# Patient Record
Sex: Male | Born: 1950 | Race: Black or African American | Hispanic: No | Marital: Single | Smoking: Former smoker
Health system: Southern US, Community
[De-identification: ages and names within clinical notes are randomized; demographics above are authoritative.]

## PROBLEM LIST (undated history)

## (undated) DIAGNOSIS — K5792 Diverticulitis of intestine, part unspecified, without perforation or abscess without bleeding: Secondary | ICD-10-CM

## (undated) DIAGNOSIS — E119 Type 2 diabetes mellitus without complications: Secondary | ICD-10-CM

## (undated) DIAGNOSIS — I10 Essential (primary) hypertension: Secondary | ICD-10-CM

## (undated) DIAGNOSIS — Z720 Tobacco use: Secondary | ICD-10-CM

## (undated) DIAGNOSIS — C801 Malignant (primary) neoplasm, unspecified: Secondary | ICD-10-CM

## (undated) DIAGNOSIS — T7840XA Allergy, unspecified, initial encounter: Secondary | ICD-10-CM

## (undated) DIAGNOSIS — Z860100 Personal history of colon polyps, unspecified: Secondary | ICD-10-CM

## (undated) DIAGNOSIS — J45909 Unspecified asthma, uncomplicated: Secondary | ICD-10-CM

## (undated) DIAGNOSIS — K219 Gastro-esophageal reflux disease without esophagitis: Secondary | ICD-10-CM

## (undated) DIAGNOSIS — J449 Chronic obstructive pulmonary disease, unspecified: Secondary | ICD-10-CM

## (undated) DIAGNOSIS — I4891 Unspecified atrial fibrillation: Secondary | ICD-10-CM

## (undated) HISTORY — DX: Tobacco use: Z72.0

## (undated) HISTORY — PX: OTHER SURGICAL HISTORY: SHX169

## (undated) HISTORY — DX: Gastro-esophageal reflux disease without esophagitis: K21.9

## (undated) HISTORY — DX: Personal history of colon polyps, unspecified: Z86.0100

## (undated) HISTORY — DX: Essential (primary) hypertension: I10

## (undated) HISTORY — DX: Personal history of colonic polyps: Z86.010

## (undated) HISTORY — PX: COLONOSCOPY: SHX174

## (undated) HISTORY — DX: Diverticulitis of intestine, part unspecified, without perforation or abscess without bleeding: K57.92

## (undated) HISTORY — DX: Chronic obstructive pulmonary disease, unspecified: J44.9

## (undated) HISTORY — DX: Allergy, unspecified, initial encounter: T78.40XA

## (undated) HISTORY — DX: Unspecified atrial fibrillation: I48.91

---

## 2007-07-30 HISTORY — PX: COLON SURGERY: SHX602

## 2007-07-30 LAB — HM COLONOSCOPY

## 2010-10-24 ENCOUNTER — Ambulatory Visit: Payer: Self-pay | Admitting: Internal Medicine

## 2010-11-08 ENCOUNTER — Ambulatory Visit: Payer: Self-pay | Admitting: Internal Medicine

## 2010-12-12 ENCOUNTER — Ambulatory Visit: Payer: Self-pay | Admitting: Internal Medicine

## 2011-01-28 ENCOUNTER — Ambulatory Visit: Payer: Self-pay | Admitting: Internal Medicine

## 2011-06-17 ENCOUNTER — Telehealth: Payer: Self-pay | Admitting: Internal Medicine

## 2011-06-26 ENCOUNTER — Ambulatory Visit (INDEPENDENT_AMBULATORY_CARE_PROVIDER_SITE_OTHER): Payer: BC Managed Care – PPO | Admitting: Internal Medicine

## 2011-06-26 ENCOUNTER — Encounter: Payer: Self-pay | Admitting: Internal Medicine

## 2011-06-26 VITALS — BP 160/90 | HR 52 | Temp 97.2°F | Resp 16 | Ht 70.0 in | Wt 200.0 lb

## 2011-06-26 DIAGNOSIS — I10 Essential (primary) hypertension: Secondary | ICD-10-CM | POA: Insufficient documentation

## 2011-06-26 DIAGNOSIS — I428 Other cardiomyopathies: Secondary | ICD-10-CM

## 2011-06-26 MED ORDER — OLMESARTAN-AMLODIPINE-HCTZ 40-10-25 MG PO TABS: 1.0000 | ORAL_TABLET | Freq: Every day | ORAL | Status: DC

## 2011-06-26 NOTE — Assessment & Plan Note (Addendum)
He tells me that he was found to have an EF= 37 % on testing done at [facility] in the last few years, it sound like his PCP ordered a stress echo +/- nuclear study, there are no records available today though he signed and we sent a records request from his providers in [place], I would like for him to be rechecked by cardiology

## 2011-06-26 NOTE — Assessment & Plan Note (Signed)
His BP is not well controlled so I have asked him to stay on toprol and to add tribenzor to that for better BP control

## 2011-06-26 NOTE — Patient Instructions (Signed)

## 2011-06-26 NOTE — Progress Notes (Signed)
  Subjective:    Patient ID: [name] [name], male    DOB: Jul 19, 1951, 60 y.o.   MRN: [id number]  Hypertension This is a chronic problem. The current episode started more than 1 year ago. The problem has been gradually worsening since onset. The problem is uncontrolled. Pertinent negatives include no anxiety, blurred vision, chest pain, headaches, malaise/fatigue, neck pain, orthopnea, palpitations, peripheral edema, PND, shortness of breath or sweats. There are no associated agents to hypertension. Past treatments include beta blockers, diuretics and angiotensin blockers. The current treatment provides mild improvement. Compliance problems include exercise and diet.       Review of Systems  Constitutional: Negative for fever, chills, malaise/fatigue, diaphoresis, activity change, appetite change, fatigue and unexpected weight change.  HENT: Negative for sore throat, facial swelling, trouble swallowing, neck pain and voice change.   Eyes: Negative.  Negative for blurred vision.  Respiratory: Negative for chest tightness, shortness of breath, wheezing and stridor.   Cardiovascular: Negative for chest pain, palpitations, orthopnea, leg swelling and PND.  Gastrointestinal: Negative for nausea, vomiting, abdominal pain, diarrhea, constipation and abdominal distention.  Genitourinary: Negative for dysuria, urgency, frequency, hematuria, flank pain, decreased urine volume, enuresis and difficulty urinating.  Musculoskeletal: Negative for myalgias, back pain, joint swelling, arthralgias and gait problem.  Skin: Negative for color change, pallor, rash and wound.  Neurological: Negative for dizziness, tremors, seizures, syncope, facial asymmetry, speech difficulty, weakness, light-headedness, numbness and headaches.  Hematological: Negative for adenopathy. Does not bruise/bleed easily.  Psychiatric/Behavioral: Negative.        Objective:   Physical Exam  Vitals reviewed. Constitutional: He is  oriented to person, place, and time. He appears well-developed and well-nourished. No distress.  HENT:  Head: Normocephalic and atraumatic.  Mouth/Throat: Oropharynx is clear and moist. No oropharyngeal exudate.  Eyes: Conjunctivae are normal. Right eye exhibits no discharge. Left eye exhibits no discharge. No scleral icterus.  Neck: Normal range of motion. Neck supple. No JVD present. No tracheal deviation present. No thyromegaly present.  Cardiovascular: Normal rate, regular rhythm, normal heart sounds and intact distal pulses.  Exam reveals no gallop and no friction rub.   No murmur heard. Pulmonary/Chest: Effort normal and breath sounds normal. No stridor. No respiratory distress. He has no wheezes. He has no rales. He exhibits no tenderness.  Abdominal: Soft. Bowel sounds are normal. He exhibits no distension and no mass. There is no tenderness. There is no rebound and no guarding.  Musculoskeletal: Normal range of motion. He exhibits no edema and no tenderness.  Lymphadenopathy:    He has no cervical adenopathy.  Neurological: He is oriented to person, place, and time.  Skin: Skin is warm and dry. No rash noted. He is not diaphoretic. No erythema. No pallor.  Psychiatric: He has a normal mood and affect. His behavior is normal. Judgment and thought content normal.          Assessment & Plan:

## 2011-07-01 ENCOUNTER — Encounter: Payer: Self-pay | Admitting: Cardiology

## 2011-07-01 DIAGNOSIS — I429 Cardiomyopathy, unspecified: Secondary | ICD-10-CM | POA: Insufficient documentation

## 2011-07-01 DIAGNOSIS — K219 Gastro-esophageal reflux disease without esophagitis: Secondary | ICD-10-CM | POA: Insufficient documentation

## 2011-07-01 DIAGNOSIS — J449 Chronic obstructive pulmonary disease, unspecified: Secondary | ICD-10-CM | POA: Insufficient documentation

## 2011-07-02 ENCOUNTER — Institutional Professional Consult (permissible substitution): Payer: BC Managed Care – PPO | Admitting: Cardiology

## 2011-07-09 ENCOUNTER — Encounter: Payer: Self-pay | Admitting: Cardiology

## 2011-08-07 ENCOUNTER — Ambulatory Visit: Payer: BC Managed Care – PPO | Admitting: Internal Medicine

## 2011-08-16 ENCOUNTER — Ambulatory Visit: Payer: BC Managed Care – PPO | Admitting: Internal Medicine

## 2011-08-27 ENCOUNTER — Other Ambulatory Visit (INDEPENDENT_AMBULATORY_CARE_PROVIDER_SITE_OTHER): Payer: BC Managed Care – PPO

## 2011-08-27 ENCOUNTER — Ambulatory Visit (INDEPENDENT_AMBULATORY_CARE_PROVIDER_SITE_OTHER)
Admission: RE | Admit: 2011-08-27 | Discharge: 2011-08-27 | Disposition: A | Discharge status: Home / Self Care | Payer: BC Managed Care – PPO | Source: Ambulatory Visit | Attending: Internal Medicine | Admitting: Internal Medicine

## 2011-08-27 ENCOUNTER — Ambulatory Visit (INDEPENDENT_AMBULATORY_CARE_PROVIDER_SITE_OTHER): Payer: BC Managed Care – PPO | Admitting: Internal Medicine

## 2011-08-27 ENCOUNTER — Encounter: Payer: Self-pay | Admitting: Internal Medicine

## 2011-08-27 DIAGNOSIS — R05 Cough: Secondary | ICD-10-CM

## 2011-08-27 DIAGNOSIS — I428 Other cardiomyopathies: Secondary | ICD-10-CM

## 2011-08-27 DIAGNOSIS — I10 Essential (primary) hypertension: Secondary | ICD-10-CM

## 2011-08-27 DIAGNOSIS — J449 Chronic obstructive pulmonary disease, unspecified: Secondary | ICD-10-CM

## 2011-08-27 DIAGNOSIS — K219 Gastro-esophageal reflux disease without esophagitis: Secondary | ICD-10-CM

## 2011-08-27 DIAGNOSIS — R059 Cough, unspecified: Secondary | ICD-10-CM

## 2011-08-27 DIAGNOSIS — F17200 Nicotine dependence, unspecified, uncomplicated: Secondary | ICD-10-CM

## 2011-08-27 DIAGNOSIS — Z720 Tobacco use: Secondary | ICD-10-CM

## 2011-08-27 LAB — URINALYSIS, ROUTINE W REFLEX MICROSCOPIC
Bilirubin Urine: NEGATIVE
Nitrite: NEGATIVE
Urobilinogen, UA: 0.2 (ref 0.0–1.0)

## 2011-08-27 LAB — CBC WITH DIFFERENTIAL/PLATELET
Eosinophils Relative: 6.6 % — ABNORMAL HIGH (ref 0.0–5.0)
HCT: 38.4 % — ABNORMAL LOW (ref 39.0–52.0)
Hemoglobin: 13 g/dL (ref 13.0–17.0)
Lymphocytes Relative: 29.8 % (ref 12.0–46.0)
Lymphs Abs: 2.2 10*3/uL (ref 0.7–4.0)
Monocytes Relative: 8.6 % (ref 3.0–12.0)
Platelets: 227 10*3/uL (ref 150.0–400.0)
WBC: 7.3 10*3/uL (ref 4.5–10.5)

## 2011-08-27 LAB — COMPREHENSIVE METABOLIC PANEL
Albumin: 3.8 g/dL (ref 3.5–5.2)
CO2: 30 mEq/L (ref 19–32)
Calcium: 8.9 mg/dL (ref 8.4–10.5)
Chloride: 106 mEq/L (ref 96–112)
GFR: 67.59 mL/min (ref >60.00)
Glucose, Bld: 150 mg/dL — ABNORMAL HIGH (ref 70–99)
Sodium: 141 mEq/L (ref 135–145)
Total Bilirubin: 0.7 mg/dL (ref 0.3–1.2)
Total Protein: 7.6 g/dL (ref 6.0–8.3)

## 2011-08-27 LAB — LIPID PANEL
Total CHOL/HDL Ratio: 4
Triglycerides: 250 mg/dL — ABNORMAL HIGH (ref 0.0–149.0)

## 2011-08-27 MED ORDER — LOSARTAN POTASSIUM-HCTZ 100-25 MG PO TABS: 1.0000 | ORAL_TABLET | Freq: Every day | ORAL | Status: DC

## 2011-08-27 NOTE — Progress Notes (Signed)
Subjective:    Patient ID: [name] [name], male    DOB: 10-Nov-1950, 61 y.o.   MRN: [id number]  Hypertension This is a chronic problem. The current episode started more than 1 year ago. The problem has been gradually improving since onset. The problem is controlled. Pertinent negatives include no anxiety, blurred vision, chest pain, headaches, malaise/fatigue, neck pain, orthopnea, palpitations, peripheral edema, PND, shortness of breath or sweats. There are no associated agents to hypertension. Past treatments include angiotensin blockers, beta blockers and diuretics. The current treatment provides moderate improvement. Compliance problems include exercise and diet.   Cough This is a chronic problem. The current episode started more than 1 month ago. The problem has been unchanged. The problem occurs every few hours. The cough is non-productive. Pertinent negatives include no chest pain, chills, ear congestion, ear pain, fever, headaches, heartburn, hemoptysis, myalgias, nasal congestion, postnasal drip, rash, rhinorrhea, sore throat, shortness of breath, sweats, weight loss or wheezing. Risk factors for lung disease include smoking/tobacco exposure. He has tried nothing for the symptoms. His past medical history is significant for COPD.      Review of Systems  Constitutional: Negative for fever, chills, weight loss, malaise/fatigue, diaphoresis, activity change, appetite change, fatigue and unexpected weight change.  HENT: Negative.  Negative for ear pain, sore throat, rhinorrhea, neck pain and postnasal drip.   Eyes: Negative.  Negative for blurred vision.  Respiratory: Positive for cough. Negative for apnea, hemoptysis, choking, chest tightness, shortness of breath, wheezing and stridor.   Cardiovascular: Negative for chest pain, palpitations, orthopnea, leg swelling and PND.  Gastrointestinal: Negative for heartburn, nausea, vomiting, abdominal pain, diarrhea, constipation and anal bleeding.    Genitourinary: Negative.   Musculoskeletal: Negative for myalgias, back pain, joint swelling, arthralgias and gait problem.  Skin: Negative for color change, pallor, rash and wound.  Neurological: Negative for dizziness, tremors, seizures, syncope, facial asymmetry, speech difficulty, weakness, light-headedness, numbness and headaches.  Hematological: Negative for adenopathy. Does not bruise/bleed easily.  Psychiatric/Behavioral: Negative.        Objective:   Physical Exam  Vitals reviewed. Constitutional: He is oriented to person, place, and time. He appears well-developed and well-nourished. No distress.  HENT:  Head: Normocephalic and atraumatic.  Mouth/Throat: Oropharynx is clear and moist. No oropharyngeal exudate.  Eyes: Conjunctivae are normal. Right eye exhibits no discharge. Left eye exhibits no discharge. No scleral icterus.  Neck: Normal range of motion. Neck supple. No JVD present. No tracheal deviation present. No thyromegaly present.  Cardiovascular: Normal rate, regular rhythm, normal heart sounds and intact distal pulses.  Exam reveals no gallop and no friction rub.   No murmur heard. Pulmonary/Chest: Effort normal and breath sounds normal. No stridor. No respiratory distress. He has no wheezes. He has no rales. He exhibits no tenderness.  Abdominal: Soft. Bowel sounds are normal. He exhibits no distension and no mass. There is no tenderness. There is no rebound and no guarding.  Musculoskeletal: Normal range of motion. He exhibits no edema and no tenderness.  Lymphadenopathy:    He has no cervical adenopathy.  Neurological: He is oriented to person, place, and time.  Skin: Skin is warm and dry. No rash noted. He is not diaphoretic. No erythema. No pallor.  Psychiatric: He has a normal mood and affect. His behavior is normal. Judgment and thought content normal.      Lab Results  Component Value Date   WBC 7.3 08/27/2011   HGB 13.0 08/27/2011   HCT 38.4* 08/27/2011    PLT  227.0 08/27/2011   GLUCOSE 150* 08/27/2011   CHOL 168 08/27/2011   TRIG 250.0* 08/27/2011   HDL 38.60* 08/27/2011   LDLDIRECT 98.4 08/27/2011   ALT 15 08/27/2011   AST 18 08/27/2011   NA 141 08/27/2011   K 3.7 08/27/2011   CL 106 08/27/2011   CREATININE 1.4 08/27/2011   BUN 18 08/27/2011   CO2 30 08/27/2011   TSH 1.64 08/27/2011      Assessment & Plan:

## 2011-08-27 NOTE — Patient Instructions (Signed)
Smoking Cessation This document explains the best ways for you to quit smoking and new treatments to help. It lists new medicines that can double or triple your chances of quitting and quitting for good. It also considers ways to avoid relapses and concerns you may have about quitting, including weight gain. NICOTINE: A POWERFUL ADDICTION If you have tried to quit smoking, you know how hard it can be. It is hard because nicotine is a very addictive drug. For some people, it can be as addictive as heroin or cocaine. Usually, people make 2 or 3 tries, or more, before finally being able to quit. Each time you try to quit, you can learn about what helps and what hurts. Quitting takes hard work and a lot of effort, but you can quit smoking. QUITTING SMOKING IS ONE OF THE MOST IMPORTANT THINGS YOU WILL EVER DO.  You will live longer, feel better, and live better.   The impact on your body of quitting smoking is felt almost immediately:   Within 20 minutes, blood pressure decreases. Pulse returns to its normal level.   After 8 hours, carbon monoxide levels in the blood return to normal. Oxygen level increases.   After 24 hours, chance of heart attack starts to decrease. Breath, hair, and body stop smelling like smoke.   After 48 hours, damaged nerve endings begin to recover. Sense of taste and smell improve.   After 72 hours, the body is virtually free of nicotine. Bronchial tubes relax and breathing becomes easier.   After 2 to 12 weeks, lungs can hold more air. Exercise becomes easier and circulation improves.   Quitting will reduce your risk of having a heart attack, stroke, cancer, or lung disease:   After 1 year, the risk of coronary heart disease is cut in half.   After 5 years, the risk of stroke falls to the same as a nonsmoker.   After 10 years, the risk of lung cancer is cut in half and the risk of other cancers decreases significantly.   After 15 years, the risk of coronary heart  disease drops, usually to the level of a nonsmoker.   If you are pregnant, quitting smoking will improve your chances of having a healthy baby.   The people you live with, especially your children, will be healthier.   You will have extra money to spend on things other than cigarettes.  FIVE KEYS TO QUITTING Studies have shown that these 5 steps will help you quit smoking and quit for good. You have the best chances of quitting if you use them together: 1. Get ready.  2. Get support and encouragement.  3. Learn new skills and behaviors.  4. Get medicine to reduce your nicotine addiction and use it correctly.  5. Be prepared for relapse or difficult situations. Be determined to continue trying to quit, even if you do not succeed at first.  1. GET READY  Set a quit date.   Change your environment.   Get rid of ALL cigarettes, ashtrays, matches, and lighters in your home, car, and place of work.   Do not let people smoke in your home.   Review your past attempts to quit. Think about what worked and what did not.   Once you quit, do not smoke. NOT EVEN A PUFF!  2. GET SUPPORT AND ENCOURAGEMENT Studies have shown that you have a better chance of being successful if you have help. You can get support in many ways.  Tell   your family, friends, and coworkers that you are going to quit and need their support. Ask them not to smoke around you.   Talk to your caregivers (doctor, dentist, nurse, pharmacist, psychologist, and/or smoking counselor).   Get individual, group, or telephone counseling and support. The more counseling you have, the better your chances are of quitting. Programs are available at local hospitals and health centers. Call your local health department for information about programs in your area.   Spiritual beliefs and practices may help some smokers quit.   Quit meters are small computer programs online or downloadable that keep track of quit statistics, such as amount  of "quit-time," cigarettes not smoked, and money saved.   Many smokers find one or more of the many self-help books available useful in helping them quit and stay off tobacco.  3. LEARN NEW SKILLS AND BEHAVIORS  Try to distract yourself from urges to smoke. Talk to someone, go for a walk, or occupy your time with a task.   When you first try to quit, change your routine. Take a different route to work. Drink tea instead of coffee. Eat breakfast in a different place.   Do something to reduce your stress. Take a hot bath, exercise, or read a book.   Plan something enjoyable to do every day. Reward yourself for not smoking.   Explore interactive web-based programs that specialize in helping you quit.  4. GET MEDICINE AND USE IT CORRECTLY Medicines can help you stop smoking and decrease the urge to smoke. Combining medicine with the above behavioral methods and support can quadruple your chances of successfully quitting smoking. The [place] Food and Drug Administration (FDA) has approved 7 medicines to help you quit smoking. These medicines fall into 3 categories.  Nicotine replacement therapy (delivers nicotine to your body without the negative effects and risks of smoking):   Nicotine gum: Available over-the-counter.   Nicotine lozenges: Available over-the-counter.   Nicotine inhaler: Available by prescription.   Nicotine nasal spray: Available by prescription.   Nicotine skin patches (transdermal): Available by prescription and over-the-counter.   Antidepressant medicine (helps people abstain from smoking, but how this works is unknown):   Bupropion sustained-release (SR) tablets: Available by prescription.   Nicotinic receptor partial agonist (simulates the effect of nicotine in your brain):   Varenicline tartrate tablets: Available by prescription.   Ask your caregiver for advice about which medicines to use and how to use them. Carefully read the information on the package.    Everyone who is trying to quit may benefit from using a medicine. If you are pregnant or trying to become pregnant, nursing an infant, you are under age 18, or you smoke fewer than 10 cigarettes per day, talk to your caregiver before taking any nicotine replacement medicines.   You should stop using a nicotine replacement product and call your caregiver if you experience nausea, dizziness, weakness, vomiting, fast or irregular heartbeat, mouth problems with the lozenge or gum, or redness or swelling of the skin around the patch that does not go away.   Do not use any other product containing nicotine while using a nicotine replacement product.   Talk to your caregiver before using these products if you have diabetes, heart disease, asthma, stomach ulcers, you had a recent heart attack, you have high blood pressure that is not controlled with medicine, a history of irregular heartbeat, or you have been prescribed medicine to help you quit smoking.  5. BE PREPARED FOR RELAPSE OR   DIFFICULT SITUATIONS  Most relapses occur within the first 3 months after quitting. Do not be discouraged if you start smoking again. Remember, most people try several times before they finally quit.   You may have symptoms of withdrawal because your body is used to nicotine. You may crave cigarettes, be irritable, feel very hungry, cough often, get headaches, or have difficulty concentrating.   The withdrawal symptoms are only temporary. They are strongest when you first quit, but they will go away within 10 to 14 days.  Here are some difficult situations to watch for:  Alcohol. Avoid drinking alcohol. Drinking lowers your chances of successfully quitting.   Caffeine. Try to reduce the amount of caffeine you consume. It also lowers your chances of successfully quitting.   Other smokers. Being around smoking can make you want to smoke. Avoid smokers.   Weight gain. Many smokers will gain weight when they quit, usually  less than 10 pounds. Eat a healthy diet and stay active. Do not let weight gain distract you from your main goal, quitting smoking. Some medicines that help you quit smoking may also help delay weight gain. You can always lose the weight gained after you quit.   Bad mood or depression. There are a lot of ways to improve your mood other than smoking.  If you are having problems with any of these situations, talk to your caregiver. SPECIAL SITUATIONS AND CONDITIONS Studies suggest that everyone can quit smoking. Your situation or condition can give you a special reason to quit.  Pregnant women/new mothers: By quitting, you protect your baby's health and your own.   Hospitalized patients: By quitting, you reduce health problems and help healing.   Heart attack patients: By quitting, you reduce your risk of a second heart attack.   Lung, head, and neck cancer patients: By quitting, you reduce your chance of a second cancer.   Parents of children and adolescents: By quitting, you protect your children from illnesses caused by secondhand smoke.  QUESTIONS TO THINK ABOUT Think about the following questions before you try to stop smoking. You may want to talk about your answers with your caregiver.  Why do you want to quit?   If you tried to quit in the past, what helped and what did not?   What will be the most difficult situations for you after you quit? How will you plan to handle them?   Who can help you through the tough times? Your family? Friends? Caregiver?   What pleasures do you get from smoking? What ways can you still get pleasure if you quit?  Here are some questions to ask your caregiver:  How can you help me to be successful at quitting?   What medicine do you think would be best for me and how should I take it?   What should I do if I need more help?   What is smoking withdrawal like? How can I get information on withdrawal?  Quitting takes hard work and a lot of effort,  but you can quit smoking. FOR MORE INFORMATION  Smokefree.gov (http://www.smokefree.gov) provides free, accurate, evidence-based information and professional assistance to help support the immediate and long-term needs of people trying to quit smoking. Document Released: 07/09/2001 Document Revised: 03/27/2011 Document Reviewed: 05/01/2009 [employer] Patient Information 2012 [employer]. Hypertension As your heart beats, it forces blood through your arteries. This force is your blood pressure. If the pressure is too high, it is called hypertension (HTN) or high blood   pressure. HTN is dangerous because you may have it and not know it. High blood pressure may mean that your heart has to work harder to pump blood. Your arteries may be narrow or stiff. The extra work puts you at risk for heart disease, stroke, and other problems.  Blood pressure consists of two numbers, a higher number over a lower, 110/72, for example. It is stated as "110 over 72." The ideal is below 120 for the top number (systolic) and under 80 for the bottom (diastolic). Write down your blood pressure today. You should pay close attention to your blood pressure if you have certain conditions such as:  Heart failure.   Prior heart attack.   Diabetes   Chronic kidney disease.   Prior stroke.   Multiple risk factors for heart disease.  To see if you have HTN, your blood pressure should be measured while you are seated with your arm held at the level of the heart. It should be measured at least twice. A one-time elevated blood pressure reading (especially in the Emergency Department) does not mean that you need treatment. There may be conditions in which the blood pressure is different between your right and left arms. It is important to see your caregiver soon for a recheck. Most people have essential hypertension which means that there is not a specific cause. This type of high blood pressure may be lowered by changing  lifestyle factors such as:  Stress.   Smoking.   Lack of exercise.   Excessive weight.   Drug/tobacco/alcohol use.   Eating less salt.  Most people do not have symptoms from high blood pressure until it has caused damage to the body. Effective treatment can often prevent, delay or reduce that damage. TREATMENT  When a cause has been identified, treatment for high blood pressure is directed at the cause. There are a large number of medications to treat HTN. These fall into several categories, and your caregiver will help you select the medicines that are best for you. Medications may have side effects. You should review side effects with your caregiver. If your blood pressure stays high after you have made lifestyle changes or started on medicines,   Your medication(s) may need to be changed.   Other problems may need to be addressed.   Be certain you understand your prescriptions, and know how and when to take your medicine.   Be sure to follow up with your caregiver within the time frame advised (usually within two weeks) to have your blood pressure rechecked and to review your medications.   If you are taking more than one medicine to lower your blood pressure, make sure you know how and at what times they should be taken. Taking two medicines at the same time can result in blood pressure that is too low.  SEEK IMMEDIATE MEDICAL CARE IF:  You develop a severe headache, blurred or changing vision, or confusion.   You have unusual weakness or numbness, or a faint feeling.   You have severe chest or abdominal pain, vomiting, or breathing problems.  MAKE SURE YOU:   Understand these instructions.   Will watch your condition.   Will get help right away if you are not doing well or get worse.  Document Released: 07/15/2005 Document Revised: 03/27/2011 Document Reviewed: 03/04/2008 Exit[employer] Patient Information 2012 [employer]. 

## 2011-08-28 ENCOUNTER — Telehealth: Payer: Self-pay | Admitting: Internal Medicine

## 2011-08-28 NOTE — Telephone Encounter (Signed)
Received copies from Dr. Eulas Post at [facility] 08/28/11. Forwarded  68pages to Dr. Leretha Dykes review.

## 2011-08-29 ENCOUNTER — Telehealth: Payer: Self-pay

## 2011-08-29 MED ORDER — METOPROLOL SUCCINATE ER 50 MG PO TB24: 50.0000 mg | ORAL_TABLET | Freq: Every day | ORAL | Status: DC

## 2011-08-29 MED ORDER — OMEPRAZOLE 20 MG PO CPDR: 20.0000 mg | DELAYED_RELEASE_CAPSULE | Freq: Every day | ORAL | Status: DC

## 2011-08-29 NOTE — Assessment & Plan Note (Signed)
I will check a CXR today to look for mass, pna, edema, etc.

## 2011-08-29 NOTE — Telephone Encounter (Signed)
Patient called [facility] needing to know if medical records and been received. Returned call to patient and advised yes. Patient then request rx for omeprazole 20 qd and metoprolol 50 mg qd to be sent to [facility]

## 2011-08-29 NOTE — Assessment & Plan Note (Signed)
He agrees to use nicotine patches to quit smoking 

## 2011-08-29 NOTE — Assessment & Plan Note (Signed)
BP is well controlled, I will check his lytes and renal function 

## 2011-08-29 NOTE — Assessment & Plan Note (Signed)
He does not want to start using inhalers but he does agree to quit smoking

## 2011-08-30 ENCOUNTER — Institutional Professional Consult (permissible substitution): Payer: BC Managed Care – PPO | Admitting: Cardiology

## 2011-10-02 ENCOUNTER — Encounter: Payer: Self-pay | Admitting: Cardiology

## 2011-10-03 ENCOUNTER — Institutional Professional Consult (permissible substitution): Payer: BC Managed Care – PPO | Admitting: Cardiology

## 2011-11-26 ENCOUNTER — Ambulatory Visit: Payer: BC Managed Care – PPO | Admitting: Internal Medicine

## 2011-12-03 ENCOUNTER — Ambulatory Visit: Payer: BC Managed Care – PPO | Admitting: Internal Medicine

## 2012-01-06 ENCOUNTER — Other Ambulatory Visit: Payer: Self-pay | Admitting: Internal Medicine

## 2012-01-21 ENCOUNTER — Telehealth: Payer: Self-pay

## 2012-01-21 MED ORDER — OMEPRAZOLE 20 MG PO CPDR: DELAYED_RELEASE_CAPSULE | ORAL | Status: DC

## 2012-01-21 NOTE — Telephone Encounter (Signed)
Received fax from pharmacy stating that insurance will only cover 90 day supply

## 2012-04-20 ENCOUNTER — Ambulatory Visit: Payer: BC Managed Care – PPO | Admitting: Internal Medicine

## 2012-04-20 DIAGNOSIS — Z0289 Encounter for other administrative examinations: Secondary | ICD-10-CM

## 2012-05-01 ENCOUNTER — Emergency Department (HOSPITAL_COMMUNITY)
Admission: EM | Admit: 2012-05-01 | Discharge: 2012-05-01 | Disposition: A | Discharge status: Home / Self Care | Payer: BC Managed Care – PPO | Attending: Emergency Medicine | Admitting: Emergency Medicine

## 2012-05-01 ENCOUNTER — Emergency Department (HOSPITAL_COMMUNITY): Payer: BC Managed Care – PPO

## 2012-05-01 ENCOUNTER — Encounter (HOSPITAL_COMMUNITY): Payer: Self-pay | Admitting: Emergency Medicine

## 2012-05-01 DIAGNOSIS — J189 Pneumonia, unspecified organism: Secondary | ICD-10-CM | POA: Insufficient documentation

## 2012-05-01 DIAGNOSIS — J45901 Unspecified asthma with (acute) exacerbation: Secondary | ICD-10-CM

## 2012-05-01 DIAGNOSIS — I10 Essential (primary) hypertension: Secondary | ICD-10-CM | POA: Insufficient documentation

## 2012-05-01 DIAGNOSIS — E876 Hypokalemia: Secondary | ICD-10-CM | POA: Insufficient documentation

## 2012-05-01 DIAGNOSIS — R0602 Shortness of breath: Secondary | ICD-10-CM | POA: Insufficient documentation

## 2012-05-01 HISTORY — DX: Unspecified asthma, uncomplicated: J45.909

## 2012-05-01 LAB — BASIC METABOLIC PANEL
Calcium: 9.8 mg/dL (ref 8.4–10.5)
GFR calc Af Amer: 70 mL/min — ABNORMAL LOW (ref >90)
GFR calc non Af Amer: 61 mL/min — ABNORMAL LOW (ref >90)
Glucose, Bld: 140 mg/dL — ABNORMAL HIGH (ref 70–99)
Potassium: 2.6 mEq/L — CL (ref 3.5–5.1)
Sodium: 137 mEq/L (ref 135–145)

## 2012-05-01 LAB — CBC WITH DIFFERENTIAL/PLATELET
Basophils Absolute: 0 10*3/uL (ref 0.0–0.1)
Basophils Relative: 0 % (ref 0–1)
Eosinophils Absolute: 0.4 10*3/uL (ref 0.0–0.7)
Eosinophils Relative: 5 % (ref 0–5)
Lymphs Abs: 2.3 10*3/uL (ref 0.7–4.0)
MCH: 32.2 pg (ref 26.0–34.0)
MCV: 95.6 fL (ref 78.0–100.0)
Neutrophils Relative %: 64 % (ref 43–77)
Platelets: 249 10*3/uL (ref 150–400)
RBC: 4.54 MIL/uL (ref 4.22–5.81)
RDW: 13.4 % (ref 11.5–15.5)

## 2012-05-01 LAB — TROPONIN I: Troponin I: <0.3 ng/mL (ref <0.30)

## 2012-05-01 MED ORDER — IPRATROPIUM BROMIDE 0.02 % IN SOLN
RESPIRATORY_TRACT | Status: AC
  Administered 2012-05-01: 0.5 mg
  Filled 2012-05-01: qty 2.5

## 2012-05-01 MED ORDER — ALBUTEROL SULFATE (5 MG/ML) 0.5% IN NEBU
INHALATION_SOLUTION | RESPIRATORY_TRACT | Status: AC
  Administered 2012-05-01: 5 mg
  Filled 2012-05-01: qty 1

## 2012-05-01 MED ORDER — PREDNISONE 20 MG PO TABS
40.0000 mg | ORAL_TABLET | Freq: Once | ORAL | Status: AC
  Administered 2012-05-01: 40 mg via ORAL
  Filled 2012-05-01: qty 2

## 2012-05-01 MED ORDER — POTASSIUM CHLORIDE CRYS ER 20 MEQ PO TBCR: 20.0000 meq | EXTENDED_RELEASE_TABLET | Freq: Two times a day (BID) | ORAL | Status: DC

## 2012-05-01 MED ORDER — AZITHROMYCIN 250 MG PO TABS: 250.0000 mg | ORAL_TABLET | Freq: Every day | ORAL | Status: DC

## 2012-05-01 MED ORDER — PREDNISONE 20 MG PO TABS: 40.0000 mg | ORAL_TABLET | Freq: Every day | ORAL | Status: DC

## 2012-05-01 NOTE — ED Notes (Signed)
MD at bedside. 

## 2012-05-01 NOTE — ED Notes (Signed)
Patient reports asthma exacerbation that started two hours ago; patient reports that his albuterol inhaler has not been helping.  Paramedics called out to home yesterday for another asthma attack.  Audible wheezing heard in triage.

## 2012-05-01 NOTE — ED Provider Notes (Addendum)
History     CSN: [id number]  Arrival date & time 05/01/12  8413   First MD Initiated Contact with Patient 05/01/12 [phone]      Chief Complaint  Patient presents with  . Asthma    (Consider location/radiation/quality/duration/timing/severity/associated sxs/prior treatment) HPI Comments: Pt has hx of Non ischemic cardiomyopathy and hx of COPD with long history of smoking who presents with recurrent SOB and wheezing.  He states that when he went to bed last night he was having mild wheezing but awoke this morning with increased cough and wheeze.  He denies fevers, CP, cough, swelling or headache.  Has no sore throat or congestion.  Sx are persistent and didn't improve much with MDI tx prior to arrival but felt better after EMS neb. According to the MR the pt had recent dx of COPD with suggestion of using MDI as of earlier this year.  He has needed intermittent MDI's over the last 9 months.  Patient is a 61 y.o. male presenting with asthma. The history is provided by the patient and medical records.  Asthma    Past Medical History  Diagnosis Date  . GERD (gastroesophageal reflux disease)   . Allergy   . Diverticulitis   . History of colon polyps   . COPD (chronic obstructive pulmonary disease)   . Cardiomyopathy 2011    Patient was told that he had an ejection fraction of 37% by echo done elsewhere in 2011  . Hypertension   . Tobacco abuse   . Asthma     Past Surgical History  Procedure Date  . Colon surgery 2009    Family History  Problem Relation Age of Onset  . Arthritis Other     parents  . Cancer Other     Colon,Breast, Prostate Cancer  . Hypertension Other   . Diabetes Other     parents  . Colon cancer Mother   . Prostate cancer Father     History  Substance Use Topics  . Smoking status: Current Every Day Smoker -- 0.5 packs/day for 25 years    Types: Cigarettes  . Smokeless tobacco: Never Used  . Alcohol Use: 7.2 oz/week    12 Shots of liquor per week       Review of Systems  All other systems reviewed and are negative.    Allergies  Lisinopril; Penicillins; and Tribenzor  Home Medications   Current Outpatient Rx  Name Route Sig Dispense Refill  . ASPIRIN 81 MG PO TABS Oral Take 81 mg by mouth daily.      . OMEGA-3 FATTY ACIDS 1000 MG PO CAPS Oral Take 2 g by mouth daily.      Marland Kitchen HYDROCHLOROTHIAZIDE 25 MG PO TABS Oral Take 25 mg by mouth daily.    Marland Kitchen LOSARTAN POTASSIUM 100 MG PO TABS Oral Take 100 mg by mouth daily.    Marland Kitchen METOPROLOL SUCCINATE ER 50 MG PO TB24 Oral Take 1 tablet (50 mg total) by mouth daily. Take with or immediately following a meal. 30 tablet 11  . ONE-DAILY MULTI VITAMINS PO TABS Oral Take 1 tablet by mouth daily.      Marland Kitchen OMEPRAZOLE 20 MG PO CPDR Oral Take 20 mg by mouth daily.    . AZITHROMYCIN 250 MG PO TABS Oral Take 1 tablet (250 mg total) by mouth daily. 500mg  PO day 1, then 250mg  PO days 205 6 tablet 0  . POTASSIUM CHLORIDE CRYS ER 20 MEQ PO TBCR Oral Take 1 tablet (20 mEq  total) by mouth 2 (two) times daily. 20 tablet 0  . PREDNISONE 20 MG PO TABS Oral Take 2 tablets (40 mg total) by mouth daily. 10 tablet 0    BP 173/86  Pulse 82  Temp 98.2 F (36.8 C) (Oral)  Resp 24  SpO2 94%  Physical Exam  Nursing note and vitals reviewed. Constitutional: He appears well-developed and well-nourished. No distress.  HENT:  Head: Normocephalic and atraumatic.  Mouth/Throat: Oropharynx is clear and moist. No oropharyngeal exudate.  Eyes: Conjunctivae normal and EOM are normal. Pupils are equal, round, and reactive to light. Right eye exhibits no discharge. Left eye exhibits no discharge. No scleral icterus.  Neck: Normal range of motion. Neck supple. No JVD present. No thyromegaly present.  Cardiovascular: Normal rate, regular rhythm, normal heart sounds and intact distal pulses.  Exam reveals no gallop and no friction rub.   No murmur heard. Pulmonary/Chest: Effort normal. No respiratory distress. He has wheezes  ( mild end expiratory wheezing). He has no rales.  Abdominal: Soft. Bowel sounds are normal. He exhibits no distension and no mass. There is no tenderness.  Musculoskeletal: Normal range of motion. He exhibits no edema and no tenderness.  Lymphadenopathy:    He has no cervical adenopathy.  Neurological: He is alert. Coordination normal.  Skin: Skin is warm and dry. No rash noted. No erythema.  Psychiatric: He has a normal mood and affect. His behavior is normal.    ED Course  Procedures (including critical care time)  Labs Reviewed  PRO B NATRIURETIC PEPTIDE - Abnormal; Notable for the following:    Pro B Natriuretic peptide (BNP) 2736.0 (*)     All other components within normal limits  BASIC METABOLIC PANEL - Abnormal; Notable for the following:    Potassium 2.6 (*)     Glucose, Bld 140 (*)     GFR calc non Af Amer 61 (*)     GFR calc Af Amer 70 (*)     All other components within normal limits  CBC WITH DIFFERENTIAL  TROPONIN I   Dg Chest 2 View  05/01/2012  *RADIOLOGY REPORT*  Clinical Data: Asthma  CHEST - 2 VIEW  Comparison: 08/27/2011  Findings: Heart size upper normal.  Aortic arch atherosclerosis. Mild lingular opacity.  Mild interstitial coarsening/bronchitic change.  No pleural effusion or pneumothorax.  No acute osseous finding.  IMPRESSION: Heart size upper normal.  Mild bronchitic change may reflect sequelae of smoking or asthma.  Mild lingular opacity is nonspecific; scarring, atelectasis, or infiltrate.   Original Report Authenticated By: Waneta Martins, M.D.      1. Asthma exacerbation   2. Community acquired pneumonia   3. Hypokalemia       MDM  Currently the pt is speaking in full sentences, has mild end expiratory wheezing after 2 nebs.  Possibility of cardiac wheeze, check CXR and BNP, no JVD and no peripheral edema.  ECG is unremarkable but no old ECG with which to compare.  ED ECG REPORT  I personally interpreted this EKG   Date: 05/01/2012    Rate: 80  Rhythm: normal sinus rhythm  QRS Axis: left  Intervals: QT prolonged  ST/T Wave abnormalities: LVH with repolarization abnormality  Conduction Disutrbances:nonspecific intraventricular conduction delay  Narrative Interpretation:   Old EKG Reviewed: none available   I have had a long discussion with the patient and his friend whom he lives with re: the cause of his SOB.  I see no pulmonary edema on the  xray and the ECG shows no acute ischemia though there is some LVH.  I have reviewed the MR and find no old Echo's and no stress tests though he reports that in the last 3 years he had a stress echo followed by a cath that showed non ischemic cardiomyopathy and non obstructive CAD.    I have informed him several times very clearly that my recommendation is admission for Echo and further testing and he has declined stating he wants to pursue this as outpt - he has improved with meds and is amenable to f/u and has appt in 4 days with PMD.  He has expressed his understanding that this may be his heart but is adamant about not being admitted.    Will treat as outpt at pts request - albuterol, zithromax, prednisone, potassium.  Pt also has prolonged QT but wants to be d/c.  He is aware that he should come back if sx worsen.     Vida Roller, MD 05/01/12 [id number]  Vida Roller, MD 05/01/12 [phone]

## 2012-05-01 NOTE — ED Notes (Signed)
Pt discharged home. Had no further questions at the time. Vital signs stable. Pt encouraged to follow up with PCP.

## 2012-05-05 ENCOUNTER — Ambulatory Visit (INDEPENDENT_AMBULATORY_CARE_PROVIDER_SITE_OTHER)
Admission: RE | Admit: 2012-05-05 | Discharge: 2012-05-05 | Disposition: A | Discharge status: Home / Self Care | Payer: BC Managed Care – PPO | Source: Ambulatory Visit | Attending: Internal Medicine | Admitting: Internal Medicine

## 2012-05-05 ENCOUNTER — Other Ambulatory Visit (INDEPENDENT_AMBULATORY_CARE_PROVIDER_SITE_OTHER): Payer: BC Managed Care – PPO

## 2012-05-05 ENCOUNTER — Encounter: Payer: Self-pay | Admitting: Internal Medicine

## 2012-05-05 ENCOUNTER — Ambulatory Visit (INDEPENDENT_AMBULATORY_CARE_PROVIDER_SITE_OTHER): Payer: BC Managed Care – PPO | Admitting: Internal Medicine

## 2012-05-05 VITALS — BP 146/82 | HR 66 | Temp 98.4°F | Resp 16 | Wt 203.5 lb

## 2012-05-05 DIAGNOSIS — I429 Cardiomyopathy, unspecified: Secondary | ICD-10-CM

## 2012-05-05 DIAGNOSIS — R7309 Other abnormal glucose: Secondary | ICD-10-CM | POA: Insufficient documentation

## 2012-05-05 DIAGNOSIS — I10 Essential (primary) hypertension: Secondary | ICD-10-CM

## 2012-05-05 DIAGNOSIS — E782 Mixed hyperlipidemia: Secondary | ICD-10-CM

## 2012-05-05 DIAGNOSIS — E785 Hyperlipidemia, unspecified: Secondary | ICD-10-CM | POA: Insufficient documentation

## 2012-05-05 DIAGNOSIS — E876 Hypokalemia: Secondary | ICD-10-CM

## 2012-05-05 DIAGNOSIS — R05 Cough: Secondary | ICD-10-CM

## 2012-05-05 DIAGNOSIS — J449 Chronic obstructive pulmonary disease, unspecified: Secondary | ICD-10-CM

## 2012-05-05 DIAGNOSIS — I428 Other cardiomyopathies: Secondary | ICD-10-CM

## 2012-05-05 LAB — BASIC METABOLIC PANEL
Calcium: 9 mg/dL (ref 8.4–10.5)
GFR: 80.91 mL/min (ref >60.00)
Potassium: 3.8 mEq/L (ref 3.5–5.1)
Sodium: 137 mEq/L (ref 135–145)

## 2012-05-05 LAB — HEMOGLOBIN A1C: Hgb A1c MFr Bld: 7.1 % — ABNORMAL HIGH (ref 4.6–6.5)

## 2012-05-05 LAB — LIPID PANEL
Cholesterol: 183 mg/dL (ref 0–200)
Total CHOL/HDL Ratio: 4
Triglycerides: 247 mg/dL — ABNORMAL HIGH (ref 0.0–149.0)

## 2012-05-05 LAB — MAGNESIUM: Magnesium: 1.7 mg/dL (ref 1.5–2.5)

## 2012-05-05 MED ORDER — BUDESONIDE-FORMOTEROL FUMARATE 160-4.5 MCG/ACT IN AERO: 2.0000 | INHALATION_SPRAY | Freq: Two times a day (BID) | RESPIRATORY_TRACT | Status: DC

## 2012-05-05 MED ORDER — TRIAMTERENE-HCTZ 37.5-25 MG PO CAPS: 1.0000 | ORAL_CAPSULE | ORAL | Status: DC

## 2012-05-05 MED ORDER — OLMESARTAN MEDOXOMIL 40 MG PO TABS: 40.0000 mg | ORAL_TABLET | Freq: Every day | ORAL | Status: DC

## 2012-05-05 NOTE — Assessment & Plan Note (Signed)
He will try to quit smoking, I have asked him to start Symbicort

## 2012-05-05 NOTE — Progress Notes (Signed)
Subjective:    Patient ID: [name] [name], male    DOB: [birth year], 61 y.o.   MRN: [id number]  Hypertension This is a chronic problem. The current episode started more than 1 year ago. The problem has been gradually worsening since onset. The problem is uncontrolled. Associated symptoms include malaise/fatigue and shortness of breath. Pertinent negatives include no anxiety, blurred vision, chest pain, headaches, neck pain, orthopnea, palpitations, peripheral edema, PND or sweats. Past treatments include diuretics, beta blockers and angiotensin blockers. The current treatment provides mild improvement. Compliance problems include exercise and diet.  Hypertensive end-organ damage includes heart failure and left ventricular hypertrophy.  Asthma He complains of cough, shortness of breath, sputum production and wheezing. There is no chest tightness, difficulty breathing, frequent throat clearing, hemoptysis or hoarse voice. This is a recurrent problem. The current episode started more than 1 month ago. The problem occurs intermittently. The problem has been gradually improving. The cough is productive of purulent sputum. Associated symptoms include malaise/fatigue. Pertinent negatives include no appetite change, chest pain, dyspnea on exertion, ear congestion, ear pain, fever, headaches, heartburn, myalgias, nasal congestion, orthopnea, PND, postnasal drip, rhinorrhea, sneezing, sore throat, sweats, trouble swallowing or weight loss. His symptoms are aggravated by pollen. His symptoms are alleviated by beta-agonist and oral steroids. He reports moderate improvement on treatment. His past medical history is significant for asthma and COPD.      Review of Systems  Constitutional: Positive for malaise/fatigue. Negative for fever, chills, weight loss, diaphoresis, activity change, appetite change, fatigue and unexpected weight change.  HENT: Negative.  Negative for ear pain, nosebleeds, congestion, sore  throat, hoarse voice, facial swelling, rhinorrhea, sneezing, trouble swallowing, neck pain, voice change and postnasal drip.   Eyes: Negative.  Negative for blurred vision.  Respiratory: Positive for cough, sputum production, shortness of breath and wheezing. Negative for apnea, hemoptysis, choking, chest tightness and stridor.   Cardiovascular: Negative for chest pain, dyspnea on exertion, palpitations, orthopnea and PND.  Gastrointestinal: Negative for heartburn, nausea, vomiting, abdominal pain, diarrhea and constipation.  Genitourinary: Negative.   Musculoskeletal: Negative for myalgias, back pain, joint swelling, arthralgias and gait problem.  Skin: Negative for color change, pallor, rash and wound.  Neurological: Negative.  Negative for headaches.  Hematological: Negative for adenopathy. Does not bruise/bleed easily.  Psychiatric/Behavioral: Negative.        Objective:   Physical Exam  Vitals reviewed. Constitutional: He is oriented to person, place, and time. He appears well-developed and well-nourished.  Non-toxic appearance. He does not have a sickly appearance. He does not appear ill. No distress.  HENT:  Head: Normocephalic and atraumatic.  Mouth/Throat: Oropharynx is clear and moist. No oropharyngeal exudate.  Eyes: Conjunctivae normal are normal. Right eye exhibits no discharge. Left eye exhibits no discharge. No scleral icterus.  Neck: Normal range of motion. Neck supple. No JVD present. No tracheal deviation present. No thyromegaly present.  Cardiovascular: Normal rate, regular rhythm, normal heart sounds and intact distal pulses.  Exam reveals no gallop and no friction rub.   No murmur heard. Pulmonary/Chest: Effort normal. No accessory muscle usage or stridor. Not tachypneic. No respiratory distress. He has no decreased breath sounds. He has no wheezes. He has rhonchi in the right lower field and the left lower field. He has rales in the right lower field.  Abdominal: Soft.  Bowel sounds are normal. He exhibits no distension and no mass. There is no tenderness. There is no rebound and no guarding.  Musculoskeletal: Normal range of motion. He exhibits  no edema and no tenderness.  Lymphadenopathy:    He has no cervical adenopathy.  Neurological: He is oriented to person, place, and time.  Skin: Skin is warm and dry. No rash noted. He is not diaphoretic. No erythema. No pallor.  Psychiatric: He has a normal mood and affect. His behavior is normal. Judgment and thought content normal.     Lab Results  Component Value Date   WBC 9.2 05/01/2012   HGB 14.6 05/01/2012   HCT 43.4 05/01/2012   PLT 249 05/01/2012   GLUCOSE 140* 05/01/2012   CHOL 168 08/27/2011   TRIG 250.0* 08/27/2011   HDL 38.60* 08/27/2011   LDLDIRECT 98.4 08/27/2011   ALT 15 08/27/2011   AST 18 08/27/2011   NA 137 05/01/2012   K 2.6* 05/01/2012   CL 96 05/01/2012   CREATININE 1.23 05/01/2012   BUN 15 05/01/2012   CO2 26 05/01/2012   TSH 1.64 08/27/2011   Dg Chest 2 View  05/01/2012  *RADIOLOGY REPORT*  Clinical Data: Asthma  CHEST - 2 VIEW  Comparison: 08/27/2011  Findings: Heart size upper normal.  Aortic arch atherosclerosis. Mild lingular opacity.  Mild interstitial coarsening/bronchitic change.  No pleural effusion or pneumothorax.  No acute osseous finding.  IMPRESSION: Heart size upper normal.  Mild bronchitic change may reflect sequelae of smoking or asthma.  Mild lingular opacity is nonspecific; scarring, atelectasis, or infiltrate.   Original Report Authenticated By: Waneta Martins, M.D.      Assessment & Plan:

## 2012-05-05 NOTE — Assessment & Plan Note (Signed)
I will recheck his CXR today 

## 2012-05-05 NOTE — Assessment & Plan Note (Signed)
Repeat FLP level today

## 2012-05-05 NOTE — Patient Instructions (Signed)

## 2012-05-05 NOTE — Assessment & Plan Note (Signed)
I will check his a1c today to see if he has developed DM II 

## 2012-05-05 NOTE — Assessment & Plan Note (Signed)
His K+ is significantly low and his BP is not well controlled so I have changed his diuretic to dyazide and benicar, I will check his lytes and renal function today

## 2012-05-05 NOTE — Assessment & Plan Note (Signed)
I will recheck his K+ level and will check his Mg++ level, also have changed his diuretic to dyazide in the hopes that the K+ sparing component will raise his K+ level

## 2012-05-06 ENCOUNTER — Encounter: Payer: Self-pay | Admitting: Internal Medicine

## 2012-05-06 NOTE — Addendum Note (Signed)
Addended by: Etta Grandchild on: 05/06/2012 07:52 AM   Modules accepted: Orders

## 2012-05-07 ENCOUNTER — Ambulatory Visit: Payer: BC Managed Care – PPO | Admitting: Internal Medicine

## 2012-05-12 ENCOUNTER — Encounter (HOSPITAL_COMMUNITY): Payer: Self-pay | Admitting: Emergency Medicine

## 2012-05-12 ENCOUNTER — Emergency Department (HOSPITAL_COMMUNITY)
Admission: EM | Admit: 2012-05-12 | Discharge: 2012-05-12 | Disposition: A | Discharge status: Home / Self Care | Payer: BC Managed Care – PPO | Attending: Emergency Medicine | Admitting: Emergency Medicine

## 2012-05-12 ENCOUNTER — Emergency Department (HOSPITAL_COMMUNITY): Payer: BC Managed Care – PPO

## 2012-05-12 DIAGNOSIS — F17200 Nicotine dependence, unspecified, uncomplicated: Secondary | ICD-10-CM | POA: Insufficient documentation

## 2012-05-12 DIAGNOSIS — J4489 Other specified chronic obstructive pulmonary disease: Secondary | ICD-10-CM | POA: Insufficient documentation

## 2012-05-12 DIAGNOSIS — I10 Essential (primary) hypertension: Secondary | ICD-10-CM | POA: Insufficient documentation

## 2012-05-12 DIAGNOSIS — K219 Gastro-esophageal reflux disease without esophagitis: Secondary | ICD-10-CM | POA: Insufficient documentation

## 2012-05-12 DIAGNOSIS — J449 Chronic obstructive pulmonary disease, unspecified: Secondary | ICD-10-CM

## 2012-05-12 MED ORDER — IPRATROPIUM BROMIDE 0.02 % IN SOLN
RESPIRATORY_TRACT | Status: AC
  Administered 2012-05-12: 16:00:00
  Filled 2012-05-12: qty 2.5

## 2012-05-12 MED ORDER — ALBUTEROL SULFATE (5 MG/ML) 0.5% IN NEBU
INHALATION_SOLUTION | RESPIRATORY_TRACT | Status: AC
  Administered 2012-05-12: 16:00:00
  Filled 2012-05-12: qty 1

## 2012-05-12 NOTE — ED Provider Notes (Signed)
History     CSN: [id number]  Arrival date & time 05/12/12  1354   First MD Initiated Contact with Patient 05/12/12 1603      No chief complaint on file.   (Consider location/radiation/quality/duration/timing/severity/associated sxs/prior treatment) The history is provided by the patient.  pt with sob and cough x 2 days--no fever, chills, vomiting, or diarrhea--sx worse with ragweed and better with albuterol--recently tx for pna--pt was indtructed to use a steroid inhaler but has deferred--denies chest pain or leg swelling--feels at his baseline currently  Past Medical History  Diagnosis Date  . GERD (gastroesophageal reflux disease)   . Allergy   . Diverticulitis   . History of colon polyps   . COPD (chronic obstructive pulmonary disease)   . Cardiomyopathy 2011    Patient was told that he had an ejection fraction of 37% by echo done elsewhere in 2011  . Hypertension   . Tobacco abuse   . Asthma     Past Surgical History  Procedure Date  . Colon surgery 2009    Family History  Problem Relation Age of Onset  . Arthritis Other     parents  . Cancer Other     Colon,Breast, Prostate Cancer  . Hypertension Other   . Diabetes Other     parents  . Colon cancer Mother   . Prostate cancer Father     History  Substance Use Topics  . Smoking status: Current Every Day Smoker -- 0.5 packs/day for 25 years    Types: Cigarettes  . Smokeless tobacco: Never Used  . Alcohol Use: 7.2 oz/week    12 Shots of liquor per week      Review of Systems  All other systems reviewed and are negative.    Allergies  Lisinopril; Penicillins; and Tribenzor  Home Medications   Current Outpatient Rx  Name Route Sig Dispense Refill  . ASPIRIN EC 81 MG PO TBEC Oral Take 81 mg by mouth daily.    . BUDESONIDE-FORMOTEROL FUMARATE 160-4.5 MCG/ACT IN AERO Inhalation Inhale 2 puffs into the lungs 2 (two) times daily. 1 Inhaler 12  . OMEGA-3 FATTY ACIDS 1000 MG PO CAPS Oral Take 2 g by  mouth daily.      Marland Kitchen METOPROLOL SUCCINATE ER 50 MG PO TB24 Oral Take 1 tablet (50 mg total) by mouth daily. Take with or immediately following a meal. 30 tablet 11  . ONE-DAILY MULTI VITAMINS PO TABS Oral Take 1 tablet by mouth daily.      Marland Kitchen OLMESARTAN MEDOXOMIL 40 MG PO TABS Oral Take 1 tablet (40 mg total) by mouth daily. 90 tablet 3  . OMEPRAZOLE 20 MG PO CPDR Oral Take 20 mg by mouth daily.    Marland Kitchen POTASSIUM CHLORIDE CRYS ER 20 MEQ PO TBCR Oral Take 1 tablet (20 mEq total) by mouth 2 (two) times daily. 20 tablet 0  . PREDNISONE 20 MG PO TABS Oral Take 2 tablets (40 mg total) by mouth daily. 10 tablet 0  . TRIAMTERENE-HCTZ 37.5-25 MG PO CAPS Oral Take 1 each (1 capsule total) by mouth every morning. 90 capsule 1    BP 151/68  Pulse 56  Temp 98.3 F (36.8 C)  Resp 20  SpO2 95%  Physical Exam  Nursing note and vitals reviewed. Constitutional: He is oriented to person, place, and time. He appears well-developed and well-nourished.  Non-toxic appearance. No distress.  HENT:  Head: Normocephalic and atraumatic.  Eyes: Conjunctivae normal, EOM and lids are normal. Pupils are  equal, round, and reactive to light.  Neck: Normal range of motion. Neck supple. No tracheal deviation present. No mass present.  Cardiovascular: Normal rate, regular rhythm and normal heart sounds.  Exam reveals no gallop.   No murmur heard. Pulmonary/Chest: Effort normal and breath sounds normal. No stridor. No respiratory distress. He has no decreased breath sounds. He has no wheezes. He has no rhonchi. He has no rales.  Abdominal: Soft. Normal appearance and bowel sounds are normal. He exhibits no distension. There is no tenderness. There is no rebound and no CVA tenderness.  Musculoskeletal: Normal range of motion. He exhibits no edema and no tenderness.  Neurological: He is alert and oriented to person, place, and time. He has normal strength. No cranial nerve deficit or sensory deficit. GCS eye subscore is 4. GCS  verbal subscore is 5. GCS motor subscore is 6.  Skin: Skin is warm and dry. No abrasion and no rash noted.  Psychiatric: He has a normal mood and affect. His speech is normal and behavior is normal.    ED Course  Procedures (including critical care time)  Labs Reviewed - No data to display Dg Chest 2 View  05/12/2012  *RADIOLOGY REPORT*  Clinical Data: Shortness of breath.  CHEST - 2 VIEW  Comparison: 05/05/2012 and 05/01/2012  Findings: The heart, mediastinum and hilar contours are normal. The lungs are clear.  There are no infiltrates or masses.  There are no effusions or pneumothoraces.  There are no acute bony changes.  IMPRESSION: No active disease.   Original Report Authenticated By: Mervin Hack, M.D.      1. COPD (chronic obstructive pulmonary disease)       MDM  cxr without acute findings, pt does not want oral or inhaled steroids--will continue using his albuterol mdi prn and f/u his pcp        Toy Baker, MD 05/12/12 1612

## 2012-05-12 NOTE — ED Notes (Signed)
States was seen here before for wheezing and today had attack albuteral not helping

## 2012-05-14 DIAGNOSIS — Z0279 Encounter for issue of other medical certificate: Secondary | ICD-10-CM

## 2012-05-21 ENCOUNTER — Ambulatory Visit: Payer: BC Managed Care – PPO | Admitting: Cardiovascular Disease

## 2012-08-25 ENCOUNTER — Other Ambulatory Visit: Payer: Self-pay | Admitting: Internal Medicine

## 2012-10-01 ENCOUNTER — Other Ambulatory Visit: Payer: Self-pay | Admitting: Internal Medicine

## 2012-10-04 ENCOUNTER — Other Ambulatory Visit: Payer: Self-pay | Admitting: Internal Medicine

## 2012-10-17 ENCOUNTER — Inpatient Hospital Stay (HOSPITAL_COMMUNITY): Payer: BC Managed Care – PPO

## 2012-10-17 ENCOUNTER — Encounter (HOSPITAL_COMMUNITY): Payer: Self-pay | Admitting: Emergency Medicine

## 2012-10-17 ENCOUNTER — Other Ambulatory Visit: Payer: Self-pay

## 2012-10-17 ENCOUNTER — Inpatient Hospital Stay (HOSPITAL_COMMUNITY)
Admission: EM | Admit: 2012-10-17 | Discharge: 2012-10-21 | DRG: 294 | Disposition: A | Discharge status: Home / Self Care | Payer: BC Managed Care – PPO | Attending: Family Medicine | Admitting: Family Medicine

## 2012-10-17 DIAGNOSIS — G9340 Encephalopathy, unspecified: Secondary | ICD-10-CM

## 2012-10-17 DIAGNOSIS — I428 Other cardiomyopathies: Secondary | ICD-10-CM | POA: Diagnosis present

## 2012-10-17 DIAGNOSIS — E876 Hypokalemia: Secondary | ICD-10-CM | POA: Diagnosis not present

## 2012-10-17 DIAGNOSIS — E1100 Type 2 diabetes mellitus with hyperosmolarity without nonketotic hyperglycemic-hyperosmolar coma (NKHHC): Principal | ICD-10-CM | POA: Diagnosis present

## 2012-10-17 DIAGNOSIS — R599 Enlarged lymph nodes, unspecified: Secondary | ICD-10-CM | POA: Diagnosis present

## 2012-10-17 DIAGNOSIS — G9341 Metabolic encephalopathy: Secondary | ICD-10-CM | POA: Diagnosis present

## 2012-10-17 DIAGNOSIS — E860 Dehydration: Secondary | ICD-10-CM | POA: Diagnosis present

## 2012-10-17 DIAGNOSIS — F17200 Nicotine dependence, unspecified, uncomplicated: Secondary | ICD-10-CM | POA: Diagnosis present

## 2012-10-17 DIAGNOSIS — J4489 Other specified chronic obstructive pulmonary disease: Secondary | ICD-10-CM | POA: Diagnosis present

## 2012-10-17 DIAGNOSIS — I517 Cardiomegaly: Secondary | ICD-10-CM

## 2012-10-17 DIAGNOSIS — R05 Cough: Secondary | ICD-10-CM

## 2012-10-17 DIAGNOSIS — Z7982 Long term (current) use of aspirin: Secondary | ICD-10-CM

## 2012-10-17 DIAGNOSIS — Z8719 Personal history of other diseases of the digestive system: Secondary | ICD-10-CM

## 2012-10-17 DIAGNOSIS — R948 Abnormal results of function studies of other organs and systems: Secondary | ICD-10-CM | POA: Diagnosis present

## 2012-10-17 DIAGNOSIS — R7309 Other abnormal glucose: Secondary | ICD-10-CM

## 2012-10-17 DIAGNOSIS — N179 Acute kidney failure, unspecified: Secondary | ICD-10-CM

## 2012-10-17 DIAGNOSIS — I429 Cardiomyopathy, unspecified: Secondary | ICD-10-CM

## 2012-10-17 DIAGNOSIS — J449 Chronic obstructive pulmonary disease, unspecified: Secondary | ICD-10-CM

## 2012-10-17 DIAGNOSIS — Z79899 Other long term (current) drug therapy: Secondary | ICD-10-CM

## 2012-10-17 DIAGNOSIS — Z888 Allergy status to other drugs, medicaments and biological substances status: Secondary | ICD-10-CM

## 2012-10-17 DIAGNOSIS — E782 Mixed hyperlipidemia: Secondary | ICD-10-CM

## 2012-10-17 DIAGNOSIS — I10 Essential (primary) hypertension: Secondary | ICD-10-CM | POA: Diagnosis present

## 2012-10-17 DIAGNOSIS — Z720 Tobacco use: Secondary | ICD-10-CM

## 2012-10-17 DIAGNOSIS — I4891 Unspecified atrial fibrillation: Secondary | ICD-10-CM | POA: Diagnosis present

## 2012-10-17 DIAGNOSIS — Z860100 Personal history of colon polyps, unspecified: Secondary | ICD-10-CM

## 2012-10-17 DIAGNOSIS — R739 Hyperglycemia, unspecified: Secondary | ICD-10-CM

## 2012-10-17 DIAGNOSIS — Z880 Allergy status to penicillin: Secondary | ICD-10-CM

## 2012-10-17 DIAGNOSIS — K219 Gastro-esophageal reflux disease without esophagitis: Secondary | ICD-10-CM | POA: Diagnosis present

## 2012-10-17 HISTORY — DX: Type 2 diabetes mellitus without complications: E11.9

## 2012-10-17 LAB — GLUCOSE, CAPILLARY
Glucose-Capillary: 544 mg/dL — ABNORMAL HIGH (ref 70–99)
Glucose-Capillary: 440 mg/dL — ABNORMAL HIGH (ref 70–99)
Glucose-Capillary: 383 mg/dL — ABNORMAL HIGH (ref 70–99)
Glucose-Capillary: 372 mg/dL — ABNORMAL HIGH (ref 70–99)
Glucose-Capillary: 301 mg/dL — ABNORMAL HIGH (ref 70–99)
Glucose-Capillary: 173 mg/dL — ABNORMAL HIGH (ref 70–99)
Glucose-Capillary: 178 mg/dL — ABNORMAL HIGH (ref 70–99)

## 2012-10-17 LAB — CBC
HCT: 45.4 % (ref 39.0–52.0)
Hemoglobin: 16.1 g/dL (ref 13.0–17.0)
MCH: 32.9 pg (ref 26.0–34.0)
MCV: 92.8 fL (ref 78.0–100.0)
RBC: 4.89 MIL/uL (ref 4.22–5.81)

## 2012-10-17 LAB — POCT I-STAT, CHEM 8
Calcium, Ion: 1.13 mmol/L (ref 1.13–1.30)
HCT: 46 % (ref 39.0–52.0)
Sodium: 138 mEq/L (ref 135–145)
TCO2: 30 mmol/L (ref 0–100)

## 2012-10-17 LAB — URINALYSIS, ROUTINE W REFLEX MICROSCOPIC
Ketones, ur: >80 mg/dL — AB
Leukocytes, UA: NEGATIVE
Nitrite: NEGATIVE
Protein, ur: NEGATIVE mg/dL
pH: 5 (ref 5.0–8.0)

## 2012-10-17 LAB — CBC WITH DIFFERENTIAL/PLATELET
Eosinophils Absolute: 0.1 10*3/uL (ref 0.0–0.7)
Eosinophils Relative: 1 % (ref 0–5)
Lymphs Abs: 1.2 10*3/uL (ref 0.7–4.0)
MCH: 31.5 pg (ref 26.0–34.0)
MCV: 90.8 fL (ref 78.0–100.0)
Monocytes Relative: 3 % (ref 3–12)
Platelets: 241 10*3/uL (ref 150–400)
RBC: 4.67 MIL/uL (ref 4.22–5.81)

## 2012-10-17 LAB — HEMOGLOBIN A1C
Hgb A1c MFr Bld: 13.7 % — ABNORMAL HIGH (ref <5.7)
Mean Plasma Glucose: 346 mg/dL — ABNORMAL HIGH (ref <117)

## 2012-10-17 LAB — COMPREHENSIVE METABOLIC PANEL
BUN: 20 mg/dL (ref 6–23)
Calcium: 9.3 mg/dL (ref 8.4–10.5)
GFR calc Af Amer: 68 mL/min — ABNORMAL LOW (ref >90)
Glucose, Bld: 511 mg/dL — ABNORMAL HIGH (ref 70–99)
Total Protein: 7.8 g/dL (ref 6.0–8.3)

## 2012-10-17 LAB — LACTIC ACID, PLASMA: Lactic Acid, Venous: 1.8 mmol/L (ref 0.5–2.2)

## 2012-10-17 LAB — CREATININE, SERUM: GFR calc Af Amer: 86 mL/min — ABNORMAL LOW (ref >90)

## 2012-10-17 LAB — POCT I-STAT TROPONIN I: Troponin i, poc: 0 ng/mL (ref 0.00–0.08)

## 2012-10-17 MED ORDER — SENNA 8.6 MG PO TABS
1.0000 | ORAL_TABLET | Freq: Two times a day (BID) | ORAL | Status: DC
  Administered 2012-10-18 – 2012-10-20 (×3): 8.6 mg via ORAL
  Filled 2012-10-17 (×11): qty 1

## 2012-10-17 MED ORDER — ONDANSETRON HCL 4 MG PO TABS: 4.0000 mg | ORAL_TABLET | Freq: Four times a day (QID) | ORAL | Status: DC | PRN

## 2012-10-17 MED ORDER — SODIUM CHLORIDE 0.9 % IV SOLN
INTRAVENOUS | Status: DC
  Administered 2012-10-17: 14:00:00 via INTRAVENOUS

## 2012-10-17 MED ORDER — SODIUM CHLORIDE 0.9 % IJ SOLN
3.0000 mL | Freq: Two times a day (BID) | INTRAMUSCULAR | Status: DC
  Administered 2012-10-18 – 2012-10-21 (×5): 3 mL via INTRAVENOUS

## 2012-10-17 MED ORDER — DEXTROSE-NACL 5-0.45 % IV SOLN: INTRAVENOUS | Status: DC

## 2012-10-17 MED ORDER — DEXTROSE 50 % IV SOLN: 25.0000 mL | INTRAVENOUS | Status: DC | PRN

## 2012-10-17 MED ORDER — HYDRALAZINE HCL 20 MG/ML IJ SOLN
10.0000 mg | INTRAMUSCULAR | Status: AC
  Administered 2012-10-17: 10 mg via INTRAVENOUS
  Filled 2012-10-17: qty 1

## 2012-10-17 MED ORDER — INSULIN DETEMIR 100 UNIT/ML ~~LOC~~ SOLN
10.0000 [IU] | Freq: Every day | SUBCUTANEOUS | Status: AC
  Administered 2012-10-17: 10 [IU] via SUBCUTANEOUS
  Filled 2012-10-17: qty 0.1

## 2012-10-17 MED ORDER — INSULIN ASPART 100 UNIT/ML ~~LOC~~ SOLN
0.0000 [IU] | Freq: Three times a day (TID) | SUBCUTANEOUS | Status: DC
  Administered 2012-10-18: 5 [IU] via SUBCUTANEOUS
  Administered 2012-10-18: 9 [IU] via SUBCUTANEOUS
  Administered 2012-10-18: 7 [IU] via SUBCUTANEOUS

## 2012-10-17 MED ORDER — OMEGA-3-ACID ETHYL ESTERS 1 G PO CAPS
2.0000 g | ORAL_CAPSULE | Freq: Every day | ORAL | Status: DC
  Administered 2012-10-18 – 2012-10-21 (×4): 2 g via ORAL
  Filled 2012-10-17 (×4): qty 2

## 2012-10-17 MED ORDER — HEPARIN SODIUM (PORCINE) 5000 UNIT/ML IJ SOLN
5000.0000 [IU] | Freq: Three times a day (TID) | INTRAMUSCULAR | Status: DC
  Administered 2012-10-17 – 2012-10-19 (×6): 5000 [IU] via SUBCUTANEOUS
  Filled 2012-10-17 (×9): qty 1

## 2012-10-17 MED ORDER — ACETAMINOPHEN 650 MG RE SUPP: 650.0000 mg | Freq: Four times a day (QID) | RECTAL | Status: DC | PRN

## 2012-10-17 MED ORDER — INSULIN DETEMIR 100 UNIT/ML ~~LOC~~ SOLN: 10.0000 [IU] | Freq: Every day | SUBCUTANEOUS | Status: DC

## 2012-10-17 MED ORDER — ONDANSETRON HCL 4 MG/2ML IJ SOLN: 4.0000 mg | Freq: Four times a day (QID) | INTRAMUSCULAR | Status: DC | PRN

## 2012-10-17 MED ORDER — ASPIRIN EC 81 MG PO TBEC
81.0000 mg | DELAYED_RELEASE_TABLET | Freq: Every day | ORAL | Status: DC
  Administered 2012-10-18: 81 mg via ORAL
  Filled 2012-10-17 (×2): qty 1

## 2012-10-17 MED ORDER — INSULIN ASPART 100 UNIT/ML ~~LOC~~ SOLN
0.0000 [IU] | Freq: Every day | SUBCUTANEOUS | Status: DC
  Administered 2012-10-18: 4 [IU] via SUBCUTANEOUS

## 2012-10-17 MED ORDER — SODIUM CHLORIDE 0.9 % IV BOLUS (SEPSIS)
1000.0000 mL | INTRAVENOUS | Status: AC
  Administered 2012-10-17: 1000 mL via INTRAVENOUS

## 2012-10-17 MED ORDER — PANTOPRAZOLE SODIUM 40 MG PO TBEC
40.0000 mg | DELAYED_RELEASE_TABLET | Freq: Every day | ORAL | Status: DC
  Administered 2012-10-18 – 2012-10-21 (×4): 40 mg via ORAL
  Filled 2012-10-17 (×4): qty 1

## 2012-10-17 MED ORDER — SODIUM CHLORIDE 0.9 % IV BOLUS (SEPSIS): 1000.0000 mL | Freq: Once | INTRAVENOUS | Status: AC

## 2012-10-17 MED ORDER — METOPROLOL SUCCINATE ER 50 MG PO TB24
50.0000 mg | ORAL_TABLET | Freq: Every day | ORAL | Status: DC
  Administered 2012-10-18: 50 mg via ORAL
  Filled 2012-10-17 (×2): qty 1

## 2012-10-17 MED ORDER — SODIUM CHLORIDE 0.9 % IV SOLN
INTRAVENOUS | Status: DC
  Administered 2012-10-17: 12:00:00 via INTRAVENOUS

## 2012-10-17 MED ORDER — SODIUM CHLORIDE 0.9 % IV SOLN
INTRAVENOUS | Status: DC
  Administered 2012-10-17: 6.2 [IU]/h via INTRAVENOUS
  Administered 2012-10-17: 7.6 [IU]/h via INTRAVENOUS
  Filled 2012-10-17: qty 1

## 2012-10-17 MED ORDER — INSULIN REGULAR BOLUS VIA INFUSION
0.0000 [IU] | Freq: Three times a day (TID) | INTRAVENOUS | Status: DC
  Filled 2012-10-17: qty 10

## 2012-10-17 MED ORDER — OMEGA-3 FATTY ACIDS 1000 MG PO CAPS: 2.0000 g | ORAL_CAPSULE | Freq: Every day | ORAL | Status: DC

## 2012-10-17 MED ORDER — ALBUTEROL SULFATE HFA 108 (90 BASE) MCG/ACT IN AERS: 2.0000 | INHALATION_SPRAY | Freq: Four times a day (QID) | RESPIRATORY_TRACT | Status: DC | PRN

## 2012-10-17 MED ORDER — ACETAMINOPHEN 325 MG PO TABS
650.0000 mg | ORAL_TABLET | Freq: Four times a day (QID) | ORAL | Status: DC | PRN
  Administered 2012-10-18: 650 mg via ORAL
  Filled 2012-10-17: qty 2

## 2012-10-17 MED ORDER — SODIUM CHLORIDE 0.9 % IV SOLN
INTRAVENOUS | Status: DC
  Administered 2012-10-17: 3.8 [IU]/h via INTRAVENOUS
  Filled 2012-10-17: qty 1

## 2012-10-17 MED ORDER — LOSARTAN POTASSIUM 50 MG PO TABS
100.0000 mg | ORAL_TABLET | Freq: Every day | ORAL | Status: DC
  Administered 2012-10-18: 100 mg via ORAL
  Filled 2012-10-17 (×2): qty 2

## 2012-10-17 MED ORDER — SODIUM CHLORIDE 0.9 % IV SOLN: INTRAVENOUS | Status: DC

## 2012-10-17 MED ORDER — SODIUM CHLORIDE 0.9 % IV SOLN
INTRAVENOUS | Status: DC
  Administered 2012-10-17 – 2012-10-19 (×3): via INTRAVENOUS

## 2012-10-17 MED ORDER — ASPIRIN EC 81 MG PO TBEC: 81.0000 mg | DELAYED_RELEASE_TABLET | Freq: Every day | ORAL | Status: DC

## 2012-10-17 MED ORDER — ONDANSETRON HCL 4 MG/2ML IJ SOLN: 4.0000 mg | Freq: Three times a day (TID) | INTRAMUSCULAR | Status: DC | PRN

## 2012-10-17 NOTE — ED Provider Notes (Signed)
History     CSN: [id number]  Arrival date & time 10/17/12  5621   First MD Initiated Contact with Patient 10/17/12 [phone]      Chief Complaint  Patient presents with  . Altered Mental Status    (Consider location/radiation/quality/duration/timing/severity/associated sxs/prior treatment) HPI Comments: Level V caveat apply secondary to altered mental status.  The patient presents with his life partner, his partner provides most of the information. He states that over the last several weeks he has had significant urinary frequency and a significant thirst. He notes that when he has an accidental urination on the floor it feels sticky and his partner who is a diabetic suspected high blood sugar but the patient refused to allow him to check. This morning when they woke up the patient was altered, he was agitated and not responding appropriately to questions. Ambulance personnel presented found the patient have high blood sugar, bradycardia and transported the patient to the hospital. A 20-gauge Angiocath was placed in the left a.c. And approximately 500 mL of normal saline was given prior to arrival. The patient is unable to contribute to his history. He answers questions yes or no and sometimes inappropriately.  Patient is a 62 y.o. male presenting with altered mental status. The history is provided by a friend.  Altered Mental Status    Past Medical History  Diagnosis Date  . GERD (gastroesophageal reflux disease)   . Allergy   . Diverticulitis   . History of colon polyps   . COPD (chronic obstructive pulmonary disease)   . Cardiomyopathy 2011    Patient was told that he had an ejection fraction of 37% by echo done elsewhere in 2011  . Hypertension   . Tobacco abuse   . Asthma     Past Surgical History  Procedure Laterality Date  . Colon surgery  2009    Family History  Problem Relation Age of Onset  . Arthritis Other     parents  . Cancer Other     Colon,Breast, Prostate  Cancer  . Hypertension Other   . Diabetes Other     parents  . Colon cancer Mother   . Prostate cancer Father     History  Substance Use Topics  . Smoking status: Current Every Day Smoker -- 0.50 packs/day for 25 years    Types: Cigarettes  . Smokeless tobacco: Never Used  . Alcohol Use: 7.2 oz/week    12 Shots of liquor per week      Review of Systems  Unable to perform ROS: Mental status change  Psychiatric/Behavioral: Positive for altered mental status.    Allergies  Lisinopril; Penicillins; and Tribenzor  Home Medications   Current Outpatient Rx  Name  Route  Sig  Dispense  Refill  . albuterol (PROVENTIL HFA;VENTOLIN HFA) 108 (90 BASE) MCG/ACT inhaler   Inhalation   Inhale 2 puffs into the lungs every 6 (six) hours as needed for wheezing.         Marland Kitchen aspirin EC 81 MG tablet   Oral   Take 81 mg by mouth daily.         . fish oil-omega-3 fatty acids 1000 MG capsule   Oral   Take 2 g by mouth daily.          . hydrochlorothiazide (HYDRODIURIL) 25 MG tablet   Oral   Take 25 mg by mouth daily.         Marland Kitchen losartan (COZAAR) 100 MG tablet   Oral  Take 100 mg by mouth daily.         . metoprolol succinate (TOPROL-XL) 50 MG 24 hr tablet   Oral   Take 50 mg by mouth daily. Take with or immediately following a meal.         . omeprazole (PRILOSEC) 20 MG capsule   Oral   Take 20 mg by mouth daily.           BP 185/78  Pulse 69  Temp(Src) 97.3 F (36.3 C) (Oral)  Resp 20  SpO2 98%  Physical Exam  Nursing note and vitals reviewed. Constitutional: He appears well-developed and well-nourished.  HENT:  Head: Normocephalic and atraumatic.  Mouth/Throat: No oropharyngeal exudate.  No signs of head trauma, dry mucous membranes  Eyes: Conjunctivae and EOM are normal. Pupils are equal, round, and reactive to light. Right eye exhibits no discharge. Left eye exhibits no discharge. No scleral icterus.  Neck: Normal range of motion. Neck supple. No JVD  present. No thyromegaly present.  Cardiovascular: Regular rhythm, normal heart sounds and intact distal pulses.  Exam reveals no gallop and no friction rub.   No murmur heard. Bradycardia at 45 beats per minute  Pulmonary/Chest: Effort normal and breath sounds normal. No respiratory distress. He has no wheezes. He has no rales.  Abdominal: Soft. Bowel sounds are normal. He exhibits no distension and no mass. There is no tenderness.  Musculoskeletal: Normal range of motion. He exhibits no edema and no tenderness.  Lymphadenopathy:    He has no cervical adenopathy.  Neurological: He is alert.  The patient opens his eyes spontaneously, follows the occasional commands such as opening her mouth but will not show me 2 fingers, will not lift his leg, response to painful stimuli diffusely is normal. Speech is clear but disoriented, unable to tell me where he is, unable to tell me the date, able to tell me his name.  Skin: Skin is warm and dry. No rash noted. No erythema.  Psychiatric: He has a normal mood and affect. His behavior is normal.    ED Course  Procedures (including critical care time)  Labs Reviewed  GLUCOSE, CAPILLARY - Abnormal; Notable for the following:    Glucose-Capillary 544 (*)    All other components within normal limits  CBC WITH DIFFERENTIAL - Abnormal; Notable for the following:    Neutrophils Relative 81 (*)    All other components within normal limits  COMPREHENSIVE METABOLIC PANEL - Abnormal; Notable for the following:    Chloride 93 (*)    Glucose, Bld 511 (*)    Alkaline Phosphatase 124 (*)    Total Bilirubin 1.3 (*)    GFR calc non Af Amer 59 (*)    GFR calc Af Amer 68 (*)    All other components within normal limits  URINALYSIS, ROUTINE W REFLEX MICROSCOPIC - Abnormal; Notable for the following:    Specific Gravity, Urine 1.039 (*)    Glucose, UA >1000 (*)    Hgb urine dipstick TRACE (*)    Ketones, ur >80 (*)    All other components within normal limits   POCT I-STAT, CHEM 8 - Abnormal; Notable for the following:    Glucose, Bld 505 (*)    All other components within normal limits  LACTIC ACID, PLASMA  URINE MICROSCOPIC-ADD ON  POCT I-STAT TROPONIN I   No results found.   1. Encephalopathy acute   2. Hyperglycemia   3. Hypertension       MDM  The patient is bradycardic, hypertensive, suspect he is significantly dehydrated given his appearance, history of hyperglycemia likely given his polyuria, polydipsia and dehydrated state. We'll need to rule out diabetic ketoacidosis as the patient does have an altered mental status, would also consider a hyperglycemic nonketotic state as well.  Fluids, labs, ECG  ED ECG REPORT  I personally interpreted this EKG   Date: 10/17/2012   Rate: 46  Rhythm: sinus bradycardia  QRS Axis: left  Intervals: normal  ST/T Wave abnormalities: nonspecific T wave changes  Conduction Disutrbances:none  Narrative Interpretation: LVH with repolarization or melena  Old EKG Reviewed: Compared with 05/01/2012, LVH is still present, rate is now slower  The patient does not have an anion gap or acidosis, urinalysis shows significant dehydration with 80+ ketones, renal function preserved with creatinine of 1.2 and a normal lactic acid 1.8. Troponin is negative, this all suggests that the patient has a hyperglycemic nonketotic state. Will require admission to the hospital for fluids and insulin therapy, insulin drip ordered.  CRITICAL CARE Performed by: Vida Roller   Total critical care time: 35  Critical care time was exclusive of separately billable procedures and treating other patients.  Critical care was necessary to treat or prevent imminent or life-threatening deterioration.  Critical care was time spent personally by me on the following activities: development of treatment plan with patient and/or surrogate as well as nursing, discussions with consultants, evaluation of patient's response to  treatment, examination of patient, obtaining history from patient or surrogate, ordering and performing treatments and interventions, ordering and review of laboratory studies, ordering and review of radiographic studies, pulse oximetry and re-evaluation of patient's condition.  Review of the medical record shows that the patient has had an echocardiogram as an out psych facility showing an ejection fraction less than 40%, will hold further IV bolus fluids and stick with a drip at 100 cc an hour as well as intravenous insulin.  The patient has a continued altered mental status at this point appears to be more confused and unable to follow commands but is awake alert and does not appear in respiratory or cardiac distress. Heart rate remains between 40 and 60, blood pressure is significantly elevated at 210. The patient is taking beta blockers as well as an angiotensin receptor blocker at home she has not had today. He would not tolerate a beta blocker given his bradycardia and underwent given oral medications until he is more stabilized. Intravenous hydralazine ordered, discussed with hospitalist who will admit the patient.     Vida Roller, MD 10/17/12 1124

## 2012-10-17 NOTE — ED Notes (Signed)
Pt presents to ED via EMS with c/o of altered mental status since this am, room mate last saw pt normal last night. Pt reports frequent urination since yesterday. Pt alert but oriented to person and time only. NAD. 20g to left AC placed by EMS, of NS given.

## 2012-10-17 NOTE — Progress Notes (Signed)
  Echocardiogram 2D Echocardiogram has been performed.  [name] [name] 10/17/2012, 4:32 PM

## 2012-10-17 NOTE — H&P (Signed)
[facility] History and Physical  [name] [name] ONG:[id number] DOB: 1951-01-06 DOA: 10/17/2012  Referring physician: Dr. Hyacinth Meeker PCP: [name] Linger, MD  Specialists: none  Chief Complaint: confusion  HPI: [name] [name] is a 62 y.o. male  With past medical history of hypertension, as per partner who is at bedside was told he was a prediabetic and her last hemoglobin A1c on October 2013 that was 7.1. The patient is significantly confused and cannot give a history. Comes in for sudden onset of confusion. As per partner started this morning to the point where he is agitated and aggressive. He relates he's been mildly confused for the past 2 days.  But he did not pay that much attention. The patient cannot answer questions and cannot follow commands. As per partner he has no asymmetrical weakness. The patient has just been complaining of generalized weakness. He has been having polyuria and polydipsia for about 2 months. Has not been following up with his primary care Dr. He denies he's had any fever chills, dysuria, cuts or sores, no cough, or shortness of breath.  Review of Systems: The patient denies anorexia, fever, weight loss,, vision loss, decreased hearing, hoarseness, chest pain, syncope, dyspnea on exertion, peripheral edema, balance deficits, hemoptysis, abdominal pain, melena, hematochezia, severe indigestion/heartburn, hematuria, incontinence, genital sores, muscle weakness, suspicious skin lesions, transient blindness, difficulty walking, depression, unusual weight change, abnormal bleeding, enlarged lymph nodes, angioedema, and breast masses.    Past Medical History  Diagnosis Date  . GERD (gastroesophageal reflux disease)   . Allergy   . Diverticulitis   . History of colon polyps   . COPD (chronic obstructive pulmonary disease)   . Cardiomyopathy 2011    Patient was told that he had an ejection fraction of 37% by echo done elsewhere in 2011  . Hypertension   . Tobacco  abuse   . Asthma    Past Surgical History  Procedure Laterality Date  . Colon surgery  2009   Social History:  reports that he has been smoking Cigarettes.  He has a 12.5 pack-year smoking history. He has never used smokeless tobacco. He reports that he drinks about 7.2 ounces of alcohol per week. He reports that he does not use illicit drugs. Lives at home with partner can perform all his ADLs  Allergies  Allergen Reactions  . Lisinopril Cough  . Penicillins     Whelps  . Tribenzor (Olmesartan-Amlodipine-Hctz) Nausea Only    dizziness    Family History  Problem Relation Age of Onset  . Arthritis Other     parents  . Cancer Other     Colon,Breast, Prostate Cancer  . Hypertension Other   . Diabetes Other     parents  . Colon cancer Mother   . Prostate cancer Father     Prior to Admission medications   Medication Sig Start Date End Date Taking? Authorizing Provider  albuterol (PROVENTIL HFA;VENTOLIN HFA) 108 (90 BASE) MCG/ACT inhaler Inhale 2 puffs into the lungs every 6 (six) hours as needed for wheezing.   Yes Historical Provider, MD  aspirin EC 81 MG tablet Take 81 mg by mouth daily.   Yes Historical Provider, MD  fish oil-omega-3 fatty acids 1000 MG capsule Take 2 g by mouth daily.    Yes Historical Provider, MD  hydrochlorothiazide (HYDRODIURIL) 25 MG tablet Take 25 mg by mouth daily.   Yes Historical Provider, MD  losartan (COZAAR) 100 MG tablet Take 100 mg by mouth daily.   Yes Historical Provider, MD  metoprolol succinate (TOPROL-XL) 50 MG 24 hr tablet Take 50 mg by mouth daily. Take with or immediately following a meal.   Yes Historical Provider, MD  omeprazole (PRILOSEC) 20 MG capsule Take 20 mg by mouth daily.   Yes Historical Provider, MD   Physical Exam: Filed Vitals:   10/17/12 1045 10/17/12 1130 10/17/12 1145 10/17/12 1149  BP: 191/90 182/73 182/67 182/67  Pulse: 51 67 78   Temp:      TempSrc:      Resp: 23 23 23    SpO2: 98% 97% 96%     BP 182/67   Pulse 78  Temp(Src) 97.3 F (36.3 C) (Oral)  Resp 23  SpO2 96%  General Appearance:    confused and lethargic   Head:    Normocephalic, without obvious abnormality, atraumatic  Eyes:    PERRL, conjunctiva/corneas clear, EOM's intact, fundi    benign, both eyes       Ears:    Nose:   Throat:   cracked and dry mucous membranes   Neck:   Supple, symmetrical, trachea midline, no adenopathy;       thyroid:  No enlargement/tenderness/nodules; no carotid   bruit or JVD  Back:     Symmetric, no curvature, ROM normal, no CVA tenderness  Lungs:     Clear to auscultation bilaterally, respirations unlabored  Chest wall:    No tenderness or deformity  Heart:    Regular rate and rhythm, S1 and S2 normal, no murmur, rub   or gallop  Abdomen:     Soft, non-tender, bowel sounds active all four quadrants,    no masses, no organomegaly        Extremities:   Extremities normal, atraumatic, no cyanosis or edema  Pulses:   2+ and symmetric all extremities  Skin:   Skin color, texture, turgor normal, no rashes or lesions  Lymph nodes:   Cervical, supraclavicular, and axillary nodes normal  Neurologic:   4 extremities without any difficulties, he has no facial droop, his tongue is midline. His pupils are equally round and reactive to light and accommodation and extraocular movements are intact     Labs on Admission:  Basic Metabolic Panel:  Recent Labs Lab 10/17/12 1005 10/17/12 1030  NA 136 138  K 3.7 3.7  CL 93* 98  CO2 26  --   GLUCOSE 511* 505*  BUN 20 22  CREATININE 1.26 1.30  CALCIUM 9.3  --    Liver Function Tests:  Recent Labs Lab 10/17/12 1005  AST 15  ALT 16  ALKPHOS 124*  BILITOT 1.3*  PROT 7.8  ALBUMIN 3.7   No results found for this basename: LIPASE, AMYLASE,  in the last 168 hours No results found for this basename: AMMONIA,  in the last 168 hours CBC:  Recent Labs Lab 10/17/12 1005 10/17/12 1030  WBC 7.8  --   NEUTROABS 6.3  --   HGB 14.7 15.6  HCT 42.4  46.0  MCV 90.8  --   PLT 241  --    Cardiac Enzymes: No results found for this basename: CKTOTAL, CKMB, CKMBINDEX, TROPONINI,  in the last 168 hours  BNP (last 3 results)  Recent Labs  05/01/12 0604 05/05/12 0949  PROBNP 2736.0* 399.0*   CBG:  Recent Labs Lab 10/17/12 0957 10/17/12 1137  GLUCAP 544* 440*    Radiological Exams on Admission: No results found.  EKG: Independently reviewed. Sinus bradycardia  Assessment/Plan Acute metabolic encephalopathy due to  Hyperosmolar non-ketotic state  in patient with type 2 diabetes mellitus:  - His metabolic encephalopathy is probably secondary to his elevated blood glucose. By physical exam he is not seem to be focal.  - I agree with starting aggressive IV fluids and IV insulin per glucose stabilizer protocol per emergency room physician. His partner seems very knowledgeable about all his medical condition he does relate he has heart failure with an ejection fraction of 3 5%. We'll insert a Foley and continue to monitor strict I.'s and nose.   - We will have to start him empirically at this time on Levemir 10 units. He does not have an anion gap his bicarbonate is 26. He does have mild elevation of his creatinine. Once the patient is more awake and able to take orals can consider oral hypoglycemic agents.  - There is no signs of infection at this point. I would not start empiric antibiotics. His last hemoglobin A1c was 7.1 back in October 2013. We'll go ahead and do a chest x-ray.  AKI: - Right and some IV fluids continue strict I.'s and nose. Place a Foley catheter and check a basic metabolic panel in the morning.  Essential hypertension, benign: - His blood pressure is elevated. It could be secondary to stress. I will continue to monitor and we'll hold his blood pressure medication except his metoprolol.  Cardiomyopathy - No documented in echo in the system. But his partner seems very knowledgeable about the patient's health.  Will go ahead and get a 2-D echo. Continue his metoprolol. Hold his ACE inhibitor and diuretic.  Code Status: full Family Communication: partner and son Disposition Plan: inpatient 2-3 days Time spent: 60 minutes  Marinda Elk [facility] Pager [phone]  If 7PM-7AM, please contact night-coverage [email or web address] Password [facility] 10/17/2012, 12:18 PM

## 2012-10-18 ENCOUNTER — Inpatient Hospital Stay (HOSPITAL_COMMUNITY): Payer: BC Managed Care – PPO

## 2012-10-18 DIAGNOSIS — J449 Chronic obstructive pulmonary disease, unspecified: Secondary | ICD-10-CM

## 2012-10-18 DIAGNOSIS — I428 Other cardiomyopathies: Secondary | ICD-10-CM

## 2012-10-18 DIAGNOSIS — N179 Acute kidney failure, unspecified: Secondary | ICD-10-CM

## 2012-10-18 LAB — COMPREHENSIVE METABOLIC PANEL
Alkaline Phosphatase: 96 U/L (ref 39–117)
BUN: 16 mg/dL (ref 6–23)
CO2: 21 mEq/L (ref 19–32)
Calcium: 9 mg/dL (ref 8.4–10.5)
GFR calc Af Amer: 83 mL/min — ABNORMAL LOW (ref >90)
GFR calc non Af Amer: 72 mL/min — ABNORMAL LOW (ref >90)
Glucose, Bld: 295 mg/dL — ABNORMAL HIGH (ref 70–99)
Total Protein: 6.7 g/dL (ref 6.0–8.3)

## 2012-10-18 LAB — CBC
HCT: 40.3 % (ref 39.0–52.0)
Hemoglobin: 13.9 g/dL (ref 13.0–17.0)
MCHC: 34.5 g/dL (ref 30.0–36.0)

## 2012-10-18 LAB — HIV ANTIBODY (ROUTINE TESTING W REFLEX): HIV: NONREACTIVE

## 2012-10-18 LAB — GLUCOSE, CAPILLARY: Glucose-Capillary: 232 mg/dL — ABNORMAL HIGH (ref 70–99)

## 2012-10-18 MED ORDER — INSULIN DETEMIR 100 UNIT/ML ~~LOC~~ SOLN
10.0000 [IU] | Freq: Every day | SUBCUTANEOUS | Status: DC
  Administered 2012-10-18: 10 [IU] via SUBCUTANEOUS
  Filled 2012-10-18 (×2): qty 0.1

## 2012-10-18 MED ORDER — HYDRALAZINE HCL 20 MG/ML IJ SOLN
10.0000 mg | Freq: Four times a day (QID) | INTRAMUSCULAR | Status: DC | PRN
  Administered 2012-10-18 – 2012-10-19 (×2): 10 mg via INTRAVENOUS
  Filled 2012-10-18 (×2): qty 1

## 2012-10-18 NOTE — Progress Notes (Signed)
[facility] PROGRESS NOTE  Burleigh Brockmann ZOX:[id number] DOB: [birth year] DOA: 10/17/2012 PCP: [name], MD  Assessment/Plan: Principal Problem:   Hyperosmolar non-ketotic state in patient with type 2 diabetes mellitus Active Problems:   Essential hypertension, benign   Cardiomyopathy   AKI (acute kidney injury)   Metabolic encephalopathy     1. Type 2 diabetes mellitus/Hyperosmolar non-ketotic state: Patient presented with 2 months of poydipsia/polyuria, culminating in an acute confusional state on 10/17/12, against a known background of HBA1C 7.1 in 04/2012. Initial labs revealed random blood glucose of 511, CO2 26, sodium 136, and patient appeared clinically dehydrated. He was managed with ivi insulin infusion, per Glucostablizer protocol, as well as with iv fluids, and as of this AM, CBGs are ranging between 173-300. He has been transitioned to SSI/Levemir insulin. Adjusting as indicated. Patient feels considerably better. HBA1C is now 13.7. We shall check lipid profile, TSH, and obtain diabetic teaching.  2. Acute metabolic encephalopathy: As described above, patient presented with altered mental status. This was metabolic in etiology, due to hyperglycemic, hyperosmolar state,, as well as dehydration. No evidence of active infection was elicited. Head CT scan revealed no definite CT evidence of acute intracranial abnormality. As of this AM, patient is fully oriented and mental status is back to baseline. Will check CXR for completeness. Of note, head CT scan revealed nasal sinus disease. Will do dedicated sinus CT.  3. Dehydration/AKI: Baseline creatinine 1.2 on 05/05/12. Patient presented with creatinine os 1.26, BUN 20, and looked clinically dehydrated. Managing with iv fluids, and hydration has improved.  4. Essential hypertension, benign: BP was elevated at 182/67 at presentation. Continued on Metoprolol, and BP has improved. As he is allergic to ACE-i, will add ARB.  5.  Cardiomyopathy: Patient is said to have a cardiomyopathy. Clinically at this time, he has no evidence of CHF. 2D echocardiogram showed normal LV cavity size, severe LVH, Ef of 65% to 70% and grade 1 diastolic dysfunction. The LA was mildly dilated.     Code Status: Full Code. Family Communication:  Disposition Plan: To be determined.   Brief narrative: 62 y.o. male history of HTN, COPD/Asthma, tobacco abuse, cardiomyopathy, diverticulosis, possible prediabetic, with reported HBA1C 7.1 in 04/2012, presenting with sudden onset of confusion, per partner, which started in AM of 10/17/12, to the point where he was agitated and aggressive. Patient had been been mildly confused for the past 2 days, and had been complaining of generalized weakness. He has been having polyuria and polydipsia for about 2 months.    Consultants:  N/A.   Procedures:  Head CT scan.   Antibiotics:  N/A.   HPI/Subjective: Feels much better.   Objective: Vital signs in last 24 hours: Temp:  [97.3 F (36.3 C)-99.2 F (37.3 C)] 97.9 F (36.6 C) (03/23 0532) Pulse Rate:  [47-81] 66 (03/23 0532) Resp:  [18-29] 18 (03/23 0532) BP: (115-192)/(66-105) 167/84 mmHg (03/23 0532) SpO2:  [92 %-98 %] 92 % (03/23 0532) Weight:  [83.689 kg (184 lb 8 oz)] 83.689 kg (184 lb 8 oz) (03/23 0500) Weight change:     Intake/Output from previous day: 03/22 0701 - 03/23 0700 In: 1078.3 [I.V.:1078.3] Out: 800 [Urine:800]     Physical Exam: General: Comfortable, alert, communicative, fully oriented, not short of breath at rest.  HEENT:  No clinical pallor, no jaundice, no conjunctival injection or discharge. Hydration has improved. NECK:  Supple, JVP not seen, no carotid bruits, no palpable lymphadenopathy, no palpable goiter. CHEST:  Clinically clear to auscultation, no  wheezes, no crackles. HEART:  Sounds 1 and 2 heard, normal, regular, no murmurs. ABDOMEN:  Full, soft, non-tender, no palpable organomegaly, no palpable  masses, normal bowel sounds. GENITALIA:  Not examined. LOWER EXTREMITIES:  No pitting edema, palpable peripheral pulses. MUSCULOSKELETAL SYSTEM:  Generalized osteoarthritic changes, otherwise, normal. CENTRAL NERVOUS SYSTEM:  No focal neurologic deficit on gross examination.  Lab Results:  Recent Labs  10/17/12 1005 10/17/12 1030 10/17/12 1501  WBC 7.8  --  11.8*  HGB 14.7 15.6 16.1  HCT 42.4 46.0 45.4  PLT 241  --  263    Recent Labs  10/17/12 1005 10/17/12 1030 10/17/12 1501  NA 136 138  --   K 3.7 3.7  --   CL 93* 98  --   CO2 26  --   --   GLUCOSE 511* 505*  --   BUN 20 22  --   CREATININE 1.26 1.30 1.04  CALCIUM 9.3  --   --    No results found for this or any previous visit (from the past 240 hour(s)).   Studies/Results: Ct Head Wo Contrast  10/17/2012  *RADIOLOGY REPORT*  Clinical Data: Confusion  CT HEAD WITHOUT CONTRAST  Technique:  Contiguous axial images were obtained from the base of the skull through the vertex without contrast.  Comparison: None.  Findings: Periventricular and subcortical white matter hypodensities are nospecific, however, most in keeping with chronic microangiopathic change.  There is no evidence for acute hemorrhage, hydrocephalus, mass lesion, or abnormal extra-axial fluid collection.  No definite CT evidence for acute infarction. Opacified left maxillary sinus, anterior left ethmoid air cells, and left frontal sinus with high attenuation.  Mildly thickened posterior left maxillary sinus wall.  Medial wall appears diminutive/eroded.  Right paranasal sinuses and mastoid air cells are predominately clear.  IMPRESSION: White matter hypodensities, a nonspecific finding most often seen with chronic microangiopathic change.  No definite CT evidence of acute intracranial abnormality.  High attenuation opacification of the left paranasal sinuses may reflect inspissated secretions, fungal colonization, or blood products. Correlate with symptoms and  dedicated sinus imaging if warranted.   Original Report Authenticated By: Jearld Lesch, M.D.     Medications: Scheduled Meds: . aspirin EC  81 mg Oral Daily  . heparin  5,000 Units Subcutaneous Q8H  . insulin aspart  0-5 Units Subcutaneous QHS  . insulin aspart  0-9 Units Subcutaneous TID WC  . losartan  100 mg Oral Daily  . metoprolol succinate  50 mg Oral Daily  . omega-3 acid ethyl esters  2 g Oral Daily  . pantoprazole  40 mg Oral Daily  . senna  1 tablet Oral BID  . sodium chloride  3 mL Intravenous Q12H   Continuous Infusions: . sodium chloride 75 mL/hr at 10/17/12 2204   PRN Meds:.acetaminophen, acetaminophen, albuterol, dextrose, ondansetron (ZOFRAN) IV, ondansetron    LOS: 1 day   Venkat Ankney,CHRISTOPHER  [facility] Pager [phone]. If 8PM-8AM, please contact night-coverage at [email or web address], password [facility] 10/18/2012, 7:16 AM  LOS: 1 day

## 2012-10-18 NOTE — Progress Notes (Signed)
10/18/12 verbal order to d/c telemetry from Dr. Brien Few.

## 2012-10-18 NOTE — Progress Notes (Signed)
  Comment:   Maxillofacial/sinus CT scan report seen. This shows Left-sided opacification of the maxillary ethmoid and frontal sinuses with high density secretions/tumor. There is  remodeling/destruction of the medial wall of the left maxillary antrum. 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 Neoplasm is a possibility given the given the bony changes.  Plan: Have consulted Dr Sheran Spine, ENT surgeon. He will see patient on 10/19/12.   C. Tremar Wickens. MD. Jerrel Ivory.

## 2012-10-18 NOTE — Evaluation (Signed)
Physical Therapy Evaluation Patient Details Name: [name] [name] MRN: [id number] DOB: 27-Feb-1951 Today's Date: 10/18/2012 Time: 1191-4782 PT Time Calculation (min): 22 min  PT Assessment / Plan / Recommendation Clinical Impression  Patient is a 62 yo male admitted with acute encephalopathy with uncontrolled DM.  Patient alert and oriented today.  Independent with all mobility/gait with good balance.  No acute PT needs identified - PT will sign off.  Encouraged ambulation in hallway with family/nursing.    PT Assessment  Patent does not need any further PT services    Follow Up Recommendations  No PT follow up;Supervision - Intermittent    Does the patient have the potential to tolerate intense rehabilitation      Barriers to Discharge        Equipment Recommendations  None recommended by PT    Recommendations for Other Services     Frequency      Precautions / Restrictions Precautions Precautions: None Restrictions Weight Bearing Restrictions: No   Pertinent Vitals/Pain       Mobility  Bed Mobility Bed Mobility: Supine to Sit;Sit to Supine Supine to Sit: 7: Independent Sit to Supine: 7: Independent Details for Bed Mobility Assistance: No cues or assist needed Transfers Transfers: Sit to Stand;Stand to Sit Sit to Stand: 7: Independent;From bed Stand to Sit: 7: Independent;To bed Details for Transfer Assistance: No cues or assist needed.  Safe with transfers Ambulation/Gait Ambulation/Gait Assistance: 7: Independent Ambulation Distance (Feet): 280 Feet Assistive device: None Ambulation/Gait Assistance Details: No physical assist needed.  Good gait pattern and balance. Gait Pattern: Within Functional Limits Gait velocity: WLF      PT Goals  N/A  Visit Information  Last PT Received On: 10/18/12 Assistance Needed: +1    Subjective Data  Subjective: "I'd like to start playing tennis again." Patient Stated Goal: To go home soon.   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, advertising Overall Cognitive Status: Appears within functional limits for tasks assessed/performed Arousal/Alertness: Awake/alert Orientation Level: Oriented X4 / Intact Behavior During Session: WFL for tasks performed    Extremity/Trunk Assessment Right Upper Extremity Assessment RUE ROM/Strength/Tone: WFL for tasks assessed RUE Sensation: WFL - Light Touch Left Upper Extremity Assessment LUE ROM/Strength/Tone: WFL for tasks assessed LUE Sensation: WFL - Light Touch Right Lower Extremity Assessment RLE ROM/Strength/Tone: WFL for tasks assessed RLE Sensation: WFL - Light Touch Left Lower Extremity Assessment LLE ROM/Strength/Tone: WFL for tasks assessed LLE Sensation: WFL - Light Touch Trunk Assessment Trunk Assessment: Normal   Balance Balance Balance Assessed: Yes High Level Balance High Level Balance Activites: Backward walking;Direction changes;Turns;Sudden stops;Head turns (Stepping over obstacles) High Level Balance Comments: No loss of balance with high level balance activities.  End of Session PT - End of Session Activity Tolerance: Patient tolerated treatment well Patient left: in bed;with call bell/phone within reach Nurse Communication: Mobility status (IV bleeding; Encouraged ambulation with nursing/family)  GP     Vena Austria 10/18/2012, 11:32 AM Durenda Hurt. Renaldo Fiddler, [facility] Acute Rehab  Services Pager [phone]

## 2012-10-19 ENCOUNTER — Inpatient Hospital Stay (HOSPITAL_COMMUNITY): Payer: BC Managed Care – PPO

## 2012-10-19 ENCOUNTER — Encounter (HOSPITAL_COMMUNITY): Payer: Self-pay | Admitting: Physician Assistant

## 2012-10-19 DIAGNOSIS — I10 Essential (primary) hypertension: Secondary | ICD-10-CM

## 2012-10-19 DIAGNOSIS — I4891 Unspecified atrial fibrillation: Secondary | ICD-10-CM

## 2012-10-19 DIAGNOSIS — E1101 Type 2 diabetes mellitus with hyperosmolarity with coma: Secondary | ICD-10-CM

## 2012-10-19 LAB — BASIC METABOLIC PANEL
BUN: 14 mg/dL (ref 6–23)
CO2: 18 mEq/L — ABNORMAL LOW (ref 19–32)
CO2: 18 mEq/L — ABNORMAL LOW (ref 19–32)
Calcium: 9 mg/dL (ref 8.4–10.5)
Calcium: 8.6 mg/dL (ref 8.4–10.5)
Chloride: 96 mEq/L (ref 96–112)
Creatinine, Ser: 0.93 mg/dL (ref 0.50–1.35)
GFR calc Af Amer: >90 mL/min (ref >90)
GFR calc Af Amer: >90 mL/min (ref >90)
GFR calc non Af Amer: 88 mL/min — ABNORMAL LOW (ref >90)
GFR calc non Af Amer: 62 mL/min — ABNORMAL LOW (ref >90)
Glucose, Bld: 328 mg/dL — ABNORMAL HIGH (ref 70–99)
Potassium: 2.9 mEq/L — ABNORMAL LOW (ref 3.5–5.1)
Potassium: 3.8 mEq/L (ref 3.5–5.1)
Sodium: 136 mEq/L (ref 135–145)
Sodium: 136 mEq/L (ref 135–145)

## 2012-10-19 LAB — CBC
HCT: 41.8 % (ref 39.0–52.0)
Hemoglobin: 14.3 g/dL (ref 13.0–17.0)
MCV: 93.7 fL (ref 78.0–100.0)
RBC: 4.46 MIL/uL (ref 4.22–5.81)
RDW: 12.9 % (ref 11.5–15.5)
WBC: 9.3 10*3/uL (ref 4.0–10.5)

## 2012-10-19 LAB — GLUCOSE, CAPILLARY
Glucose-Capillary: 303 mg/dL — ABNORMAL HIGH (ref 70–99)
Glucose-Capillary: 284 mg/dL — ABNORMAL HIGH (ref 70–99)

## 2012-10-19 LAB — LIPID PANEL
HDL: 38 mg/dL — ABNORMAL LOW (ref >39)
LDL Cholesterol: 133 mg/dL — ABNORMAL HIGH (ref 0–99)
Total CHOL/HDL Ratio: 5.9 RATIO
Triglycerides: 258 mg/dL — ABNORMAL HIGH (ref <150)

## 2012-10-19 LAB — MAGNESIUM: Magnesium: 1.8 mg/dL (ref 1.5–2.5)

## 2012-10-19 LAB — PROTIME-INR: INR: 0.91 (ref 0.00–1.49)

## 2012-10-19 LAB — HEPARIN LEVEL (UNFRACTIONATED): Heparin Unfractionated: 0.8 IU/mL — ABNORMAL HIGH (ref 0.30–0.70)

## 2012-10-19 LAB — T4, FREE: Free T4: 1.63 ng/dL (ref 0.80–1.80)

## 2012-10-19 MED ORDER — MORPHINE SULFATE 2 MG/ML IJ SOLN
INTRAMUSCULAR | Status: AC
Start: 2012-10-19 — End: 2012-10-19
  Administered 2012-10-19: 2 mg via INTRAVENOUS
  Filled 2012-10-19: qty 1

## 2012-10-19 MED ORDER — HEPARIN (PORCINE) IN NACL 100-0.45 UNIT/ML-% IJ SOLN
1150.0000 [IU]/h | INTRAMUSCULAR | Status: AC
  Administered 2012-10-19: 1300 [IU]/h via INTRAVENOUS
  Administered 2012-10-21: 1000 [IU]/h via INTRAVENOUS
  Filled 2012-10-19 (×5): qty 250

## 2012-10-19 MED ORDER — IOHEXOL 350 MG/ML SOLN
100.0000 mL | Freq: Once | INTRAVENOUS | Status: AC | PRN
  Administered 2012-10-19: 100 mL via INTRAVENOUS

## 2012-10-19 MED ORDER — POTASSIUM CHLORIDE CRYS ER 20 MEQ PO TBCR
20.0000 meq | EXTENDED_RELEASE_TABLET | Freq: Two times a day (BID) | ORAL | Status: DC
  Administered 2012-10-19: 20 meq via ORAL
  Filled 2012-10-19: qty 1

## 2012-10-19 MED ORDER — GLUCERNA SHAKE PO LIQD
237.0000 mL | Freq: Two times a day (BID) | ORAL | Status: DC
  Administered 2012-10-20 – 2012-10-21 (×4): 237 mL via ORAL

## 2012-10-19 MED ORDER — INSULIN ASPART 100 UNIT/ML ~~LOC~~ SOLN
0.0000 [IU] | Freq: Three times a day (TID) | SUBCUTANEOUS | Status: DC
  Administered 2012-10-19 (×2): 11 [IU] via SUBCUTANEOUS
  Administered 2012-10-19 – 2012-10-20 (×4): 8 [IU] via SUBCUTANEOUS
  Administered 2012-10-21: 5 [IU] via SUBCUTANEOUS
  Administered 2012-10-21: 11 [IU] via SUBCUTANEOUS

## 2012-10-19 MED ORDER — MORPHINE SULFATE 2 MG/ML IJ SOLN: 2.0000 mg | INTRAMUSCULAR | Status: DC | PRN

## 2012-10-19 MED ORDER — METOPROLOL SUCCINATE ER 50 MG PO TB24
50.0000 mg | ORAL_TABLET | Freq: Every day | ORAL | Status: DC
  Administered 2012-10-20: 50 mg via ORAL
  Filled 2012-10-19: qty 1

## 2012-10-19 MED ORDER — ASPIRIN 81 MG PO CHEW: 324.0000 mg | CHEWABLE_TABLET | Freq: Once | ORAL | Status: AC

## 2012-10-19 MED ORDER — INSULIN DETEMIR 100 UNIT/ML ~~LOC~~ SOLN
15.0000 [IU] | Freq: Every day | SUBCUTANEOUS | Status: DC
  Administered 2012-10-19: 15 [IU] via SUBCUTANEOUS
  Filled 2012-10-19 (×2): qty 0.15

## 2012-10-19 MED ORDER — ASPIRIN 81 MG PO CHEW
CHEWABLE_TABLET | ORAL | Status: AC
  Administered 2012-10-19: 324 mg via ORAL
  Filled 2012-10-19: qty 4

## 2012-10-19 MED ORDER — NITROGLYCERIN 0.4 MG SL SUBL
0.4000 mg | SUBLINGUAL_TABLET | SUBLINGUAL | Status: DC | PRN
Start: 2012-10-19 — End: 2012-10-21

## 2012-10-19 MED ORDER — DIPHENHYDRAMINE HCL 25 MG PO CAPS
25.0000 mg | ORAL_CAPSULE | ORAL | Status: DC | PRN
Start: 2012-10-19 — End: 2012-10-21
  Administered 2012-10-19: 25 mg via ORAL
  Filled 2012-10-19: qty 1

## 2012-10-19 MED ORDER — SALINE SPRAY 0.65 % NA SOLN
1.0000 | NASAL | Status: DC | PRN
  Filled 2012-10-19: qty 44

## 2012-10-19 MED ORDER — ASPIRIN 81 MG PO CHEW: 324.0000 mg | CHEWABLE_TABLET | Freq: Every day | ORAL | Status: DC

## 2012-10-19 MED ORDER — POTASSIUM CHLORIDE CRYS ER 20 MEQ PO TBCR
40.0000 meq | EXTENDED_RELEASE_TABLET | Freq: Once | ORAL | Status: AC
  Administered 2012-10-19: 40 meq via ORAL
  Filled 2012-10-19: qty 2

## 2012-10-19 MED ORDER — DILTIAZEM LOAD VIA INFUSION
10.0000 mg | Freq: Once | INTRAVENOUS | Status: DC
  Filled 2012-10-19: qty 10

## 2012-10-19 MED ORDER — HEPARIN BOLUS VIA INFUSION
2000.0000 [IU] | Freq: Once | INTRAVENOUS | Status: AC
  Administered 2012-10-19: 2000 [IU] via INTRAVENOUS
  Filled 2012-10-19: qty 2000

## 2012-10-19 MED ORDER — ASPIRIN 81 MG PO CHEW
81.0000 mg | CHEWABLE_TABLET | Freq: Every day | ORAL | Status: DC
  Administered 2012-10-20 – 2012-10-21 (×2): 81 mg via ORAL
  Filled 2012-10-19 (×2): qty 1

## 2012-10-19 MED ORDER — DEXTROSE 5 % IV SOLN
5.0000 mg/h | INTRAVENOUS | Status: DC
  Administered 2012-10-19 (×2): 5 mg/h via INTRAVENOUS
  Filled 2012-10-19: qty 100

## 2012-10-19 MED ORDER — METOPROLOL TARTRATE 1 MG/ML IV SOLN: 5.0000 mg | Freq: Once | INTRAVENOUS | Status: DC

## 2012-10-19 NOTE — Consult Note (Signed)
CARDIOLOGY CONSULT NOTE  Patient ID: Mcguire Gasparyan, MRN: [id number], DOB/AGE: 1951-07-07 62 y.o. 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 He presented to [facility] 10/17/12 with 2 days of intermittent confusion, polydipsia, polyuria, and was found to have CBG of 511 (A1C 13.7). Initial EKG showed sinus brady 46bpm. He was admitted for acute metabolic encephalopathy felt due to hyperosmolar non-ketotic state. CT head without definite acute abnormality but did show white matter hypodensities, a nonspecific finding most often seen with chronic microangiopathic change, and sinus changes (?fungal rhinitis vs neoplasm) which is pending ENT eval today. Labs significant for K 3.3 yesterday, WBC 11.6, HIV nonreactive, admit lactic acid WNL, troponin neg x 1. He states that last night he was given IV hydralazine and developed SOB/restlessness. This morning, HR was 78 at 05:30 and BP was 170/76. He subsequently received a dose IV hydralazine and then developed chest pressure and dyspnea. He was placed on telemetry and found to have rapid afib, EKG showing afib 122bpm with LVH, repol abnl. He denies palpitations but has felt a rapid HR with activity in the past. He denies any exertional CP or SOB with usual housework/ADLs (does not exercise). No h/o TIA, CVA, bleeding problems.  2D Echo 3/22: EF 65-70% with severe LVH and hyperdynamic LV function, LVOT gradient at rest of approximately 2.5 m/s, RWMA cannot be excluded, not all apical subcostal views  obtained due to confusion/cooperation.  Past Medical History  Diagnosis Date  . GERD (gastroesophageal reflux disease)   . Allergy   . Diverticulitis   . History of colon polyps   . COPD (chronic obstructive pulmonary disease)   . Cardiomyopathy 2011    Patient was told that he had an ejection fraction of 37% by echo done elsewhere in 2011  . Hypertension   . Tobacco abuse   . Asthma       Most Recent Cardiac Studies: THIS ADMISSION 2D Echo 10/17/12 Study Conclusions - Left ventricle: The cavity size was normal. Wall thickness was increased in a pattern of severe LVH. Systolic function was vigorous. The estimated ejection fraction was in the range of 65% to 70%. Regional wall motion abnormalities cannot be excluded. Doppler parameters are consistent with abnormal left ventricular relaxation (grade 1 diastolic dysfunction). - Left atrium: The atrium was mildly dilated. Impressions: - Severe LVH with hyperdynamic LV function; LVOT gradient at rest of approximately 2.5 m/s. Note all apical subcostal views not obtained as patient as patient confused and could not cooperate with study.  [facility] ?prior history   Surgical History:  Past Surgical History  Procedure Laterality Date  . Colon surgery  2009     Home Meds: Prior to Admission medications   Medication Sig Start Date End Date Taking? Authorizing Provider  albuterol (PROVENTIL HFA;VENTOLIN HFA) 108 (90 BASE) MCG/ACT inhaler Inhale 2 puffs into the lungs every 6 (six) hours as needed for wheezing.   Yes Historical Provider, MD  aspirin EC 81 MG tablet Take 81 mg by mouth daily.   Yes Historical Provider, MD  fish oil-omega-3 fatty acids 1000 MG capsule Take 2 g by mouth daily.    Yes Historical Provider, MD  hydrochlorothiazide (HYDRODIURIL) 25 MG tablet Take 25 mg by mouth daily.   Yes Historical Provider, MD  losartan (COZAAR) 100 MG tablet Take 100 mg by mouth daily.   Yes Historical Provider, MD  metoprolol succinate  (TOPROL-XL) 50 MG 24 hr tablet Take 50 mg by mouth daily. Take with or immediately following a meal.   Yes Historical Provider, MD  omeprazole (PRILOSEC) 20 MG capsule Take 20 mg by mouth daily.   Yes Historical Provider, MD    Inpatient Medications:  . [START ON 10/20/2012] aspirin  81 mg Oral Daily  . diltiazem  10 mg Intravenous Once  . feeding supplement  237 mL Oral BID BM  . insulin aspart  0-15 Units Subcutaneous TID WC  . insulin detemir  15 Units Subcutaneous Daily  . losartan  100 mg Oral Daily  . metoprolol succinate  50 mg Oral Daily  . omega-3 acid ethyl esters  2 g Oral Daily  . pantoprazole  40 mg Oral Daily  . potassium chloride  20 mEq Oral BID  . senna  1 tablet Oral BID  . sodium chloride  3 mL Intravenous Q12H    Allergies:  Allergies  Allergen Reactions  . Lisinopril Cough  . Penicillins     Whelps  . Tribenzor (Olmesartan-Amlodipine-Hctz) Nausea Only    dizziness    History   Social History  . Marital Status: Divorced    Spouse Name: N/A    Number of Children: N/A  . Years of Education: N/A   Occupational History  . Not on file.   Social History Main Topics  . Smoking status: Current Every Day Smoker    Types: Cigarettes  . Smokeless tobacco: Never Used     Comment: Smoked since age 62  . Alcohol Use: 0.0 oz/week     Comment: ? pt states 1x/month.   . Drug Use: No  . Sexually Active: Not Currently   Other Topics Concern  . Not on file   Social History Narrative  . No narrative on file     Family History  Problem Relation Age of Onset  . Arthritis Other     parents  . Cancer Other     Colon,Breast, Prostate Cancer  . Hypertension Other   . Diabetes Other     parents  . Colon cancer Mother   . Prostate cancer Father      Review of Systems: General: negative for chills, fever, night sweats. 10lb weight loss in 2 weeks Cardiovascular: see above. No orthopnea, LEE, PND Dermatological: negative for rash Respiratory: negative for  cough or wheezing Urologic: negative for hematuria Abdominal: negative for vomiting, diarrhea, bright red blood per rectum, melena, or hematemesis. Mild nausea Neurologic: negative for visual changes, syncope, or dizziness All other systems reviewed and are otherwise negative except as noted above.  Labs: tropn neg x 1 on adm Lab Results  Component Value Date   WBC 11.6* 10/18/2012   HGB 13.9 10/18/2012   HCT 40.3 10/18/2012   MCV 93.1 10/18/2012   PLT 233 10/18/2012     Recent Labs Lab 10/18/12 0645  NA 141  K 3.3*  CL 102  CO2 21  BUN 16  CREATININE 1.07  CALCIUM 9.0  PROT 6.7  BILITOT 1.7*  ALKPHOS 96  ALT 14  AST 20  GLUCOSE 295*   Lab Results  Component  Value Date   CHOL 183 05/05/2012   HDL 48.60 05/05/2012   TRIG 247.0* 05/05/2012    Radiology/Studies:  Dg Chest 1 View 10/18/2012  *RADIOLOGY REPORT*  Clinical Data: Altered mental status  CHEST - 1 VIEW  Comparison: 05/12/2012  Findings: Cardiomediastinal silhouette is stable.  No acute infiltrate or pleural effusion.  No pulmonary edema.  Bony thorax is unremarkable.  IMPRESSION: No active disease.   Original Report Authenticated By: Natasha Mead, M.D.    Ct Head Wo Contrast 10/17/2012  *RADIOLOGY REPORT*  Clinical Data: Confusion  CT HEAD WITHOUT CONTRAST  Technique:  Contiguous axial images were obtained from the base of the skull through the vertex without contrast.  Comparison: None.  Findings: Periventricular and subcortical white matter hypodensities are nospecific, however, most in keeping with chronic microangiopathic change.  There is no evidence for acute hemorrhage, hydrocephalus, mass lesion, or abnormal extra-axial fluid collection.  No definite CT evidence for acute infarction. Opacified left maxillary sinus, anterior left ethmoid air cells, and left frontal sinus with high attenuation.  Mildly thickened posterior left maxillary sinus wall.  Medial wall appears diminutive/eroded.  Right paranasal sinuses and  mastoid air cells are predominately clear.  IMPRESSION: White matter hypodensities, a nonspecific finding most often seen with chronic microangiopathic change.  No definite CT evidence of acute intracranial abnormality.  High attenuation opacification of the left paranasal sinuses may reflect inspissated secretions, fungal colonization, or blood products. Correlate with symptoms and dedicated sinus imaging if warranted.   Original Report Authenticated By: Jearld Lesch, M.D.    Ct Maxillofacial Wo Cm 10/18/2012  *RADIOLOGY REPORT*  Clinical Data: Sinus congestion.  Evaluate left maxillary sinus. Abnormal CT head  CT MAXILLOFACIAL WITHOUT CONTRAST  Technique:  Multidetector CT imaging of the maxillofacial structures was performed. Multiplanar CT image reconstructions were also generated.  Comparison: CT head 10/17/2012  Findings: Left maxillary sinus is completely filled with high density material.  This is extending through the medial antral wall into the left nasal cavity.  This also extends into the left ethmoid sinus  and left frontal sinus where there is high density material.  There is destruction/remodeling of the medial left antral wall.  There is no intraorbital extension.  Mild mucosal edema right maxillary sinus.  No air-fluid levels are present.  Right ethmoid sphenoid and frontal sinuses are clear.  IMPRESSION: Left-sided opacification of the maxillary ethmoid and frontal sinuses with high density secretions/tumor.  There is remodeling/destruction of the medial wall of the left maxillary antrum.  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 Neoplasm is a possibility given the given the bony changes.  ENT consultation suggested.   MRI of the face without and  with contrast may be helpful to evaluate for underlying neoplasm and invasive fungal sinusitis which is not identified on CT.   Original Report Authenticated By: Janeece Riggers, M.D.    EKG: initial: SB 46bpm LVH with IVCD and secondary  repol abnl. TW changes V2-V5 new from prior EKG F/u today: atrial fib 122bph LVH with repol abnormality - anterior TW changes as above improved.  Physical Exam: Blood pressure 118/64, pulse 135, temperature 98.1 F (36.7 C), temperature source Oral, resp. rate 18, height 5\' 10"  (1.778 m), weight 191 lb 5.8 oz (86.8 kg), SpO2 100.00%. General: Well developed, well nourished AAM in no acute distress. Head: Normocephalic, atraumatic, sclera non-icteric, no xanthomas, nares are without discharge.  Neck: Negative for carotid bruits. JVD not elevated. Lungs: Diminished BS at bases, no  wheezes rales or rhonchi. He does speak in long broken sentences but not in acute respiratory distress Heart: Irregularly irregular, tachycardic, with S1 S2. 2/6 SEM at LLSB. No rubs or gallops appreciated. Abdomen: Soft, non-tender, non-distended with normoactive bowel sounds. No hepatomegaly. No rebound/guarding. No obvious abdominal masses. Msk:  Strength and tone appear normal for age. Extremities: No clubbing or cyanosis. No edema.  Distal pedal pulses are 2+ and equal bilaterally. Neuro: Alert and oriented X 3. No facial asymmetry. No focal deficit. Moves all extremities spontaneously. Psych:  Responds to questions appropriately with a normal affect.   Assessment and Plan:   1. Uncontrolled diabetes mellitus with hyperosmolar non-ketotic state - per IM. 2. Metabolic encephalopathy felt secondary to #1 - improved. 3. Severe LVH - less likely to tolerate RVR. Will attempt to control rate and BP better. 4. Newly recognized afib RVR - start IV cardizem with 10mg  bolus and then gtt. Transfer to SDU to allow for dose titration. Add heparin (CHADS2 = at least 2 for diabetes, HTN... Unclear h/o of cardiomyopathy). Check TSH, free T4, d-dimer, and cardiac enzymes. Given bradycardia on admit ECG, will need to watch for bradycardia (although that HR was in setting of lyte abnormalities). 5. Dyspnea/chest pressure - may be  secondary to number 4 but will cycle cardiac enzymes, add aspirin, add heparin, continue BB. If enz +, will need cath. Will also check d-dimer in the event that he has developed a PE since admission (if +, needs CTA).  6. HTN - diltiazem being added. Cont BB. Avoid hydralazine. 7. Hypokalemia - recheck BMET this AM and replete if necessary. 8. COPD - stable. Not wheezing. 9. Sinus abnormality on CT - pending ENT eval. 10. Weight loss - likely due to #1, but should f/u PCP.  Signed, Dayna Dunn PA-C 10/19/2012, 9:23 AM As above, patient seen and examined. Briefly he is a 62 year old male with a past medical history of hypertension, hyperlipidemia, COPD and recently diagnosed diabetes mellitus for evaluation of atrial fibrillation and chest pain. Patient was admitted on March 22 with acute encephalopathy felt related to hyperosmolar nonketotic state. He has been noted to have questionable fungal sinusitis versus neoplasm and ENT consult is pending. He typically does not have dyspnea, chest pain or palpitations. Following a dose of hydralazine this morning he developed chest pressure and dyspnea. His electrocardiogram showed atrial fibrillation the rapid ventricular response. Cardiology is asked to evaluate. Echocardiogram from this admission was technically difficult as the patient was confused and could not cooperate. However this showed an ejection fraction of 65-70%, severe left ventricular hypertrophy and an LVOT gradient of 2.5 m/s. Plan to transfer to step down. Add IV Cardizem for rate control. Continue aspirin and heparin. Check TSH. Hopefully he will convert on his own. He will need long-term anticoagulation as he has embolic risk factors of hypertension and diabetes mellitus. He has multiple risk factors and is complaining of chest pressure with his elevated heart rate. Cycle enzymes. If abnormal he will need cardiac catheterization. If normal he will need a functional study for risk stratification.  Patient counseled on discontinuing tobacco abuse. We will check a d-dimer to screen for pulmonary embolus as a cause of his new onset atrial fibrillation given that he has been hospitalized for the past 48 hours. Olga Millers 9:52 AM

## 2012-10-19 NOTE — Progress Notes (Signed)
INITIAL NUTRITION ASSESSMENT  DOCUMENTATION CODES Per approved criteria  -Not Applicable   INTERVENTION: Glucerna Shake BID which will provide 440 kcal and 20 grams of protein  NUTRITION DIAGNOSIS: Unintentional weight loss related to decreased appetite as evidenced by 12 pound weight loss (6% body weight) in 5 months.   Goal: Intake to meet >/=90% estimated nutrition needs  Monitor:  Tolerance of Glucerna Shake, PO's, weight trends, I/O's, labs  Reason for Assessment: Malnutrition Screening Tool  62 y.o. male  Admitting Dx: Hyperosmolar non-ketotic state in patient with type 2 diabetes mellitus  ASSESSMENT: Pt presented to the ED with c/o altered mental status and report of polyuria and polydipsia x2 months.  Pt with new diagnosis of Type II DM and hx of cardiomyopathy.   Pt states usual weight of 201 pounds and that he feels that he has lost weight recently, pt currently weighs 191 pounds. He states that he has had a poor appetite for the past few months. Pt reports that he has tried Ensure in the past and that he tolerated it well. He is interested in trying Glucerna Shake while he is here, will order this BID.   Pt will require DM diet education. Pt not appropriate for education at this time as he states he is very tired and feeling extremely SOB.  Height: Ht Readings from Last 1 Encounters:  10/18/12 5\' 10"  (1.778 m)    Weight: Wt Readings from Last 1 Encounters:  10/19/12 191 lb 5.8 oz (86.8 kg)    Ideal Body Weight: 166 lbs  % Ideal Body Weight: 115%  Wt Readings from Last 10 Encounters:  10/19/12 191 lb 5.8 oz (86.8 kg)  05/05/12 203 lb 8 oz (92.307 kg)  08/27/11 205 lb (92.987 kg)  06/26/11 200 lb (90.719 kg)    Usual Body Weight: 201 pounds, per patient report  % Usual Body Weight: 95%  BMI:  Body mass index is 27.46 kg/(m^2).--Overweight  Estimated Nutritional Needs: Kcal: 1800-2000 Protein: 90-100 grams Fluid: 1.8-2.0 L/day  Skin: intact,  no wounds noted  Diet Order: Carb Control, Medium  EDUCATION NEEDS: -Education not appropriate at this time, will need DM diet education   Intake/Output Summary (Last 24 hours) at 10/19/12 0836 Last data filed at 10/19/12 0506  Gross per 24 hour  Intake   2145 ml  Output      0 ml  Net   2145 ml    Last BM: 3/23  Labs:   Recent Labs Lab 10/17/12 1005 10/17/12 1030 10/17/12 1501 10/18/12 0645  NA 136 138  --  141  K 3.7 3.7  --  3.3*  CL 93* 98  --  102  CO2 26  --   --  21  BUN 20 22  --  16  CREATININE 1.26 1.30 1.04 1.07  CALCIUM 9.3  --   --  9.0  GLUCOSE 511* 505*  --  295*    CBG (last 3)   Recent Labs  10/18/12 1644 10/18/12 2124 10/19/12 0740  GLUCAP 373* 321* 303*    Scheduled Meds: . aspirin  324 mg Oral Daily  . heparin  5,000 Units Subcutaneous Q8H  . insulin aspart  0-15 Units Subcutaneous TID WC  . insulin detemir  15 Units Subcutaneous Daily  . losartan  100 mg Oral Daily  . metoprolol succinate  50 mg Oral Daily  . omega-3 acid ethyl esters  2 g Oral Daily  . pantoprazole  40 mg Oral Daily  .  potassium chloride  20 mEq Oral BID  . senna  1 tablet Oral BID  . sodium chloride  3 mL Intravenous Q12H    Continuous Infusions:   Past Medical History  Diagnosis Date  . GERD (gastroesophageal reflux disease)   . Allergy   . Diverticulitis   . History of colon polyps   . COPD (chronic obstructive pulmonary disease)   . Cardiomyopathy 2011    Patient was told that he had an ejection fraction of 37% by echo done elsewhere in 2011  . Hypertension   . Tobacco abuse   . Asthma     Past Surgical History  Procedure Laterality Date  . Colon surgery  2009    [facility] Dietetic Intern # [phone]

## 2012-10-19 NOTE — Progress Notes (Signed)
Called into room because pt was experiencing shortness of breath and dull ache in chest. Paged on call.

## 2012-10-19 NOTE — Care Management Note (Signed)
    Page 1 of 1   10/21/2012     3:51:43 PM   CARE MANAGEMENT NOTE 10/21/2012  Patient:  [name] [name]   Account Number:  [id number]  Date Initiated:  10/19/2012  Documentation initiated by:  [name] [name]  Subjective/Objective Assessment:   dx hyperosmolar non-ketotic DM,  new onset afib.  admit- lives with friend.     Action/Plan:   pt eval- no pt follow up.   Anticipated DC Date:  10/21/2012   Anticipated DC Plan:  HOME W HOME HEALTH SERVICES      DC Planning Services  CM consult      Choice offered to / List presented to:             Status of service:  Completed, signed off Medicare Important Message given?   (If response is "NO", the following Medicare IM given date fields will be blank) Date Medicare IM given:   Date Additional Medicare IM given:    Discharge Disposition:  HOME/SELF CARE  Per UR Regulation:  Reviewed for med. necessity/level of care/duration of stay  If discussed at Long Length of Stay Meetings, dates discussed:    Comments:  10/21/12 [name] Bahner,RN,BSN [phone] PT FOR DC TODAY.  TO DC ON [place] FOR AFIB.  [place] CAREPATH CARD GIVEN TO PT FOR RX DRUG COPAY SAVINGS.  10/19/12 10:51 [name] Cape RN, BSN [phone] patient lives with friend, per physical therpay patient has no pt needs.   Patient now with new onset afib with RVR, will start cardizem gtt and transfer to SDU.  NCM will continue to follow for dc needs.

## 2012-10-19 NOTE — Progress Notes (Signed)
Agree with dietetic intern note. Weight loss and poor po intake likely related to new onset DM. Expect weight stability and increased appetite with glucose control. RD will continue to follow and provide diet education once more appropriate.    Clarene Duke RD, LDN Pager [phone] After Hours pager [phone]

## 2012-10-19 NOTE — Consult Note (Signed)
Reason for Consult: Left sinus mass Referring Physician: Ricke Hey, MD  HPI:  [name] [name] is an 62 y.o. male  who was admitted on 10/17/12 for treatment of altered mental status secondary to acute metabolic encephalopathy and hyperosmolar non-ketotic state.  The patient has type 2 diabetes mellitus.  The patient was noted incidentally on his head CT scan to have complete opacification of his left paranasal sinuses. His dedicated facial CT shows complete opacification of his the left frontal sinus, left maxillary sinus, and left ethmoid sinuses. The CT findings are concerning for fungal sinusitis versus sinus tumor.  The patient reports history of occasional sinusitis. However he has no previous history of sinonasal surgery. He complains of occasional left-sided nasal mucosal congestion. He is currently not on any nasal medication. Currently he denies any facial pain, pressure, visual change, or fever.   Past Medical History  Diagnosis Date  . GERD (gastroesophageal reflux disease)   . Allergy   . Diverticulitis   . History of colon polyps   . COPD (chronic obstructive pulmonary disease)   . Cardiomyopathy 2011    Patient was told that he had an ejection fraction of 37% by echo done elsewhere in 2011  . Hypertension   . Tobacco abuse   . Asthma     Past Surgical History  Procedure Laterality Date  . Colon surgery  2009    Family History  Problem Relation Age of Onset  . Arthritis Other     parents  . Cancer Other     Colon,Breast, Prostate Cancer  . Hypertension Other   . Diabetes Other     parents  . Colon cancer Mother   . Prostate cancer Father     Social History:  reports that he has been smoking Cigarettes.  He has been smoking about 0.00 packs per day. He has never used smokeless tobacco. He reports that  drinks alcohol. He reports that he does not use illicit drugs.  Allergies:  Allergies  Allergen Reactions  . Lisinopril Cough  . Penicillins     Whelps  .  Tribenzor (Olmesartan-Amlodipine-Hctz) Nausea Only    dizziness    Medications:  I have reviewed the patient's current medications. Scheduled: . [START ON 10/20/2012] aspirin  81 mg Oral Daily  . diltiazem  10 mg Intravenous Once  . feeding supplement  237 mL Oral BID BM  . insulin aspart  0-15 Units Subcutaneous TID WC  . insulin detemir  15 Units Subcutaneous Daily  . losartan  100 mg Oral Daily  . metoprolol succinate  50 mg Oral Daily  . omega-3 acid ethyl esters  2 g Oral Daily  . pantoprazole  40 mg Oral Daily  . potassium chloride  20 mEq Oral BID  . senna  1 tablet Oral BID  . sodium chloride  3 mL Intravenous Q12H   WUJ:[id number], acetaminophen, albuterol, dextrose, morphine injection, nitroGLYCERIN, ondansetron (ZOFRAN) IV, ondansetron  Results for orders placed during the hospital encounter of 10/17/12 (from the past 48 hour(s))  GLUCOSE, CAPILLARY     Status: Abnormal   Collection Time    10/17/12  9:57 AM      Result Value Range   Glucose-Capillary 544 (*) 70 - 99 mg/dL   Comment 1 Notify RN    CBC WITH DIFFERENTIAL     Status: Abnormal   Collection Time    10/17/12 10:05 AM      Result Value Range   WBC 7.8  4.0 - 10.5 K/uL  RBC 4.67  4.22 - 5.81 MIL/uL   Hemoglobin 14.7  13.0 - 17.0 g/dL   HCT 16.1  09.6 - 04.5 %   MCV 90.8  78.0 - 100.0 fL   MCH 31.5  26.0 - 34.0 pg   MCHC 34.7  30.0 - 36.0 g/dL   RDW 40.9  81.1 - 91.4 %   Platelets 241  150 - 400 K/uL   Neutrophils Relative 81 (*) 43 - 77 %   Neutro Abs 6.3  1.7 - 7.7 K/uL   Lymphocytes Relative 15  12 - 46 %   Lymphs Abs 1.2  0.7 - 4.0 K/uL   Monocytes Relative 3  3 - 12 %   Monocytes Absolute 0.3  0.1 - 1.0 K/uL   Eosinophils Relative 1  0 - 5 %   Eosinophils Absolute 0.1  0.0 - 0.7 K/uL   Basophils Relative 0  0 - 1 %   Basophils Absolute 0.0  0.0 - 0.1 K/uL  COMPREHENSIVE METABOLIC PANEL     Status: Abnormal   Collection Time    10/17/12 10:05 AM      Result Value Range   Sodium 136   135 - 145 mEq/L   Potassium 3.7  3.5 - 5.1 mEq/L   Chloride 93 (*) 96 - 112 mEq/L   CO2 26  19 - 32 mEq/L   Glucose, Bld 511 (*) 70 - 99 mg/dL   BUN 20  6 - 23 mg/dL   Creatinine, Ser 7.82  0.50 - 1.35 mg/dL   Calcium 9.3  8.4 - 95.6 mg/dL   Total Protein 7.8  6.0 - 8.3 g/dL   Albumin 3.7  3.5 - 5.2 g/dL   AST 15  0 - 37 U/L   ALT 16  0 - 53 U/L   Alkaline Phosphatase 124 (*) 39 - 117 U/L   Total Bilirubin 1.3 (*) 0.3 - 1.2 mg/dL   GFR calc non Af Amer 59 (*) >90 mL/min   GFR calc Af Amer 68 (*) >90 mL/min   Comment:            The eGFR has been calculated     using the CKD EPI equation.     This calculation has not been     validated in all clinical     situations.     eGFR's persistently     <90 mL/min signify     possible Chronic Kidney Disease.  LACTIC ACID, PLASMA     Status: None   Collection Time    10/17/12 10:06 AM      Result Value Range   Lactic Acid, Venous 1.8  0.5 - 2.2 mmol/L  URINALYSIS, ROUTINE W REFLEX MICROSCOPIC     Status: Abnormal   Collection Time    10/17/12 10:25 AM      Result Value Range   Color, Urine YELLOW  YELLOW   APPearance CLEAR  CLEAR   Specific Gravity, Urine 1.039 (*) 1.005 - 1.030   pH 5.0  5.0 - 8.0   Glucose, UA >1000 (*) NEGATIVE mg/dL   Hgb urine dipstick TRACE (*) NEGATIVE   Bilirubin Urine NEGATIVE  NEGATIVE   Ketones, ur >80 (*) NEGATIVE mg/dL   Protein, ur NEGATIVE  NEGATIVE mg/dL   Urobilinogen, UA 0.2  0.0 - 1.0 mg/dL   Nitrite NEGATIVE  NEGATIVE   Leukocytes, UA NEGATIVE  NEGATIVE  URINE MICROSCOPIC-ADD ON     Status: None   Collection Time  10/17/12 10:25 AM      Result Value Range   Squamous Epithelial / LPF RARE  RARE  POCT I-STAT, CHEM 8     Status: Abnormal   Collection Time    10/17/12 10:30 AM      Result Value Range   Sodium 138  135 - 145 mEq/L   Potassium 3.7  3.5 - 5.1 mEq/L   Chloride 98  96 - 112 mEq/L   BUN 22  6 - 23 mg/dL   Creatinine, Ser 8.65  0.50 - 1.35 mg/dL   Glucose, Bld 784 (*) 70 -  99 mg/dL   Calcium, Ion 6.96  2.95 - 1.30 mmol/L   TCO2 30  0 - 100 mmol/L   Hemoglobin 15.6  13.0 - 17.0 g/dL   HCT 28.4  13.2 - 44.0 %  POCT I-STAT TROPONIN I     Status: None   Collection Time    10/17/12 10:31 AM      Result Value Range   Troponin i, poc 0.00  0.00 - 0.08 ng/mL   Comment 3            Comment: Due to the release kinetics of cTnI,     a negative result within the first hours     of the onset of symptoms does not rule out     myocardial infarction with certainty.     If myocardial infarction is still suspected,     repeat the test at appropriate intervals.  GLUCOSE, CAPILLARY     Status: Abnormal   Collection Time    10/17/12 11:37 AM      Result Value Range   Glucose-Capillary 440 (*) 70 - 99 mg/dL   Comment 1 Notify RN    GLUCOSE, CAPILLARY     Status: Abnormal   Collection Time    10/17/12  1:07 PM      Result Value Range   Glucose-Capillary 383 (*) 70 - 99 mg/dL   Comment 1 Notify RN    GLUCOSE, CAPILLARY     Status: Abnormal   Collection Time    10/17/12  2:29 PM      Result Value Range   Glucose-Capillary 372 (*) 70 - 99 mg/dL  CBC     Status: Abnormal   Collection Time    10/17/12  3:01 PM      Result Value Range   WBC 11.8 (*) 4.0 - 10.5 K/uL   RBC 4.89  4.22 - 5.81 MIL/uL   Hemoglobin 16.1  13.0 - 17.0 g/dL   HCT 10.2  72.5 - 36.6 %   MCV 92.8  78.0 - 100.0 fL   MCH 32.9  26.0 - 34.0 pg   MCHC 35.5  30.0 - 36.0 g/dL   RDW 44.0  34.7 - 42.5 %   Platelets 263  150 - 400 K/uL  CREATININE, SERUM     Status: Abnormal   Collection Time    10/17/12  3:01 PM      Result Value Range   Creatinine, Ser 1.04  0.50 - 1.35 mg/dL   GFR calc non Af Amer 74 (*) >90 mL/min   GFR calc Af Amer 86 (*) >90 mL/min   Comment:            The eGFR has been calculated     using the CKD EPI equation.     This calculation has not been     validated in all clinical  situations.     eGFR's persistently     <90 mL/min signify     possible Chronic Kidney  Disease.  HEMOGLOBIN A1C     Status: Abnormal   Collection Time    10/17/12  3:01 PM      Result Value Range   Hemoglobin A1C 13.7 (*) <5.7 %   Comment: (NOTE)                                                                               According to the ADA Clinical Practice Recommendations for 2011, when     HbA1c is used as a screening test:      >=6.5%   Diagnostic of Diabetes Mellitus               (if abnormal result is confirmed)     5.7-6.4%   Increased risk of developing Diabetes Mellitus     References:Diagnosis and Classification of Diabetes Mellitus,Diabetes     Care,2011,34(Suppl 1):S62-S69 and Standards of Medical Care in             Diabetes - 2011,Diabetes Care,2011,34 (Suppl 1):S11-S61.   Mean Plasma Glucose 346 (*) <117 mg/dL  GLUCOSE, CAPILLARY     Status: Abnormal   Collection Time    10/17/12  3:33 PM      Result Value Range   Glucose-Capillary 369 (*) 70 - 99 mg/dL   Comment 1 Notify RN    GLUCOSE, CAPILLARY     Status: Abnormal   Collection Time    10/17/12  4:33 PM      Result Value Range   Glucose-Capillary 301 (*) 70 - 99 mg/dL   Comment 1 Notify RN    GLUCOSE, CAPILLARY     Status: Abnormal   Collection Time    10/17/12  4:48 PM      Result Value Range   Glucose-Capillary 266 (*) 70 - 99 mg/dL  GLUCOSE, CAPILLARY     Status: Abnormal   Collection Time    10/17/12  6:05 PM      Result Value Range   Glucose-Capillary 251 (*) 70 - 99 mg/dL   Comment 1 Notify RN    GLUCOSE, CAPILLARY     Status: Abnormal   Collection Time    10/17/12  7:32 PM      Result Value Range   Glucose-Capillary 224 (*) 70 - 99 mg/dL  GLUCOSE, CAPILLARY     Status: Abnormal   Collection Time    10/17/12  8:40 PM      Result Value Range   Glucose-Capillary 173 (*) 70 - 99 mg/dL  GLUCOSE, CAPILLARY     Status: Abnormal   Collection Time    10/17/12  9:47 PM      Result Value Range   Glucose-Capillary 178 (*) 70 - 99 mg/dL  GLUCOSE, CAPILLARY     Status: Abnormal    Collection Time    10/18/12 12:08 AM      Result Value Range   Glucose-Capillary 232 (*) 70 - 99 mg/dL  COMPREHENSIVE METABOLIC PANEL     Status: Abnormal   Collection Time    10/18/12  6:45 AM  Result Value Range   Sodium 141  135 - 145 mEq/L   Potassium 3.3 (*) 3.5 - 5.1 mEq/L   Chloride 102  96 - 112 mEq/L   CO2 21  19 - 32 mEq/L   Glucose, Bld 295 (*) 70 - 99 mg/dL   BUN 16  6 - 23 mg/dL   Creatinine, Ser 1.61  0.50 - 1.35 mg/dL   Calcium 9.0  8.4 - 09.6 mg/dL   Total Protein 6.7  6.0 - 8.3 g/dL   Albumin 3.1 (*) 3.5 - 5.2 g/dL   AST 20  0 - 37 U/L   ALT 14  0 - 53 U/L   Alkaline Phosphatase 96  39 - 117 U/L   Total Bilirubin 1.7 (*) 0.3 - 1.2 mg/dL   GFR calc non Af Amer 72 (*) >90 mL/min   GFR calc Af Amer 83 (*) >90 mL/min   Comment:            The eGFR has been calculated     using the CKD EPI equation.     This calculation has not been     validated in all clinical     situations.     eGFR's persistently     <90 mL/min signify     possible Chronic Kidney Disease.  CBC     Status: Abnormal   Collection Time    10/18/12  6:45 AM      Result Value Range   WBC 11.6 (*) 4.0 - 10.5 K/uL   RBC 4.33  4.22 - 5.81 MIL/uL   Hemoglobin 13.9  13.0 - 17.0 g/dL   HCT 04.5  40.9 - 81.1 %   MCV 93.1  78.0 - 100.0 fL   MCH 32.1  26.0 - 34.0 pg   MCHC 34.5  30.0 - 36.0 g/dL   RDW 91.4  78.2 - 95.6 %   Platelets 233  150 - 400 K/uL  GLUCOSE, CAPILLARY     Status: Abnormal   Collection Time    10/18/12  7:52 AM      Result Value Range   Glucose-Capillary 300 (*) 70 - 99 mg/dL  GLUCOSE, CAPILLARY     Status: Abnormal   Collection Time    10/18/12 11:35 AM      Result Value Range   Glucose-Capillary 320 (*) 70 - 99 mg/dL  HIV ANTIBODY (ROUTINE TESTING)     Status: None   Collection Time    10/18/12  3:58 PM      Result Value Range   HIV NON REACTIVE  NON REACTIVE  GLUCOSE, CAPILLARY     Status: Abnormal   Collection Time    10/18/12  4:44 PM      Result Value  Range   Glucose-Capillary 373 (*) 70 - 99 mg/dL  GLUCOSE, CAPILLARY     Status: Abnormal   Collection Time    10/18/12  9:24 PM      Result Value Range   Glucose-Capillary 321 (*) 70 - 99 mg/dL  GLUCOSE, CAPILLARY     Status: Abnormal   Collection Time    10/19/12  7:40 AM      Result Value Range   Glucose-Capillary 303 (*) 70 - 99 mg/dL   Comment 1 Documented in Chart     Comment 2 Notify RN      Dg Chest 1 View  10/18/2012  *RADIOLOGY REPORT*  Clinical Data: Altered mental status  CHEST - 1 VIEW  Comparison: 05/12/2012  Findings: Cardiomediastinal silhouette is stable.  No acute infiltrate or pleural effusion.  No pulmonary edema.  Bony thorax is unremarkable.  IMPRESSION: No active disease.   Original Report Authenticated By: Natasha Mead, M.D.    Ct Head Wo Contrast  10/17/2012  *RADIOLOGY REPORT*  Clinical Data: Confusion  CT HEAD WITHOUT CONTRAST  Technique:  Contiguous axial images were obtained from the base of the skull through the vertex without contrast.  Comparison: None.  Findings: Periventricular and subcortical white matter hypodensities are nospecific, however, most in keeping with chronic microangiopathic change.  There is no evidence for acute hemorrhage, hydrocephalus, mass lesion, or abnormal extra-axial fluid collection.  No definite CT evidence for acute infarction. Opacified left maxillary sinus, anterior left ethmoid air cells, and left frontal sinus with high attenuation.  Mildly thickened posterior left maxillary sinus wall.  Medial wall appears diminutive/eroded.  Right paranasal sinuses and mastoid air cells are predominately clear.  IMPRESSION: White matter hypodensities, a nonspecific finding most often seen with chronic microangiopathic change.  No definite CT evidence of acute intracranial abnormality.  High attenuation opacification of the left paranasal sinuses may reflect inspissated secretions, fungal colonization, or blood products. Correlate with symptoms and  dedicated sinus imaging if warranted.   Original Report Authenticated By: Jearld Lesch, M.D.    Dg Chest Port 1 View  10/19/2012  *RADIOLOGY REPORT*  Clinical Data: Shortness of breath.  PORTABLE CHEST - 1 VIEW  Comparison: Chest x-ray 10/18/2012.  Findings: Lung volumes are normal.  No consolidative airspace disease.  No pleural effusions.  No pneumothorax.  No pulmonary nodule or mass noted.  Pulmonary vasculature and the cardiomediastinal silhouette are within normal limits. Atherosclerosis in the thoracic aorta.  IMPRESSION: 1. No radiographic evidence of acute cardiopulmonary disease. 2.  Atherosclerosis.   Original Report Authenticated By: Trudie Reed, M.D.    Ct Maxillofacial Wo Cm  10/18/2012  *RADIOLOGY REPORT*  Clinical Data: Sinus congestion.  Evaluate left maxillary sinus. Abnormal CT head  CT MAXILLOFACIAL WITHOUT CONTRAST  Technique:  Multidetector CT imaging of the maxillofacial structures was performed. Multiplanar CT image reconstructions were also generated.  Comparison: CT head 10/17/2012  Findings: Left maxillary sinus is completely filled with high density material.  This is extending through the medial antral wall into the left nasal cavity.  This also extends into the left ethmoid sinus  and left frontal sinus where there is high density material.  There is destruction/remodeling of the medial left antral wall.  There is no intraorbital extension.  Mild mucosal edema right maxillary sinus.  No air-fluid levels are present.  Right ethmoid sphenoid and frontal sinuses are clear.  IMPRESSION: Left-sided opacification of the maxillary ethmoid and frontal sinuses with high density secretions/tumor.  There is remodeling/destruction of the medial wall of the left maxillary antrum.  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 Neoplasm is a possibility given the given the bony changes.  ENT consultation suggested.   MRI of the face without and  with contrast may be helpful to  evaluate for underlying neoplasm and invasive fungal sinusitis which is not identified on CT.   Original Report Authenticated By: Janeece Riggers, M.D.    Review of Systems: The patient denies anorexia, fever, weight loss, vision loss, decreased hearing, hoarseness, chest pain, syncope, dyspnea on exertion, peripheral edema, balance deficits, hemoptysis, abdominal pain, melena, hematochezia, severe indigestion/heartburn, hematuria, incontinence, genital sores, muscle weakness, suspicious skin lesions, transient blindness, difficulty walking, depression, unusual weight change, abnormal bleeding, enlarged lymph  nodes, angioedema, and breast masses.   Blood pressure 118/64, pulse 135, temperature 98.1 F (36.7 C), temperature source Oral, resp. rate 18, height 5\' 10"  (1.778 m), weight 86.8 kg (191 lb 5.8 oz), SpO2 100.00%.  Physical Exam  Constitutional: He appears well-developed and well-nourished.   Head: Normocephalic and atraumatic.  Mouth/Throat: No oropharyngeal exudate. No mass or lesion. Nose: Congested nasal mucosa. No purulent drainage No signs of head trauma, dry mucous membranes  Eyes: Conjunctivae and EOM are normal. Pupils are equal, round, and reactive to light.  No scleral icterus.  Neck: Normal range of motion. Neck supple. No JVD present. No thyromegaly present.  Musculoskeletal: Normal range of motion. He exhibits no edema and no tenderness.  Lymphadenopathy:  He has no cervical adenopathy.  Neurological: He is alert.  Skin: Skin is warm and dry. No rash noted. No erythema.  Psychiatric: He has a normal mood and affect. His behavior is normal.   Procedure:  Flexible Fiberoptic Nasal Endoscopy Anesthesia: Topical oxymetazoline and lidocaine Indication: Left sided sinonasal opacification Description: Risks, benefits, and alternatives of flexible endoscopy were explained to the patient.  Specific mention was made of the risk of throat numbness with difficulty swallowing, possible  bleeding from the nose and mouth, and pain from the procedure.  The patient gave oral consent to proceed.  The nasal cavities were decongested and anesthetised with a combination of oxymetazoline and 4% lidocaine solution.  The flexible scope was inserted into the right nasal cavity.  Endoscopy of the inferior and middle meatus was performed.  Congested mucosa was noted.  No polyp, mass, or lesion was appreciated.  Olfactory cleft was clear.  Nasopharynx was clear.  Turbinates were without mass.  The procedure was repeated on the left side. The left nasal cavity and the middle meatus were completely obstructed with polypoid tissue. No purulent drainage was noted. The patient tolerated the procedure well.  Instructions were given to avoid eating or drinking for 2 hours.    Assessment/Plan: Unilateral left maxillary, ethmoid, and frontal opacification. Polypoid tissue is noted within the left nasal cavity on nasal endoscopy today. The differential diagnosis includes allergic fungal sinusitis versus sinus neoplasm, such as inverting papilloma.  The patient will benefit from undergoing endoscopic sinus surgery to remove the nasal and sinus tissue once he is medically cleared.  The patient will followup in my office as an outpatient to discuss the surgical options. Followup info is given to the patient.  Albirda Shiel,SUI W 10/19/2012, 9:40 AM

## 2012-10-19 NOTE — Progress Notes (Signed)
[facility] PROGRESS NOTE  [name] [name]:[id number] DOB: [birth year] DOA: 10/17/2012 PCP: [name], MD  Assessment/Plan: Principal Problem:   Hyperosmolar non-ketotic state in patient with type 2 diabetes mellitus Active Problems:   Essential hypertension, benign   Cardiomyopathy   AKI (acute kidney injury)   Metabolic encephalopathy  Atrial fibrillation with a rapid ventricular response - Seen by cardiology - Started on Cardizem infusion - High-risk of cardioembolic phenomenon-would likely need long-term anticoagulation-start on heparin for now, would likely be able to transition to me where agents - Cycle enzymes and get a d-dimer as well - Move the step down unit to titrate Cardizem infusion - Appreciate cardiology consultation  Chest pain with history of Cardiomyopathy -Patient with centralized chest pain and SOB this morning after receiving hydralazine IV.   - Cycle enzyme - Appreciate [facility] cardiology consultation  H/o Cardiomyopathy Patient is said to have cardiomyopathy. 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 Clinically at this time, he has no evidence of CHF. 2D echocardiogram showed normal LV cavity size, severe LVH, Ef of 65% to 70% and grade 1 diastolic dysfunction. The LA was mildly dilated.  Type 2 diabetes mellitus/Hyperosmolar non-ketotic state:  Hgb A1c 13.7 Continue with Lantus and change Sliding Scale   Acute metabolic encephalopathy:  Resolved. Likely acute encephalopathy from metabolic cause  Dehydration/AKI:  Resolved. D/C IVF.  Essential hypertension, benign: Patient will be on a Cardizem infusion- therefore will stop ACE inhibitor- watch closely while on Cardizem and metoprolol  Code Status: Full Code. Family Communication:  Disposition Plan: inpatient.  Home when appropriate   Brief narrative: 62 y.o. male history of HTN, COPD/Asthma, tobacco abuse, cardiomyopathy, diverticulosis, possible prediabetic, with reported HBA1C 7.1 in  04/2012, presenting with sudden onset of confusion, per partner, which started in AM of 10/17/12, to the point where he was agitated and aggressive. Patient had been been mildly confused for the past 2 days, and had been complaining of generalized weakness. He has been having polyuria and polydipsia for about 2 months.    Consultants: [name] called 3/24  Procedures:  Head CT scan.   Antibiotics:  N/A.   HPI/Subjective: With CP and SOB this am.  Anxious.    Objective: Vital signs in last 24 hours: Temp:  [98.1 F (36.7 C)-98.2 F (36.8 C)] 98.1 F (36.7 C) (03/24 0530) Pulse Rate:  [65-137] 137 (03/24 0659) Resp:  [18-20] 18 (03/24 0530) BP: (122-182)/(61-90) 122/61 mmHg (03/24 0659) SpO2:  [92 %-97 %] 97 % (03/24 0659) Weight:  [86.3 kg (190 lb 4.1 oz)-86.8 kg (191 lb 5.8 oz)] 86.8 kg (191 lb 5.8 oz) (03/24 0530) Weight change: 2.611 kg (5 lb 12.1 oz) Last BM Date: 10/18/12  Intake/Output from previous day: 03/23 0701 - 03/24 0700 In: 2332.5 [P.O.:600; I.V.:1732.5] Out: -      Physical Exam: General: Anxious, alert, communicative, fully oriented HEENT:  No clinical pallor, no jaundice, no conjunctival injection or discharge. Hydration has improved. NECK:  Supple, JVP not seen, no carotid bruits, no palpable lymphadenopathy, no palpable goiter. CHEST:  Clinically clear to auscultation, no wheezes, no crackles. HEART:  Non tender to palpation.  Systolic murmur 2/6, tachycardia with irregular rhythm. ABDOMEN:  Full, soft, non-tender, no palpable organomegaly, no palpable masses, normal bowel sounds. GENITALIA:  Not examined. LOWER EXTREMITIES:  No pitting edema, palpable peripheral pulses. CENTRAL NERVOUS SYSTEM:  No focal neurologic deficit on gross examination.  Lab Results:  Recent Labs  10/17/12 1501 10/18/12 0645  WBC 11.8* 11.6*  HGB 16.1 13.9  HCT 45.4 40.3  PLT 263 233    Recent Labs  10/17/12 1005 10/17/12 1030 10/17/12 1501 10/18/12 0645   NA 136 138  --  141  K 3.7 3.7  --  3.3*  CL 93* 98  --  102  CO2 26  --   --  21  GLUCOSE 511* 505*  --  295*  BUN 20 22  --  16  CREATININE 1.26 1.30 1.04 1.07  CALCIUM 9.3  --   --  9.0    Studies/Results: Dg Chest 1 View  10/18/2012  *RADIOLOGY REPORT*  Clinical Data: Altered mental status  CHEST - 1 VIEW  Comparison: 05/12/2012  Findings: Cardiomediastinal silhouette is stable.  No acute infiltrate or pleural effusion.  No pulmonary edema.  Bony thorax is unremarkable.  IMPRESSION: No active disease.   Original Report Authenticated By: Natasha Mead, M.D.    Ct Head Wo Contrast  10/17/2012  *RADIOLOGY REPORT*  Clinical Data: Confusion  CT HEAD WITHOUT CONTRAST  Technique:  Contiguous axial images were obtained from the base of the skull through the vertex without contrast.  Comparison: None.  Findings: Periventricular and subcortical white matter hypodensities are nospecific, however, most in keeping with chronic microangiopathic change.  There is no evidence for acute hemorrhage, hydrocephalus, mass lesion, or abnormal extra-axial fluid collection.  No definite CT evidence for acute infarction. Opacified left maxillary sinus, anterior left ethmoid air cells, and left frontal sinus with high attenuation.  Mildly thickened posterior left maxillary sinus wall.  Medial wall appears diminutive/eroded.  Right paranasal sinuses and mastoid air cells are predominately clear.  IMPRESSION: White matter hypodensities, a nonspecific finding most often seen with chronic microangiopathic change.  No definite CT evidence of acute intracranial abnormality.  High attenuation opacification of the left paranasal sinuses may reflect inspissated secretions, fungal colonization, or blood products. Correlate with symptoms and dedicated sinus imaging if warranted.   Original Report Authenticated By: Jearld Lesch, M.D.    Ct Maxillofacial Wo Cm  10/18/2012  *RADIOLOGY REPORT*  Clinical Data: Sinus congestion.   Evaluate left maxillary sinus. Abnormal CT head  CT MAXILLOFACIAL WITHOUT CONTRAST  Technique:  Multidetector CT imaging of the maxillofacial structures was performed. Multiplanar CT image reconstructions were also generated.  Comparison: CT head 10/17/2012  Findings: Left maxillary sinus is completely filled with high density material.  This is extending through the medial antral wall into the left nasal cavity.  This also extends into the left ethmoid sinus  and left frontal sinus where there is high density material.  There is destruction/remodeling of the medial left antral wall.  There is no intraorbital extension.  Mild mucosal edema right maxillary sinus.  No air-fluid levels are present.  Right ethmoid sphenoid and frontal sinuses are clear.  IMPRESSION: Left-sided opacification of the maxillary ethmoid and frontal sinuses with high density secretions/tumor.  There is remodeling/destruction of the medial wall of the left maxillary antrum.  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 Neoplasm is a possibility given the given the bony changes.  ENT consultation suggested.   MRI of the face without and  with contrast may be helpful to evaluate for underlying neoplasm and invasive fungal sinusitis which is not identified on CT.   Original Report Authenticated By: Janeece Riggers, M.D.     Medications: Scheduled Meds: . aspirin EC  81 mg Oral Daily  . heparin  5,000 Units Subcutaneous Q8H  . insulin aspart  0-15 Units Subcutaneous TID WC  .  insulin detemir  15 Units Subcutaneous Daily  . losartan  100 mg Oral Daily  . metoprolol succinate  50 mg Oral Daily  . omega-3 acid ethyl esters  2 g Oral Daily  . pantoprazole  40 mg Oral Daily  . senna  1 tablet Oral BID  . sodium chloride  3 mL Intravenous Q12H   Continuous Infusions:   PRN Meds:.acetaminophen, acetaminophen, albuterol, dextrose, ondansetron (ZOFRAN) IV, ondansetron    LOS: 2 days   York, Marianne L, PA-C  [facility] Pager [phone]. If 8PM-8AM, please contact night-coverage at [email or web address], password [facility] 10/19/2012, 7:53 AM  LOS: 2 days   Attending Patient seen and examined, agree with the above assessment and plan, necessary edits have been made to the above  document. Moving to the step down, initiating Cardizem infusion. Appreciate cardiology input  S Lowana Hable

## 2012-10-19 NOTE — Progress Notes (Signed)
ANTICOAGULATION CONSULT NOTE - Initial Consult  Pharmacy Consult for Heparin Indication: atrial fibrillation  Allergies  Allergen Reactions  . Lisinopril Cough  . Penicillins     Whelps  . Tribenzor (Olmesartan-Amlodipine-Hctz) Nausea Only    dizziness    Patient Measurements: Height: 5\' 10"  (177.8 cm) Weight: 191 lb 5.8 oz (86.8 kg) IBW/kg (Calculated) : 73 Heparin Dosing Weight: 86.8kg  Vital Signs: Temp: 98.6 F (37 C) (03/24 1616) Temp src: Oral (03/24 1616) BP: 139/65 mmHg (03/24 1812) Pulse Rate: 85 (03/24 1812)  Labs:  Recent Labs  10/17/12 1501 10/18/12 0645 10/19/12 0930 10/19/12 0936 10/19/12 1750  HGB 16.1 13.9  --  14.3  --   HCT 45.4 40.3  --  41.8  --   PLT 263 233  --  211  --   APTT  --   --   --  28  --   LABPROT  --   --   --  12.2  --   INR  --   --   --  0.91  --   HEPARINUNFRC  --   --   --   --  0.80*  CREATININE 1.04 1.07 0.93 0.97  --   TROPONINI  --   --  <0.30  --   --     Estimated Creatinine Clearance: 80.5 ml/min (by C-G formula based on Cr of 0.97).  Assessment: 61yom continues on IV heparin for new-onset Afib. Heparin level is a bit high at 0.8. He did receive a dose of heparin SQ early this AM. No bleeding noted.  Of note, pts CT was negative for PE.   Goal of Therapy:  Heparin level 0.3-0.7 units/ml Monitor platelets by anticoagulation protocol: Yes   Plan:  1. Decrease heparin gtt to 1200 units/hr 2. Check an 8 hour heparin level  Lysle Pearl, PharmD, BCPS Pager # [phone] 10/19/2012 6:53 PM

## 2012-10-19 NOTE — Progress Notes (Signed)
ANTICOAGULATION CONSULT NOTE - Initial Consult  Pharmacy Consult for Heparin Indication: atrial fibrillation  Allergies  Allergen Reactions  . Lisinopril Cough  . Penicillins     Whelps  . Tribenzor (Olmesartan-Amlodipine-Hctz) Nausea Only    dizziness    Patient Measurements: Height: 5\' 10"  (177.8 cm) Weight: 191 lb 5.8 oz (86.8 kg) IBW/kg (Calculated) : 73 Heparin Dosing Weight: 86.8kg  Vital Signs: Temp: 98.1 F (36.7 C) (03/24 0530) Temp src: Oral (03/24 0530) BP: 118/64 mmHg (03/24 0745) Pulse Rate: 135 (03/24 0745)  Labs:  Recent Labs  10/17/12 1005 10/17/12 1030 10/17/12 1501 10/18/12 0645  HGB 14.7 15.6 16.1 13.9  HCT 42.4 46.0 45.4 40.3  PLT 241  --  263 233  CREATININE 1.26 1.30 1.04 1.07    Estimated Creatinine Clearance: 73 ml/min (by C-G formula based on Cr of 1.07).   Medical History: Past Medical History  Diagnosis Date  . GERD (gastroesophageal reflux disease)   . Allergy   . Diverticulitis   . History of colon polyps   . COPD (chronic obstructive pulmonary disease)   . Cardiomyopathy 2011    Patient was told that he had an ejection fraction of 37% by echo done elsewhere in 2011  . Hypertension   . Tobacco abuse   . Asthma     Medications:  Prescriptions prior to admission  Medication Sig Dispense Refill  . albuterol (PROVENTIL HFA;VENTOLIN HFA) 108 (90 BASE) MCG/ACT inhaler Inhale 2 puffs into the lungs every 6 (six) hours as needed for wheezing.      Marland Kitchen aspirin EC 81 MG tablet Take 81 mg by mouth daily.      . fish oil-omega-3 fatty acids 1000 MG capsule Take 2 g by mouth daily.       . hydrochlorothiazide (HYDRODIURIL) 25 MG tablet Take 25 mg by mouth daily.      Marland Kitchen losartan (COZAAR) 100 MG tablet Take 100 mg by mouth daily.      . metoprolol succinate (TOPROL-XL) 50 MG 24 hr tablet Take 50 mg by mouth daily. Take with or immediately following a meal.      . omeprazole (PRILOSEC) 20 MG capsule Take 20 mg by mouth daily.         Assessment: 61yom to start heparin for new-onset Afib. Patient reports no bleeding and is not taking any anticoagulants. Patient received SQ heparin 5000 units ~0600 - will give reduced heparin bolus.  - No baseline INR - will order - H/H and Plts wnl - Heparin weight: 86.8kg  Goal of Therapy:  Heparin level 0.3-0.7 units/ml Monitor platelets by anticoagulation protocol: Yes   Plan:  1. Heparin IV bolus 2000 units x 1 2. Heparin drip 1300 units/hr (13 ml/hr) 3. Baseline INR and PTT now 4. Heparin level 6 hours after heparin initiation 5. Daily heparin level and CBC  Cleon Dew, PharmD [phone] 10/19/2012,10:09 AM

## 2012-10-19 NOTE — Progress Notes (Addendum)
D-dimer elevated. Will proceed with CTA to r/o PE. K 2.9. Got KCl already, will give add'l PO x 2. Recheck labs in evening. 1st troponin neg. Dayna Dunn PA-C

## 2012-10-19 NOTE — Progress Notes (Signed)
Inpatient Diabetes Program Recommendations  AACE/ADA: New Consensus Statement on Inpatient Glycemic Control (2013)  Target Ranges:  Prepandial:   less than 140 mg/dL      Peak postprandial:   less than 180 mg/dL (1-2 hours)      Critically ill patients:  140 - 180 mg/dL  Results for [name] [name], [name] [name] (MRN [id number]) as of 10/19/2012 11:53  Ref. Range 10/18/2012 07:52 10/18/2012 11:35 10/18/2012 16:44 10/18/2012 21:24 10/19/2012 07:40  Glucose-Capillary Latest Range: 70-99 mg/dL 846 (H) 962 (H) 952 (H) 321 (H) 303 (H)    Inpatient Diabetes Program Recommendations Insulin - IV drip/GlucoStabilizer: Start insulin gtt until CBGs stable (140-180) Will follow concerning new-onset DM. Thank you  Piedad Climes BSN, RN,CDE Inpatient Diabetes Coordinator [phone] (team pager)

## 2012-10-20 ENCOUNTER — Ambulatory Visit: Payer: BC Managed Care – PPO | Admitting: Internal Medicine

## 2012-10-20 DIAGNOSIS — G9340 Encephalopathy, unspecified: Secondary | ICD-10-CM

## 2012-10-20 LAB — CBC
HCT: 35.1 % — ABNORMAL LOW (ref 39.0–52.0)
Hemoglobin: 12.1 g/dL — ABNORMAL LOW (ref 13.0–17.0)
RBC: 3.74 MIL/uL — ABNORMAL LOW (ref 4.22–5.81)
WBC: 8.1 10*3/uL (ref 4.0–10.5)

## 2012-10-20 LAB — COMPREHENSIVE METABOLIC PANEL
ALT: 14 U/L (ref 0–53)
Alkaline Phosphatase: 79 U/L (ref 39–117)
CO2: 21 mEq/L (ref 19–32)
Chloride: 104 mEq/L (ref 96–112)
GFR calc Af Amer: 84 mL/min — ABNORMAL LOW (ref >90)
GFR calc non Af Amer: 73 mL/min — ABNORMAL LOW (ref >90)
Glucose, Bld: 273 mg/dL — ABNORMAL HIGH (ref 70–99)
Potassium: 3.5 mEq/L (ref 3.5–5.1)
Sodium: 137 mEq/L (ref 135–145)
Total Bilirubin: 1 mg/dL (ref 0.3–1.2)
Total Protein: 5.9 g/dL — ABNORMAL LOW (ref 6.0–8.3)

## 2012-10-20 LAB — GLUCOSE, CAPILLARY: Glucose-Capillary: 278 mg/dL — ABNORMAL HIGH (ref 70–99)

## 2012-10-20 MED ORDER — INSULIN DETEMIR 100 UNIT/ML ~~LOC~~ SOLN
18.0000 [IU] | Freq: Every day | SUBCUTANEOUS | Status: DC
  Administered 2012-10-20 – 2012-10-21 (×2): 18 [IU] via SUBCUTANEOUS
  Filled 2012-10-20 (×2): qty 0.18

## 2012-10-20 MED ORDER — DILTIAZEM HCL ER COATED BEADS 120 MG PO CP24
120.0000 mg | ORAL_CAPSULE | Freq: Every day | ORAL | Status: DC
  Administered 2012-10-20 – 2012-10-21 (×2): 120 mg via ORAL
  Filled 2012-10-20 (×2): qty 1

## 2012-10-20 MED ORDER — METOPROLOL SUCCINATE ER 100 MG PO TB24
100.0000 mg | ORAL_TABLET | Freq: Every day | ORAL | Status: DC
  Administered 2012-10-21: 100 mg via ORAL
  Filled 2012-10-20: qty 1

## 2012-10-20 MED ORDER — LIVING WELL WITH DIABETES BOOK
Freq: Once | Status: AC
  Administered 2012-10-20: 15:00:00
  Filled 2012-10-20: qty 1

## 2012-10-20 MED ORDER — INSULIN ASPART 100 UNIT/ML ~~LOC~~ SOLN
4.0000 [IU] | Freq: Once | SUBCUTANEOUS | Status: AC
  Administered 2012-10-20: 4 [IU] via SUBCUTANEOUS

## 2012-10-20 NOTE — Progress Notes (Signed)
ANTICOAGULATION CONSULT NOTE   Pharmacy Consult for Heparin Indication: atrial fibrillation  Allergies  Allergen Reactions  . Lisinopril Cough  . Penicillins     Whelps  . Tribenzor (Olmesartan-Amlodipine-Hctz) Nausea Only    dizziness    Patient Measurements: Height: 5\' 10"  (177.8 cm) Weight: 194 lb 0.1 oz (88 kg) IBW/kg (Calculated) : 73 Heparin Dosing Weight: 86.8kg  Vital Signs: Temp: 98.3 F (36.8 C) (03/25 1114) Temp src: Oral (03/25 1114) BP: 110/95 mmHg (03/25 1114) Pulse Rate: 68 (03/25 1114)  Labs:  Recent Labs  10/18/12 0645 10/19/12 0930 10/19/12 0936 10/19/12 1750 10/19/12 2043 10/20/12 0230 10/20/12 1148  HGB 13.9  --  14.3  --   --  12.1*  --   HCT 40.3  --  41.8  --   --  35.1*  --   PLT 233  --  211  --   --  179  --   APTT  --   --  28  --   --   --   --   LABPROT  --   --  12.2  --   --   --   --   INR  --   --  0.91  --   --   --   --   HEPARINUNFRC  --   --   --  0.80*  --  0.94* 0.38  CREATININE 1.07 0.93 0.97  --  1.21 1.06  --   TROPONINI  --  <0.30  --   --   --   --   --     Estimated Creatinine Clearance: 79.7 ml/min (by C-G formula based on Cr of 1.06).  Assessment: 61yom continues on IV heparin for new-onset Afib. Heparin level is a at goal (0.38). No bleeding noted. CT was negative for PE. CBC trending down.  Plan to likely transition to warfarin or NOAC soon.  Goal of Therapy:  Heparin level 0.3-0.7 units/ml Monitor platelets by anticoagulation protocol: Yes   Plan:  1. Continue heparin gtt at 1000 units/hr 2. Daily HL/CBC 3. Follow plans for long term anticoagulation  Sheppard Coil PharmD., BCPS Clinical Pharmacist Pager [phone] 10/20/2012 2:35 PM

## 2012-10-20 NOTE — Plan of Care (Signed)
Problem: Food- and Nutrition-Related Knowledge Deficit (NB-1.1) Goal: Nutrition education Formal process to instruct or train a patient/client in a skill or to impart knowledge to help patients/clients voluntarily manage or modify food choices and eating behavior to maintain or improve health. Outcome: Completed/Met Date Met:  10/20/12 Pt with new-onset Type II DM. Lives with a roommate who also has DM. Spoke with pt about a diabetic diet. Utilized the teach-back method and provided him with [employer] Handout: Carbohydrate Counting for People with Diabetes. Pt was very interested and positive about the changes he will need to make concerning his diet. Pt asked appropriate questions and provided feedback about good meal choices.  Expect very good compliance. Informed pt to request to speak with an RD if questions arise in the future.  Please consult RD if further nutrition intervention is warranted.  [name] [name] Dietetic Intern # [phone]

## 2012-10-20 NOTE — Progress Notes (Signed)
PATIENT DETAILS Name: [name] [name] Age: 62 y.o. Sex: male Date of Birth: 1950-11-29 Admit Date: 10/17/2012 Admitting Physician Marinda Elk, MD WUJ:[id number] Yetta Barre, MD  Brief Summary Patient presented with 2 months of poydipsia/polyuria, culminating in an acute confusional state on 10/17/12, against a known background of HBA1C 7.1 in 04/2012. Initial labs revealed random blood glucose of 511, CO2 26, sodium 136, and patient appeared clinically dehydrated. He was managed with ivi insulin infusion, per Glucostablizer protocol, as well as with iv fluids, and as of this AM, CBGs are ranging between 173-300. He has been transitioned to [employer].HBA1C is now 13.7,. On 3/24 he was noted to have Afib with RVR and transferred to SDU for cardizem infusion.   Subjective: Back in sinus rhythm-no major issues   Assessment/Plan: Principal Problem:   Hyperosmolar non-ketotic state in patient with type 2 diabetes mellitus -resolved -off Insulin gtt-and now on Subcutaneous Lantus/SSI  Active Problems: Atrial fibrillation with a rapid ventricular response -back in sinus -will increase Toprol XL to 100 mg -start Oral cardizem -continue with Heparin infusion for now-will discharge on oral anticoagulant-likely Xarelto or Eliquis -appreciate cards input  Chest pain  -cardiac enzymes negative -further work up if needed while inpatient-at the discretion of cardiology  DM-newly diagnosed -increase Lantus to 18 units -c/w SSI -likely can add Metformin on discharge -Hgb A1c 13.7  Left Axillary Lymphadenopathy -?malignancy-no local inflammatory cause identified -currently will need cardiac issues and diabetic control further optimized-before we can begin work up and biopsy of these lymph nodes-will likely need to be done in the outpatient setting. Discussed with patient-he agrees to be done in the outpatient setting  Unilateral left maxillary, ethmoid, and frontal opacification -seen by ENT  on 3/24-Polypoid tissue is noted within the left nasal cavity on nasal endoscopy. Per ENT the diagnosis includes allergic fungal sinusitis versus sinus neoplasm, such as inverting papilloma. Patient will need endoscopic sinus surgery to remove the nasal and sinus tissue .ENT planning to do this as outpatient, will need to provide patient-Dr Teoh's contact information on discharge  H/o Cardiomyopathy Clinically at this time, he has no evidence of CHF. 2D echocardiogram showed normal LV cavity size, severe LVH, Ef of 65% to 70% and grade 1 diastolic dysfunction. The LA was mildly dilated.  Acute metabolic encephalopathy:  Resolved.  Likely acute encephalopathy from metabolic cause  Dehydration/AKI:  Resolved. D/C IVF.   Essential hypertension, benign: -increasing Metoprolol and cardizem dosing. Monitor and re-adjust in next 24 hours  Disposition: Remain inpatient  DVT Prophylaxis: Not needed as on Heparin gtt  Code Status: Full code   Procedures:  None  CONSULTS:  cardiology  PHYSICAL EXAM: Vital signs in last 24 hours: Filed Vitals:   10/20/12 0434 10/20/12 0804 10/20/12 1114 10/20/12 1619  BP:  151/49 110/95   Pulse:  69 68   Temp:  98.4 F (36.9 C) 98.3 F (36.8 C) 98.4 F (36.9 C)  TempSrc:  Oral Oral Oral  Resp:  23 27   Height:      Weight: 88 kg (194 lb 0.1 oz)     SpO2:  96% 95%     Weight change: 1.7 kg (3 lb 12 oz) Body mass index is 27.84 kg/(m^2).   Gen Exam: Awake and alert with clear speech.   Neck: Supple, No JVD.   Chest: B/L Clear.   CVS: S1 S2 Regular, no murmurs.  Abdomen: soft, BS +, non tender, non distended.  Extremities: no edema, lower extremities warm to touch. Neurologic:  Non Focal.   Skin: No Rash.   Wounds: N/A.    Intake/Output from previous day:  Intake/Output Summary (Last 24 hours) at 10/20/12 1637 Last data filed at 10/20/12 1300  Gross per 24 hour  Intake 1026.02 ml  Output   1050 ml  Net -23.98 ml     LAB  RESULTS: CBC  Recent Labs Lab 10/17/12 1005 10/17/12 1030 10/17/12 1501 10/18/12 0645 10/19/12 0936 10/20/12 0230  WBC 7.8  --  11.8* 11.6* 9.3 8.1  HGB 14.7 15.6 16.1 13.9 14.3 12.1*  HCT 42.4 46.0 45.4 40.3 41.8 35.1*  PLT 241  --  263 233 211 179  MCV 90.8  --  92.8 93.1 93.7 93.9  MCH 31.5  --  32.9 32.1 32.1 32.4  MCHC 34.7  --  35.5 34.5 34.2 34.5  RDW 12.6  --  12.7 12.7 12.9 13.1  LYMPHSABS 1.2  --   --   --   --   --   MONOABS 0.3  --   --   --   --   --   EOSABS 0.1  --   --   --   --   --   BASOSABS 0.0  --   --   --   --   --     Chemistries   Recent Labs Lab 10/18/12 0645 10/19/12 0930 10/19/12 0936 10/19/12 2043 10/20/12 0230  NA 141 136 136 136 137  K 3.3* 2.9* 2.9* 3.8 3.5  CL 102 98 96 103 104  CO2 21 18* 18* 22 21  GLUCOSE 295* 328* 328* 303* 273*  BUN 16 14 14 17 15   CREATININE 1.07 0.93 0.97 1.21 1.06  CALCIUM 9.0 9.0 9.0 8.6 8.5  MG  --  1.8  --   --   --     CBG:  Recent Labs Lab 10/19/12 1246 10/19/12 1721 10/19/12 2135 10/20/12 0806 10/20/12 1111  GLUCAP 284* 341* 271* 270* 281*    GFR Estimated Creatinine Clearance: 79.7 ml/min (by C-G formula based on Cr of 1.06).  Coagulation profile  Recent Labs Lab 10/19/12 0936  INR 0.91    Cardiac Enzymes  Recent Labs Lab 10/19/12 0930  TROPONINI <0.30    No components found with this basename: POCBNP,   Recent Labs  10/19/12 0930  DDIMER 3.31*   No results found for this basename: HGBA1C,  in the last 72 hours  Recent Labs  10/19/12 0936  CHOL 223*  HDL 38*  LDLCALC 133*  TRIG 258*  CHOLHDL 5.9    Recent Labs  10/19/12 0936  TSH 1.412   No results found for this basename: VITAMINB12, FOLATE, FERRITIN, TIBC, IRON, RETICCTPCT,  in the last 72 hours No results found for this basename: LIPASE, AMYLASE,  in the last 72 hours  Urine Studies No results found for this basename: UACOL, UAPR, USPG, UPH, UTP, UGL, UKET, UBIL, UHGB, UNIT, UROB, ULEU, UEPI,  UWBC, URBC, UBAC, CAST, CRYS, UCOM, BILUA,  in the last 72 hours  MICROBIOLOGY: No results found for this or any previous visit (from the past 240 hour(s)).  RADIOLOGY STUDIES/RESULTS: Dg Chest 1 View  10/18/2012  *RADIOLOGY REPORT*  Clinical Data: Altered mental status  CHEST - 1 VIEW  Comparison: 05/12/2012  Findings: Cardiomediastinal silhouette is stable.  No acute infiltrate or pleural effusion.  No pulmonary edema.  Bony thorax is unremarkable.  IMPRESSION: No active disease.   Original Report Authenticated By: Natasha Mead, M.D.  Ct Head Wo Contrast  10/17/2012  *RADIOLOGY REPORT*  Clinical Data: Confusion  CT HEAD WITHOUT CONTRAST  Technique:  Contiguous axial images were obtained from the base of the skull through the vertex without contrast.  Comparison: None.  Findings: Periventricular and subcortical white matter hypodensities are nospecific, however, most in keeping with chronic microangiopathic change.  There is no evidence for acute hemorrhage, hydrocephalus, mass lesion, or abnormal extra-axial fluid collection.  No definite CT evidence for acute infarction. Opacified left maxillary sinus, anterior left ethmoid air cells, and left frontal sinus with high attenuation.  Mildly thickened posterior left maxillary sinus wall.  Medial wall appears diminutive/eroded.  Right paranasal sinuses and mastoid air cells are predominately clear.  IMPRESSION: White matter hypodensities, a nonspecific finding most often seen with chronic microangiopathic change.  No definite CT evidence of acute intracranial abnormality.  High attenuation opacification of the left paranasal sinuses may reflect inspissated secretions, fungal colonization, or blood products. Correlate with symptoms and dedicated sinus imaging if warranted.   Original Report Authenticated By: Jearld Lesch, M.D.    Ct Angio Chest Pe W/cm &/or Wo Cm  10/19/2012  *RADIOLOGY REPORT*  Clinical Data: Chest pain.  CT ANGIOGRAPHY CHEST   Technique:  Multidetector CT imaging of the chest using the standard protocol during bolus administration of intravenous contrast. Multiplanar reconstructed images including MIPs were obtained and reviewed to evaluate the vascular anatomy.  Contrast: OMNIPAQUE IOHEXOL 350 MG/ML SOLN  Comparison: None.  Findings: No filling defects in the pulmonary arterial tree to suggest acute pulmonary thromboembolism.  No abnormal mediastinal adenopathy.  Dense calcifications of the left main, circumflex, and left anterior descending coronary artery.  Moderate calcifications in the right coronary artery. Atherosclerotic calcifications of the aortic arch.  No pericardial effusion.  No pneumothorax and no pleural effusion.  Subsegmental atelectasis at the left lung base.  Otherwise clear lungs.  Abnormal left axillary adenopathy is present.  Largest node has a short axis diameter of 1.7 cm.  12 mm short axis diameter left axillary node on image three.  No destructive bone lesion.  IMPRESSION: No evidence of acute pulmonary thromboembolism.  Abnormal left axillary adenopathy.  Considerations include both inflammatory and neoplastic etiology.  Correlate clinically as for the need for further imaging or biopsy.   Original Report Authenticated By: Jolaine Click, M.D.    Dg Chest Port 1 View  10/19/2012  *RADIOLOGY REPORT*  Clinical Data: Shortness of breath.  PORTABLE CHEST - 1 VIEW  Comparison: Chest x-ray 10/18/2012.  Findings: Lung volumes are normal.  No consolidative airspace disease.  No pleural effusions.  No pneumothorax.  No pulmonary nodule or mass noted.  Pulmonary vasculature and the cardiomediastinal silhouette are within normal limits. Atherosclerosis in the thoracic aorta.  IMPRESSION: 1. No radiographic evidence of acute cardiopulmonary disease. 2.  Atherosclerosis.   Original Report Authenticated By: Trudie Reed, M.D.    Ct Maxillofacial Wo Cm  10/18/2012  *RADIOLOGY REPORT*  Clinical Data: Sinus  congestion.  Evaluate left maxillary sinus. Abnormal CT head  CT MAXILLOFACIAL WITHOUT CONTRAST  Technique:  Multidetector CT imaging of the maxillofacial structures was performed. Multiplanar CT image reconstructions were also generated.  Comparison: CT head 10/17/2012  Findings: Left maxillary sinus is completely filled with high density material.  This is extending through the medial antral wall into the left nasal cavity.  This also extends into the left ethmoid sinus  and left frontal sinus where there is high density material.  There is destruction/remodeling of  the medial left antral wall.  There is no intraorbital extension.  Mild mucosal edema right maxillary sinus.  No air-fluid levels are present.  Right ethmoid sphenoid and frontal sinuses are clear.  IMPRESSION: Left-sided opacification of the maxillary ethmoid and frontal sinuses with high density secretions/tumor.  There is remodeling/destruction of the medial wall of the left maxillary antrum.  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 Neoplasm is a possibility given the given the bony changes.  ENT consultation suggested.   MRI of the face without and  with contrast may be helpful to evaluate for underlying neoplasm and invasive fungal sinusitis which is not identified on CT.   Original Report Authenticated By: Janeece Riggers, M.D.     MEDICATIONS: Scheduled Meds: . aspirin  81 mg Oral Daily  . diltiazem  120 mg Oral Daily  . feeding supplement  237 mL Oral BID BM  . insulin aspart  0-15 Units Subcutaneous TID WC  . insulin detemir  18 Units Subcutaneous Daily  . living well with diabetes book   Does not apply Once  . [START ON 10/21/2012] metoprolol succinate  100 mg Oral Daily  . omega-3 acid ethyl esters  2 g Oral Daily  . pantoprazole  40 mg Oral Daily  . senna  1 tablet Oral BID  . sodium chloride  3 mL Intravenous Q12H   Continuous Infusions: . heparin 1,000 Units/hr (10/20/12 0400)   PRN Meds:.acetaminophen,  acetaminophen, albuterol, dextrose, diphenhydrAMINE, morphine injection, nitroGLYCERIN, ondansetron (ZOFRAN) IV, ondansetron, sodium chloride  Antibiotics: Anti-infectives   None       Jeoffrey Massed, MD  [facility] Pager:[phone]  If 7PM-7AM, please contact night-coverage [email or web address] Password [facility] 10/20/2012, 4:37 PM   LOS: 3 days

## 2012-10-20 NOTE — Progress Notes (Signed)
Patient ID: [name] [name], male   DOB: March 29, 1951, 62 y.o.   MRN: [id number] Subjective:  Dyspnea much improved, denies chest pain.  Objective:  Vital Signs in the last 24 hours: Temp:  [98.4 F (36.9 C)-99.1 F (37.3 C)] 98.4 F (36.9 C) (03/25 0804) Pulse Rate:  [57-122] 57 (03/25 0400) Resp:  [20-24] 21 (03/25 0400) BP: (92-139)/(47-69) 135/69 mmHg (03/25 0400) SpO2:  [92 %-97 %] 92 % (03/25 0400) Weight:  [194 lb 0.1 oz (88 kg)] 194 lb 0.1 oz (88 kg) (03/25 0434)  Intake/Output from previous day: 03/24 0701 - 03/25 0700 In: 321 [I.V.:321] Out: 725 [Urine:725] Intake/Output from this shift: Total I/O In: -  Out: 125 [Urine:125]  Physical Exam: Well appearing middle-aged man,NAD HEENT: Unremarkable Neck:  7 cm JVD, no thyromegally Lungs:  Clear with no wheezes, rales, or rhonchi. HEART:  Regular rate rhythm, no murmurs, no rubs, no clicks Abd:  Flat, positive bowel sounds, no organomegally, no rebound, no guarding Ext:  2 plus pulses, no edema, no cyanosis, no clubbing Skin:  No rashes no nodules Neuro:  CN II through XII intact, motor grossly intact  Lab Results:  Recent Labs  10/19/12 0936 10/20/12 0230  WBC 9.3 8.1  HGB 14.3 12.1*  PLT 211 179    Recent Labs  10/19/12 2043 10/20/12 0230  NA 136 137  K 3.8 3.5  CL 103 104  CO2 22 21  GLUCOSE 303* 273*  BUN 17 15  CREATININE 1.21 1.06    Recent Labs  10/19/12 0930  TROPONINI <0.30   Hepatic Function Panel  Recent Labs  10/20/12 0230  PROT 5.9*  ALBUMIN 2.8*  AST 88*  ALT 14  ALKPHOS 79  BILITOT 1.0    Recent Labs  10/19/12 0936  CHOL 223*   No results found for this basename: PROTIME,  in the last 72 hours  Imaging: Ct Angio Chest Pe W/cm &/or Wo Cm  10/19/2012  *RADIOLOGY REPORT*  Clinical Data: Chest pain.  CT ANGIOGRAPHY CHEST  Technique:  Multidetector CT imaging of the chest using the standard protocol during bolus administration of intravenous contrast. Multiplanar  reconstructed images including MIPs were obtained and reviewed to evaluate the vascular anatomy.  Contrast: OMNIPAQUE IOHEXOL 350 MG/ML SOLN  Comparison: None.  Findings: No filling defects in the pulmonary arterial tree to suggest acute pulmonary thromboembolism.  No abnormal mediastinal adenopathy.  Dense calcifications of the left main, circumflex, and left anterior descending coronary artery.  Moderate calcifications in the right coronary artery. Atherosclerotic calcifications of the aortic arch.  No pericardial effusion.  No pneumothorax and no pleural effusion.  Subsegmental atelectasis at the left lung base.  Otherwise clear lungs.  Abnormal left axillary adenopathy is present.  Largest node has a short axis diameter of 1.7 cm.  12 mm short axis diameter left axillary node on image three.  No destructive bone lesion.  IMPRESSION: No evidence of acute pulmonary thromboembolism.  Abnormal left axillary adenopathy.  Considerations include both inflammatory and neoplastic etiology.  Correlate clinically as for the need for further imaging or biopsy.   Original Report Authenticated By: Jolaine Click, M.D.    Dg Chest Port 1 View  10/19/2012  *RADIOLOGY REPORT*  Clinical Data: Shortness of breath.  PORTABLE CHEST - 1 VIEW  Comparison: Chest x-ray 10/18/2012.  Findings: Lung volumes are normal.  No consolidative airspace disease.  No pleural effusions.  No pneumothorax.  No pulmonary nodule or mass noted.  Pulmonary vasculature and the cardiomediastinal  silhouette are within normal limits. Atherosclerosis in the thoracic aorta.  IMPRESSION: 1. No radiographic evidence of acute cardiopulmonary disease. 2.  Atherosclerosis.   Original Report Authenticated By: Trudie Reed, M.D.     Cardiac Studies: Telemetry - normal sinus rhythm Assessment/Plan:  1. Atrial fibrillation - he has reverted back to sinus rhythm. He will need long-term anticoagulation with Coumadin or any novel agent. I would suggest one  of the novel agents. Initially, a calcium channel blocker or beta blocker for rate control would be most reasonable. 2. Hypertension - his blood pressure is now well-controlled. Would transition from intravenous Cardizem to oral Cardizem 180 mg to 240 mg daily.  LOS: 3 days    Gregg Taylor,M.D. 10/20/2012, 10:32 AM

## 2012-10-20 NOTE — Plan of Care (Signed)
Problem: Consults Goal: Diagnosis-Diabetes Mellitus Outcome: Completed/Met Date Met:  10/20/12 New Onset Type II

## 2012-10-20 NOTE — Plan of Care (Signed)
Problem: Consults Goal: Diagnosis-Diabetes Mellitus New Onset Type II      

## 2012-10-20 NOTE — Progress Notes (Signed)
ANTICOAGULATION CONSULT NOTE  Pharmacy Consult for Heparin Indication: atrial fibrillation  Allergies  Allergen Reactions  . Lisinopril Cough  . Penicillins     Whelps  . Tribenzor (Olmesartan-Amlodipine-Hctz) Nausea Only    dizziness    Patient Measurements: Height: 5\' 10"  (177.8 cm) Weight: 191 lb 5.8 oz (86.8 kg) IBW/kg (Calculated) : 73  Vital Signs: Temp: 99.1 F (37.3 C) (03/25 0000) Temp src: Oral (03/25 0000) BP: 130/66 mmHg (03/25 0000) Pulse Rate: 65 (03/25 0000)  Labs:  Recent Labs  10/18/12 0645 10/19/12 0930 10/19/12 0936 10/19/12 1750 10/19/12 2043 10/20/12 0230  HGB 13.9  --  14.3  --   --  12.1*  HCT 40.3  --  41.8  --   --  35.1*  PLT 233  --  211  --   --  179  APTT  --   --  28  --   --   --   LABPROT  --   --  12.2  --   --   --   INR  --   --  0.91  --   --   --   HEPARINUNFRC  --   --   --  0.80*  --  0.94*  CREATININE 1.07 0.93 0.97  --  1.21  --   TROPONINI  --  <0.30  --   --   --   --     Estimated Creatinine Clearance: 64.5 ml/min (by C-G formula based on Cr of 1.21).  Assessment: 62 yo male with Afib for Heparin.   Goal of Therapy:  Heparin level 0.3-0.7 units/ml Monitor platelets by anticoagulation protocol: Yes   Plan:  Hold heparin x 30 minutes, then decrease 1000 units/hr Check heparin level in 8 hours.  Geannie Risen, PharmD, BCPS  10/20/2012 3:25 AM

## 2012-10-20 NOTE — Progress Notes (Signed)
Pt transferred to 2006 per MD order. Report called to receiving nurse and all questions answered.

## 2012-10-21 LAB — HEPARIN LEVEL (UNFRACTIONATED): Heparin Unfractionated: 0.25 IU/mL — ABNORMAL LOW (ref 0.30–0.70)

## 2012-10-21 LAB — CBC
MCHC: 33.7 g/dL (ref 30.0–36.0)
Platelets: 172 10*3/uL (ref 150–400)
RDW: 12.7 % (ref 11.5–15.5)

## 2012-10-21 MED ORDER — INSULIN DETEMIR 100 UNIT/ML ~~LOC~~ SOLN: 20.0000 [IU] | Freq: Every day | SUBCUTANEOUS | Status: DC

## 2012-10-21 MED ORDER — "BD GETTING STARTED TAKE HOME KIT: 1ML X 30 G SYRINGES, "
1.0000 | Freq: Once | Status: AC
  Administered 2012-10-21: 1
  Filled 2012-10-21: qty 1

## 2012-10-21 MED ORDER — RIVAROXABAN 20 MG PO TABS
20.0000 mg | ORAL_TABLET | Freq: Once | ORAL | Status: AC
  Administered 2012-10-21: 20 mg via ORAL
  Filled 2012-10-21: qty 1

## 2012-10-21 MED ORDER — HYDROCHLOROTHIAZIDE 25 MG PO TABS: 12.5000 mg | ORAL_TABLET | Freq: Every day | ORAL | Status: DC

## 2012-10-21 MED ORDER — METFORMIN HCL 500 MG PO TABS: 500.0000 mg | ORAL_TABLET | Freq: Two times a day (BID) | ORAL | Status: DC

## 2012-10-21 MED ORDER — INSULIN ASPART 100 UNIT/ML ~~LOC~~ SOLN: 0.0000 [IU] | Freq: Three times a day (TID) | SUBCUTANEOUS | Status: DC

## 2012-10-21 MED ORDER — RIVAROXABAN 20 MG PO TABS: 20.0000 mg | ORAL_TABLET | Freq: Every day | ORAL | Status: DC

## 2012-10-21 MED ORDER — DILTIAZEM HCL ER COATED BEADS 120 MG PO CP24: 120.0000 mg | ORAL_CAPSULE | Freq: Every day | ORAL | Status: DC

## 2012-10-21 MED ORDER — RIVAROXABAN 20 MG PO TABS
20.0000 mg | ORAL_TABLET | Freq: Every day | ORAL | Status: DC
  Filled 2012-10-21: qty 1

## 2012-10-21 NOTE — Progress Notes (Signed)
ANTICOAGULATION CONSULT NOTE - Follow Up Consult  Pharmacy Consult for Heparin Indication: atrial fibrillation  Allergies  Allergen Reactions  . Lisinopril Cough  . Penicillins     Whelps  . Tribenzor (Olmesartan-Amlodipine-Hctz) Nausea Only    dizziness    Patient Measurements: Height: 5\' 10"  (177.8 cm) Weight: 196 lb 3.4 oz (89 kg) IBW/kg (Calculated) : 73 Heparin Dosing Weight: 87 kg  Vital Signs: Temp: 98.2 F (36.8 C) (03/26 0434) Temp src: Oral (03/26 0434) BP: 147/81 mmHg (03/26 0434) Pulse Rate: 61 (03/26 0434)  Labs:  Recent Labs  10/19/12 0930  10/19/12 0936  10/19/12 2043 10/20/12 0230 10/20/12 1148 10/21/12 0550  HGB  --   < > 14.3  --   --  12.1*  --  12.3*  HCT  --   --  41.8  --   --  35.1*  --  36.5*  PLT  --   --  211  --   --  179  --  172  APTT  --   --  28  --   --   --   --   --   LABPROT  --   --  12.2  --   --   --   --   --   INR  --   --  0.91  --   --   --   --   --   HEPARINUNFRC  --   --   --   < >  --  0.94* 0.38 0.25*  CREATININE 0.93  --  0.97  --  1.21 1.06  --   --   TROPONINI <0.30  --   --   --   --   --   --   --   < > = values in this interval not displayed.  Estimated Creatinine Clearance: 80.1 ml/min (by C-G formula based on Cr of 1.06).  Assessment:  New onset afib this admit. Heparin level now just below goal on 1000 units/hr. CBC about the same.  Goal of Therapy:  Heparin level 0.3-0.7 units/ml Monitor platelets by anticoagulation protocol: Yes   Plan:   Increase heparin drip to 1150 units/hr.  Heparin level ~6-8 hrs after increase, if not transitioned to oral agent today.  Daily heparin level and CBC while on Heparin.  Dennie Fetters, [place] Pager: [phone] 10/21/2012,9:07 AM

## 2012-10-21 NOTE — Progress Notes (Signed)
Occupational Therapy Discharge Patient Details Name: [name] [name] MRN: [id number] DOB: 10-24-1950 Today's Date: 10/21/2012 Time:  -     Patient discharged from OT services secondary to Independent at this time (at baseline) per PT Darl Pikes note.  Please see latest therapy progress note for current level of functioning and progress toward goals.      GO     Harrel Carina [facility] 10/21/2012, 7:05 AM

## 2012-10-21 NOTE — Progress Notes (Signed)
Inpatient Diabetes Program Recommendations  AACE/ADA: New Consensus Statement on Inpatient Glycemic Control (2013)  Target Ranges:  Prepandial:   less than 140 mg/dL      Peak postprandial:   less than 180 mg/dL (1-2 hours)      Critically ill patients:  140 - 180 mg/dL   Diabetes Coordinator met with patient and friend to discuss A1C=13.7 and discharge plans.  The patient states that he has given himself insulin and is comfortable drawing it up and administering it to himself.  He states he received a RX for a meter, strips and lancets. He does not desire OP education at this time.  Discussed CBGs still 200-300 range and he will need close monitoring of CBGs at home and follow up with PCP if CBGs do not start to trend down in a few days.  Patient agrees. No further questions/concerns at this time. Thank you  [name] [name] BSN, RN,CDE Inpatient Diabetes Coordinator [phone] (team pager)

## 2012-10-21 NOTE — Discharge Summary (Signed)
Physician Discharge Summary  [name] [name] ZOX:[id number] DOB: 1950/11/25 DOA: 10/17/2012  PCP: [name], MD  Admit date: 10/17/2012 Discharge date: 10/21/2012  Time spent: 55 minutes  Recommendations for Outpatient Follow-up:  1. Follow up ENT for polypoid tissue in the left nasal cavity 2. 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 male  With past medical history of hypertension, as per partner who is at bedside was told he was a prediabetic and her last hemoglobin A1c on October 2013 that was 7.1. The patient is significantly confused and cannot give a history. Comes in for sudden onset of confusion. As per partner started this morning to the point where he is agitated and aggressive. He relates he's been mildly confused for the past 2 days. But he did not pay that much attention. The patient cannot answer questions and cannot follow commands. As per partner he has no asymmetrical weakness. The patient has just been complaining of generalized weakness. He has been having polyuria and polydipsia for about 2 months. Has not been following up with his primary care Dr. He denies he's had any fever chills, dysuria, cuts or sores, no cough, or shortness of breath.      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 Will also start Metformin 500 mg po BID  Atrial fibrillation with a rapid ventricular response  -back in sinus  Continue on Toprol xl 100 mg po daily and cardizem 120 mg po daily Will start anticoagulation with xarelto 20 mg po daily  Chest pain  -cardiac enzymes negative  Cardiology followed , no further work up at this time.    Left Axillary Lymphadenopathy  -?malignancy-no local inflammatory cause identified  Will need  biopsy of these lymph nodes-will likely need to be done in the outpatient setting. Discussed with patient-he agrees to be done in the outpatient setting   Unilateral left maxillary, ethmoid, and frontal opacification  -seen by ENT on 3/24-Polypoid tissue is noted within the left nasal cavity on nasal endoscopy. Per ENT the diagnosis includes allergic fungal sinusitis versus sinus neoplasm, such as inverting papilloma. Patient will need endoscopic sinus surgery to remove the nasal and sinus tissue .ENT planning to do this as outpatient,  H/o Cardiomyopathy  Clinically at this time, he has no evidence of CHF. 2D echocardiogram showed normal LV cavity size, severe LVH, Ef of 65% to 70% and grade 1 diastolic dysfunction. The LA was mildly dilated. Will restart hCTZ at 12.5 mg po daily  Acute metabolic encephalopathy:  Resolved.  Likely acute encephalopathy from metabolic cause   Dehydration/AKI:  Resolved.   Essential hypertension, benign:  Continue on Toprol xl 100 mg po daily and cardizem 120 mg po daily Also restart HCTZ  12.5 mg po daily  Procedures:  None  Consultations:  Cardiology  ENT  Discharge Exam: Filed Vitals:   10/20/12 1842 10/20/12 1938 10/21/12 0434 10/21/12 0935  BP: 156/92 160/80 147/81 166/83  Pulse: 66 61 61 65  Temp: 98.4 F (36.9 C) 98.3 F (36.8 C) 98.2 F (36.8 C)   TempSrc: Oral Oral Oral   Resp: 18 18 17 18   Height:      Weight:   89  kg (196 lb 3.4 oz)   SpO2: 98% 99% 97% 98%    General: Appear in no acute distress Cardiovascular: s1s2 RRR Respiratory: Clear bilaterally Ext : No edema  Discharge Instructions  Discharge Orders   Future Orders Complete By Expires     Diet - low sodium heart healthy  As directed     Increase activity slowly  As directed         Medication List    STOP taking these medications       aspirin EC 81 MG tablet     losartan 100 MG tablet  Commonly known as:  COZAAR      TAKE these medications       albuterol 108 (90 BASE) MCG/ACT inhaler  Commonly known as:  PROVENTIL HFA;VENTOLIN HFA  Inhale 2 puffs into the lungs every 6 (six) hours as needed for wheezing.     diltiazem 120 MG 24 hr capsule  Commonly known as:  CARDIZEM CD  Take 1 capsule (120 mg total) by mouth daily.     fish oil-omega-3 fatty acids 1000 MG capsule  Take 2 g by mouth daily.     hydrochlorothiazide 25 MG tablet  Commonly known as:  HYDRODIURIL  Take 0.5 tablets (12.5 mg total) by mouth daily.     insulin aspart 100 UNIT/ML injection  Commonly known as:  novoLOG  Inject 0-15 Units into the skin 3 (three) times daily with meals.     insulin detemir 100 UNIT/ML injection  Commonly known as:  LEVEMIR  Inject 0.2 mLs (20 Units total) into the skin daily.     metFORMIN 500 MG tablet  Commonly known as:  GLUCOPHAGE  Take 1 tablet (500 mg total) by mouth 2 (two) times daily with a meal.     metoprolol succinate 50 MG 24 hr tablet  Commonly known as:  TOPROL-XL  Take 50 mg by mouth daily. Take with or immediately following a meal.     omeprazole 20 MG capsule  Commonly known as:  PRILOSEC  Take 20 mg by mouth daily.     Rivaroxaban 20 MG Tabs  Commonly known as:  XARELTO  Take 1 tablet (20 mg total) by mouth daily.           Follow-up Information   Follow up with Darletta Moll, MD In 1 week.   Contact information:   [address] [place] [address] [phone]        Follow up with [name], MD In 2 weeks.   Contact information:   [address] [place] [address] [phone]        The results of significant diagnostics from this hospitalization (including imaging, microbiology, ancillary and laboratory) are listed below for reference.    Significant Diagnostic Studies: Dg Chest 1 View  10/18/2012  *RADIOLOGY REPORT*  Clinical Data: Altered mental status  CHEST - 1 VIEW  Comparison: 05/12/2012  Findings: Cardiomediastinal silhouette is stable.  No acute  infiltrate or pleural effusion.  No pulmonary edema.  Bony thorax is unremarkable.  IMPRESSION: No active disease.   Original Report Authenticated By: Natasha Mead, M.D.    Ct Head Wo Contrast  10/17/2012  *RADIOLOGY REPORT*  Clinical Data: Confusion  CT HEAD WITHOUT CONTRAST  Technique:  Contiguous axial images were obtained from the base of the skull through the vertex without contrast.  Comparison: None.  Findings: Periventricular and subcortical white matter hypodensities are nospecific, however, most in keeping with chronic microangiopathic change.  There is no evidence for acute hemorrhage, hydrocephalus, mass lesion, or abnormal extra-axial fluid collection.  No definite CT evidence for acute infarction. Opacified left maxillary sinus, anterior left ethmoid air cells, and left frontal sinus with high attenuation.  Mildly thickened posterior left maxillary sinus wall.  Medial wall appears diminutive/eroded.  Right paranasal sinuses and mastoid air cells are predominately clear.  IMPRESSION: White matter hypodensities, a nonspecific finding most often seen with chronic microangiopathic change.  No definite CT evidence of acute intracranial abnormality.  High attenuation opacification of the left paranasal sinuses may reflect inspissated secretions, fungal colonization, or blood products. Correlate with symptoms and dedicated sinus imaging if warranted.   Original Report  Authenticated By: Jearld Lesch, M.D.    Ct Angio Chest Pe W/cm &/or Wo Cm  10/19/2012  *RADIOLOGY REPORT*  Clinical Data: Chest pain.  CT ANGIOGRAPHY CHEST  Technique:  Multidetector CT imaging of the c  IMPRESSION: No evidence of acute pulmonary thromboembolism.  Abnormal left axillary adenopathy.  Considerations include both inflammatory and neoplastic etiology.  Correlate clinically as for the need for further imaging or biopsy.   Original Report Authenticated By: Jolaine Click, M.D.    Dg Chest Port 1 View  10/19/2012  *RADIOLOGY REPORT*  Clinical Data: Shortness of breath.  PORTABLE CHEST - 1 VIEW  Comparison: Chest x-ray 10/18/2012.   IMPRESSION: 1. No radiographic evidence of acute cardiopulmonary disease. 2.  Atherosclerosis.   Original Report Authenticated By: Trudie Reed, M.D.    Ct Maxillofacial Wo Cm  10/18/2012  *RADIOLOGY REPORT*  Clinical Data: Sinus congestion.  Evaluate left maxillary sinus. Abnormal CT head  CT MAXILLOFACIAL WITHOUT CONTRAST    IMPRESSION: Left-sided opacification of the maxillary ethmoid and frontal sinuses with high density secretions/tumor.  There is remodeling/destruction of the medial wall of the left maxillary antrum.  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 Neoplasm is a possibility given the given the bony changes.  ENT consultation suggested.   MRI of the face without and  with contrast may be helpful to evaluate for underlying neoplasm and invasive fungal sinusitis which is not identified on CT.   Original Report Authenticated By: Janeece Riggers, M.D.     Microbiology: No results found for this or any previous visit (from the past 240 hour(s)).   Labs: Basic Metabolic Panel:  Recent Labs Lab 10/18/12 0645 10/19/12 0930 10/19/12 0936 10/19/12 2043 10/20/12 0230  NA 141 136 136 136 137  K 3.3* 2.9* 2.9* 3.8 3.5  CL 102 98 96 103 104  CO2 21 18* 18* 22 21  GLUCOSE 295* 328* 328* 303* 273*  BUN 16 14 14 17 15   CREATININE 1.07  0.93 0.97 1.21 1.06  CALCIUM 9.0 9.0 9.0 8.6 8.5  MG  --  1.8  --   --   --    Liver Function Tests:  Recent Labs Lab 10/17/12 1005 10/18/12 0645 10/20/12 0230  AST 15 20 88*  ALT 16 14  14  ALKPHOS 124* 96 79  BILITOT 1.3* 1.7* 1.0  PROT 7.8 6.7 5.9*  ALBUMIN 3.7 3.1* 2.8*   No results found for this basename: LIPASE, AMYLASE,  in the last 168 hours No results found for this basename: AMMONIA,  in the last 168 hours CBC:  Recent Labs Lab 10/17/12 1005  10/17/12 1501 10/18/12 0645 10/19/12 0936 10/20/12 0230 10/21/12 0550  WBC 7.8  --  11.8* 11.6* 9.3 8.1 5.4  NEUTROABS 6.3  --   --   --   --   --   --   HGB 14.7  < > 16.1 13.9 14.3 12.1* 12.3*  HCT 42.4  < > 45.4 40.3 41.8 35.1* 36.5*  MCV 90.8  --  92.8 93.1 93.7 93.9 94.8  PLT 241  --  263 233 211 179 172  < > = values in this interval not displayed. Cardiac Enzymes:  Recent Labs Lab 10/19/12 0930  TROPONINI <0.30   BNP: BNP (last 3 results)  Recent Labs  05/01/12 0604 05/05/12 0949  PROBNP 2736.0* 399.0*   CBG:  Recent Labs Lab 10/20/12 0806 10/20/12 1111 10/20/12 1708 10/20/12 2050 10/21/12 0620  GLUCAP 270* 281* 278* 339* 347*       Signed:  Baylor Cortez S  [facility] 10/21/2012, 10:42 AM

## 2012-10-21 NOTE — Progress Notes (Signed)
10/21/2012 3:05 PM Nursing note Discharge avs, rx, medications already taken today and those due this evening given and explained to patient and family. Follow up appointments and when to call MD reviewed. Pt. Performed return demonstration for drawing up and administering insulin injections as well as how to check a blood sugar. Pt. Viewed educational videos on diabetes prior to discharge. Sliding scale for novolog insulin reviewed. Questions and concerns addressed. Pt. Given xarelto card at discharge.  D/c iv line. D/c tele. D/c home per orders.  Jodel Mayhall, Blanchard Kelch

## 2012-10-29 ENCOUNTER — Ambulatory Visit: Payer: BC Managed Care – PPO | Admitting: Internal Medicine

## 2012-10-29 DIAGNOSIS — Z0289 Encounter for other administrative examinations: Secondary | ICD-10-CM

## 2012-11-05 ENCOUNTER — Other Ambulatory Visit (INDEPENDENT_AMBULATORY_CARE_PROVIDER_SITE_OTHER): Payer: BC Managed Care – PPO

## 2012-11-05 ENCOUNTER — Encounter: Payer: Self-pay | Admitting: Internal Medicine

## 2012-11-05 ENCOUNTER — Ambulatory Visit (INDEPENDENT_AMBULATORY_CARE_PROVIDER_SITE_OTHER): Payer: BC Managed Care – PPO | Admitting: Internal Medicine

## 2012-11-05 VITALS — BP 120/80 | HR 59 | Temp 97.9°F | Resp 16 | Wt 189.0 lb

## 2012-11-05 DIAGNOSIS — I4891 Unspecified atrial fibrillation: Secondary | ICD-10-CM

## 2012-11-05 DIAGNOSIS — E785 Hyperlipidemia, unspecified: Secondary | ICD-10-CM

## 2012-11-05 DIAGNOSIS — Z0000 Encounter for general adult medical examination without abnormal findings: Secondary | ICD-10-CM

## 2012-11-05 DIAGNOSIS — I10 Essential (primary) hypertension: Secondary | ICD-10-CM

## 2012-11-05 LAB — COMPREHENSIVE METABOLIC PANEL
AST: 24 U/L (ref 0–37)
Albumin: 3.8 g/dL (ref 3.5–5.2)
Alkaline Phosphatase: 86 U/L (ref 39–117)
BUN: 16 mg/dL (ref 6–23)
Potassium: 4.1 mEq/L (ref 3.5–5.1)
Sodium: 138 mEq/L (ref 135–145)
Total Bilirubin: 1 mg/dL (ref 0.3–1.2)
Total Protein: 7.5 g/dL (ref 6.0–8.3)

## 2012-11-05 LAB — FECAL OCCULT BLOOD, GUAIAC: Fecal Occult Blood: NEGATIVE

## 2012-11-05 MED ORDER — ROSUVASTATIN CALCIUM 10 MG PO TABS: 10.0000 mg | ORAL_TABLET | Freq: Every day | ORAL | Status: DC

## 2012-11-05 NOTE — Assessment & Plan Note (Signed)
I will recheck his blood sugar and renal function today He wants to see an ENDO I have asked him to get an eye exam done

## 2012-11-05 NOTE — Assessment & Plan Note (Signed)
Start crestor today

## 2012-11-05 NOTE — Patient Instructions (Addendum)

## 2012-11-05 NOTE — Assessment & Plan Note (Signed)
He has good rate and rhythm control today. 

## 2012-11-05 NOTE — Progress Notes (Signed)
Subjective:    Patient ID: [name] [name], male    DOB: 1951/06/18, 62 y.o.   MRN: [id number]  Diabetes He presents for his follow-up diabetic visit. He has type 2 diabetes mellitus. His disease course has been stable. There are no hypoglycemic associated symptoms. Pertinent negatives for hypoglycemia include no dizziness, pallor, speech difficulty or tremors. Pertinent negatives for diabetes include no blurred vision, no chest pain, no fatigue, no foot paresthesias, no foot ulcerations, no polydipsia, no polyphagia, no polyuria, no visual change, no weakness and no weight loss. There are no hypoglycemic complications. Diabetic complications include nephropathy. Current diabetic treatment includes intensive insulin program, insulin injections and oral agent (monotherapy). He is compliant with treatment all of the time. His weight is stable. He is following a generally healthy diet. Meal planning includes avoidance of concentrated sweets. He has not had a previous visit with a dietician. He participates in exercise intermittently. His home blood glucose trend is decreasing steadily. His breakfast blood glucose range is generally 110-130 mg/dl. His lunch blood glucose range is generally 130-140 mg/dl. His dinner blood glucose range is generally 130-140 mg/dl. His highest blood glucose is 140-180 mg/dl. His overall blood glucose range is 130-140 mg/dl. An ACE inhibitor/angiotensin II receptor blocker is contraindicated. He does not see a podiatrist.Eye exam is not current.      Review of Systems  Constitutional: Negative.  Negative for fever, chills, weight loss, diaphoresis, activity change, appetite change, fatigue and unexpected weight change.  HENT: Negative.   Eyes: Negative.  Negative for blurred vision.  Respiratory: Negative.  Negative for apnea, cough, choking, chest tightness, shortness of breath, wheezing and stridor.   Cardiovascular: Negative.  Negative for chest pain, palpitations and leg  swelling.  Gastrointestinal: Negative.  Negative for nausea, vomiting, abdominal pain, diarrhea, constipation and blood in stool.  Endocrine: Negative.  Negative for polydipsia, polyphagia and polyuria.  Genitourinary: Negative.   Musculoskeletal: Negative.  Negative for myalgias, back pain, joint swelling, arthralgias and gait problem.  Skin: Negative.  Negative for color change, pallor, rash and wound.  Allergic/Immunologic: Negative.   Neurological: Negative.  Negative for dizziness, tremors, speech difficulty, weakness, light-headedness and numbness.  Hematological: Negative.  Negative for adenopathy. Does not bruise/bleed easily.  Psychiatric/Behavioral: Negative.        Objective:   Physical Exam  Vitals reviewed. Constitutional: He is oriented to person, place, and time. He appears well-developed and well-nourished. No distress.  HENT:  Head: Normocephalic and atraumatic.  Mouth/Throat: Oropharynx is clear and moist. No oropharyngeal exudate.  Eyes: Conjunctivae are normal. Right eye exhibits no discharge. Left eye exhibits no discharge. No scleral icterus.  Neck: Normal range of motion. Neck supple. No JVD present. No tracheal deviation present. No thyromegaly present.  Cardiovascular: Normal rate, regular rhythm, normal heart sounds and intact distal pulses.  Exam reveals no gallop and no friction rub.   No murmur heard. Pulmonary/Chest: Effort normal and breath sounds normal. No stridor. No respiratory distress. He has no wheezes. He has no rales. He exhibits no tenderness.  Abdominal: Soft. Bowel sounds are normal. He exhibits no distension and no mass. There is no tenderness. There is no rebound and no guarding. Hernia confirmed negative in the right inguinal area and confirmed negative in the left inguinal area.  Genitourinary: Rectum normal, testes normal and penis normal. Rectal exam shows no external hemorrhoid, no internal hemorrhoid, no fissure, no mass, no tenderness and  anal tone normal. Guaiac negative stool. Prostate is not enlarged and not tender.  Right testis shows no mass, no swelling and no tenderness. Right testis is descended. Left testis shows no mass, no swelling and no tenderness. Left testis is descended. Uncircumcised. No phimosis, paraphimosis, hypospadias, penile erythema or penile tenderness. No discharge found.  Musculoskeletal: Normal range of motion. He exhibits no edema and no tenderness.  Lymphadenopathy:    He has no cervical adenopathy.       Right: No inguinal adenopathy present.       Left: No inguinal adenopathy present.  Neurological: He is oriented to person, place, and time.  Skin: Skin is warm and dry. No rash noted. He is not diaphoretic. No erythema. No pallor.  Psychiatric: He has a normal mood and affect. His behavior is normal. Judgment and thought content normal.     Lab Results  Component Value Date   WBC 5.4 10/21/2012   HGB 12.3* 10/21/2012   HCT 36.5* 10/21/2012   PLT 172 10/21/2012   GLUCOSE 273* 10/20/2012   CHOL 223* 10/19/2012   TRIG 258* 10/19/2012   HDL 38* 10/19/2012   LDLDIRECT 101.4 05/05/2012   LDLCALC 133* 10/19/2012   ALT 14 10/20/2012   AST 88* 10/20/2012   NA 137 10/20/2012   K 3.5 10/20/2012   CL 104 10/20/2012   CREATININE 1.06 10/20/2012   BUN 15 10/20/2012   CO2 21 10/20/2012   TSH 1.412 10/19/2012   INR 0.91 10/19/2012   HGBA1C 13.7* 10/17/2012       Assessment & Plan:

## 2012-11-05 NOTE — Assessment & Plan Note (Signed)
Exam done I will check his PSA Vaccines were reviewed Pt ed material was given

## 2012-11-05 NOTE — Assessment & Plan Note (Signed)
His BP is well controlled Today I will check his lytes and renal function 

## 2012-11-19 ENCOUNTER — Telehealth: Payer: Self-pay | Admitting: Internal Medicine

## 2012-11-19 MED ORDER — GLUCOSE BLOOD VI STRP: ORAL_STRIP | Status: DC

## 2012-11-19 NOTE — Telephone Encounter (Signed)
Pt called req refill for One Touch Ultra test strip to be send into [facility]. Pease advise

## 2012-11-19 NOTE — Telephone Encounter (Signed)
RX sent escript

## 2012-11-27 ENCOUNTER — Ambulatory Visit (INDEPENDENT_AMBULATORY_CARE_PROVIDER_SITE_OTHER): Payer: BC Managed Care – PPO | Admitting: Internal Medicine

## 2012-11-27 ENCOUNTER — Encounter: Payer: Self-pay | Admitting: Internal Medicine

## 2012-11-27 VITALS — BP 132/82 | HR 66 | Temp 98.4°F | Resp 10 | Ht 68.5 in | Wt 194.0 lb

## 2012-11-27 NOTE — Progress Notes (Signed)
Patient ID: [name] [name], male   DOB: 1950-09-10, 62 y.o.   MRN: [id number] HPI: [name] [name] is a 62 y.o.-year-old male, referred by his PCP, Dr.Jones, for management of DM2, non-insulin-dependent, uncontrolled, without complications.  Patient has been diagnosed with diabetes ~6 mo ago; at that time, hemoglobin A1c was 7.1. He was not taking any medicines and tried to control his DM with diet and exercise. He then started to develop increased thirst and urination. This progressed to the point when he was urinating every 15 minutes. He started to have altered mental status and was taken to the hospital on 10/17/2012. He was found to have HH NK with metabolic encephalopathy, and had sugars in the 500s.  At that time, his hemoglobin A1c was: Lab Results  Component Value Date   HGBA1C 13.7* 10/17/2012    Since the hospitalization, he started insulin. He brings his log with him, which we reviewed together. Immediately after discharge, his sugars were in the 280's-300,but then they started to decrease and they were in the 140-150s about 3 weeks ago, now improved even further to 100-130s. He stopped writing his sugars down approximately 2-1/2 weeks ago. Pt checks sugars 3x a day and they are: - am: 100-157, but ave. 127-137 - before lunch: 117-121 - before dinner: (snacks in pm): 120-130s No lows. Lowest sugar was 100; does not know if has hypoglycemia awareness. Highest sugar was 500s in the hospital.  Pt is on a regimen of: - Metformin 500 mg po bid with meals - does not like it b/c flatulence and eructation, but these are not very bothersome - Levemir 20 units qam - Novolog SSi units tid ac  He cooks well. He is a Scientist, research (medical).   Pt has chronic kidney disease, last BUN/creatinine was:  Lab Results  Component Value Date   BUN 16 11/05/2012   CREATININE 1.4 11/05/2012   Last set of lipids: Lab Results  Component Value Date   CHOL 223* 10/19/2012   HDL 38* 10/19/2012   LDLCALC 133*  10/19/2012   LDLDIRECT 101.4 05/05/2012   TRIG 258* 10/19/2012   CHOLHDL 5.9 10/19/2012   Pt's last eye exam was 15 years ago. He will have an eye exam next month. Denies numbness and tingling in his legs. Denies increased thirst and urination.  I reviewed his chart and he also has a history of nonischemic cardiomyopathy, with the last 2-D echo performed in the hospital during his previous hospitalization, showing an EF of 65-70%, grade 1 diastolic dysfunction, severe LV H., normal LV size and mildly dilated LA. He also has hyperlipidemia-on Crestor, history of atrial fibrillation, COPD and GERD. No h/o pancreatitis.   Pt has FH of DM in daughter, aunts.  ROS: Constitutional: no weight gain/loss, no fatigue, no subjective hyperthermia/hypothermia Eyes: no blurry vision, no xerophthalmia ENT: no sore throat, no nodules palpated in throat, no dysphagia/odynophagia, no hoarseness Cardiovascular: no CP/SOB/palpitations/leg swelling Respiratory: no cough/SOB Gastrointestinal: no N/V/D/C Musculoskeletal: no muscle/joint aches Skin: no rashes Neurological: no tremors/numbness/tingling/dizziness Psychiatric: no depression/anxiety  Past Medical History  Diagnosis Date  . GERD (gastroesophageal reflux disease)   . Allergy   . Diverticulitis   . History of colon polyps   . COPD (chronic obstructive pulmonary disease)   . Cardiomyopathy 2011    Patient was told that he had an ejection fraction of 37% by echo done elsewhere in 2011  . Hypertension   . Tobacco abuse   . Asthma   . Diabetes mellitus without complication  Past Surgical History  Procedure Laterality Date  . Colon surgery  2009   History   Social History  . Marital Status: Divorced    Spouse Name: N/A    Number of Children: N/A  . Years of Education: N/A   Occupational History  . Former Charity fundraiser   Social History Main Topics  . Smoking status: Former Smoker    Types: Cigarettes  . Smokeless tobacco: Former Neurosurgeon     Quit date: 10/28/2012     Comment: Smoked since age 35  . Alcohol Use: 0.0 oz/week     Comment: ? pt states 1x/month.   . Drug Use: No  . Sexually Active: Not Currently   Current Outpatient Prescriptions on File Prior to Visit  Medication Sig Dispense Refill  . albuterol (PROVENTIL HFA;VENTOLIN HFA) 108 (90 BASE) MCG/ACT inhaler Inhale 2 puffs into the lungs every 6 (six) hours as needed for wheezing.      . diltiazem (CARDIZEM CD) 120 MG 24 hr capsule Take 1 capsule (120 mg total) by mouth daily.  30 capsule  2  . fish oil-omega-3 fatty acids 1000 MG capsule Take 2 g by mouth daily.       Marland Kitchen glucose blood (ONE TOUCH ULTRA TEST) test strip Test up to bid  100 each  12  . hydrochlorothiazide (HYDRODIURIL) 25 MG tablet Take 0.5 tablets (12.5 mg total) by mouth daily.  30 tablet  2  . insulin aspart (NOVOLOG) 100 UNIT/ML injection Inject 0-15 Units into the skin 3 (three) times daily with meals.  1 vial  2  . insulin detemir (LEVEMIR) 100 UNIT/ML injection Inject 0.2 mLs (20 Units total) into the skin daily.  10 mL  2  . metFORMIN (GLUCOPHAGE) 500 MG tablet Take 1 tablet (500 mg total) by mouth 2 (two) times daily with a meal.  60 tablet  3  . metoprolol succinate (TOPROL-XL) 50 MG 24 hr tablet Take 50 mg by mouth daily. Take with or immediately following a meal.      . omeprazole (PRILOSEC) 20 MG capsule Take 20 mg by mouth daily.      . Rivaroxaban (XARELTO) 20 MG TABS Take 1 tablet (20 mg total) by mouth daily.  30 tablet  3  . rosuvastatin (CRESTOR) 10 MG tablet Take 1 tablet (10 mg total) by mouth daily.  112 tablet  0   No current facility-administered medications on file prior to visit.   Allergies  Allergen Reactions  . Hydralazine Shortness Of Breath  . Lisinopril Cough  . Penicillins     Whelps  . Tribenzor (Olmesartan-Amlodipine-Hctz) Nausea Only    dizziness    Family History  Problem Relation Age of Onset  . Arthritis Other     parents  . Cancer Other     Colon,Breast,  Prostate Cancer  . Hypertension Other   . Diabetes Other     parents  . Colon cancer Mother   . Prostate cancer Father    PE: BP 132/82  [name] 64  Temp(Src) 98.4 F (36.9 C) (Oral)  Resp 10  Ht 5' 8.5" (1.74 m)  Wt 194 lb (87.998 kg)  BMI 29.07 kg/m2  SpO2 96% Wt Readings from Last 3 Encounters:  11/27/12 194 lb (87.998 kg)  11/05/12 189 lb (85.73 kg)  10/21/12 196 lb 3.4 oz (89 kg)   Constitutional: overweight, in NAD Eyes: PERRLA, EOMI, no exophthalmos ENT: moist mucous membranes, no thyromegaly, no cervical lymphadenopathy Cardiovascular: RRR, No MRG Respiratory: CTA B  Gastrointestinal: abdomen soft, NT, ND, BS+ Musculoskeletal: no deformities, strength intact in all 4 Skin: moist, warm, no rashes Neurological: no tremor with outstretched hands, DTR normal in all 4  ASSESSMENT: 1. DM2, insulin-dependent, uncontrolled, with complications (CKD stage III)  PLAN:  1. Patient with newly decompensated diabetes, now on a diabetic regimen that is working really well for him. He is very interested in changing his diet, which she already started to do. He is counting cars and taking his insulin based on cars and a sliding scale. He eats little meat. He will start exercising regularly again, as he enjoys playing tennis. - we discussed about the fact that his sugars are much improved recently on insulin and with reducing the sweets in his diet. In the future, if his sugars continue to stay controlled, we might be able to take him off mealtime insulin and try orals for at least a period of time - for now, we'll continue the same diabetic regimen. We discussed about benefits of metformin, and he decided to continue despite the GI symptoms that it causes him. He mentions that these are not very bothersome. However, because of these, we'll keep him on only half target dose of metformin - I advised him to move Levemir at night - He is interested to get nutrition advice and I will refer him  to the diabetes education Department - given sugar log and advised how to fill it and to bring it at next appt - Check sugars 2-3 times a day for now - given foot care handout and explained the principles - given instructions for hypoglycemia management "15-15 rule" - I would see him back in 2 months with his sugar log

## 2012-11-27 NOTE — Patient Instructions (Signed)
Please return in 2 months with your sugar log.   PATIENT INSTRUCTIONS FOR TYPE 2 DIABETES:  **Please join MyChart!** - see attached instructions about how to join   DIET AND EXERCISE Diet and exercise is an important part of diabetic treatment.  We recommended aerobic exercise in the form of brisk walking (working between 40-60% of maximal aerobic capacity, similar to brisk walking) for 150 minutes per week (such as 30 minutes five days per week) along with 3 times per week performing 'resistance' training (using various gauge rubber tubes with handles) 5-10 exercises involving the major muscle groups (upper body, lower body and core) performing 10-15 repetitions (or near fatigue) each exercise. Start at half the above goal but build slowly to reach the above goals. If limited by weight, joint pain, or disability, we recommend daily walking in a swimming pool with water up to waist to reduce pressure from joints while allow for adequate exercise.    BLOOD GLUCOSES Monitoring your blood glucoses is important for continued management of your diabetes. Please check your blood glucoses 2-4 times a day: fasting, before meals and at bedtime (you can rotate these measurements - e.g. one day check before the 3 meals, the next day check before 2 of the meals and before bedtime, etc.   HYPOGLYCEMIA (low blood sugar) Hypoglycemia is usually a reaction to not eating, exercising, or taking too much insulin/ other diabetes drugs.  Symptoms include tremors, sweating, hunger, confusion, headache, etc. Treat IMMEDIATELY with 15 grams of Carbs:   4 glucose tablets    cup regular juice/soda   2 tablespoons raisins   4 teaspoons sugar   1 tablespoon honey Recheck blood glucose in 15 mins and repeat above if still symptomatic/blood glucose <100. Please contact our office at [phone] if you have questions about how to next handle your insulin.  RECOMMENDATIONS TO REDUCE YOUR RISK OF DIABETIC  COMPLICATIONS: * Take your prescribed MEDICATION(S). * Follow a DIABETIC diet: Complex carbs, fiber rich foods, heart healthy fish twice weekly, (monounsaturated and polyunsaturated) fats * AVOID saturated/trans fats, high fat foods, >2,300 mg salt per day. * EXERCISE at least 5 times a week for 30 minutes or preferably daily.  * DO NOT SMOKE OR DRINK more than 1 drink a day. * Check your FEET every day. Do not wear tightfitting shoes. Contact us if you develop an ulcer * See your EYE doctor once a year or more if needed * Get a FLU shot once a year * Get a PNEUMONIA vaccine once before and once after age 56 years  GOALS:  * Your Hemoglobin A1c of <7%  * Your Systolic BP should be 140 or lower  * Your Diastolic BP should be 80 or lower  * Your HDL (Good Cholesterol) should be 40 or higher  * Your LDL (Bad Cholesterol) should be 100 or lower  * Your Triglycerides should be 150 or lower  * Your Urine microalbumin (kidney function) should be <30 * Your Body Mass Index should be 25 or lower   We will be glad to help you achieve these goals. Our telephone number is: [phone].

## 2012-12-01 ENCOUNTER — Ambulatory Visit: Payer: BC Managed Care – PPO | Admitting: Dietician

## 2012-12-23 ENCOUNTER — Telehealth: Payer: Self-pay

## 2012-12-23 MED ORDER — ALBUTEROL SULFATE HFA 108 (90 BASE) MCG/ACT IN AERS: 2.0000 | INHALATION_SPRAY | Freq: Four times a day (QID) | RESPIRATORY_TRACT | Status: DC | PRN

## 2012-12-23 NOTE — Telephone Encounter (Signed)
Patient called in requesting a refill on Albuterol

## 2013-01-04 ENCOUNTER — Other Ambulatory Visit: Payer: Self-pay | Admitting: *Deleted

## 2013-01-04 MED ORDER — GLUCOSE BLOOD VI STRP: ORAL_STRIP | Status: DC

## 2013-01-12 ENCOUNTER — Other Ambulatory Visit: Payer: Self-pay | Admitting: Internal Medicine

## 2013-01-22 ENCOUNTER — Other Ambulatory Visit: Payer: Self-pay

## 2013-01-22 MED ORDER — DILTIAZEM HCL ER COATED BEADS 120 MG PO CP24: 120.0000 mg | ORAL_CAPSULE | Freq: Every day | ORAL | Status: DC

## 2013-01-23 ENCOUNTER — Other Ambulatory Visit: Payer: Self-pay | Admitting: Internal Medicine

## 2013-01-28 ENCOUNTER — Ambulatory Visit: Payer: BC Managed Care – PPO | Admitting: Internal Medicine

## 2013-02-11 ENCOUNTER — Ambulatory Visit (INDEPENDENT_AMBULATORY_CARE_PROVIDER_SITE_OTHER): Payer: BC Managed Care – PPO | Admitting: Internal Medicine

## 2013-02-11 ENCOUNTER — Encounter: Payer: Self-pay | Admitting: Internal Medicine

## 2013-02-11 VITALS — BP 108/62 | HR 73 | Temp 97.7°F | Resp 16 | Ht 68.5 in | Wt 206.0 lb

## 2013-02-11 DIAGNOSIS — IMO0001 Reserved for inherently not codable concepts without codable children: Secondary | ICD-10-CM

## 2013-02-11 LAB — MICROALBUMIN / CREATININE URINE RATIO
Microalb Creat Ratio: 0.6 mg/g (ref 0.0–30.0)
Microalb, Ur: 1.8 mg/dL (ref 0.0–1.9)

## 2013-02-11 MED ORDER — LINAGLIPTIN 5 MG PO TABS: 5.0000 mg | ORAL_TABLET | Freq: Every day | ORAL | Status: DC

## 2013-02-11 MED ORDER — "INSULIN SYRINGE 31G X 5/16"" 0.3 ML MISC": 1.0000 | Freq: Every day | Status: DC

## 2013-02-11 NOTE — Progress Notes (Signed)
Patient ID: [name] [name], male   DOB: [birth year], 62 y.o.   MRN: [id number]  HPI: [name] [name] is a 62 y.o.-year-old male, returning for f/u for DM2, dx in 06/2012 non-insulin-dependent, uncontrolled, with complications (CKD stage 3). Last visit 2.5 mo ago.  Last hemoglobin A1c was: Lab Results  Component Value Date   HGBA1C 13.7* 10/17/2012  He started insulin in 09/2012.   Pt is on a regimen of: - Metformin 500 mg po bid with meals - does not like it b/c flatulence and eructation, but these are not very bothersome - Levemir 20 units qhs - Novolog SSi units tid ac depending to carbs: 3-6 units He heard about Victoza, but it is not very eager to try newer medicines.  He brings his log with him, which we reviewed together. He stopped writing his sugars down for this month, but he mentions they are similar with the ones in May and June. He checks sugars 2-3x a day and they are: - am: 100-157, but ave. 127-137 >> 101-120 - 2h after b'fast: 105-168 - before lunch: 117-121 >> 90-131, but one high at 183. - before dinner: (snacks in pm, mainly chocolate): 120-130s >> 122-198 - 2h after dinner: 122-131 - bedtime: 119-164 No lows. Lowest sugar was 90; does not know if has hypoglycemia awareness. Highest sugar was 211, but not in last month.  He cooks well. 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  Pt has chronic kidney disease, last BUN/creatinine was:  Lab Results  Component Value Date   BUN 16 11/05/2012   CREATININE 1.4 11/05/2012   Last set of lipids: Lab Results  Component Value Date   CHOL 223* 10/19/2012   HDL 38* 10/19/2012   LDLCALC 133* 10/19/2012   LDLDIRECT 101.4 05/05/2012   TRIG 258* 10/19/2012   CHOLHDL 5.9 10/19/2012   Pt's last eye exam was 15 years ago. He was scheduled to have an eye exam but could not make it - will reschedule. Denies numbness and tingling in his legs. Denies increased thirst and  urination or any blurry vision.  He also has a history of nonischemic cardiomyopathy, with the last 2-D echo performed in the hospital during his previous hospitalization, showing an EF of 65-70%, grade 1 diastolic dysfunction, severe LV H, normal LV size and mildly dilated LA. He also has hyperlipidemia-on Crestor - but he tells me that he is not compliant with it ("I take too many medications"), history of atrial fibrillation, COPD and GERD. No h/o pancreatitis.   I reviewed pt's medications, allergies, PMH, social hx, family hx and no changes required, except as mentioned above. He was on Losartan, but was told to stop this at last hospitalization.  ROS: Constitutional: + weight gain, no fatigue, no subjective hyperthermia/hypothermia Eyes: no blurry vision, no xerophthalmia ENT: no sore throat, no nodules palpated in throat, no dysphagia/odynophagia, no hoarseness Cardiovascular: no CP/SOB/palpitations/leg swelling Respiratory: no cough/SOB, + wheezing Gastrointestinal: no N/V/+ D/no C Musculoskeletal: no muscle/+ joint aches Skin: no rashes, + itching Neurological: no tremors/numbness/tingling/dizziness  PE: BP 108/62  Pulse 73  Temp(Src) 97.7 F (36.5 C) (Oral)  Resp 16  Ht 5' 8.5" (1.74 m)  Wt 206 lb (93.441 kg)  BMI 30.86 kg/m2  SpO2 95% Wt Readings from Last 3 Encounters:  02/11/13 206 lb (93.441 kg)  11/27/12 194 lb (87.998 kg)  11/05/12 189 lb (85.73 kg)  gained 12 lbs  Constitutional: overweight, in NAD  Eyes: PERRLA, EOMI, no exophthalmos ENT: moist mucous membranes, no thyromegaly, no cervical lymphadenopathy Cardiovascular: RRR, No MRG Respiratory: CTA B Gastrointestinal: abdomen soft, NT, ND, BS+ Musculoskeletal: no deformities, strength intact in all 4 Skin: moist, warm, no rashes Neurological: no tremor with outstretched hands, DTR normal in all 4  ASSESSMENT: 1. DM2, insulin-dependent, uncontrolled, with complications - CKD stage III  PLAN:  1.  Patient with improved diabetes control after addition of insulin.  He started to change his diet but still eats too much chocolate per his report and this is the reason for his occasional higher blood sugars. He will start exercising regularly again, as he enjoys playing tennis. Overall, sugars much improved.  - I discussed with him that we can possibly give it a try without his mealtime insulin, however with continuing the Levemir and the metformin. I suggested him to start Victoza, which will give him a chance to lose weight, but he is not very eager to start this, since it is a relatively new medicine, without a lot of market experience per his reasearch. We also discussed about Invokana, but I do not feel that this is a good option for him, since he has chronic kidney disease. Bromocriptine is another option, but this might get expensive. Therefore, we decided to try Linagliptin (Tradjenta) 5 mg daily. I advised him to continue to stay active and starts playing tennis with, which will greatly improve his insulin resistance. - continue metformin 500 mg bid - continue Levemir 20 units in hs - will check an ACR today - I ordered a Hba1c and a CMP to be drawn at next appt with PCP which will be in the next 2 weeks - refill his syringes - he prefers vials to pens. I advised him to discard the vial after 30 days.  - I will  see him back in 3 months with his sugar log - continue to check sugars 2-3 times a day  Office Visit on 02/11/2013  Component Date Value Range Status  . Microalb, Ur 02/11/2013 1.8  0.0 - 1.9 mg/dL Final  . Creatinine,U 16/04/9603 278.2   Final  . Microalb Creat Ratio 02/11/2013 0.6  0.0 - 30.0 mg/g Final   No MAU.Will call pt.

## 2013-02-11 NOTE — Patient Instructions (Addendum)
Please stop NovoLog and start Linagliptin (Tradjenta) instead. Take 1 TAB of 5 mg before breakfast. Continue Levemir and Metformin at the current doses. Stay active. Please ask Dr. Yetta Barre to check a HbA1C and a CMP at next visit with him (I entered the lab orders). Please stop at the lab to have a urine checked for proteins.

## 2013-02-16 ENCOUNTER — Other Ambulatory Visit: Payer: Self-pay | Admitting: Internal Medicine

## 2013-02-18 ENCOUNTER — Telehealth: Payer: Self-pay | Admitting: Internal Medicine

## 2013-02-19 NOTE — Telephone Encounter (Signed)
Left message on machine for patient to return our call 

## 2013-02-19 NOTE — Telephone Encounter (Signed)
Patient has had a couple of elevated blood sugar readings in the past week.  His glucose can go to 220-183.  He feels a though the tragenta is helping, but he has been taking 2 units of Novolog to bring is glucose down.  He would like to know if this is okay to continue?  Please advise.

## 2013-02-19 NOTE — Telephone Encounter (Signed)
Depending on when the sugars are elevated, he can reintroduce 3 units of insulin with his meals, I think even a reduction of NovoLog insulin dose with the Jearld Lesch is a good thing. Please tell him to call us again next week to let us know if this helps.

## 2013-02-22 ENCOUNTER — Telehealth: Payer: Self-pay | Admitting: *Deleted

## 2013-02-22 NOTE — Telephone Encounter (Signed)
Called pt again and told him per Dr Elvera Lennox; depending on when the sugars are elevated, he can reintroduce 3 units of insulin with his meals, I think even a reduction of NovoLog insulin dose with the Jearld Lesch is a good thing. Pt stated that is what he is doing. He states his bg levels have been better over the weekend. They were 124, 101, 111 and 110. He states he will call if there are any changes.

## 2013-03-15 ENCOUNTER — Telehealth: Payer: Self-pay | Admitting: Internal Medicine

## 2013-03-15 ENCOUNTER — Other Ambulatory Visit: Payer: Self-pay | Admitting: *Deleted

## 2013-03-15 MED ORDER — INSULIN DETEMIR 100 UNIT/ML ~~LOC~~ SOLN: 20.0000 [IU] | Freq: Every day | SUBCUTANEOUS | Status: DC

## 2013-03-15 NOTE — Telephone Encounter (Signed)
Need to send rx electronically.

## 2013-03-23 LAB — HM DIABETES EYE EXAM

## 2013-03-31 ENCOUNTER — Telehealth: Payer: Self-pay | Admitting: Internal Medicine

## 2013-03-31 NOTE — Telephone Encounter (Signed)
Please read note below about rx for Metformin and advise. Thank you.

## 2013-03-31 NOTE — Telephone Encounter (Signed)
Prescribed RX for Metformin in the hospital, needs refill called to continue. Please call in script for patient to continue Metformin / Sherri S.

## 2013-03-31 NOTE — Telephone Encounter (Signed)
Ok to refill 

## 2013-04-01 ENCOUNTER — Telehealth: Payer: Self-pay | Admitting: Internal Medicine

## 2013-04-01 MED ORDER — METFORMIN HCL 500 MG PO TABS: 500.0000 mg | ORAL_TABLET | Freq: Two times a day (BID) | ORAL | Status: DC

## 2013-04-01 NOTE — Telephone Encounter (Signed)
Rx sent 

## 2013-04-02 ENCOUNTER — Telehealth: Payer: Self-pay | Admitting: Internal Medicine

## 2013-04-02 NOTE — Telephone Encounter (Signed)
Opened encounter in error  

## 2013-05-04 ENCOUNTER — Ambulatory Visit: Payer: BC Managed Care – PPO | Admitting: Internal Medicine

## 2013-05-13 ENCOUNTER — Ambulatory Visit: Payer: BC Managed Care – PPO | Admitting: Internal Medicine

## 2013-05-18 ENCOUNTER — Encounter: Payer: Self-pay | Admitting: Internal Medicine

## 2013-05-18 ENCOUNTER — Ambulatory Visit (INDEPENDENT_AMBULATORY_CARE_PROVIDER_SITE_OTHER): Payer: BC Managed Care – PPO | Admitting: Internal Medicine

## 2013-05-18 ENCOUNTER — Ambulatory Visit: Payer: BC Managed Care – PPO | Admitting: Internal Medicine

## 2013-05-18 VITALS — BP 140/90 | HR 69 | Temp 97.9°F | Resp 10 | Wt 211.6 lb

## 2013-05-18 DIAGNOSIS — Z23 Encounter for immunization: Secondary | ICD-10-CM

## 2013-05-18 NOTE — Patient Instructions (Addendum)
Keep up the great work! Decrease Levemir to 17 units. If sugars in am >120, then increase it back. Can try to increase metformin to 1000 mg 2x a day with meals. Please stop at the lab. Return in 3 months.

## 2013-05-18 NOTE — Progress Notes (Signed)
Patient ID: [name] [name], male   DOB: 02-Oct-1950, 62 y.o.   MRN: [id number]  HPI: [name] [name] is a 62 y.o.-year-old male, returning for f/u for DM2, dx in 06/2012, insulin-dependent since 09/2012, uncontrolled, with complications (CKD stage 3). Last visit 3 mo ago.  Last hemoglobin A1c was: Lab Results  Component Value Date   HGBA1C 13.7* 10/17/2012   Pt is on a regimen of: - Metformin 500 mg po bid with meals - had flatulence and eructation >> improved - Levemir 20 units qhs - Novolog SSi  - Tradjenta 5 mg daily - at last visit we stopped mealtime insulin and started this He heard about Victoza, but it is not very eager to try newer medicines.  He brings his log with him, which we reviewed together. He checks sugars 2-3x a day and they are: - am: 100-157, but ave. 127-137 >> 101-120 >> 101-123 - 2h after b'fast: 105-168 >> not cheching - before lunch: 117-121 >> 90-131, but one high at 183 >> 97-147 - before dinner: (snacks in pm, mainly chocolate): 120-130s >> 122-198 >> 101-173 - 2h after dinner: 122-131 >> not checking - bedtime: 119-164 >> not checking No lows. Lowest sugar was 97; does not know if has hypoglycemia awareness. Highest sugar was 199.  Pt has chronic kidney disease, last BUN/creatinine was:  Lab Results  Component Value Date   BUN 16 11/05/2012   CREATININE 1.4 11/05/2012  Last ACR 0.6 12/2012. Last set of lipids: Lab Results  Component Value Date   CHOL 223* 10/19/2012   HDL 38* 10/19/2012   LDLCALC 133* 10/19/2012   LDLDIRECT 101.4 05/05/2012   TRIG 258* 10/19/2012   CHOLHDL 5.9 10/19/2012   Pt's last eye exam was 15 years ago. He has an eye exam scheduled. Denies numbness and tingling in his legs.  Denies increased thirst and urination or any blurry vision.  He also has a history of nonischemic cardiomyopathy, with the last 2-D echo showing an EF of 65-70%, grade 1 diastolic dysfunction, severe LV H, normal LV size and mildly dilated LA. He also has  hyperlipidemia-on Crestor - but not compliant with it ("I take too many medications"), history of atrial fibrillation, COPD and GERD. No h/o pancreatitis.   I reviewed pt's medications, allergies, PMH, social hx, family hx and no changes required, except as mentioned above.   ROS: Constitutional: + weight gain, no fatigue, no subjective hyperthermia/hypothermia Eyes: no blurry vision, no xerophthalmia ENT: no sore throat, no nodules palpated in throat, no dysphagia/odynophagia, no hoarseness Cardiovascular: no CP/SOB/palpitations/leg swelling Respiratory: no cough/SOB, + wheezing Gastrointestinal: no N/V/D/C, + heartburn Musculoskeletal: no muscle/ joint aches Skin: no rashes Neurological: no tremors/numbness/tingling/dizziness  PE: BP 140/90  Pulse 69  Temp(Src) 97.9 F (36.6 C) (Oral)  Resp 10  Wt 211 lb 9.6 oz (95.981 kg)  BMI 31.7 kg/m2  SpO2 97% Wt Readings from Last 3 Encounters:  05/18/13 211 lb 9.6 oz (95.981 kg)  02/11/13 206 lb (93.441 kg)  11/27/12 194 lb (87.998 kg)   Constitutional: overweight, in NAD Eyes: PERRLA, EOMI, no exophthalmos ENT: moist mucous membranes, no thyromegaly, no cervical lymphadenopathy Cardiovascular: RRR, No MRG Respiratory: CTA B Gastrointestinal: abdomen soft, NT, ND, BS+ Musculoskeletal: no deformities, strength intact in all 4 Skin: moist, warm, no rashes Neurological: no tremor with outstretched hands, DTR normal in all 4  ASSESSMENT: 1. DM2, insulin-dependent, uncontrolled, with complications - CKD stage III  PLAN:  1. Patient with improved diabetes control after addition of insulin and  now Tradjenta.  He started to change his diet but still has some dietary indiscretions, the reason for his occasional higher blood sugars. He uses NovoLog for these situations. Overall, sugars are great!.  - at last visit, we discussed to stop the mealtime insulin and try Linagliptin (Tradjenta) 5 mg daily. We did discuss about Victoza, which can  give him a chance to lose weight, but he was not very eager to start this, since it is a relatively new medicine, without a lot of market experience per his reasearch. We also discussed about Invokana, but I did not feel that this was a good option for him, since he has chronic kidney disease. Bromocriptine is another option, but this might get expensive. He is doing great on Trajenta, so will continue this. - continue metformin - I did advise him to try to increase to 1000 mg bid if he can tolerate it - continue Levemir - but I advised him to try to decrease the dose from 20 to 17 units in hs. My hope is to be able to come off Levemir, too, I am not sure yet if this is possible. This would help with his weight gain! - will check a hemoglobin A1c today. [place] saw the vitamin D level, since he is taking more than 5000 units daily. - given more sugar logs - Given flu vaccine today - Will have an appointment with the ophthalmologist soon - I will  see him back in 3 months with his sugar log - continue to check sugars 2-3 times a day  Office Visit on 05/18/2013  Component Date Value Range Status  . Hemoglobin A1C 05/18/2013 7.2* 4.6 - 6.5 % Final   Glycemic Control Guidelines for People with Diabetes:Non Diabetic:  <6%Goal of Therapy: <7%Additional Action Suggested:  >8%   . Vit D, 25-Hydroxy 05/18/2013 24* 30 - 89 ng/mL Final   Comment: This assay accurately quantifies Vitamin D, which is the sum of the                          25-Hydroxy forms of Vitamin D2 and D3.  Studies have shown that the                          optimum concentration of 25-Hydroxy Vitamin D is 30 ng/mL or higher.                           Concentrations of Vitamin D between 20 and 29 ng/mL are considered to                          be insufficient and concentrations less than 20 ng/mL are considered                          to be deficient for Vitamin D.   Hba1c excellent >> what an improvement!  His vit D is still low >>  stop his supplementation and start Ergocalciferol 50,000 IU once a week for 8 weeks - I will send this to his pharm. After he finishes this, restart the current vit D supplementation.

## 2013-05-19 LAB — VITAMIN D 25 HYDROXY (VIT D DEFICIENCY, FRACTURES): Vit D, 25-Hydroxy: 24 ng/mL — ABNORMAL LOW (ref 30–89)

## 2013-05-19 MED ORDER — VITAMIN D (ERGOCALCIFEROL) 1.25 MG (50000 UNIT) PO CAPS: 50000.0000 [IU] | ORAL_CAPSULE | ORAL | Status: DC

## 2013-05-25 ENCOUNTER — Ambulatory Visit: Payer: BC Managed Care – PPO | Admitting: Internal Medicine

## 2013-06-10 NOTE — Telephone Encounter (Signed)
Phone note completed ° °

## 2013-07-14 ENCOUNTER — Encounter: Payer: Self-pay | Admitting: Internal Medicine

## 2013-07-14 ENCOUNTER — Ambulatory Visit (INDEPENDENT_AMBULATORY_CARE_PROVIDER_SITE_OTHER): Payer: BC Managed Care – PPO | Admitting: Internal Medicine

## 2013-07-14 VITALS — BP 174/106 | HR 75 | Temp 97.5°F | Resp 16 | Ht 68.5 in | Wt 208.0 lb

## 2013-07-14 DIAGNOSIS — J209 Acute bronchitis, unspecified: Secondary | ICD-10-CM

## 2013-07-14 DIAGNOSIS — D234 Other benign neoplasm of skin of scalp and neck: Secondary | ICD-10-CM

## 2013-07-14 DIAGNOSIS — I429 Cardiomyopathy, unspecified: Secondary | ICD-10-CM

## 2013-07-14 DIAGNOSIS — I10 Essential (primary) hypertension: Secondary | ICD-10-CM

## 2013-07-14 DIAGNOSIS — I428 Other cardiomyopathies: Secondary | ICD-10-CM

## 2013-07-14 DIAGNOSIS — D224 Melanocytic nevi of scalp and neck: Secondary | ICD-10-CM | POA: Insufficient documentation

## 2013-07-14 MED ORDER — MOXIFLOXACIN HCL 400 MG PO TABS: 400.0000 mg | ORAL_TABLET | Freq: Every day | ORAL | Status: DC

## 2013-07-14 MED ORDER — OLMESARTAN MEDOXOMIL 40 MG PO TABS: 40.0000 mg | ORAL_TABLET | Freq: Every day | ORAL | Status: DC

## 2013-07-14 MED ORDER — HYDROCOD POLST-CPM POLST ER 10-8 MG PO CP12: 1.0000 | ORAL_CAPSULE | Freq: Two times a day (BID) | ORAL | Status: DC | PRN

## 2013-07-14 NOTE — Patient Instructions (Signed)
Acute Bronchitis Bronchitis is inflammation of the airways that extend from the windpipe into the lungs (bronchi). The inflammation often causes mucus to develop. This leads to a cough, which is the most common symptom of bronchitis.  In acute bronchitis, the condition usually develops suddenly and goes away over time, usually in a couple weeks. Smoking, allergies, and asthma can make bronchitis worse. Repeated episodes of bronchitis may cause further lung problems.  CAUSES Acute bronchitis is most often caused by the same virus that causes a cold. The virus can spread from person to person (contagious).  SIGNS AND SYMPTOMS   Cough.   Fever.   Coughing up mucus.   Body aches.   Chest congestion.   Chills.   Shortness of breath.   Sore throat.  DIAGNOSIS  Acute bronchitis is usually diagnosed through a physical exam. Tests, such as chest X-rays, are sometimes done to rule out other conditions.  TREATMENT  Acute bronchitis usually goes away in a couple weeks. Often times, no medical treatment is necessary. Medicines are sometimes given for relief of fever or cough. Antibiotics are usually not needed but may be prescribed in certain situations. In some cases, an inhaler may be recommended to help reduce shortness of breath and control the cough. A cool mist vaporizer may also be used to help thin bronchial secretions and make it easier to clear the chest.  HOME CARE INSTRUCTIONS  Get plenty of rest.   Drink enough fluids to keep your urine clear or pale yellow (unless you have a medical condition that requires fluid restriction). Increasing fluids may help thin your secretions and will prevent dehydration.   Only take over-the-counter or prescription medicines as directed by your health care provider.   Avoid smoking and secondhand smoke. Exposure to cigarette smoke or irritating chemicals will make bronchitis worse. If you are a smoker, consider using nicotine gum or skin  patches to help control withdrawal symptoms. Quitting smoking will help your lungs heal faster.   Reduce the chances of another bout of acute bronchitis by washing your hands frequently, avoiding people with cold symptoms, and trying not to touch your hands to your mouth, nose, or eyes.   Follow up with your health care provider as directed.  SEEK MEDICAL CARE IF: Your symptoms do not improve after 1 week of treatment.  SEEK IMMEDIATE MEDICAL CARE IF:  You develop an increased fever or chills.   You have chest pain.   You have severe shortness of breath.  You have bloody sputum.   You develop dehydration.  You develop fainting.  You develop repeated vomiting.  You develop a severe headache. MAKE SURE YOU:   Understand these instructions.  Will watch your condition.  Will get help right away if you are not doing well or get worse. Document Released: 08/22/2004 Document Revised: 03/17/2013 Document Reviewed: 01/05/2013 [facility] Patient Information 2014 [place], [place]. Hypertension As your heart beats, it forces blood through your arteries. This force is your blood pressure. If the pressure is too high, it is called hypertension (HTN) or high blood pressure. HTN is dangerous because you may have it and not know it. High blood pressure may mean that your heart has to work harder to pump blood. Your arteries may be narrow or stiff. The extra work puts you at risk for heart disease, stroke, and other problems.  Blood pressure consists of two numbers, a higher number over a lower, 110/72, for example. It is stated as "110 over 72." The  ideal is below 120 for the top number (systolic) and under 80 for the bottom (diastolic). Write down your blood pressure today. You should pay close attention to your blood pressure if you have certain conditions such as:  Heart failure.  Prior heart attack.  Diabetes  Chronic kidney disease.  Prior stroke.  Multiple risk factors for  heart disease. To see if you have HTN, your blood pressure should be measured while you are seated with your arm held at the level of the heart. It should be measured at least twice. A one-time elevated blood pressure reading (especially in the Emergency Department) does not mean that you need treatment. There may be conditions in which the blood pressure is different between your right and left arms. It is important to see your caregiver soon for a recheck. Most people have essential hypertension which means that there is not a specific cause. This type of high blood pressure may be lowered by changing lifestyle factors such as:  Stress.  Smoking.  Lack of exercise.  Excessive weight.  Drug/tobacco/alcohol use.  Eating less salt. Most people do not have symptoms from high blood pressure until it has caused damage to the body. Effective treatment can often prevent, delay or reduce that damage. TREATMENT  When a cause has been identified, treatment for high blood pressure is directed at the cause. There are a large number of medications to treat HTN. These fall into several categories, and your caregiver will help you select the medicines that are best for you. Medications may have side effects. You should review side effects with your caregiver. If your blood pressure stays high after you have made lifestyle changes or started on medicines,   Your medication(s) may need to be changed.  Other problems may need to be addressed.  Be certain you understand your prescriptions, and know how and when to take your medicine.  Be sure to follow up with your caregiver within the time frame advised (usually within two weeks) to have your blood pressure rechecked and to review your medications.  If you are taking more than one medicine to lower your blood pressure, make sure you know how and at what times they should be taken. Taking two medicines at the same time can result in blood pressure that is  too low. SEEK IMMEDIATE MEDICAL CARE IF:  You develop a severe headache, blurred or changing vision, or confusion.  You have unusual weakness or numbness, or a faint feeling.  You have severe chest or abdominal pain, vomiting, or breathing problems. MAKE SURE YOU:   Understand these instructions.  Will watch your condition.  Will get help right away if you are not doing well or get worse. Document Released: 07/15/2005 Document Revised: 10/07/2011 Document Reviewed: 03/04/2008 [facility] Patient Information 2014 [place], [place].

## 2013-07-14 NOTE — Progress Notes (Signed)
Pre visit review using our clinic review tool, if applicable. No additional management support is needed unless otherwise documented below in the visit note. 

## 2013-07-14 NOTE — Progress Notes (Signed)
Subjective:    Patient ID: [name] [name], male    DOB: 1951-07-14, 62 y.o.   MRN: [id number]  Cough This is a new problem. The current episode started in the past 7 days. The problem has been gradually worsening. The problem occurs every few hours. The cough is productive of purulent sputum. Associated symptoms include chills, a sore throat and wheezing. Pertinent negatives include no chest pain, ear congestion, ear pain, fever, headaches, heartburn, hemoptysis, myalgias, nasal congestion, postnasal drip, rash, rhinorrhea, shortness of breath, sweats or weight loss. He has tried a beta-agonist inhaler and steroid inhaler for the symptoms. The treatment provided mild relief. His past medical history is significant for COPD. There is no history of asthma, bronchiectasis, bronchitis, emphysema, environmental allergies or pneumonia.      Review of Systems  Constitutional: Positive for chills. Negative for fever, weight loss, diaphoresis, activity change, appetite change, fatigue and unexpected weight change.  HENT: Positive for sore throat. Negative for ear pain, postnasal drip, rhinorrhea, sinus pressure, tinnitus, trouble swallowing and voice change.   Eyes: Negative.   Respiratory: Positive for cough and wheezing. Negative for apnea, hemoptysis, choking, chest tightness, shortness of breath and stridor.   Cardiovascular: Negative.  Negative for chest pain, palpitations and leg swelling.  Gastrointestinal: Negative.  Negative for heartburn, nausea, vomiting, abdominal pain, diarrhea and constipation.  Endocrine: Negative.   Genitourinary: Negative.   Musculoskeletal: Negative.  Negative for myalgias.  Skin: Negative.  Negative for rash.       He has a mole/skin tag on the right side of his neck that he wants removed  Allergic/Immunologic: Negative.  Negative for environmental allergies.  Neurological: Negative.  Negative for dizziness, tremors, light-headedness and headaches.    Hematological: Negative.  Negative for adenopathy. Does not bruise/bleed easily.  Psychiatric/Behavioral: Negative.        Objective:   Physical Exam  Constitutional: He is oriented to person, place, and time. He appears well-developed and well-nourished.  Non-toxic appearance. He does not have a sickly appearance. He does not appear ill. No distress.  HENT:  Head: Normocephalic and atraumatic.  Mouth/Throat: Oropharynx is clear and moist. No oropharyngeal exudate.  Eyes: Conjunctivae are normal. Right eye exhibits no discharge. Left eye exhibits no discharge. No scleral icterus.  Neck: Normal range of motion. Neck supple. No JVD present. No tracheal deviation present. No thyromegaly present.  Cardiovascular: Normal rate, regular rhythm, normal heart sounds and intact distal pulses.  Exam reveals no gallop and no friction rub.   No murmur heard. Pulmonary/Chest: Effort normal and breath sounds normal. No accessory muscle usage or stridor. Not tachypneic. No respiratory distress. He has no decreased breath sounds. He has no wheezes. He has no rhonchi. He has no rales. He exhibits no tenderness.  Abdominal: Soft. Bowel sounds are normal. He exhibits no distension and no mass. There is no tenderness. There is no rebound and no guarding.  Musculoskeletal: Normal range of motion. He exhibits no edema and no tenderness.  Lymphadenopathy:    He has no cervical adenopathy.  Neurological: He is oriented to person, place, and time.  Skin: Skin is warm and dry. No rash noted. He is not diaphoretic. No erythema. No pallor.     Psychiatric: He has a normal mood and affect. His behavior is normal. Judgment and thought content normal.     Lab Results  Component Value Date   WBC 5.4 10/21/2012   HGB 12.3* 10/21/2012   HCT 36.5* 10/21/2012   PLT 172  10/21/2012   GLUCOSE 85 11/05/2012   CHOL 223* 10/19/2012   TRIG 258* 10/19/2012   HDL 38* 10/19/2012   LDLDIRECT 101.4 05/05/2012   LDLCALC 133*  10/19/2012   ALT 18 11/05/2012   AST 24 11/05/2012   NA 138 11/05/2012   K 4.1 11/05/2012   CL 99 11/05/2012   CREATININE 1.4 11/05/2012   BUN 16 11/05/2012   CO2 27 11/05/2012   TSH 1.412 10/19/2012   PSA 1.56 11/05/2012   INR 0.91 10/19/2012   HGBA1C 7.2* 05/18/2013   MICROALBUR 1.8 02/11/2013       Assessment & Plan:

## 2013-07-18 ENCOUNTER — Encounter: Payer: Self-pay | Admitting: Internal Medicine

## 2013-07-18 NOTE — Assessment & Plan Note (Signed)
Derm referral.

## 2013-07-18 NOTE — Assessment & Plan Note (Signed)
I will treat the infection with avelox and will control the cough with tussicaps

## 2013-07-18 NOTE — Assessment & Plan Note (Signed)
His BP is not well controlled so I have asked him to add Benicar to his BP med regimen

## 2013-08-10 ENCOUNTER — Ambulatory Visit: Payer: BC Managed Care – PPO | Admitting: Internal Medicine

## 2013-08-19 ENCOUNTER — Ambulatory Visit: Payer: BC Managed Care – PPO | Admitting: Internal Medicine

## 2013-08-23 ENCOUNTER — Other Ambulatory Visit: Payer: Self-pay | Admitting: *Deleted

## 2013-08-23 ENCOUNTER — Telehealth: Payer: Self-pay | Admitting: Internal Medicine

## 2013-08-23 MED ORDER — LINAGLIPTIN 5 MG PO TABS: 5.0000 mg | ORAL_TABLET | Freq: Every day | ORAL | Status: DC

## 2013-08-23 NOTE — Telephone Encounter (Signed)
Pt called with questions regarding rx Hydrocod Polst-Chlorphen Polst 10-8 MG CP12; pt states that the pharmacy and the insurance have conflicting information about how soon the rx can be refilled.  pls contact pt to advise.

## 2013-08-24 NOTE — Telephone Encounter (Signed)
Spoke with pt and advised need to follow up to discuss change in BP medication dose.

## 2013-08-26 ENCOUNTER — Encounter: Payer: Self-pay | Admitting: Internal Medicine

## 2013-08-26 ENCOUNTER — Ambulatory Visit (INDEPENDENT_AMBULATORY_CARE_PROVIDER_SITE_OTHER): Payer: BC Managed Care – PPO | Admitting: Internal Medicine

## 2013-08-26 ENCOUNTER — Ambulatory Visit: Payer: BC Managed Care – PPO | Admitting: Internal Medicine

## 2013-08-26 ENCOUNTER — Other Ambulatory Visit: Payer: BC Managed Care – PPO

## 2013-08-26 ENCOUNTER — Other Ambulatory Visit (INDEPENDENT_AMBULATORY_CARE_PROVIDER_SITE_OTHER): Payer: BC Managed Care – PPO

## 2013-08-26 VITALS — BP 156/98 | HR 67 | Temp 97.2°F | Resp 16 | Ht 68.5 in | Wt 207.0 lb

## 2013-08-26 DIAGNOSIS — I4891 Unspecified atrial fibrillation: Secondary | ICD-10-CM

## 2013-08-26 DIAGNOSIS — I10 Essential (primary) hypertension: Secondary | ICD-10-CM

## 2013-08-26 DIAGNOSIS — E1165 Type 2 diabetes mellitus with hyperglycemia: Secondary | ICD-10-CM

## 2013-08-26 DIAGNOSIS — E785 Hyperlipidemia, unspecified: Secondary | ICD-10-CM

## 2013-08-26 DIAGNOSIS — IMO0001 Reserved for inherently not codable concepts without codable children: Secondary | ICD-10-CM

## 2013-08-26 DIAGNOSIS — Z860100 Personal history of colon polyps, unspecified: Secondary | ICD-10-CM | POA: Insufficient documentation

## 2013-08-26 LAB — HEMOGLOBIN A1C: Hgb A1c MFr Bld: 6.8 % — ABNORMAL HIGH (ref 4.6–6.5)

## 2013-08-26 LAB — LIPID PANEL
CHOL/HDL RATIO: 4
Cholesterol: 158 mg/dL (ref 0–200)
HDL: 38.8 mg/dL — ABNORMAL LOW (ref >39.00)
Triglycerides: 282 mg/dL — ABNORMAL HIGH (ref 0.0–149.0)
VLDL: 56.4 mg/dL — ABNORMAL HIGH (ref 0.0–40.0)

## 2013-08-26 LAB — BASIC METABOLIC PANEL
BUN: 12 mg/dL (ref 6–23)
CO2: 28 meq/L (ref 19–32)
Calcium: 8.5 mg/dL (ref 8.4–10.5)
Chloride: 107 mEq/L (ref 96–112)
Creatinine, Ser: 1.3 mg/dL (ref 0.4–1.5)
GFR: 74.66 mL/min (ref >60.00)
GLUCOSE: 102 mg/dL — AB (ref 70–99)
Potassium: 3.8 mEq/L (ref 3.5–5.1)
Sodium: 140 mEq/L (ref 135–145)

## 2013-08-26 LAB — LDL CHOLESTEROL, DIRECT: LDL DIRECT: 91.2 mg/dL

## 2013-08-26 MED ORDER — HYDROCHLOROTHIAZIDE 25 MG PO TABS: 25.0000 mg | ORAL_TABLET | Freq: Every day | ORAL | Status: DC

## 2013-08-26 NOTE — Patient Instructions (Signed)

## 2013-08-26 NOTE — Progress Notes (Signed)
Pre-visit discussion using our clinic review tool. No additional management support is needed unless otherwise documented below in the visit note.  

## 2013-08-26 NOTE — Progress Notes (Signed)
Subjective:    Patient ID: [name] [name], male    DOB: [birth year], 63 y.o.   MRN: [id number]  Hypertension This is a chronic problem. The current episode started more than 1 year ago. The problem has been gradually improving since onset. The problem is uncontrolled. Pertinent negatives include no anxiety, blurred vision, chest pain, headaches, malaise/fatigue, neck pain, orthopnea, palpitations, peripheral edema, PND, shortness of breath or sweats. There are no associated agents to hypertension. Past treatments include angiotensin blockers, calcium channel blockers, beta blockers and diuretics. The current treatment provides moderate improvement. Compliance problems include psychosocial issues and diet (he has been out of HCTZ for 2 weeks).       Review of Systems  Constitutional: Negative.  Negative for malaise/fatigue.  HENT: Negative.   Eyes: Negative.  Negative for blurred vision.  Respiratory: Negative.  Negative for cough, choking, chest tightness, shortness of breath and stridor.   Cardiovascular: Negative.  Negative for chest pain, palpitations, orthopnea, leg swelling and PND.  Gastrointestinal: Negative.  Negative for nausea, vomiting, abdominal pain, diarrhea and constipation.  Endocrine: Negative.   Genitourinary: Negative.   Musculoskeletal: Negative.  Negative for arthralgias, back pain, myalgias and neck pain.  Skin: Negative.   Allergic/Immunologic: Negative.   Neurological: Negative.  Negative for dizziness, tremors, weakness, light-headedness and headaches.  Hematological: Negative.  Negative for adenopathy. Does not bruise/bleed easily.  Psychiatric/Behavioral: Negative.        Objective:   Physical Exam  Vitals reviewed. Constitutional: He is oriented to person, place, and time. He appears well-developed and well-nourished. No distress.  HENT:  Head: Normocephalic and atraumatic.  Mouth/Throat: Oropharynx is clear and moist. No oropharyngeal exudate.  Eyes:  Conjunctivae are normal. Right eye exhibits no discharge. Left eye exhibits no discharge. No scleral icterus.  Neck: Normal range of motion. Neck supple. No JVD present. No tracheal deviation present. No thyromegaly present.  Cardiovascular: Normal rate, regular rhythm, normal heart sounds and intact distal pulses.  Exam reveals no gallop and no friction rub.   No murmur heard. Pulmonary/Chest: Effort normal and breath sounds normal. No stridor. No respiratory distress. He has no wheezes. He has no rales. He exhibits no tenderness.  Abdominal: Soft. Bowel sounds are normal. He exhibits no distension and no mass. There is no tenderness. There is no rebound and no guarding.  Musculoskeletal: Normal range of motion. He exhibits no edema and no tenderness.  Lymphadenopathy:    He has no cervical adenopathy.  Neurological: He is oriented to person, place, and time.  Skin: Skin is warm and dry. No rash noted. He is not diaphoretic. No erythema. No pallor.  Psychiatric: He has a normal mood and affect. His behavior is normal. Judgment and thought content normal.     Lab Results  Component Value Date   WBC 5.4 10/21/2012   HGB 12.3* 10/21/2012   HCT 36.5* 10/21/2012   PLT 172 10/21/2012   GLUCOSE 85 11/05/2012   CHOL 223* 10/19/2012   TRIG 258* 10/19/2012   HDL 38* 10/19/2012   LDLDIRECT 101.4 05/05/2012   LDLCALC 133* 10/19/2012   ALT 18 11/05/2012   AST 24 11/05/2012   NA 138 11/05/2012   K 4.1 11/05/2012   CL 99 11/05/2012   CREATININE 1.4 11/05/2012   BUN 16 11/05/2012   CO2 27 11/05/2012   TSH 1.412 10/19/2012   PSA 1.56 11/05/2012   INR 0.91 10/19/2012   HGBA1C 7.2* 05/18/2013   MICROALBUR 1.8 02/11/2013  Assessment & Plan:

## 2013-08-27 ENCOUNTER — Other Ambulatory Visit: Payer: Self-pay | Admitting: *Deleted

## 2013-08-27 ENCOUNTER — Encounter: Payer: Self-pay | Admitting: Internal Medicine

## 2013-08-27 ENCOUNTER — Other Ambulatory Visit: Payer: Self-pay | Admitting: Internal Medicine

## 2013-08-27 MED ORDER — GLUCOSE BLOOD VI STRP: ORAL_STRIP | Status: DC

## 2013-08-27 NOTE — Assessment & Plan Note (Signed)
His blood sugars are well controlled and his renal function is stable

## 2013-08-27 NOTE — Telephone Encounter (Signed)
Resending rx refill with dx code.

## 2013-08-27 NOTE — Assessment & Plan Note (Signed)
He has good rate and rhythm control He is not willing to take an anticoagulant

## 2013-08-27 NOTE — Assessment & Plan Note (Signed)
His BP is not well controlled so I have asked him to restart the HCTZ, he will cont the other meds as usual

## 2013-09-01 ENCOUNTER — Ambulatory Visit: Payer: BC Managed Care – PPO | Admitting: Internal Medicine

## 2013-09-01 DIAGNOSIS — Z0289 Encounter for other administrative examinations: Secondary | ICD-10-CM

## 2013-09-03 ENCOUNTER — Encounter: Payer: Self-pay | Admitting: Internal Medicine

## 2013-09-15 ENCOUNTER — Other Ambulatory Visit: Payer: Self-pay | Admitting: *Deleted

## 2013-09-15 MED ORDER — INSULIN DETEMIR 100 UNIT/ML ~~LOC~~ SOLN: 20.0000 [IU] | Freq: Every day | SUBCUTANEOUS | Status: DC

## 2013-10-04 ENCOUNTER — Other Ambulatory Visit: Payer: Self-pay

## 2013-10-04 MED ORDER — METFORMIN HCL 500 MG PO TABS: 500.0000 mg | ORAL_TABLET | Freq: Two times a day (BID) | ORAL | Status: DC

## 2013-10-04 MED ORDER — VITAMIN D (ERGOCALCIFEROL) 1.25 MG (50000 UNIT) PO CAPS: 50000.0000 [IU] | ORAL_CAPSULE | ORAL | Status: DC

## 2013-10-05 ENCOUNTER — Other Ambulatory Visit: Payer: Self-pay | Admitting: *Deleted

## 2013-10-05 MED ORDER — METFORMIN HCL 500 MG PO TABS: 500.0000 mg | ORAL_TABLET | Freq: Two times a day (BID) | ORAL | Status: DC

## 2013-11-02 ENCOUNTER — Ambulatory Visit (AMBULATORY_SURGERY_CENTER): Payer: BC Managed Care – PPO | Admitting: *Deleted

## 2013-11-02 VITALS — Ht 70.0 in | Wt 208.8 lb

## 2013-11-02 DIAGNOSIS — Z86010 Personal history of colonic polyps: Secondary | ICD-10-CM

## 2013-11-02 MED ORDER — NA SULFATE-K SULFATE-MG SULF 17.5-3.13-1.6 GM/177ML PO SOLN: 1.0000 | Freq: Once | ORAL | Status: DC

## 2013-11-02 NOTE — Telephone Encounter (Signed)
Error

## 2013-11-02 NOTE — Progress Notes (Signed)
Denies allergies to eggs or soy products. Denies complications with sedation or anesthesia. 

## 2013-11-03 ENCOUNTER — Encounter: Payer: Self-pay | Admitting: Internal Medicine

## 2013-11-11 ENCOUNTER — Telehealth: Payer: Self-pay | Admitting: Internal Medicine

## 2013-11-11 NOTE — Telephone Encounter (Signed)
Rec'd records from Dr Caroll Rancher.,Forwarding 24 pages to [employer]

## 2013-11-11 NOTE — Telephone Encounter (Signed)
Received 24 pages from Dr. Pietro Cassis Karimi's office, sent to Dr. Carlean Purl. 11/11/13/ss.

## 2013-11-12 ENCOUNTER — Telehealth: Payer: Self-pay | Admitting: Internal Medicine

## 2013-11-12 NOTE — Telephone Encounter (Signed)
No charge. 

## 2013-11-14 ENCOUNTER — Other Ambulatory Visit: Payer: Self-pay | Admitting: Internal Medicine

## 2013-11-16 ENCOUNTER — Encounter: Payer: BC Managed Care – PPO | Admitting: Internal Medicine

## 2013-12-07 ENCOUNTER — Telehealth: Payer: Self-pay | Admitting: Internal Medicine

## 2013-12-07 NOTE — Telephone Encounter (Signed)
Rec'd from [place] forward 11 pages to Dr.Gessner

## 2013-12-28 ENCOUNTER — Telehealth: Payer: Self-pay | Admitting: *Deleted

## 2013-12-28 DIAGNOSIS — IMO0001 Reserved for inherently not codable concepts without codable children: Secondary | ICD-10-CM

## 2013-12-28 DIAGNOSIS — E1165 Type 2 diabetes mellitus with hyperglycemia: Secondary | ICD-10-CM

## 2013-12-28 DIAGNOSIS — I10 Essential (primary) hypertension: Secondary | ICD-10-CM

## 2013-12-28 MED ORDER — LOSARTAN POTASSIUM 100 MG PO TABS: 100.0000 mg | ORAL_TABLET | Freq: Every day | ORAL | Status: DC

## 2013-12-28 NOTE — Telephone Encounter (Signed)
done

## 2013-12-28 NOTE — Telephone Encounter (Signed)
Pt called requesting Losartan Rx.  Medication not on med list.  Please advise

## 2013-12-28 NOTE — Telephone Encounter (Signed)
He is already on benicar so this would be a duplication

## 2013-12-28 NOTE — Telephone Encounter (Signed)
Spoke with pt, he states Benicar causes dizziness.  He prefers Losartan instead.  Please advise

## 2013-12-29 NOTE — Telephone Encounter (Signed)
Spoke with pt advised Rx sent 

## 2014-01-18 ENCOUNTER — Encounter: Payer: BC Managed Care – PPO | Admitting: Internal Medicine

## 2014-01-24 ENCOUNTER — Telehealth: Payer: Self-pay | Admitting: Internal Medicine

## 2014-01-24 ENCOUNTER — Telehealth: Payer: Self-pay | Admitting: *Deleted

## 2014-01-24 DIAGNOSIS — IMO0001 Reserved for inherently not codable concepts without codable children: Secondary | ICD-10-CM

## 2014-01-24 DIAGNOSIS — E1165 Type 2 diabetes mellitus with hyperglycemia: Secondary | ICD-10-CM

## 2014-01-24 DIAGNOSIS — I10 Essential (primary) hypertension: Secondary | ICD-10-CM

## 2014-01-24 MED ORDER — OMEPRAZOLE 20 MG PO CPDR: DELAYED_RELEASE_CAPSULE | ORAL | Status: DC

## 2014-01-24 MED ORDER — METOPROLOL SUCCINATE ER 50 MG PO TB24: ORAL_TABLET | ORAL | Status: DC

## 2014-01-24 MED ORDER — LOSARTAN POTASSIUM 100 MG PO TABS: 100.0000 mg | ORAL_TABLET | Freq: Every day | ORAL | Status: DC

## 2014-01-24 MED ORDER — HYDROCHLOROTHIAZIDE 25 MG PO TABS: 25.0000 mg | ORAL_TABLET | Freq: Every day | ORAL | Status: DC

## 2014-01-24 NOTE — Telephone Encounter (Signed)
I need to see him first. I did not see him in 8 months.

## 2014-01-24 NOTE — Telephone Encounter (Signed)
Pt's insurance is changing to Medicare on Wednesday and the pharmacy needs [place] to call in new RX for all his meds so they can register them with the new insurance please remember to include the test strips

## 2014-01-24 NOTE — Telephone Encounter (Signed)
Please read note below and advise.  

## 2014-01-24 NOTE — Telephone Encounter (Signed)
Called pt and advised her that Dr Cruzita Lederer would need to see him first, he has not been seen by her in 8 months. Pt states he has an appt on 7/7. He will see her then. Be advised.

## 2014-01-24 NOTE — Telephone Encounter (Signed)
Left msg on triage stating that July 1st he will be getting medicare. Needing prescription sent to [facility]. Called pt back no answer LMOM meds sent...Johny Chess

## 2014-02-01 ENCOUNTER — Encounter: Payer: Self-pay | Admitting: Internal Medicine

## 2014-02-01 ENCOUNTER — Ambulatory Visit (INDEPENDENT_AMBULATORY_CARE_PROVIDER_SITE_OTHER): Payer: Medicare Other | Admitting: Internal Medicine

## 2014-02-01 VITALS — BP 154/94 | HR 62 | Temp 98.0°F | Resp 16 | Ht 68.5 in | Wt 212.0 lb

## 2014-02-01 DIAGNOSIS — E1165 Type 2 diabetes mellitus with hyperglycemia: Principal | ICD-10-CM

## 2014-02-01 DIAGNOSIS — IMO0001 Reserved for inherently not codable concepts without codable children: Secondary | ICD-10-CM | POA: Diagnosis not present

## 2014-02-01 LAB — HEMOGLOBIN A1C: Hgb A1c MFr Bld: 7.3 % — ABNORMAL HIGH (ref 4.6–6.5)

## 2014-02-01 MED ORDER — METFORMIN HCL 500 MG PO TABS: 500.0000 mg | ORAL_TABLET | Freq: Two times a day (BID) | ORAL | Status: DC

## 2014-02-01 MED ORDER — INSULIN DETEMIR 100 UNIT/ML ~~LOC~~ SOLN: 15.0000 [IU] | Freq: Every day | SUBCUTANEOUS | Status: DC

## 2014-02-01 MED ORDER — GLUCOSE BLOOD VI STRP: ORAL_STRIP | Status: DC

## 2014-02-01 MED ORDER — LINAGLIPTIN 5 MG PO TABS: 5.0000 mg | ORAL_TABLET | Freq: Every day | ORAL | Status: DC

## 2014-02-01 MED ORDER — "INSULIN SYRINGE 31G X 5/16"" 0.3 ML MISC": 1.0000 | Freq: Every day | Status: DC

## 2014-02-01 MED ORDER — INSULIN ASPART 100 UNIT/ML ~~LOC~~ SOLN: 1.0000 [IU] | Freq: Three times a day (TID) | SUBCUTANEOUS | Status: DC

## 2014-02-01 NOTE — Patient Instructions (Signed)
Please continue: - Metformin 500 mg 2x a day with meals   - Levemir 15 units at bedtime - Novolog sliding scale, target 140 Please add back Tradjenta 5 mg in am daily.  Please return in 3 months with your sugar log.   Please stop at the lab.

## 2014-02-01 NOTE — Progress Notes (Signed)
Patient ID: [name] [name], male   DOB: Apr 02, 1951, 63 y.o.   MRN: [id number]  HPI: [name] [name] is a 63 y.o.-year-old male, returning for f/u for DM2, dx in 06/2012, insulin-dependent since 09/2012, uncontrolled, with complications (CKD stage 3). Last visit 8.5 mo ago.  Last hemoglobin A1c was: Lab Results  Component Value Date   HGBA1C 6.8* 08/26/2013   HGBA1C 7.2* 05/18/2013   HGBA1C 13.7* 10/17/2012   Pt is on a regimen of: - Metformin 500 mg po bid with meals - ran out 3 days ago - Levemir 15 units qhs - Novolog SSI - target 140, adds 2 units for approx 30 mg/dL He had to stop Tradjenta 5 mg daily 3 weeks ago as his [employer] did not cover it. He heard about Victoza, but it is not very eager to try newer medicines.  He brings his log with him, which we reviewed together. He checks sugars 3x a day and they are: - am: 100-157, but ave. 127-137 >> 101-120 >> 101-123 >> 90-130 - 2h after b'fast: 105-168 >> n/c >> <150 - before lunch: 117-121 >> 90-131, but one high at 183 >> 97-147 >> 90-150 - before dinner: (snacks in pm, mainly chocolate): 120-130s >> 122-198 >> 101-173 >> 117-161 - 2h after dinner: 122-131 >> n/c >> 160-170 - bedtime: 119-164 >> not checking No lows. Lowest sugar was 97; does not know if has hypoglycemia awareness. Highest sugar was 191. He took 8 units for this.  Pt has chronic kidney disease, last BUN/creatinine was:  Lab Results  Component Value Date   BUN 12 08/26/2013   CREATININE 1.3 08/26/2013  Last ACR 0.6 12/2012. Last set of lipids: Lab Results  Component Value Date   CHOL 158 08/26/2013   HDL 38.80* 08/26/2013   LDLCALC 133* 10/19/2012   LDLDIRECT 91.2 08/26/2013   TRIG 282.0* 08/26/2013   CHOLHDL 4 08/26/2013   Pt's last eye exam was "long time ago". Will have an appt with dr [name] [name] soon. Denies numbness and tingling in his legs.  Denies increased thirst and urination or any blurry vision.  He also has a history of nonischemic cardiomyopathy,  with the last 2-D echo showing an EF of 52-84%, grade 1 diastolic dysfunction, severe LV H, normal LV size and mildly dilated LA. He also has hyperlipidemia-on Crestor - but not compliant with it ("I take too many medications"), history of atrial fibrillation, COPD and GERD. No h/o pancreatitis.   I reviewed pt's medications, allergies, PMH, social hx, family hx and no changes required, except as mentioned above.   ROS: Constitutional: + weight gain, no fatigue, no subjective hyperthermia/hypothermia Eyes: no blurry vision, no xerophthalmia ENT: no sore throat, no nodules palpated in throat, no dysphagia/odynophagia, no hoarseness Cardiovascular: no CP/SOB/palpitations/leg swelling Respiratory: no cough/SOB, no wheezing Gastrointestinal: no N/V/D/C, no heartburn Musculoskeletal: no muscle/ joint aches Skin: no rashes Neurological: no tremors/numbness/tingling/dizziness  PE: BP 154/94  Pulse 62  Temp(Src) 98 F (36.7 C)  Resp 16  Ht 5' 8.5" (1.74 m)  Wt 212 lb (96.163 kg)  BMI 31.76 kg/m2  SpO2 95% Wt Readings from Last 3 Encounters:  02/01/14 212 lb (96.163 kg)  11/02/13 208 lb 12.8 oz (94.711 kg)  08/26/13 207 lb (93.895 kg)   Constitutional: overweight, in NAD Eyes: PERRLA, EOMI, no exophthalmos ENT: moist mucous membranes, no thyromegaly, no cervical lymphadenopathy Cardiovascular: RRR, No MRG Respiratory: CTA B Gastrointestinal: abdomen soft, NT, ND, BS+ Musculoskeletal: no deformities, strength intact in all 4 Skin: moist, warm,  no rashes Neurological: no tremor with outstretched hands, DTR normal in all 4  ASSESSMENT: 1. DM2, insulin-dependent, uncontrolled, with complications - CKD stage III  PLAN:  1. Patient with improved diabetes control after addition of insulin and Tradjenta. He unfortunately had to stop [name] [name] when he changed insurance, but ready to start it back. - I advised him to: Patient Instructions  Please continue: - Metformin 500 mg 2x a day  with meals   - Levemir 15 units at bedtime - Novolog sliding scale, target 140 Please add back Tradjenta 5 mg in am daily. Please return in 3 months with your sugar log.  Please stop at the lab. - will check a hemoglobin A1c today.  - refilled all his meds - Will have an appointment with the ophthalmologist soon - I will  see him back in 3 months with his sugar log - continue to check sugars 2-3 times a day  Office Visit on 02/01/2014  Component Date Value Ref Range Status  . Hemoglobin A1C 02/01/2014 7.3* 4.6 - 6.5 % Final   Glycemic Control Guidelines for People with Diabetes:Non Diabetic:  <6%Goal of Therapy: <7%Additional Action Suggested:  >8%    HbA1c higher, but will start [name] [name] back as we discussed.

## 2014-02-21 ENCOUNTER — Other Ambulatory Visit: Payer: Self-pay | Admitting: Internal Medicine

## 2014-02-21 NOTE — Telephone Encounter (Signed)
Ok to refill Vit D rx?

## 2014-03-15 ENCOUNTER — Encounter: Payer: Self-pay | Admitting: Internal Medicine

## 2014-03-15 ENCOUNTER — Other Ambulatory Visit (INDEPENDENT_AMBULATORY_CARE_PROVIDER_SITE_OTHER): Payer: Medicare Other

## 2014-03-15 ENCOUNTER — Ambulatory Visit (INDEPENDENT_AMBULATORY_CARE_PROVIDER_SITE_OTHER): Payer: Medicare Other | Admitting: Internal Medicine

## 2014-03-15 VITALS — BP 154/90 | HR 57 | Temp 98.1°F | Resp 16 | Ht 68.5 in | Wt 204.4 lb

## 2014-03-15 DIAGNOSIS — IMO0001 Reserved for inherently not codable concepts without codable children: Secondary | ICD-10-CM | POA: Diagnosis not present

## 2014-03-15 DIAGNOSIS — Z0000 Encounter for general adult medical examination without abnormal findings: Secondary | ICD-10-CM

## 2014-03-15 DIAGNOSIS — I10 Essential (primary) hypertension: Secondary | ICD-10-CM

## 2014-03-15 DIAGNOSIS — E1165 Type 2 diabetes mellitus with hyperglycemia: Secondary | ICD-10-CM

## 2014-03-15 DIAGNOSIS — N400 Enlarged prostate without lower urinary tract symptoms: Secondary | ICD-10-CM | POA: Diagnosis not present

## 2014-03-15 DIAGNOSIS — I428 Other cardiomyopathies: Secondary | ICD-10-CM

## 2014-03-15 DIAGNOSIS — E785 Hyperlipidemia, unspecified: Secondary | ICD-10-CM | POA: Diagnosis not present

## 2014-03-15 DIAGNOSIS — Z23 Encounter for immunization: Secondary | ICD-10-CM

## 2014-03-15 DIAGNOSIS — R351 Nocturia: Secondary | ICD-10-CM

## 2014-03-15 DIAGNOSIS — I429 Cardiomyopathy, unspecified: Secondary | ICD-10-CM

## 2014-03-15 DIAGNOSIS — N401 Enlarged prostate with lower urinary tract symptoms: Secondary | ICD-10-CM | POA: Insufficient documentation

## 2014-03-15 LAB — CBC WITH DIFFERENTIAL/PLATELET
BASOS ABS: 0 10*3/uL (ref 0.0–0.1)
Basophils Relative: 0.4 % (ref 0.0–3.0)
Eosinophils Absolute: 0.4 10*3/uL (ref 0.0–0.7)
Eosinophils Relative: 4 % (ref 0.0–5.0)
HEMATOCRIT: 41.6 % (ref 39.0–52.0)
HEMOGLOBIN: 14 g/dL (ref 13.0–17.0)
LYMPHS ABS: 2.9 10*3/uL (ref 0.7–4.0)
Lymphocytes Relative: 30.6 % (ref 12.0–46.0)
MCHC: 33.5 g/dL (ref 30.0–36.0)
MCV: 94.8 fl (ref 78.0–100.0)
Monocytes Absolute: 0.7 10*3/uL (ref 0.1–1.0)
Monocytes Relative: 7.1 % (ref 3.0–12.0)
NEUTROS ABS: 5.4 10*3/uL (ref 1.4–7.7)
Neutrophils Relative %: 57.9 % (ref 43.0–77.0)
Platelets: 292 10*3/uL (ref 150.0–400.0)
RBC: 4.39 Mil/uL (ref 4.22–5.81)
RDW: 13.7 % (ref 11.5–15.5)
WBC: 9.4 10*3/uL (ref 4.0–10.5)

## 2014-03-15 LAB — LIPID PANEL
CHOL/HDL RATIO: 5
Cholesterol: 197 mg/dL (ref 0–200)
HDL: 39 mg/dL — AB (ref >39.00)
NONHDL: 158
Triglycerides: 239 mg/dL — ABNORMAL HIGH (ref 0.0–149.0)
VLDL: 47.8 mg/dL — ABNORMAL HIGH (ref 0.0–40.0)

## 2014-03-15 LAB — COMPREHENSIVE METABOLIC PANEL
ALT: 15 U/L (ref 0–53)
AST: 24 U/L (ref 0–37)
Albumin: 3.9 g/dL (ref 3.5–5.2)
Alkaline Phosphatase: 77 U/L (ref 39–117)
BILIRUBIN TOTAL: 1.3 mg/dL — AB (ref 0.2–1.2)
BUN: 17 mg/dL (ref 6–23)
CO2: 32 mEq/L (ref 19–32)
Calcium: 9.4 mg/dL (ref 8.4–10.5)
Chloride: 101 mEq/L (ref 96–112)
Creatinine, Ser: 1.3 mg/dL (ref 0.4–1.5)
GFR: 70.6 mL/min (ref >60.00)
Glucose, Bld: 68 mg/dL — ABNORMAL LOW (ref 70–99)
Potassium: 3.8 mEq/L (ref 3.5–5.1)
Sodium: 141 mEq/L (ref 135–145)
Total Protein: 7.9 g/dL (ref 6.0–8.3)

## 2014-03-15 LAB — TSH: TSH: 1.85 u[IU]/mL (ref 0.35–4.50)

## 2014-03-15 LAB — LDL CHOLESTEROL, DIRECT: LDL DIRECT: 128.1 mg/dL

## 2014-03-15 LAB — PSA: PSA: 1.28 ng/mL (ref 0.10–4.00)

## 2014-03-15 NOTE — Assessment & Plan Note (Addendum)

## 2014-03-15 NOTE — Progress Notes (Signed)
Subjective:    Patient ID: [name] [name], male    DOB: 11/14/1950, 63 y.o.   MRN: [id number]  Hypertension This is a chronic problem. The current episode started more than 1 year ago. The problem has been gradually improving since onset. The problem is controlled. Pertinent negatives include no anxiety, blurred vision, chest pain, headaches, malaise/fatigue, neck pain, orthopnea, palpitations, peripheral edema, PND, shortness of breath or sweats. There are no associated agents to hypertension. Past treatments include beta blockers, calcium channel blockers, angiotensin blockers and diuretics. The current treatment provides moderate improvement. Compliance problems include exercise and diet.  Hypertensive end-organ damage includes heart failure.      Review of Systems  Constitutional: Negative.  Negative for fever, chills, malaise/fatigue, diaphoresis, appetite change and fatigue.  HENT: Negative.   Eyes: Negative.  Negative for blurred vision.  Respiratory: Negative.  Negative for cough, choking, chest tightness, shortness of breath, wheezing and stridor.   Cardiovascular: Negative.  Negative for chest pain, palpitations, orthopnea, leg swelling and PND.  Gastrointestinal: Negative.  Negative for vomiting, abdominal pain, diarrhea, constipation and blood in stool.  Endocrine: Negative.  Negative for polydipsia, polyphagia and polyuria.  Genitourinary: Negative.  Negative for urgency, frequency, decreased urine volume, discharge, penile swelling, difficulty urinating, penile pain and testicular pain.  Musculoskeletal: Negative.  Negative for arthralgias, back pain, myalgias and neck pain.  Skin: Negative.   Allergic/Immunologic: Negative.   Neurological: Negative.  Negative for dizziness, tremors, seizures, syncope, facial asymmetry, speech difficulty, weakness, light-headedness, numbness and headaches.  Hematological: Negative.  Negative for adenopathy. Does not bruise/bleed easily.    Psychiatric/Behavioral: Negative.        Objective:   Physical Exam  Vitals reviewed. Constitutional: He is oriented to person, place, and time. He appears well-developed and well-nourished. No distress.  HENT:  Head: Normocephalic and atraumatic.  Mouth/Throat: Oropharynx is clear and moist. No oropharyngeal exudate.  Eyes: Conjunctivae are normal. Right eye exhibits no discharge. Left eye exhibits no discharge. No scleral icterus.  Neck: Normal range of motion. Neck supple. No JVD present. No tracheal deviation present. No thyromegaly present.  Cardiovascular: Normal rate, regular rhythm, normal heart sounds and intact distal pulses.  Exam reveals no gallop and no friction rub.   No murmur heard. Pulmonary/Chest: Effort normal and breath sounds normal. No stridor. No respiratory distress. He has no wheezes. He has no rales. He exhibits no tenderness.  Abdominal: Soft. Bowel sounds are normal. He exhibits no distension and no mass. There is no tenderness. There is no rebound and no guarding. Hernia confirmed negative in the right inguinal area and confirmed negative in the left inguinal area.  Genitourinary: Rectum normal, prostate normal, testes normal and penis normal. Rectal exam shows no external hemorrhoid, no internal hemorrhoid, no fissure, no mass, no tenderness and anal tone normal. Guaiac negative stool. Prostate is not enlarged and not tender. Right testis shows no mass, no swelling and no tenderness. Right testis is descended. Left testis shows no mass, no swelling and no tenderness. Left testis is descended. Uncircumcised. No phimosis, paraphimosis, hypospadias, penile erythema or penile tenderness. No discharge found.  Musculoskeletal: Normal range of motion. He exhibits no edema and no tenderness.  Lymphadenopathy:    He has no cervical adenopathy.       Right: No inguinal adenopathy present.       Left: No inguinal adenopathy present.  Neurological: He is oriented to person,  place, and time.  Skin: Skin is warm and dry. No rash noted.  He is not diaphoretic. No erythema. No pallor.  Psychiatric: He has a normal mood and affect. His behavior is normal. Judgment and thought content normal.     Lab Results  Component Value Date   WBC 5.4 10/21/2012   HGB 12.3* 10/21/2012   HCT 36.5* 10/21/2012   PLT 172 10/21/2012   GLUCOSE 102* 08/26/2013   CHOL 158 08/26/2013   TRIG 282.0* 08/26/2013   HDL 38.80* 08/26/2013   LDLDIRECT 91.2 08/26/2013   LDLCALC 133* 10/19/2012   ALT 18 11/05/2012   AST 24 11/05/2012   NA 140 08/26/2013   K 3.8 08/26/2013   CL 107 08/26/2013   CREATININE 1.3 08/26/2013   BUN 12 08/26/2013   CO2 28 08/26/2013   TSH 1.412 10/19/2012   PSA 1.56 11/05/2012   INR 0.91 10/19/2012   HGBA1C 7.3* 02/01/2014   MICROALBUR 1.8 02/11/2013       Assessment & Plan:

## 2014-03-15 NOTE — Assessment & Plan Note (Signed)
He has no s/s related to this

## 2014-03-15 NOTE — Patient Instructions (Signed)
Health Maintenance A healthy lifestyle and preventative care can promote health and wellness.  Maintain regular health, dental, and eye exams.  Eat a healthy diet. Foods like vegetables, fruits, whole grains, low-fat dairy products, and lean protein foods contain the nutrients you need and are low in calories. Decrease your intake of foods high in solid fats, added sugars, and salt. Get information about a proper diet from your health care provider, if necessary.  Regular physical exercise is one of the most important things you can do for your health. Most adults should get at least 150 minutes of moderate-intensity exercise (any activity that increases your heart rate and causes you to sweat) each week. In addition, most adults need muscle-strengthening exercises on 2 or more days a week.   Maintain a healthy weight. The body mass index (BMI) is a screening tool to identify possible weight problems. It provides an estimate of body fat based on height and weight. Your health care provider can find your BMI and can help you achieve or maintain a healthy weight. For males 20 years and older:  A BMI below 18.5 is considered underweight.  A BMI of 18.5 to 24.9 is normal.  A BMI of 25 to 29.9 is considered overweight.  A BMI of 30 and above is considered obese.  Maintain normal blood lipids and cholesterol by exercising and minimizing your intake of saturated fat. Eat a balanced diet with plenty of fruits and vegetables. Blood tests for lipids and cholesterol should begin at age 20 and be repeated every 5 years. If your lipid or cholesterol levels are high, you are over age 50, or you are at high risk for heart disease, you may need your cholesterol levels checked more frequently.Ongoing high lipid and cholesterol levels should be treated with medicines if diet and exercise are not working.  If you smoke, find out from your health care provider how to quit. If you do not use tobacco, do not  start.  Lung cancer screening is recommended for adults aged 55-80 years who are at high risk for developing lung cancer because of a history of smoking. A yearly low-dose CT scan of the lungs is recommended for people who have at least a 30-pack-year history of smoking and are current smokers or have quit within the past 15 years. A pack year of smoking is smoking an average of 1 pack of cigarettes a day for 1 year (for example, a 30-pack-year history of smoking could mean smoking 1 pack a day for 30 years or 2 packs a day for 15 years). Yearly screening should continue until the smoker has stopped smoking for at least 15 years. Yearly screening should be stopped for people who develop a health problem that would prevent them from having lung cancer treatment.  If you choose to drink alcohol, do not have more than 2 drinks per day. One drink is considered to be 12 oz (360 mL) of beer, 5 oz (150 mL) of wine, or 1.5 oz (45 mL) of liquor.  Avoid the use of street drugs. Do not share needles with anyone. Ask for help if you need support or instructions about stopping the use of drugs.  High blood pressure causes heart disease and increases the risk of stroke. Blood pressure should be checked at least every 1-2 years. Ongoing high blood pressure should be treated with medicines if weight loss and exercise are not effective.  If you are 45-79 years old, ask your health care provider if   you should take aspirin to prevent heart disease.  Diabetes screening involves taking a blood sample to check your fasting blood sugar level. This should be done once every 3 years after age 45 if you are at a normal weight and without risk factors for diabetes. Testing should be considered at a younger age or be carried out more frequently if you are overweight and have at least 1 risk factor for diabetes.  Colorectal cancer can be detected and often prevented. Most routine colorectal cancer screening begins at the age of 50  and continues through age 75. However, your health care provider may recommend screening at an earlier age if you have risk factors for colon cancer. On a yearly basis, your health care provider may provide home test kits to check for hidden blood in the stool. A small camera at the end of a tube may be used to directly examine the colon (sigmoidoscopy or colonoscopy) to detect the earliest forms of colorectal cancer. Talk to your health care provider about this at age 50 when routine screening begins. A direct exam of the colon should be repeated every 5-10 years through age 75, unless early forms of precancerous polyps or small growths are found.  People who are at an increased risk for hepatitis B should be screened for this virus. You are considered at high risk for hepatitis B if:  You were born in a country where hepatitis B occurs often. Talk with your health care provider about which countries are considered high risk.  Your parents were born in a high-risk country and you have not received a shot to protect against hepatitis B (hepatitis B vaccine).  You have HIV or AIDS.  You use needles to inject street drugs.  You live with, or have sex with, someone who has hepatitis B.  You are a man who has sex with other men (MSM).  You get hemodialysis treatment.  You take certain medicines for conditions like cancer, organ transplantation, and autoimmune conditions.  Hepatitis C blood testing is recommended for all people born from 1945 through 1965 and any individual with known risk factors for hepatitis C.  Healthy men should no longer receive prostate-specific antigen (PSA) blood tests as part of routine cancer screening. Talk to your health care provider about prostate cancer screening.  Testicular cancer screening is not recommended for adolescents or adult males who have no symptoms. Screening includes self-exam, a health care provider exam, and other screening tests. Consult with your  health care provider about any symptoms you have or any concerns you have about testicular cancer.  Practice safe sex. Use condoms and avoid high-risk sexual practices to reduce the spread of sexually transmitted infections (STIs).  You should be screened for STIs, including gonorrhea and chlamydia if:  You are sexually active and are younger than 24 years.  You are older than 24 years, and your health care provider tells you that you are at risk for this type of infection.  Your sexual activity has changed since you were last screened, and you are at an increased risk for chlamydia or gonorrhea. Ask your health care provider if you are at risk.  If you are at risk of being infected with HIV, it is recommended that you take a prescription medicine daily to prevent HIV infection. This is called pre-exposure prophylaxis (PrEP). You are considered at risk if:  You are a man who has sex with other men (MSM).  You are a heterosexual man who   is sexually active with multiple partners.  You take drugs by injection.  You are sexually active with a partner who has HIV.  Talk with your health care provider about whether you are at high risk of being infected with HIV. If you choose to begin PrEP, you should first be tested for HIV. You should then be tested every 3 months for as long as you are taking PrEP.  Use sunscreen. Apply sunscreen liberally and repeatedly throughout the day. You should seek shade when your shadow is shorter than you. Protect yourself by wearing long sleeves, pants, a wide-brimmed hat, and sunglasses year round whenever you are outdoors.  Tell your health care provider of new moles or changes in moles, especially if there is a change in shape or color. Also, tell your health care provider if a mole is larger than the size of a pencil eraser.  A one-time screening for abdominal aortic aneurysm (AAA) and surgical repair of large AAAs by ultrasound is recommended for men aged  65-75 years who are current or former smokers.  Stay current with your vaccines (immunizations). Document Released: 01/11/2008 Document Revised: 07/20/2013 Document Reviewed: 12/10/2010 Exit[employer] Patient Information 2015 [employer]. This information is not intended to replace advice given to you by your health care provider. Make sure you discuss any questions you have with your health care provider.  Hypertension Hypertension, commonly called high blood pressure, is when the force of blood pumping through your arteries is too strong. Your arteries are the blood vessels that carry blood from your heart throughout your body. A blood pressure reading consists of a higher number over a lower number, such as 110/72. The higher number (systolic) is the pressure inside your arteries when your heart pumps. The lower number (diastolic) is the pressure inside your arteries when your heart relaxes. Ideally you want your blood pressure below 120/80. Hypertension forces your heart to work harder to pump blood. Your arteries may become narrow or stiff. Having hypertension puts you at risk for heart disease, stroke, and other problems.  RISK FACTORS Some risk factors for high blood pressure are controllable. Others are not.  Risk factors you cannot control include:   Race. You may be at higher risk if you are African American.  Age. Risk increases with age.  Gender. Men are at higher risk than women before age 45 years. After age 65, women are at higher risk than men. Risk factors you can control include:  Not getting enough exercise or physical activity.  Being overweight.  Getting too much fat, sugar, calories, or salt in your diet.  Drinking too much alcohol. SIGNS AND SYMPTOMS Hypertension does not usually cause signs or symptoms. Extremely high blood pressure (hypertensive crisis) may cause headache, anxiety, shortness of breath, and nosebleed. DIAGNOSIS  To check if you have hypertension,  your health care provider will measure your blood pressure while you are seated, with your arm held at the level of your heart. It should be measured at least twice using the same arm. Certain conditions can cause a difference in blood pressure between your right and left arms. A blood pressure reading that is higher than normal on one occasion does not mean that you need treatment. If one blood pressure reading is high, ask your health care provider about having it checked again. TREATMENT  Treating high blood pressure includes making lifestyle changes and possibly taking medicine. Living a healthy lifestyle can help lower high blood pressure. You may need to change   some of your habits. Lifestyle changes may include:  Following the DASH diet. This diet is high in fruits, vegetables, and whole grains. It is low in salt, red meat, and added sugars.  Getting at least 2 hours of brisk physical activity every week.  Losing weight if necessary.  Not smoking.  Limiting alcoholic beverages.  Learning ways to reduce stress. If lifestyle changes are not enough to get your blood pressure under control, your health care provider may prescribe medicine. You may need to take more than one. Work closely with your health care provider to understand the risks and benefits. HOME CARE INSTRUCTIONS  Have your blood pressure rechecked as directed by your health care provider.   Take medicines only as directed by your health care provider. Follow the directions carefully. Blood pressure medicines must be taken as prescribed. The medicine does not work as well when you skip doses. Skipping doses also puts you at risk for problems.   Do not smoke.   Monitor your blood pressure at home as directed by your health care provider. SEEK MEDICAL CARE IF:   You think you are having a reaction to medicines taken.  You have recurrent headaches or feel dizzy.  You have swelling in your ankles.  You have  trouble with your vision. SEEK IMMEDIATE MEDICAL CARE IF:  You develop a severe headache or confusion.  You have unusual weakness, numbness, or feel faint.  You have severe chest or abdominal pain.  You vomit repeatedly.  You have trouble breathing. MAKE SURE YOU:   Understand these instructions.  Will watch your condition.  Will get help right away if you are not doing well or get worse. Document Released: 07/15/2005 Document Revised: 11/29/2013 Document Reviewed: 05/07/2013 [employer] Patient Information 2015 [employer]. This information is not intended to replace advice given to you by your health care provider. Make sure you discuss any questions you have with your health care provider.  

## 2014-03-15 NOTE — Assessment & Plan Note (Signed)
He has achieved his LDL goal 

## 2014-03-15 NOTE — Assessment & Plan Note (Signed)
His blood sugars are adequately well controlled 

## 2014-03-15 NOTE — Assessment & Plan Note (Signed)
I will check his PSA today to screen for ca He has no s/s that need treatment

## 2014-03-15 NOTE — Progress Notes (Signed)
Pre visit review using our clinic review tool, if applicable. No additional management support is needed unless otherwise documented below in the visit note. 

## 2014-03-15 NOTE — Assessment & Plan Note (Signed)
His BP is not well controlled He will work on his lifestyle modifications

## 2014-03-17 ENCOUNTER — Encounter: Payer: Self-pay | Admitting: Internal Medicine

## 2014-03-21 ENCOUNTER — Encounter (INDEPENDENT_AMBULATORY_CARE_PROVIDER_SITE_OTHER): Payer: Self-pay | Admitting: Ophthalmology

## 2014-03-22 ENCOUNTER — Encounter (INDEPENDENT_AMBULATORY_CARE_PROVIDER_SITE_OTHER): Payer: Self-pay | Admitting: Ophthalmology

## 2014-03-29 ENCOUNTER — Encounter (INDEPENDENT_AMBULATORY_CARE_PROVIDER_SITE_OTHER): Payer: Medicare Other | Admitting: Ophthalmology

## 2014-03-29 ENCOUNTER — Telehealth: Payer: Self-pay | Admitting: Internal Medicine

## 2014-03-29 DIAGNOSIS — E1139 Type 2 diabetes mellitus with other diabetic ophthalmic complication: Secondary | ICD-10-CM | POA: Diagnosis not present

## 2014-03-29 DIAGNOSIS — E11319 Type 2 diabetes mellitus with unspecified diabetic retinopathy without macular edema: Secondary | ICD-10-CM | POA: Diagnosis not present

## 2014-03-29 DIAGNOSIS — H3530 Unspecified macular degeneration: Secondary | ICD-10-CM

## 2014-03-29 DIAGNOSIS — E1165 Type 2 diabetes mellitus with hyperglycemia: Secondary | ICD-10-CM | POA: Diagnosis not present

## 2014-03-29 DIAGNOSIS — H31009 Unspecified chorioretinal scars, unspecified eye: Secondary | ICD-10-CM

## 2014-03-29 DIAGNOSIS — I10 Essential (primary) hypertension: Secondary | ICD-10-CM | POA: Diagnosis not present

## 2014-03-29 DIAGNOSIS — H35039 Hypertensive retinopathy, unspecified eye: Secondary | ICD-10-CM

## 2014-03-29 LAB — HM DIABETES EYE EXAM

## 2014-03-29 NOTE — Telephone Encounter (Signed)
Patient would like to know if [facility] [employer] had faxed over paper work?  Please advise   Call back: [phone]  Thank you

## 2014-03-30 NOTE — Telephone Encounter (Signed)
Returned pt's call and advised him that we have not received any paperwork from [employer]. Pt asked for fax number (the number he had was incorrect). Pt will contact them and ask them to resend the paperwork.

## 2014-04-11 ENCOUNTER — Encounter: Payer: Self-pay | Admitting: Internal Medicine

## 2014-05-04 ENCOUNTER — Ambulatory Visit: Payer: BC Managed Care – PPO | Admitting: Internal Medicine

## 2014-05-19 ENCOUNTER — Ambulatory Visit: Payer: Medicare Other | Admitting: Cardiology

## 2014-06-02 ENCOUNTER — Other Ambulatory Visit: Payer: Self-pay | Admitting: *Deleted

## 2014-06-02 ENCOUNTER — Ambulatory Visit (INDEPENDENT_AMBULATORY_CARE_PROVIDER_SITE_OTHER): Payer: Medicare Other | Admitting: Internal Medicine

## 2014-06-02 ENCOUNTER — Encounter: Payer: Self-pay | Admitting: Internal Medicine

## 2014-06-02 VITALS — BP 124/70 | HR 67 | Temp 98.0°F | Resp 12 | Wt 204.0 lb

## 2014-06-02 DIAGNOSIS — E1121 Type 2 diabetes mellitus with diabetic nephropathy: Secondary | ICD-10-CM | POA: Diagnosis not present

## 2014-06-02 DIAGNOSIS — Z23 Encounter for immunization: Secondary | ICD-10-CM

## 2014-06-02 LAB — HEMOGLOBIN A1C: Hgb A1c MFr Bld: 6.6 % — ABNORMAL HIGH (ref 4.6–6.5)

## 2014-06-02 MED ORDER — INSULIN DETEMIR 100 UNIT/ML ~~LOC~~ SOLN: 12.0000 [IU] | Freq: Every day | SUBCUTANEOUS | Status: DC

## 2014-06-02 NOTE — Progress Notes (Signed)
Patient ID: [name] [name], male   DOB: 02/16/1951, 63 y.o.   MRN: [id number]  HPI: [name] [name] is a 63 y.o.-year-old male, returning for f/u for DM2, dx in 06/2012, insulin-dependent since 09/2012, uncontrolled, with complications (CKD stage 3, mild DR). Last visit 3 mo ago.  Last hemoglobin A1c was: Lab Results  Component Value Date   HGBA1C 7.3* 02/01/2014   HGBA1C 6.8* 08/26/2013   HGBA1C 7.2* 05/18/2013   Pt is on a regimen of: - Metformin 500 mg po bid with meals - ran out 3 days ago - Levemir 15 >> 12 units qhs - Tradjenta 5 mg daily - restarted 01/2014. He has Novolog SSI - target 140, adds 2 units for approx 30 mg/dL - did not have to use it  He had to stop Tradjenta 5 mg daily 3 weeks ago as his M'[employer] did not cover it. He heard about Victoza, but it is not very eager to try newer medicines.  He checks sugars 3x a day and they are improved: - am: 100-157, but ave. 127-137 >> 101-120 >> 101-123 >> 90-130 >> 97-110 - 1h after b'fast: 105-168 >> n/c >> <150 >> max 120 - before lunch: 117-121 >> 90-131, but one high at 183 >> 97-147 >> 90-150 >> n/c - 2h after lunch: up to 130-140 - before dinner: (snacks in pm, mainly chocolate): 120-130s >> 122-198 >> 101-173 >> 117-161 >> 112-132 - 2h after dinner: 122-131 >> n/c >> 160-170 >> 120-140 - bedtime: 119-164 >> not checking No lows. Lowest sugar was 82; does not know if has hypoglycemia awareness. Highest sugar was 160.  Pt has chronic kidney disease, last BUN/creatinine was:  Lab Results  Component Value Date   BUN 17 03/15/2014   CREATININE 1.3 03/15/2014  Last ACR 0.6 12/2012. Last set of lipids: Lab Results  Component Value Date   CHOL 197 03/15/2014   HDL 39.00* 03/15/2014   LDLCALC 133* 10/19/2012   LDLDIRECT 128.1 03/15/2014   TRIG 239.0* 03/15/2014   CHOLHDL 5 03/15/2014  He restarted Crestor 5 mg 2x a week. Pt's last eye exam was 03/29/2014. dr Zigmund Daniel . 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 Denies numbness and tingling in his legs.   Denies increased thirst and urination or any blurry vision.  He also has a history of nonischemic cardiomyopathy, with 2-D echo showing an EF of 23-30%, grade 1 diastolic dysfunction, severe LV H, normal LV size and mildly dilated LA. He also has hyperlipidemia, history of atrial fibrillation, COPD and GERD. No h/o pancreatitis.   I reviewed pt's medications, allergies, PMH, social hx, family hx and no changes required, except as mentioned above.   ROS: Constitutional: + weight gain, no fatigue, no subjective hyperthermia/hypothermia Eyes: no blurry vision, no xerophthalmia ENT: no sore throat, no nodules palpated in throat, no dysphagia/odynophagia, no hoarseness Cardiovascular: no CP/SOB/palpitations/leg swelling Respiratory: no cough/SOB, no wheezing Gastrointestinal: no N/V/D/C, no heartburn Musculoskeletal: no muscle/ joint aches Skin: no rashes Neurological: no tremors/numbness/tingling/dizziness  PE: BP 124/70 mmHg  Pulse 67  Temp(Src) 98 F (36.7 C) (Oral)  Resp 12  Wt 204 lb (92.534 kg)  SpO2 97% Wt Readings from Last 3 Encounters:  06/02/14 204 lb (92.534 kg)  03/15/14 204 lb 6.4 oz (92.715 kg)  02/01/14 212 lb (96.163 kg)   Constitutional: overweight, in NAD Eyes: PERRLA, EOMI, no exophthalmos ENT: moist mucous membranes, no thyromegaly, no cervical lymphadenopathy Cardiovascular: RRR, No MRG Respiratory: CTA B Gastrointestinal: abdomen soft, NT, ND,  BS+ Musculoskeletal: no deformities, strength intact in all 4 Skin: moist, warm, no rashes Neurological: no tremor with outstretched hands, DTR normal in all 4  ASSESSMENT: 1. DM2, insulin-dependent, uncontrolled, with complications - CKD stage III - mild DR  PLAN:  1. Patient with improved diabetes control after addition of Tradjenta. Congratulated him on his weight loss: 8 lbs since last visit! - I advised him to: Patient Instructions  Please  continue: - Metformin 500 mg 2x a day with meals   - Levemir 12 units at bedtime - Tradjenta 5 mg in am Please return in 3 months with your sugar log.  Please stop at the lab. - will check a hemoglobin A1c today.  - will give flu vaccine today - up to date with eye exams - I will  see him back in 3 months with his sugar log - continue to check sugars 1-2 times a day  Office Visit on 06/02/2014  Component Date Value Ref Range Status  . Hgb A1c MFr Bld 06/02/2014 6.6* 4.6 - 6.5 % Final   Glycemic Control Guidelines for People with Diabetes:Non Diabetic:  <6%Goal of Therapy: <7%Additional Action Suggested:  >8%    Best HbA1c in a long time!

## 2014-06-02 NOTE — Patient Instructions (Signed)
Please continue: - Metformin 500 mg 2x a day with meals   - Levemir 12 units at bedtime - Tradjenta 5 mg in am  Please return in 3 months with your sugar log.   Please stop at the lab. 

## 2014-06-07 NOTE — Progress Notes (Signed)
HPI: 63 year old male for follow-up of atrial fibrillation. Patient was admitted in March 2014 with encephalopathy and hyperosmolar nonketotic state. Patient developed atrial fibrillation which was treated with Cardizem. He converted to sinus rhythm. Echocardiogram March 2014 showed an ejection fraction of 65-70%, severe left ventricular hypertrophy, hyperdynamic LV function, left ventricular outflow track gradient of 2.5 m/s. Patient was discharged on anticoagulation. He has not been seen since that time. The patient has dyspnea with more extreme activities but not routine activities. No orthopnea, PND, pedal edema or chest pain or syncope. He has occasional palpitations but none in the past 1 year.  Current Outpatient Prescriptions  Medication Sig Dispense Refill  . albuterol (PROVENTIL HFA;VENTOLIN HFA) 108 (90 BASE) MCG/ACT inhaler Inhale 2 puffs into the lungs every 6 (six) hours as needed for wheezing. 18 g 5  . Cholecalciferol (VITAMIN D3) 5000 UNITS TABS Take by mouth.    . fish oil-omega-3 fatty acids 1000 MG capsule Take 2 g by mouth daily.     Marland Kitchen glucose blood (ONE TOUCH ULTRA TEST) test strip Use to check blood sugar 3 times daily as instructed. Dx code: 250.02 300 each 11  . hydrochlorothiazide (HYDRODIURIL) 25 MG tablet Take 1 tablet (25 mg total) by mouth daily. 90 tablet 3  . insulin detemir (LEVEMIR) 100 UNIT/ML injection Inject 0.12 mLs (12 Units total) into the skin daily. 10 mL 2  . Insulin Syringe-Needle U-100 (INSULIN SYRINGE .3CC/31GX5/16") 31G X 5/16" 0.3 ML MISC 1 each by Does not apply route daily. Use 4x a day 200 each 11  . linagliptin (TRADJENTA) 5 MG TABS tablet Take 1 tablet (5 mg total) by mouth daily. 30 tablet 11  . losartan (COZAAR) 100 MG tablet Take 1 tablet (100 mg total) by mouth daily. 90 tablet 3  . metFORMIN (GLUCOPHAGE) 500 MG tablet Take 1 tablet (500 mg total) by mouth 2 (two) times daily with a meal. 60 tablet 11  . metoprolol succinate (TOPROL-XL)  50 MG 24 hr tablet TAKE ONE TABLET BY MOUTH ONCE DAILY WITH, OR IMMEDIATELY FOLLOWING, A MEAL 90 tablet 3  . omeprazole (PRILOSEC) 20 MG capsule TAKE ONE CAPSULE BY MOUTH ONCE DAILY 90 capsule 3  . rosuvastatin (CRESTOR) 10 MG tablet Take 1 tablet (10 mg total) by mouth daily. 112 tablet 0   No current facility-administered medications for this visit.     Past Medical History  Diagnosis Date  . GERD (gastroesophageal reflux disease)   . Allergy   . Diverticulitis   . History of colon polyps   . COPD (chronic obstructive pulmonary disease)   . Cardiomyopathy 2011    Patient was told that he had an ejection fraction of 37% by echo done elsewhere in 2011  . Hypertension   . Tobacco abuse   . Asthma   . Diabetes mellitus without complication   . Atrial fibrillation     Past Surgical History  Procedure Laterality Date  . Colon surgery  2009    History   Social History  . Marital Status: Divorced    Spouse Name: N/A    Number of Children: N/A  . Years of Education: N/A   Occupational History  . Not on file.   Social History Main Topics  . Smoking status: Former Smoker    Types: Cigarettes    Quit date: 07/30/2011  . Smokeless tobacco: Never Used     Comment: Smoked since age 52  . Alcohol Use: 0.5 oz/week  1 drink(s) per week  . Drug Use: No  . Sexual Activity: Not Currently   Other Topics Concern  . Not on file   Social History Narrative    ROS: no fevers or chills, productive cough, hemoptysis, dysphasia, odynophagia, melena, hematochezia, dysuria, hematuria, rash, seizure activity, orthopnea, PND, pedal edema, claudication. Remaining systems are negative.  Physical Exam: Well-developed well-nourished in no acute distress.  Skin is warm and dry.  HEENT is normal.  Neck is supple.  Chest is clear to auscultation with normal expansion.  Cardiovascular exam is regular rate and rhythm.  Abdominal exam nontender or distended. No masses palpated. Positive  bruit Extremities show no edema. neuro grossly intact  ECG Sinus rhythm with occasional PAC. RV conduction delay. Anterior lateral T-wave inversion.

## 2014-06-10 ENCOUNTER — Encounter: Payer: Self-pay | Admitting: Cardiology

## 2014-06-10 ENCOUNTER — Ambulatory Visit (INDEPENDENT_AMBULATORY_CARE_PROVIDER_SITE_OTHER): Payer: Medicare Other | Admitting: Cardiology

## 2014-06-10 ENCOUNTER — Encounter: Payer: Self-pay | Admitting: *Deleted

## 2014-06-10 VITALS — BP 160/80 | HR 62 | Ht 70.0 in | Wt 207.5 lb

## 2014-06-10 DIAGNOSIS — E785 Hyperlipidemia, unspecified: Secondary | ICD-10-CM

## 2014-06-10 DIAGNOSIS — R0989 Other specified symptoms and signs involving the circulatory and respiratory systems: Secondary | ICD-10-CM | POA: Insufficient documentation

## 2014-06-10 DIAGNOSIS — I429 Cardiomyopathy, unspecified: Secondary | ICD-10-CM

## 2014-06-10 DIAGNOSIS — Z720 Tobacco use: Secondary | ICD-10-CM | POA: Insufficient documentation

## 2014-06-10 DIAGNOSIS — I480 Paroxysmal atrial fibrillation: Secondary | ICD-10-CM

## 2014-06-10 NOTE — Assessment & Plan Note (Signed)
Scheduled abdominal ultrasound to exclude aneurysm. 

## 2014-06-10 NOTE — Assessment & Plan Note (Signed)
Continue statin. 

## 2014-06-10 NOTE — Assessment & Plan Note (Signed)
Patient counseled on discontinuing. 

## 2014-06-10 NOTE — Assessment & Plan Note (Signed)
Blood pressure is elevated but he states typically controlled. I have asked him to follow this at home. We will add additional medications as needed. I would most likely add Norvasc next if blood pressure remains high.

## 2014-06-10 NOTE — Assessment & Plan Note (Signed)
LV function normal on most recent echocardiogram. Will repeat.

## 2014-06-10 NOTE — Assessment & Plan Note (Signed)
Patient remains in sinus rhythm. Continue Toprol. We had a discussion today concerning the risk of embolic events including CVA. I recommended apixaban. However he states he had internal bleeding with anticoagulation previously. He declined and prefers to continue aspirin 81 mg daily. He understands the higher risk of CVA.

## 2014-06-10 NOTE — Patient Instructions (Signed)
Your physician wants you to follow-up in: [place] will receive a reminder letter in the mail two months in advance. If you don't receive a letter, please call our office to schedule the follow-up appointment.   Your physician has requested that you have an abdominal aorta duplex. During this test, an ultrasound is used to evaluate the aorta. Allow 30 minutes for this exam. Do not eat after midnight the day before and avoid carbonated beverages   Your physician has requested that you have an echocardiogram. Echocardiography is a painless test that uses sound waves to create images of your heart. It provides your doctor with information about the size and shape of your heart and how well your heart's chambers and valves are working. This procedure takes approximately one hour. There are no restrictions for this procedure.

## 2014-06-20 ENCOUNTER — Other Ambulatory Visit: Payer: Self-pay | Admitting: *Deleted

## 2014-06-20 MED ORDER — GLUCOSE BLOOD VI STRP: ORAL_STRIP | Status: DC

## 2014-06-28 ENCOUNTER — Ambulatory Visit (HOSPITAL_COMMUNITY)
Admission: RE | Admit: 2014-06-28 | Discharge: 2014-06-28 | Disposition: A | Discharge status: Home / Self Care | Payer: Medicare Other | Source: Ambulatory Visit | Attending: Cardiovascular Disease | Admitting: Cardiovascular Disease

## 2014-06-28 ENCOUNTER — Other Ambulatory Visit: Payer: Self-pay | Admitting: *Deleted

## 2014-06-28 DIAGNOSIS — R0989 Other specified symptoms and signs involving the circulatory and respiratory systems: Secondary | ICD-10-CM | POA: Diagnosis not present

## 2014-06-28 DIAGNOSIS — I429 Cardiomyopathy, unspecified: Secondary | ICD-10-CM | POA: Insufficient documentation

## 2014-06-28 DIAGNOSIS — I4891 Unspecified atrial fibrillation: Secondary | ICD-10-CM | POA: Insufficient documentation

## 2014-06-28 DIAGNOSIS — F17200 Nicotine dependence, unspecified, uncomplicated: Secondary | ICD-10-CM | POA: Insufficient documentation

## 2014-06-28 DIAGNOSIS — I059 Rheumatic mitral valve disease, unspecified: Secondary | ICD-10-CM | POA: Diagnosis not present

## 2014-06-28 DIAGNOSIS — I480 Paroxysmal atrial fibrillation: Secondary | ICD-10-CM | POA: Insufficient documentation

## 2014-06-28 DIAGNOSIS — E119 Type 2 diabetes mellitus without complications: Secondary | ICD-10-CM | POA: Diagnosis not present

## 2014-06-28 DIAGNOSIS — R931 Abnormal findings on diagnostic imaging of heart and coronary circulation: Secondary | ICD-10-CM

## 2014-06-28 DIAGNOSIS — I10 Essential (primary) hypertension: Secondary | ICD-10-CM | POA: Diagnosis not present

## 2014-06-28 DIAGNOSIS — I714 Abdominal aortic aneurysm, without rupture: Secondary | ICD-10-CM | POA: Insufficient documentation

## 2014-06-28 NOTE — Progress Notes (Signed)
Abdominal Aorta Duplex Completed. Joslyne Marshburn, BS, RDMS, RVT  

## 2014-06-28 NOTE — Progress Notes (Signed)
2D Echocardiogram Complete.  06/28/2014   S[place], [place]

## 2014-06-29 ENCOUNTER — Encounter: Payer: Self-pay | Admitting: Cardiology

## 2014-06-30 ENCOUNTER — Other Ambulatory Visit: Payer: Self-pay | Admitting: *Deleted

## 2014-06-30 DIAGNOSIS — I10 Essential (primary) hypertension: Secondary | ICD-10-CM

## 2014-06-30 DIAGNOSIS — R931 Abnormal findings on diagnostic imaging of heart and coronary circulation: Secondary | ICD-10-CM

## 2014-07-07 ENCOUNTER — Telehealth: Payer: Self-pay | Admitting: *Deleted

## 2014-07-07 ENCOUNTER — Ambulatory Visit (HOSPITAL_COMMUNITY)
Admission: RE | Admit: 2014-07-07 | Discharge: 2014-07-07 | Disposition: A | Discharge status: Home / Self Care | Payer: Medicare Other | Source: Ambulatory Visit | Attending: Cardiology | Admitting: Cardiology

## 2014-07-07 DIAGNOSIS — I10 Essential (primary) hypertension: Secondary | ICD-10-CM | POA: Diagnosis not present

## 2014-07-07 DIAGNOSIS — E119 Type 2 diabetes mellitus without complications: Secondary | ICD-10-CM | POA: Insufficient documentation

## 2014-07-07 DIAGNOSIS — Z5329 Procedure and treatment not carried out because of patient's decision for other reasons: Secondary | ICD-10-CM | POA: Insufficient documentation

## 2014-07-07 DIAGNOSIS — F40240 Claustrophobia: Secondary | ICD-10-CM | POA: Insufficient documentation

## 2014-07-07 DIAGNOSIS — I714 Abdominal aortic aneurysm, without rupture: Secondary | ICD-10-CM | POA: Diagnosis not present

## 2014-07-07 LAB — CREATININE, SERUM
CREATININE: 1.17 mg/dL (ref 0.50–1.35)
GFR calc non Af Amer: 64 mL/min — ABNORMAL LOW (ref >90)
GFR, EST AFRICAN AMERICAN: 74 mL/min — AB (ref >90)

## 2014-07-07 NOTE — Telephone Encounter (Signed)
Ativan 0.5 mg IV prior to MRI; may repeat x 1 if needed (total 1 mg). Kirk Ruths

## 2014-07-07 NOTE — Progress Notes (Signed)
Pt is claustrophobic. Wanted to reschedule with medication. He did not feel he could complete the exam today. He is rescheduled for 07/19/14 at 10 am.

## 2014-07-07 NOTE — Telephone Encounter (Signed)
Patient arrived to have cardiac MRI today and is claustrophobic and had to reschedule. They need an order to give the patient ativan at time of the procedure or they need [place] to call the patient something into the pharmacy to take pre-procedure. Will forward for dr Stanford Breed review

## 2014-07-08 NOTE — Telephone Encounter (Signed)
Spoke with pt, aware he will receive sedation when he gets to the hosp prior to the MRI. Patient voiced understanding he will need a driver.

## 2014-07-19 ENCOUNTER — Encounter: Payer: Self-pay | Admitting: Cardiology

## 2014-07-19 ENCOUNTER — Ambulatory Visit (HOSPITAL_COMMUNITY)
Admission: RE | Admit: 2014-07-19 | Discharge: 2014-07-19 | Disposition: A | Discharge status: Home / Self Care | Payer: Medicare Other | Source: Ambulatory Visit | Attending: Cardiology | Admitting: Cardiology

## 2014-08-09 ENCOUNTER — Ambulatory Visit (HOSPITAL_COMMUNITY): Admission: RE | Admit: 2014-08-09 | Payer: Medicare Other | Source: Ambulatory Visit

## 2014-08-29 ENCOUNTER — Ambulatory Visit (HOSPITAL_COMMUNITY): Admission: RE | Admit: 2014-08-29 | Payer: Medicare Other | Source: Ambulatory Visit

## 2014-09-01 ENCOUNTER — Ambulatory Visit: Payer: Medicare Other | Admitting: Internal Medicine

## 2014-10-04 ENCOUNTER — Ambulatory Visit: Payer: Medicare Other | Admitting: Internal Medicine

## 2014-10-11 ENCOUNTER — Ambulatory Visit (INDEPENDENT_AMBULATORY_CARE_PROVIDER_SITE_OTHER): Payer: Medicare Other | Admitting: Internal Medicine

## 2014-10-11 ENCOUNTER — Encounter: Payer: Self-pay | Admitting: Internal Medicine

## 2014-10-11 VITALS — BP 132/70 | HR 60 | Temp 98.1°F | Resp 12 | Wt 203.0 lb

## 2014-10-11 DIAGNOSIS — E1121 Type 2 diabetes mellitus with diabetic nephropathy: Secondary | ICD-10-CM | POA: Diagnosis not present

## 2014-10-11 NOTE — Progress Notes (Signed)
Patient ID: [name] [name], male   DOB: May 14, 1951, 65 y.o.   MRN: [id number]  HPI: [name] [name] is a 64 y.o.-year-old male, returning for f/u for DM2, dx in 06/2012, insulin-dependent since 09/2012, uncontrolled, with complications (CKD stage 3, mild DR). Last visit 4 mo ago.  Last hemoglobin A1c was: Lab Results  Component Value Date   HGBA1C 6.6* 06/02/2014   HGBA1C 7.3* 02/01/2014   HGBA1C 6.8* 08/26/2013   Pt is on a regimen of: - Metformin 500 mg 2x a day with meals - Levemir 12 units at bedtime - Tradjenta 5 mg daily - restarted 01/2014. He has Novolog SSI - target 140, adds 2 units for approx 30 mg/dL - did not have to use it He had to stop Tradjenta 5 mg daily in the past as his [employer] did not cover it. Pays 47$ for 1 mo (tier 3). He heard about Victoza, but it is not very eager to try newer medicines.  He checks sugars 3x a day and they are great: - am: 100-157, but ave. 127-137 >> 101-120 >> 101-123 >> 90-130 >> 97-110 >> 101-110 - 1h after b'fast: 105-168 >> n/c >> <150 >> max 120 >> n/c - before lunch: 117-121 >> 90-131, but one high at 183 >> 97-147 >> 90-150 >> n/c >> 116-117 - 2h after lunch: up to 130-140 - before dinner: 122-198 >> 101-173 >> 117-161 >> 112-132 >> 117-130 - 2h after dinner: 122-131 >> n/c >> 160-170 >> 120-140 >> <140 - bedtime: 119-164 >> not checking No lows. Lowest sugar was 82 >> 78; does not know if has hypoglycemia awareness. Highest sugar was 160 >> 200 x2 - overate.  Pt has chronic kidney disease, last BUN/creatinine was:  Lab Results  Component Value Date   BUN 17 03/15/2014   CREATININE 1.17 07/07/2014  Last ACR 0.6 12/2012. Last set of lipids: Lab Results  Component Value Date   CHOL 197 03/15/2014   HDL 39.00* 03/15/2014   LDLCALC 133* 10/19/2012   LDLDIRECT 128.1 03/15/2014   TRIG 239.0* 03/15/2014   CHOLHDL 5 03/15/2014  He restarted Crestor 5 mg 1x a week.  Pt's last eye exam was 03/29/2014. dr Zigmund Daniel . + bilateral  mild DR, but also has HT-ive retinupathy. Denies numbness and tingling in his legs.   Denies increased thirst and urination or any blurry vision.  He also has a history of nonischemic cardiomyopathy, with 2-D echo showing an EF of 26-94%, grade 1 diastolic dysfunction, severe LV H, normal LV size and mildly dilated LA. He also has hyperlipidemia, history of atrial fibrillation, COPD and GERD. No h/o pancreatitis.   I reviewed pt's medications, allergies, PMH, social hx, family hx, and changes were documented in the history of present illness. Otherwise, unchanged from my initial visit note.  ROS: Constitutional: no weight gain, no fatigue, no subjective hyperthermia/hypothermia Eyes: no blurry vision, no xerophthalmia ENT: no sore throat, no nodules palpated in throat, no dysphagia/odynophagia, + hoarseness Cardiovascular: no CP/SOB/palpitations/leg swelling Respiratory: no cough/SOB, no wheezing Gastrointestinal: no N/V/D/C, no heartburn Musculoskeletal: no muscle/ joint aches Skin: no rashes Neurological: no tremors/numbness/tingling/dizziness  PE: BP 132/70 mmHg  Pulse 60  Temp(Src) 98.1 F (36.7 C) (Oral)  Resp 12  Wt 203 lb (92.08 kg)  SpO2 96% Wt Readings from Last 3 Encounters:  10/11/14 203 lb (92.08 kg)  06/10/14 207 lb 8 oz (94.121 kg)  06/02/14 204 lb (92.534 kg)   Constitutional: overweight, in NAD Eyes: PERRLA, EOMI, no exophthalmos ENT: moist  mucous membranes, no thyromegaly, no cervical lymphadenopathy Cardiovascular: RRR, No MRG Respiratory: CTA B Gastrointestinal: abdomen soft, NT, ND, BS+ Musculoskeletal: no deformities, strength intact in all 4 Skin: moist, warm, no rashes Neurological: no tremor with outstretched hands, DTR normal in all 4  ASSESSMENT: 1. DM2, insulin-dependent, uncontrolled, with complications - CKD stage III - mild DR  He sees Dr Stanford Breed for Afib and cardiomyopathy (LV Htf)  PLAN:  1. Patient with improved diabetes control  after addition of Tradjenta. Will continue the same regimen. - I advised him to: Patient Instructions  Please continue: - Metformin 500 mg 2x a day with meals   - Levemir 12 units at bedtime - Tradjenta 5 mg in am Please return in 3 months with your sugar log.  Please stop at the lab. - will check a hemoglobin A1c today.  - given flu vaccine at last visit - up to date with eye exams - continue to check sugars 1-2 times a day - I will see him back in 3 months with his sugar log - he will go to [place] in July  - will come before then.  Office Visit on 10/11/2014  Component Date Value Ref Range Status  . Hgb A1c MFr Bld 10/11/2014 7.0* 4.6 - 6.5 % Final   Glycemic Control Guidelines for People with Diabetes:Non Diabetic:  <6%Goal of Therapy: <7%Additional Action Suggested:  >8%

## 2014-10-11 NOTE — Patient Instructions (Signed)
Please continue: - Metformin 500 mg 2x a day with meals   - Levemir 12 units at bedtime - Tradjenta 5 mg in am  Please return in 3 months with your sugar log.   Please stop at the lab.

## 2014-10-12 LAB — HEMOGLOBIN A1C: HEMOGLOBIN A1C: 7 % — AB (ref 4.6–6.5)

## 2014-12-20 NOTE — Progress Notes (Signed)
HPI: FU atrial fibrillation. Patient was admitted in March 2014 with encephalopathy and hyperosmolar nonketotic state. Patient developed atrial fibrillation which was treated with Cardizem. He converted to sinus rhythm. Echocardiogram March 2014 showed an ejection fraction of 65-70%, severe left ventricular hypertrophy, hyperdynamic LV function, left ventricular outflow track gradient of 2.5 m/s. Repeat study 12/15 showed severe assymetric LVH with no LVOT gradient; normal LV function, mild LAE and mild MR. MRI scheduled but not performed. 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 FU recommended 2 years. Since last seen, the patient denies any dyspnea on exertion, orthopnea, PND, pedal edema, palpitations, syncope or chest pain.   Current Outpatient Prescriptions  Medication Sig Dispense Refill  . albuterol (PROVENTIL HFA;VENTOLIN HFA) 108 (90 BASE) MCG/ACT inhaler Inhale 2 puffs into the lungs every 6 (six) hours as needed for wheezing. 18 g 5  . aspirin 81 MG chewable tablet Chew 81 mg by mouth daily.    . fish oil-omega-3 fatty acids 1000 MG capsule Take 2 g by mouth daily.     Marland Kitchen glucose blood (ONE TOUCH ULTRA TEST) test strip Use to check blood sugar 3 times daily as instructed. Dx code: E11.21 300 each 3  . hydrochlorothiazide (HYDRODIURIL) 25 MG tablet Take 1 tablet (25 mg total) by mouth daily. 90 tablet 3  . insulin detemir (LEVEMIR) 100 UNIT/ML injection Inject 0.12 mLs (12 Units total) into the skin daily. 10 mL 2  . Insulin Syringe-Needle U-100 (INSULIN SYRINGE .3CC/31GX5/16") 31G X 5/16" 0.3 ML MISC 1 each by Does not apply route daily. Use 4x a day 200 each 11  . linagliptin (TRADJENTA) 5 MG TABS tablet Take 1 tablet (5 mg total) by mouth daily. 30 tablet 11  . losartan (COZAAR) 100 MG tablet Take 1 tablet (100 mg total) by mouth daily. 90 tablet 3  . metFORMIN (GLUCOPHAGE) 500 MG tablet Take 1 tablet (500 mg total) by mouth 2 (two) times daily  with a meal. 60 tablet 11  . metoprolol succinate (TOPROL-XL) 50 MG 24 hr tablet TAKE ONE TABLET BY MOUTH ONCE DAILY WITH, OR IMMEDIATELY FOLLOWING, A MEAL 90 tablet 3  . omeprazole (PRILOSEC) 20 MG capsule TAKE ONE CAPSULE BY MOUTH ONCE DAILY 90 capsule 3  . rosuvastatin (CRESTOR) 10 MG tablet Take 1 tablet (10 mg total) by mouth daily. 112 tablet 0   No current facility-administered medications for this visit.     Past Medical History  Diagnosis Date  . GERD (gastroesophageal reflux disease)   . Allergy   . Diverticulitis   . History of colon polyps   . COPD (chronic obstructive pulmonary disease)   . Cardiomyopathy 2011    Patient was told that he had an ejection fraction of 37% by echo done elsewhere in 2011  . Hypertension   . Tobacco abuse   . Asthma   . Diabetes mellitus without complication   . Atrial fibrillation     Past Surgical History  Procedure Laterality Date  . Colon surgery  2009    History   Social History  . Marital Status: Divorced    Spouse Name: N/A  . Number of Children: N/A  . Years of Education: N/A   Occupational History  . Not on file.   Social History Main Topics  . Smoking status: Former Smoker    Types: Cigarettes    Quit date: 07/30/2011  . Smokeless tobacco: Never Used     Comment: Smoked since age  20  . Alcohol Use: 0.5 oz/week    1 Standard drinks or equivalent per week  . Drug Use: No  . Sexual Activity: Not Currently   Other Topics Concern  . Not on file   Social History Narrative    ROS: no fevers or chills, productive cough, hemoptysis, dysphasia, odynophagia, melena, hematochezia, dysuria, hematuria, rash, seizure activity, orthopnea, PND, pedal edema, claudication. Remaining systems are negative.  Physical Exam: Well-developed well-nourished in no acute distress.  Skin is warm and dry.  HEENT is normal.  Neck is supple.  Chest is clear to auscultation with normal expansion.  Cardiovascular exam is regular rate  and rhythm.  Abdominal exam nontender or distended. No masses palpated. Extremities show no edema. neuro grossly intact  ECG sinus bradycardia at a rate of 55. Lateral T-wave inversion.

## 2014-12-27 ENCOUNTER — Ambulatory Visit (INDEPENDENT_AMBULATORY_CARE_PROVIDER_SITE_OTHER): Payer: Medicare Other | Admitting: Cardiology

## 2014-12-27 ENCOUNTER — Encounter: Payer: Self-pay | Admitting: Cardiology

## 2014-12-27 VITALS — BP 188/96 | HR 55 | Ht 70.0 in | Wt 203.0 lb

## 2014-12-27 DIAGNOSIS — I10 Essential (primary) hypertension: Secondary | ICD-10-CM

## 2014-12-27 DIAGNOSIS — I723 Aneurysm of iliac artery: Secondary | ICD-10-CM

## 2014-12-27 DIAGNOSIS — I480 Paroxysmal atrial fibrillation: Secondary | ICD-10-CM | POA: Diagnosis not present

## 2014-12-27 DIAGNOSIS — I429 Cardiomyopathy, unspecified: Secondary | ICD-10-CM

## 2014-12-27 MED ORDER — AMLODIPINE BESYLATE 5 MG PO TABS: 5.0000 mg | ORAL_TABLET | Freq: Every day | ORAL | Status: DC

## 2014-12-27 MED ORDER — DIAZEPAM 5 MG PO TABS: ORAL_TABLET | ORAL | Status: DC

## 2014-12-27 NOTE — Assessment & Plan Note (Signed)
Blood pressure remains elevated. Add amlodipine 5 mg daily and follow.

## 2014-12-27 NOTE — Assessment & Plan Note (Signed)
Echocardiogram shows vigorous LV function and severe asymmetric left ventricular hypertrophy. Plan cardiac MRI to screen for hypertrophic cardiomyopathy. LVH may also be related to hypertension.

## 2014-12-27 NOTE — Assessment & Plan Note (Signed)
Follow-up ultrasound December 2017.

## 2014-12-27 NOTE — Assessment & Plan Note (Signed)
Patient remains in sinus rhythm. Continue Toprol. We had a discussion previously concerning the risk of embolic events including CVA. I recommended apixaban. However he stateed he had internal bleeding with anticoagulation previously. He declined and prefered to continue aspirin 81 mg daily. He understands the higher risk of CVA.

## 2014-12-27 NOTE — Patient Instructions (Signed)
Your physician wants you to follow-up in: [place] will receive a reminder letter in the mail two months in advance. If you don't receive a letter, please call our office to schedule the follow-up appointment.   START AMLODIPINE 5 MG ONCE DAILY  Your physician has requested that you have a cardiac MRI. Cardiac MRI uses a computer to create images of your heart as its beating, producing both still and moving pictures of your heart and major blood vessels. For further information please visit [email or web address] Please follow the instruction sheet given to you today for more information.   VALIUM 5 MG TAKE ONE TABLET 30 MIN TO 1 HOUR PRIOR TO PROCEDURE

## 2014-12-27 NOTE — Assessment & Plan Note (Signed)
Continue statin. 

## 2014-12-28 ENCOUNTER — Encounter: Payer: Self-pay | Admitting: Cardiology

## 2014-12-29 ENCOUNTER — Other Ambulatory Visit (INDEPENDENT_AMBULATORY_CARE_PROVIDER_SITE_OTHER): Payer: Medicare Other

## 2014-12-29 ENCOUNTER — Encounter: Payer: Self-pay | Admitting: Internal Medicine

## 2014-12-29 ENCOUNTER — Ambulatory Visit (INDEPENDENT_AMBULATORY_CARE_PROVIDER_SITE_OTHER): Payer: Medicare Other | Admitting: Internal Medicine

## 2014-12-29 VITALS — BP 158/86 | HR 58 | Temp 98.8°F | Resp 16 | Ht 70.0 in | Wt 199.0 lb

## 2014-12-29 DIAGNOSIS — I10 Essential (primary) hypertension: Secondary | ICD-10-CM | POA: Diagnosis not present

## 2014-12-29 DIAGNOSIS — E1121 Type 2 diabetes mellitus with diabetic nephropathy: Secondary | ICD-10-CM | POA: Diagnosis not present

## 2014-12-29 DIAGNOSIS — Z86010 Personal history of colonic polyps: Secondary | ICD-10-CM | POA: Diagnosis not present

## 2014-12-29 DIAGNOSIS — E785 Hyperlipidemia, unspecified: Secondary | ICD-10-CM

## 2014-12-29 DIAGNOSIS — D224 Melanocytic nevi of scalp and neck: Secondary | ICD-10-CM

## 2014-12-29 DIAGNOSIS — E876 Hypokalemia: Secondary | ICD-10-CM

## 2014-12-29 LAB — URINALYSIS, ROUTINE W REFLEX MICROSCOPIC
Hgb urine dipstick: NEGATIVE
Ketones, ur: NEGATIVE
LEUKOCYTES UA: NEGATIVE
NITRITE: NEGATIVE
RBC / HPF: NONE SEEN (ref 0–?)
Total Protein, Urine: 30 — AB
UROBILINOGEN UA: 0.2 (ref 0.0–1.0)
Urine Glucose: NEGATIVE
pH: 6 (ref 5.0–8.0)

## 2014-12-29 LAB — LIPID PANEL
CHOL/HDL RATIO: 4
Cholesterol: 200 mg/dL (ref 0–200)
HDL: 48.3 mg/dL (ref >39.00)
LDL Cholesterol: 124 mg/dL — ABNORMAL HIGH (ref 0–99)
NonHDL: 151.7
TRIGLYCERIDES: 137 mg/dL (ref 0.0–149.0)
VLDL: 27.4 mg/dL (ref 0.0–40.0)

## 2014-12-29 LAB — MICROALBUMIN / CREATININE URINE RATIO
Creatinine,U: 390.6 mg/dL
MICROALB UR: 7.3 mg/dL — AB (ref 0.0–1.9)
MICROALB/CREAT RATIO: 1.9 mg/g (ref 0.0–30.0)

## 2014-12-29 LAB — BASIC METABOLIC PANEL
BUN: 15 mg/dL (ref 6–23)
CALCIUM: 10.3 mg/dL (ref 8.4–10.5)
CO2: 31 mEq/L (ref 19–32)
Chloride: 97 mEq/L (ref 96–112)
Creatinine, Ser: 1.43 mg/dL (ref 0.40–1.50)
GFR: 63.65 mL/min (ref >60.00)
Glucose, Bld: 150 mg/dL — ABNORMAL HIGH (ref 70–99)
Potassium: 3.2 mEq/L — ABNORMAL LOW (ref 3.5–5.1)
SODIUM: 137 meq/L (ref 135–145)

## 2014-12-29 LAB — TSH: TSH: 1.77 u[IU]/mL (ref 0.35–4.50)

## 2014-12-29 LAB — HEMOGLOBIN A1C: HEMOGLOBIN A1C: 6.4 % (ref 4.6–6.5)

## 2014-12-29 MED ORDER — ATORVASTATIN CALCIUM 20 MG PO TABS: 20.0000 mg | ORAL_TABLET | Freq: Every day | ORAL | Status: DC

## 2014-12-29 MED ORDER — POTASSIUM CHLORIDE CRYS ER 20 MEQ PO TBCR: 20.0000 meq | EXTENDED_RELEASE_TABLET | Freq: Two times a day (BID) | ORAL | Status: DC

## 2014-12-29 NOTE — Progress Notes (Signed)
Pre visit review using our clinic review tool, if applicable. No additional management support is needed unless otherwise documented below in the visit note. 

## 2014-12-29 NOTE — Patient Instructions (Signed)

## 2014-12-29 NOTE — Progress Notes (Signed)
Subjective:  Patient ID: [name] [name], male    DOB: [birth year]  Age: 64 y.o. MRN: [id number]  CC: Hyperlipidemia; Hypertension; and Diabetes   HPI Tyran Huser presents for a routine follow up but he also asks to see a dermatologist about an enlaring mole on the right side of his neck.  Outpatient Prescriptions Prior to Visit  Medication Sig Dispense Refill  . albuterol (PROVENTIL HFA;VENTOLIN HFA) 108 (90 BASE) MCG/ACT inhaler Inhale 2 puffs into the lungs every 6 (six) hours as needed for wheezing. 18 g 5  . amLODipine (NORVASC) 5 MG tablet Take 1 tablet (5 mg total) by mouth daily. 90 tablet 3  . aspirin 81 MG chewable tablet Chew 81 mg by mouth daily.    . diazepam (VALIUM) 5 MG tablet TAKE 30 MIN TO 1 HOUR PRIOR TO PROCEDURE 2 tablet 0  . fish oil-omega-3 fatty acids 1000 MG capsule Take 2 g by mouth daily.     Marland Kitchen glucose blood (ONE TOUCH ULTRA TEST) test strip Use to check blood sugar 3 times daily as instructed. Dx code: E11.21 300 each 3  . hydrochlorothiazide (HYDRODIURIL) 25 MG tablet Take 1 tablet (25 mg total) by mouth daily. 90 tablet 3  . insulin detemir (LEVEMIR) 100 UNIT/ML injection Inject 0.12 mLs (12 Units total) into the skin daily. 10 mL 2  . Insulin Syringe-Needle U-100 (INSULIN SYRINGE .3CC/31GX5/16") 31G X 5/16" 0.3 ML MISC 1 each by Does not apply route daily. Use 4x a day 200 each 11  . linagliptin (TRADJENTA) 5 MG TABS tablet Take 1 tablet (5 mg total) by mouth daily. 30 tablet 11  . losartan (COZAAR) 100 MG tablet Take 1 tablet (100 mg total) by mouth daily. 90 tablet 3  . metFORMIN (GLUCOPHAGE) 500 MG tablet Take 1 tablet (500 mg total) by mouth 2 (two) times daily with a meal. 60 tablet 11  . metoprolol succinate (TOPROL-XL) 50 MG 24 hr tablet TAKE ONE TABLET BY MOUTH ONCE DAILY WITH, OR IMMEDIATELY FOLLOWING, A MEAL 90 tablet 3  . omeprazole (PRILOSEC) 20 MG capsule TAKE ONE CAPSULE BY MOUTH ONCE DAILY 90 capsule 3  . rosuvastatin (CRESTOR) 10 MG tablet  Take 1 tablet (10 mg total) by mouth daily. 112 tablet 0   No facility-administered medications prior to visit.    ROS Review of Systems  Constitutional: Negative.  Negative for fever, chills, diaphoresis, appetite change and fatigue.  HENT: Negative.   Eyes: Negative.   Respiratory: Negative.  Negative for cough, choking, chest tightness, shortness of breath and stridor.   Cardiovascular: Negative.  Negative for chest pain, palpitations and leg swelling.  Gastrointestinal: Negative.  Negative for nausea, vomiting, abdominal pain, diarrhea, constipation and blood in stool.  Endocrine: Negative.  Negative for polydipsia, polyphagia and polyuria.  Genitourinary: Negative.  Negative for difficulty urinating.  Musculoskeletal: Negative.   Skin: Negative.   Allergic/Immunologic: Negative.   Neurological: Negative.  Negative for dizziness, syncope, speech difficulty, light-headedness, numbness and headaches.  Hematological: Negative for adenopathy. Does not bruise/bleed easily.  Psychiatric/Behavioral: Negative.     Objective:  BP 158/86 mmHg  Pulse 58  Temp(Src) 98.8 F (37.1 C) (Oral)  Resp 16  Ht 5\' 10"  (1.778 m)  Wt 199 lb (90.266 kg)  BMI 28.55 kg/m2  SpO2 96%  BP Readings from Last 3 Encounters:  12/29/14 158/86  12/27/14 188/96  10/11/14 132/70    Wt Readings from Last 3 Encounters:  12/29/14 199 lb (90.266 kg)  12/27/14 203 lb (92.08  kg)  10/11/14 203 lb (92.08 kg)    Physical Exam  Constitutional: He is oriented to person, place, and time. He appears well-developed and well-nourished. No distress.  HENT:  Head: Normocephalic and atraumatic.  Mouth/Throat: Oropharynx is clear and moist. No oropharyngeal exudate.  Eyes: Conjunctivae are normal. Right eye exhibits no discharge. Left eye exhibits no discharge. No scleral icterus.  Neck: Normal range of motion. Neck supple. No JVD present. No tracheal deviation present. No thyromegaly present.    Cardiovascular:  Normal rate, regular rhythm, normal heart sounds and intact distal pulses.  Exam reveals no gallop and no friction rub.   No murmur heard. Pulmonary/Chest: Effort normal and breath sounds normal. No stridor. No respiratory distress. He has no wheezes. He has no rales. He exhibits no tenderness.  Abdominal: Soft. Bowel sounds are normal. He exhibits no distension and no mass. There is no tenderness. There is no rebound and no guarding.  Musculoskeletal: Normal range of motion. He exhibits no edema or tenderness.  Lymphadenopathy:    He has no cervical adenopathy.  Neurological: He is oriented to person, place, and time.  Skin: Skin is warm and dry. No rash noted. He is not diaphoretic. No erythema. No pallor.    Lab Results  Component Value Date   WBC 9.4 03/15/2014   HGB 14.0 03/15/2014   HCT 41.6 03/15/2014   PLT 292.0 03/15/2014   GLUCOSE 150* 12/29/2014   CHOL 200 12/29/2014   TRIG 137.0 12/29/2014   HDL 48.30 12/29/2014   LDLDIRECT 128.1 03/15/2014   LDLCALC 124* 12/29/2014   ALT 15 03/15/2014   AST 24 03/15/2014   NA 137 12/29/2014   K 3.2* 12/29/2014   CL 97 12/29/2014   CREATININE 1.43 12/29/2014   BUN 15 12/29/2014   CO2 31 12/29/2014   TSH 1.77 12/29/2014   PSA 1.28 03/15/2014   INR 0.91 10/19/2012   HGBA1C 6.4 12/29/2014   MICROALBUR 7.3* 12/29/2014    No results found.  Assessment & Plan:   Ilay was seen today for hyperlipidemia, hypertension and diabetes.  Diagnoses and all orders for this visit:  Atypical nevus of neck Orders: -     Ambulatory referral to Dermatology  History of colonic polyps Orders: -     Ambulatory referral to Gastroenterology  Essential hypertension, benign - his BP is not well controlled, he has not started amlodipine yet but he agrees to start it today, will replete his low K+ level Orders: -     Basic metabolic panel; Future -     Urinalysis, Routine w reflex microscopic (not at [facility]); Future -     potassium chloride SA  (K-DUR,KLOR-CON) 20 MEQ tablet; Take 1 tablet (20 mEq total) by mouth 2 (two) times daily.  Type 2 diabetes mellitus with diabetic nephropathy - his blood sugars are well controlled Orders: -     Lipid panel; Future -     Basic metabolic panel; Future -     Hemoglobin A1c; Future -     Urinalysis, Routine w reflex microscopic (not at [facility]); Future -     Microalbumin / creatinine urine ratio; Future  Hyperlipidemia with target LDL less than 100 - he has not achieved his LDL goal, will start a statin  Orders: -     Lipid panel; Future -     TSH; Future  Hypokalemia Orders: -     potassium chloride SA (K-DUR,KLOR-CON) 20 MEQ tablet; Take 1 tablet (20 mEq total) by mouth 2 (  two) times daily.   I am having Mr. Mehra start on potassium chloride SA. I am also having him maintain his fish oil-omega-3 fatty acids, rosuvastatin, albuterol, metoprolol succinate, omeprazole, hydrochlorothiazide, losartan, INSULIN SYRINGE .3CC/31GX5/16", linagliptin, metFORMIN, insulin detemir, aspirin, glucose blood, amLODipine, and diazepam.  Meds ordered this encounter  Medications  . potassium chloride SA (K-DUR,KLOR-CON) 20 MEQ tablet    Sig: Take 1 tablet (20 mEq total) by mouth 2 (two) times daily.    Dispense:  60 tablet    Refill:  5     Follow-up: Return in about 4 weeks (around 01/26/2015).  Scarlette Calico, MD

## 2014-12-30 ENCOUNTER — Encounter: Payer: Self-pay | Admitting: Internal Medicine

## 2015-01-06 ENCOUNTER — Telehealth: Payer: Self-pay | Admitting: Internal Medicine

## 2015-01-06 NOTE — Telephone Encounter (Signed)
Please call patient regarding lab results °

## 2015-01-09 NOTE — Telephone Encounter (Signed)
See printed letter.

## 2015-01-09 NOTE — Telephone Encounter (Signed)
Will you release the results to me.  I can call pt with any recommendations that you have.

## 2015-01-11 NOTE — Telephone Encounter (Signed)
Contacted pt to see that he has received results. He has and will picking up the K supplement today.

## 2015-01-13 ENCOUNTER — Encounter: Payer: Self-pay | Admitting: Internal Medicine

## 2015-01-17 ENCOUNTER — Ambulatory Visit (HOSPITAL_COMMUNITY): Payer: Medicare Other

## 2015-01-26 ENCOUNTER — Telehealth: Payer: Self-pay | Admitting: Cardiology

## 2015-01-26 ENCOUNTER — Ambulatory Visit (HOSPITAL_COMMUNITY): Admission: RE | Admit: 2015-01-26 | Payer: Medicare Other | Source: Ambulatory Visit

## 2015-01-26 MED ORDER — DIAZEPAM 5 MG PO TABS: ORAL_TABLET | ORAL | Status: DC

## 2015-01-26 NOTE — Telephone Encounter (Signed)
Pt said he was supposed to have a MRI today with Valium. When he arrived,there was no Valium.They said his doctor should have given him the prescription  before the actual test.

## 2015-01-26 NOTE — Telephone Encounter (Signed)
Spoke with pt, aware script was given to the patient at office visit 12-27-14. Patient reports he lost it. New script called into the pharmacy.

## 2015-02-02 ENCOUNTER — Ambulatory Visit: Payer: Medicare Other | Admitting: Internal Medicine

## 2015-02-10 ENCOUNTER — Other Ambulatory Visit: Payer: Self-pay | Admitting: Internal Medicine

## 2015-02-13 ENCOUNTER — Other Ambulatory Visit: Payer: Self-pay | Admitting: Internal Medicine

## 2015-02-14 ENCOUNTER — Other Ambulatory Visit: Payer: Self-pay | Admitting: Internal Medicine

## 2015-02-16 ENCOUNTER — Ambulatory Visit (HOSPITAL_COMMUNITY): Admission: RE | Admit: 2015-02-16 | Payer: Medicare Other | Source: Ambulatory Visit

## 2015-02-23 ENCOUNTER — Other Ambulatory Visit: Payer: Self-pay | Admitting: Internal Medicine

## 2015-03-09 ENCOUNTER — Ambulatory Visit (HOSPITAL_COMMUNITY): Payer: Medicare Other

## 2015-03-14 ENCOUNTER — Ambulatory Visit: Payer: Medicare Other | Admitting: Internal Medicine

## 2015-03-16 ENCOUNTER — Encounter: Payer: Medicare Other | Admitting: Internal Medicine

## 2015-03-20 ENCOUNTER — Ambulatory Visit (HOSPITAL_COMMUNITY): Payer: Medicare Other

## 2015-03-21 ENCOUNTER — Other Ambulatory Visit: Payer: Self-pay | Admitting: Internal Medicine

## 2015-03-21 ENCOUNTER — Telehealth: Payer: Self-pay | Admitting: Internal Medicine

## 2015-03-21 NOTE — Telephone Encounter (Signed)
Called pt and advised him that he is not on Novolog. Pt said he knows, but he has been on Tradjenta. His blood sugars have risen to the 150's to 160's. Pt feels he needs to go back on Novolog. Pt has an appt. Sept. 27th. Please advise about an rx for Novolog.

## 2015-03-21 NOTE — Telephone Encounter (Signed)
OK. Novolog 3-6 units 3x a day. # 1 box of 5 pens # 1 refill

## 2015-03-21 NOTE — Telephone Encounter (Signed)
Pt needs novolog called in please

## 2015-03-22 ENCOUNTER — Telehealth: Payer: Self-pay | Admitting: Internal Medicine

## 2015-03-22 MED ORDER — INSULIN ASPART 100 UNIT/ML FLEXPEN: 3.0000 [IU] | PEN_INJECTOR | Freq: Three times a day (TID) | SUBCUTANEOUS | Status: DC

## 2015-03-22 MED ORDER — INSULIN ASPART 100 UNIT/ML ~~LOC~~ SOLN: SUBCUTANEOUS | Status: DC

## 2015-03-22 MED ORDER — INSULIN PEN NEEDLE 32G X 5 MM MISC: Status: DC

## 2015-03-22 NOTE — Telephone Encounter (Signed)
Pt asking for a call back regarding novolog he needs vials not pens pens are too expensive

## 2015-03-22 NOTE — Addendum Note (Signed)
Addended by: Rockie Neighbours B on: 03/22/2015 09:57 AM   Modules accepted: Orders

## 2015-03-22 NOTE — Telephone Encounter (Signed)
Sent in vial. sbc

## 2015-03-23 ENCOUNTER — Ambulatory Visit (HOSPITAL_COMMUNITY): Admission: RE | Admit: 2015-03-23 | Payer: Medicare Other | Source: Ambulatory Visit

## 2015-03-30 ENCOUNTER — Ambulatory Visit (INDEPENDENT_AMBULATORY_CARE_PROVIDER_SITE_OTHER): Payer: Medicare Other | Admitting: Ophthalmology

## 2015-04-06 ENCOUNTER — Other Ambulatory Visit: Payer: Self-pay | Admitting: Internal Medicine

## 2015-04-07 ENCOUNTER — Telehealth: Payer: Self-pay | Admitting: Internal Medicine

## 2015-04-07 ENCOUNTER — Other Ambulatory Visit: Payer: Self-pay

## 2015-04-07 MED ORDER — ALBUTEROL SULFATE HFA 108 (90 BASE) MCG/ACT IN AERS: 2.0000 | INHALATION_SPRAY | Freq: Four times a day (QID) | RESPIRATORY_TRACT | Status: DC | PRN

## 2015-04-07 NOTE — Telephone Encounter (Signed)
Pt called in and said that with the allergy season he needs a refill on his albuterol (PROVENTIL HFA;VENTOLIN HFA) 108 (90 BASE) MCG/ACT inhaler [[id number]]

## 2015-04-14 MED ORDER — ALBUTEROL SULFATE HFA 108 (90 BASE) MCG/ACT IN AERS: 2.0000 | INHALATION_SPRAY | Freq: Four times a day (QID) | RESPIRATORY_TRACT | Status: DC | PRN

## 2015-04-15 ENCOUNTER — Other Ambulatory Visit: Payer: Self-pay | Admitting: Internal Medicine

## 2015-04-25 ENCOUNTER — Encounter: Payer: Self-pay | Admitting: Internal Medicine

## 2015-04-25 ENCOUNTER — Other Ambulatory Visit (INDEPENDENT_AMBULATORY_CARE_PROVIDER_SITE_OTHER): Payer: Medicare Other | Admitting: *Deleted

## 2015-04-25 ENCOUNTER — Ambulatory Visit (INDEPENDENT_AMBULATORY_CARE_PROVIDER_SITE_OTHER): Payer: Medicare Other | Admitting: Internal Medicine

## 2015-04-25 VITALS — BP 114/68 | HR 58 | Temp 97.6°F | Resp 12 | Wt 198.0 lb

## 2015-04-25 DIAGNOSIS — E1121 Type 2 diabetes mellitus with diabetic nephropathy: Secondary | ICD-10-CM

## 2015-04-25 DIAGNOSIS — Z23 Encounter for immunization: Secondary | ICD-10-CM

## 2015-04-25 LAB — POCT GLYCOSYLATED HEMOGLOBIN (HGB A1C): Hemoglobin A1C: 6.3

## 2015-04-25 NOTE — Progress Notes (Signed)
Patient ID: [name] [name], male   DOB: April 02, 1951, 64 y.o.   MRN: [id number]  HPI: [name] [name] is a 64 y.o.-year-old male, returning for f/u for DM2, dx in 06/2012, insulin-dependent since 09/2012, uncontrolled, with complications (CKD stage 3, mild DR). Last visit 6 mo ago.  Last hemoglobin A1c was: Lab Results  Component Value Date   HGBA1C 6.4 12/29/2014   HGBA1C 7.0* 10/11/2014   HGBA1C 6.6* 06/02/2014   Pt is on a regimen of: - Metformin 500 mg 2x a day with meals - Levemir 12 units at bedtime - Tradjenta 5 mg daily - restarted 01/2014. He has Novolog SSI - target 140, adds 2 units for approx 30 mg/dL - rarely uses it He had to stop Tradjenta 5 mg daily in the past as his [employer] did not cover it. Pays 47$ for 1 mo (tier 3). He heard about Victoza, but it is not very eager to try newer medicines.  He checks sugars 3x a day and they are great: - am:101-120 >> 101-123 >> 90-130 >> 97-110 >> 101-110 >> 92-113 - 1h after b'fast: 105-168 >> n/c >> <150 >> max 120 >> n/c - before lunch: 90-131, but one high at 183 >> 97-147 >> 90-150 >> n/c >> 116-117 >> 95-115 - 2h after lunch: up to 130-140 >> 98-110 - before dinner: 122-198 >> 101-173 >> 117-161 >> 112-132 >> 117-130 >> 88-115 - 2h after dinner: 122-131 >> n/c >> 160-170 >> 120-140 >> <140 >> n/c - bedtime: 119-164 >> not checking No lows. Lowest sugar was 82 >> 78 >> 88; does not know if has hypoglycemia awareness. Highest sugar was 160 >> 200 x2 - overate >> 208.  Pt has chronic kidney disease, last BUN/creatinine was:  Lab Results  Component Value Date   BUN 15 12/29/2014   CREATININE 1.43 12/29/2014  Last ACR 0.6 12/2012. Last set of lipids: Lab Results  Component Value Date   CHOL 200 12/29/2014   HDL 48.30 12/29/2014   LDLCALC 124* 12/29/2014   LDLDIRECT 128.1 03/15/2014   TRIG 137.0 12/29/2014   CHOLHDL 4 12/29/2014  He restarted Crestor 5 mg 1x a week.  Pt's last eye exam was 03/29/2014. dr Zigmund Daniel . 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 Denies numbness and tingling in his legs.   He also has a history of nonischemic cardiomyopathy, with 2-D echo showing an EF of 93-81%, grade 1 diastolic dysfunction, severe LV H, normal LV size and mildly dilated LA. He also has hyperlipidemia, history of atrial fibrillation, COPD and GERD. No h/o pancreatitis.   I reviewed pt's medications, allergies, PMH, social hx, family hx, and changes were documented in the history of present illness. Otherwise, unchanged from my initial visit note. Started Norvasc.  ROS: Constitutional: no weight gain, no fatigue, no subjective hyperthermia/hypothermia Eyes: no blurry vision, no xerophthalmia ENT: no sore throat, no nodules palpated in throat, no dysphagia/odynophagia, no hoarseness Cardiovascular: no CP/SOB/palpitations/leg swelling Respiratory: no cough/SOB, no wheezing Gastrointestinal: no N/V/D/C/heartburn Musculoskeletal: no muscle/ joint aches Skin: no rashes Neurological: no tremors/numbness/tingling/dizziness  PE: BP 114/68 mmHg  Pulse 58  Temp(Src) 97.6 F (36.4 C) (Oral)  Resp 12  Wt 198 lb (89.812 kg)  SpO2 96% Body mass index is 28.41 kg/(m^2). Wt Readings from Last 3 Encounters:  04/25/15 198 lb (89.812 kg)  12/29/14 199 lb (90.266 kg)  12/27/14 203 lb (92.08 kg)   Constitutional: overweight, in NAD Eyes: PERRLA, EOMI, no exophthalmos ENT: moist mucous membranes, no  thyromegaly, no cervical lymphadenopathy Cardiovascular: RRR, No MRG Respiratory: CTA B Gastrointestinal: abdomen soft, NT, ND, BS+ Musculoskeletal: no deformities, strength intact in all 4 Skin: moist, warm, no rashes Neurological: no tremor with outstretched hands, DTR normal in all 4  ASSESSMENT: 1. DM2, insulin-dependent, uncontrolled, with complications - CKD stage III - mild DR  He sees Dr Stanford Breed for Afib and cardiomyopathy (LV Htf)  PLAN:  1. Patient with improved diabetes control after  addition of Tradjenta. Will continue the same regimen. - I advised him to: Patient Instructions  Please continue: - Metformin 500 mg 2x a day with meals   - Levemir 12 units at bedtime - Tradjenta 5 mg in am  Please return in 4 months with your sugar log.   - will check a hemoglobin A1c today >> 6.3% (better) - up to date with eye exams - continue to check sugars 1-2 times a day - I will see him back in 4 months with his sugar log

## 2015-04-25 NOTE — Patient Instructions (Signed)
Please continue: - Metformin 500 mg 2x a day with meals   - Levemir 12 units at bedtime - Tradjenta 5 mg in am  Please return in 3 months with your sugar log.

## 2015-05-04 ENCOUNTER — Other Ambulatory Visit: Payer: Self-pay | Admitting: Internal Medicine

## 2015-05-08 ENCOUNTER — Telehealth: Payer: Self-pay | Admitting: *Deleted

## 2015-05-08 NOTE — Telephone Encounter (Signed)
Pt called stating that he needed a refill of Levemir. He stated that he is on a sliding scale with it. Pt stated he is taking 12-25 units depending on his blood sugar before bedtime (b/c he is not on the Novolog any longer). I advised pt that I cannot approve a refill with this dosage w/out consulting with Dr Cruzita Lederer. Pt stated that if the dosage (sig) said 12-25 units his insulin is $45 a month. If not it is $125 a month. Please review and advise. Thank you.

## 2015-05-09 MED ORDER — INSULIN DETEMIR 100 UNIT/ML ~~LOC~~ SOLN: 15.0000 [IU] | Freq: Every day | SUBCUTANEOUS | Status: DC

## 2015-05-09 NOTE — Telephone Encounter (Signed)
I cannot send it as a range, especially if the range is this wide! The dose in the chart and the Rx should reflect what he is actually taking at home most days - which is the 12 units.Marland KitchenMarland Kitchen

## 2015-05-09 NOTE — Telephone Encounter (Signed)
Spoke with Dr Cruzita Lederer. She advised that pt can move up to 15 units. Called pt and advised him per Dr Cruzita Lederer, pt voiced understanding. Refill with new dosage sent to pt's pharmacy.

## 2015-05-29 ENCOUNTER — Other Ambulatory Visit: Payer: Self-pay | Admitting: Internal Medicine

## 2015-06-14 ENCOUNTER — Other Ambulatory Visit: Payer: Self-pay | Admitting: Internal Medicine

## 2015-06-29 ENCOUNTER — Other Ambulatory Visit: Payer: Self-pay | Admitting: Internal Medicine

## 2015-07-10 ENCOUNTER — Other Ambulatory Visit: Payer: Self-pay | Admitting: Internal Medicine

## 2015-07-11 ENCOUNTER — Other Ambulatory Visit: Payer: Self-pay | Admitting: *Deleted

## 2015-07-11 MED ORDER — GLUCOSE BLOOD VI STRP: ORAL_STRIP | Status: DC

## 2015-07-14 ENCOUNTER — Other Ambulatory Visit: Payer: Self-pay | Admitting: *Deleted

## 2015-08-24 ENCOUNTER — Ambulatory Visit (INDEPENDENT_AMBULATORY_CARE_PROVIDER_SITE_OTHER): Payer: Medicare Other | Admitting: Internal Medicine

## 2015-08-24 ENCOUNTER — Other Ambulatory Visit (INDEPENDENT_AMBULATORY_CARE_PROVIDER_SITE_OTHER): Payer: Medicare Other | Admitting: *Deleted

## 2015-08-24 ENCOUNTER — Encounter: Payer: Self-pay | Admitting: Internal Medicine

## 2015-08-24 VITALS — BP 128/74 | HR 59 | Temp 97.4°F | Resp 12 | Wt 204.0 lb

## 2015-08-24 DIAGNOSIS — Z794 Long term (current) use of insulin: Secondary | ICD-10-CM

## 2015-08-24 DIAGNOSIS — E1121 Type 2 diabetes mellitus with diabetic nephropathy: Secondary | ICD-10-CM | POA: Diagnosis not present

## 2015-08-24 LAB — POCT GLYCOSYLATED HEMOGLOBIN (HGB A1C): HEMOGLOBIN A1C: 6.6

## 2015-08-24 NOTE — Progress Notes (Signed)
Patient ID: [name] [name], male   DOB: 07-07-1951, 65 y.o.   MRN: [id number]  HPI: Greogory Slaby is a 65 y.o.-year-old male, returning for f/u for DM2, dx in 06/2012, insulin-dependent since 09/2012, uncontrolled, with complications (CKD stage 3, mild DR). Last visit 4 mo ago.  He gained 6 lbs over the Holidays.  Last hemoglobin A1c was: Lab Results  Component Value Date   HGBA1C 6.3 04/25/2015   HGBA1C 6.4 12/29/2014   HGBA1C 7.0* 10/11/2014   Pt is on a regimen of: - Metformin 500 mg 2x a day with meals - Levemir 12-15 units at bedtime - Tradjenta 5 mg daily - restarted 01/2014. He has Novolog SSI - target 140, adds 2 units for approx 30 mg/dL - rarely uses it He had to stop Tradjenta 5 mg daily in the past as his [employer] did not cover it. Pays 47$ for 1 mo (tier 3). He heard about Victoza, but it is not very eager to try newer medicines.  He checks sugars 3x a day and they are great: - am:101-120 >> 101-123 >> 90-130 >> 97-110 >> 101-110 >> 92-113 >> 78-148 - 1h after b'fast: 105-168 >> n/c >> <150 >> max 120 >> n/c - before lunch: 97-147 >> 90-150 >> n/c >> 116-117 >> 95-115 >> 80-130 (juices for lunch) - 2h after lunch: up to 130-140 >> 98-110 - before dinner: 122-198 >> 101-173 >> 117-161 >> 112-132 >> 117-130 >> 88-115 >> 120s - 2h after dinner: 122-131 >> n/c >> 160-170 >> 120-140 >> <140 >> n/c - bedtime: 119-164 >> not checking >> 120-130 No lows. Lowest sugar was 82 >> 78 >> 88 >> 78; does not know if has hypoglycemia awareness. Highest sugar was 160 >> 200 x2 - overate >> 208 >> 148.  Pt has chronic kidney disease, last BUN/creatinine was:  Lab Results  Component Value Date   BUN 15 12/29/2014   CREATININE 1.43 12/29/2014  Last ACR 0.6 12/2012. Last set of lipids: Lab Results  Component Value Date   CHOL 200 12/29/2014   HDL 48.30 12/29/2014   LDLCALC 124* 12/29/2014   LDLDIRECT 128.1 03/15/2014   TRIG 137.0 12/29/2014   CHOLHDL 4 12/29/2014  He restarted  Crestor 5 mg.  Pt's last eye exam was 03/29/2014. dr Zigmund Daniel . 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 Denies numbness and tingling in his legs.   He also has a history of nonischemic cardiomyopathy, with 2-D echo showing an EF of [id number], grade 1 diastolic dysfunction, severe LV H, normal LV size and mildly dilated LA. He also has hyperlipidemia, history of atrial fibrillation, COPD and GERD. No h/o pancreatitis.   I reviewed pt's medications, allergies, PMH, social hx, family hx, and changes were documented in the history of present illness. Otherwise, unchanged from my initial visit note. Started Norvasc.  ROS: Constitutional: + weight gain, no fatigue, no subjective hyperthermia/hypothermia Eyes: no blurry vision, no xerophthalmia ENT: no sore throat, no nodules palpated in throat, no dysphagia/odynophagia, no hoarseness Cardiovascular: no CP/SOB/palpitations/leg swelling Respiratory: no cough/SOB, no wheezing Gastrointestinal: no N/V/D/C/heartburn Musculoskeletal: no muscle/ joint aches Skin: no rashes Neurological: no tremors/numbness/tingling/dizziness  PE: BP 128/74 mmHg  Pulse 59  Temp(Src) 97.4 F (36.3 C) (Oral)  Resp 12  Wt 204 lb (92.534 kg)  SpO2 94% Body mass index is 29.27 kg/(m^2). Wt Readings from Last 3 Encounters:  08/24/15 204 lb (92.534 kg)  04/25/15 198 lb (89.812 kg)  12/29/14 199 lb (90.266 kg)   Constitutional:  overweight, in NAD Eyes: PERRLA, EOMI, no exophthalmos ENT: moist mucous membranes, no thyromegaly, no cervical lymphadenopathy Cardiovascular: RRR, No MRG Respiratory: CTA B Gastrointestinal: abdomen soft, NT, ND, BS+ Musculoskeletal: no deformities, strength intact in all 4 Skin: moist, warm, no rashes Neurological: no tremor with outstretched hands, DTR normal in all 4 Foot exam performed today Diabetic Foot Exam - Simple   Simple Foot Form  Diabetic Foot exam was performed with the following findings:  Yes 08/24/2015  8:16  AM  Visual Inspection  See comments:  Yes  Sensation Testing  Intact to touch and monofilament testing bilaterally:  Yes  Pulse Check  Posterior Tibialis and Dorsalis pulse intact bilaterally:  Yes  Comments  + calluses medial part of big toes and front half of soles       ASSESSMENT: 1. DM2, insulin-dependent, uncontrolled, with complications - CKD stage III - mild DR  He sees Dr Stanford Breed for Afib and cardiomyopathy (LV Htf)  PLAN:  1. Patient with improved diabetes control after addition of Tradjenta. Will continue the same regimen as sugars are still at goal, despite the Holidays and his weight gain. - I advised him to: Patient Instructions  Please continue: - Metformin 500 mg 2x a day with meals   - Levemir 12-15 units at bedtime - Tradjenta 5 mg in am  Please return in 4 months with your sugar log.   - will check a hemoglobin A1c today >> 6.6% (still great!) - advised to schedule a new eye exam, he is due - continue to check sugars 1-2 times a day - I will see him back in 4 months with his sugar log

## 2015-08-24 NOTE — Addendum Note (Signed)
Addended by: Rockie Neighbours B on: 08/24/2015 08:22 AM   Modules accepted: Medications

## 2015-08-24 NOTE — Patient Instructions (Signed)
Please continue: - Metformin 500 mg 2x a day with meals   - Levemir 12-15 units at bedtime - Tradjenta 5 mg in am  Please return in 4 months with your sugar log.

## 2015-08-27 ENCOUNTER — Other Ambulatory Visit: Payer: Self-pay | Admitting: Internal Medicine

## 2015-08-29 ENCOUNTER — Telehealth: Payer: Self-pay | Admitting: *Deleted

## 2015-08-29 NOTE — Telephone Encounter (Signed)
Pt called and lvm about the foot exam form. Pt stated that it should be faxed to [phone]; Attn: Bertram Millard ([employer]). Dr Cruzita Lederer should advise to the condition of his feet, so he can receive the diabetic shoes he needs. I am unsure if this is something he gave to Dr Cruzita Lederer during his visit or if the form will be faxed to our office. Please advise.

## 2015-08-29 NOTE — Telephone Encounter (Signed)
Please have them send me the blank form.

## 2015-08-31 NOTE — Telephone Encounter (Signed)
Faxing a request to [employer] to send [place] the blank form that is needed to be filled out.

## 2015-09-03 ENCOUNTER — Other Ambulatory Visit: Payer: Self-pay | Admitting: Internal Medicine

## 2015-09-04 ENCOUNTER — Encounter: Payer: Self-pay | Admitting: Internal Medicine

## 2015-09-04 ENCOUNTER — Other Ambulatory Visit: Payer: Self-pay | Admitting: Internal Medicine

## 2015-09-04 ENCOUNTER — Other Ambulatory Visit (INDEPENDENT_AMBULATORY_CARE_PROVIDER_SITE_OTHER): Payer: Medicare Other

## 2015-09-04 ENCOUNTER — Ambulatory Visit (INDEPENDENT_AMBULATORY_CARE_PROVIDER_SITE_OTHER): Payer: Medicare Other | Admitting: Internal Medicine

## 2015-09-04 VITALS — BP 150/88 | HR 60 | Temp 97.5°F | Resp 16 | Ht 70.0 in | Wt 206.2 lb

## 2015-09-04 DIAGNOSIS — Z794 Long term (current) use of insulin: Secondary | ICD-10-CM

## 2015-09-04 DIAGNOSIS — E1121 Type 2 diabetes mellitus with diabetic nephropathy: Secondary | ICD-10-CM

## 2015-09-04 DIAGNOSIS — E785 Hyperlipidemia, unspecified: Secondary | ICD-10-CM | POA: Diagnosis not present

## 2015-09-04 DIAGNOSIS — E876 Hypokalemia: Secondary | ICD-10-CM

## 2015-09-04 DIAGNOSIS — Z23 Encounter for immunization: Secondary | ICD-10-CM

## 2015-09-04 DIAGNOSIS — I10 Essential (primary) hypertension: Secondary | ICD-10-CM | POA: Diagnosis not present

## 2015-09-04 DIAGNOSIS — J301 Allergic rhinitis due to pollen: Secondary | ICD-10-CM

## 2015-09-04 DIAGNOSIS — I480 Paroxysmal atrial fibrillation: Secondary | ICD-10-CM | POA: Diagnosis not present

## 2015-09-04 DIAGNOSIS — Z1159 Encounter for screening for other viral diseases: Secondary | ICD-10-CM | POA: Diagnosis not present

## 2015-09-04 LAB — COMPREHENSIVE METABOLIC PANEL
ALT: 10 U/L (ref 0–53)
AST: 15 U/L (ref 0–37)
Albumin: 4.1 g/dL (ref 3.5–5.2)
Alkaline Phosphatase: 79 U/L (ref 39–117)
BUN: 15 mg/dL (ref 6–23)
CALCIUM: 9.5 mg/dL (ref 8.4–10.5)
CHLORIDE: 101 meq/L (ref 96–112)
CO2: 31 meq/L (ref 19–32)
Creatinine, Ser: 1.3 mg/dL (ref 0.40–1.50)
GFR: 70.91 mL/min (ref >60.00)
Glucose, Bld: 114 mg/dL — ABNORMAL HIGH (ref 70–99)
POTASSIUM: 4 meq/L (ref 3.5–5.1)
Sodium: 141 mEq/L (ref 135–145)
Total Bilirubin: 0.4 mg/dL (ref 0.2–1.2)
Total Protein: 8.2 g/dL (ref 6.0–8.3)

## 2015-09-04 LAB — CBC WITH DIFFERENTIAL/PLATELET
BASOS ABS: 0.1 10*3/uL (ref 0.0–0.1)
BASOS PCT: 0.6 % (ref 0.0–3.0)
EOS ABS: 0.6 10*3/uL (ref 0.0–0.7)
Eosinophils Relative: 5.8 % — ABNORMAL HIGH (ref 0.0–5.0)
HCT: 41 % (ref 39.0–52.0)
HEMOGLOBIN: 13.6 g/dL (ref 13.0–17.0)
LYMPHS PCT: 25.7 % (ref 12.0–46.0)
Lymphs Abs: 2.6 10*3/uL (ref 0.7–4.0)
MCHC: 33.1 g/dL (ref 30.0–36.0)
MCV: 94.7 fl (ref 78.0–100.0)
MONO ABS: 0.7 10*3/uL (ref 0.1–1.0)
MONOS PCT: 6.7 % (ref 3.0–12.0)
Neutro Abs: 6.2 10*3/uL (ref 1.4–7.7)
Neutrophils Relative %: 61.2 % (ref 43.0–77.0)
PLATELETS: 315 10*3/uL (ref 150.0–400.0)
RBC: 4.33 Mil/uL (ref 4.22–5.81)
RDW: 14.2 % (ref 11.5–15.5)
WBC: 10.1 10*3/uL (ref 4.0–10.5)

## 2015-09-04 LAB — HEPATITIS C ANTIBODY: HCV AB: NEGATIVE

## 2015-09-04 LAB — LIPID PANEL
CHOL/HDL RATIO: 5
CHOLESTEROL: 181 mg/dL (ref 0–200)
HDL: 38.6 mg/dL — ABNORMAL LOW (ref >39.00)
NONHDL: 142.84
Triglycerides: 288 mg/dL — ABNORMAL HIGH (ref 0.0–149.0)
VLDL: 57.6 mg/dL — AB (ref 0.0–40.0)

## 2015-09-04 LAB — TSH: TSH: 2.72 u[IU]/mL (ref 0.35–4.50)

## 2015-09-04 LAB — LDL CHOLESTEROL, DIRECT: LDL DIRECT: 106 mg/dL

## 2015-09-04 MED ORDER — CETIRIZINE HCL 10 MG PO TBDP: 1.0000 | ORAL_TABLET | Freq: Every day | ORAL | Status: DC

## 2015-09-04 NOTE — Progress Notes (Signed)
Subjective:  Patient ID: [name] [name], male    DOB: 1951-05-01  Age: 65 y.o. MRN: [id number]  CC: Allergic Rhinitis ; Hypertension; Hyperlipidemia; and Diabetes   HPI [name] [name] presents for follow-up on multiple medical problems. He complains of about a 15 pound weight gain. He also complains of a chronic runny nose and postnasal drip. He is not willing to use a nasal spray. He is not currently taking any antihistamines and has contraindications to decongestants. He recently saw his endocrinologist and his A1c was down to 6.6% indicating good blood sugar control.  Outpatient Prescriptions Prior to Visit  Medication Sig Dispense Refill  . albuterol (PROVENTIL HFA;VENTOLIN HFA) 108 (90 BASE) MCG/ACT inhaler Inhale 2 puffs into the lungs every 6 (six) hours as needed for wheezing. 18 g 5  . amLODipine (NORVASC) 5 MG tablet Take 1 tablet (5 mg total) by mouth daily. 90 tablet 3  . aspirin 81 MG chewable tablet Chew 81 mg by mouth daily.    . fish oil-omega-3 fatty acids 1000 MG capsule Take 2 g by mouth daily.     Marland Kitchen glucose blood (ONE TOUCH ULTRA TEST) test strip USE TO CHECK BLOOD SUGAR THREE TIMES DAILY AS INSTRUCTED. DX: E11.21 300 each 1  . insulin aspart (NOVOLOG) 100 UNIT/ML injection Inject 3-6 units into the skin 3 times daily. 1 vial 1  . insulin detemir (LEVEMIR) 100 UNIT/ML injection Inject 0.15 mLs (15 Units total) into the skin daily. 10 mL 1  . losartan (COZAAR) 100 MG tablet TAKE ONE TABLET BY MOUTH ONCE DAILY 90 tablet 1  . metFORMIN (GLUCOPHAGE) 500 MG tablet TAKE ONE TABLET BY MOUTH TWICE DAILY WITH A MEAL 60 tablet 0  . metoprolol succinate (TOPROL-XL) 50 MG 24 hr tablet TAKE ONE TABLET BY MOUTH ONCE DAILY WITH OR IMMEDIATELY FOLLOWING A MEAL 90 tablet 1  . omeprazole (PRILOSEC) 20 MG capsule TAKE ONE CAPSULE BY MOUTH ONCE DAILY 90 capsule 3  . RELION INSULIN SYR 0.3ML/31G 31G X 5/16" 0.3 ML MISC USE AS DIRECTED 4 TIMES DAILY 200 each 4  . rosuvastatin (CRESTOR) 5 MG  tablet Take 5 mg by mouth 3 (three) times a week.    . TRADJENTA 5 MG TABS tablet TAKE ONE TABLET BY MOUTH ONCE DAILY **PATIENT NEEDS TO SCHEDULE FOLLOW UP WITH DR GHERGHE** 30 tablet 0  . hydrochlorothiazide (HYDRODIURIL) 25 MG tablet TAKE ONE TABLET BY MOUTH ONCE DAILY 90 tablet 1  . diazepam (VALIUM) 5 MG tablet TAKE 30 MIN TO 1 HOUR PRIOR TO PROCEDURE (Patient not taking: Reported on 09/04/2015) 2 tablet 0  . LEVEMIR 100 UNIT/ML injection INJECT 15 UNITS (0.15MLS) INTO THE SKIN DAILY 10 mL 2  . metFORMIN (GLUCOPHAGE) 500 MG tablet TAKE ONE TABLET BY MOUTH TWICE DAILY WITH A MEAL 60 tablet 1   No facility-administered medications prior to visit.    ROS Review of Systems  Constitutional: Positive for unexpected weight change. Negative for fever, chills, diaphoresis, appetite change and fatigue.  HENT: Positive for postnasal drip and rhinorrhea. Negative for congestion, nosebleeds, sinus pressure, sore throat, tinnitus, trouble swallowing and voice change.   Eyes: Negative.   Respiratory: Negative.  Negative for cough, choking, chest tightness, shortness of breath and stridor.   Cardiovascular: Negative.  Negative for chest pain, palpitations and leg swelling.  Gastrointestinal: Negative.  Negative for nausea, vomiting, abdominal pain, diarrhea, constipation and blood in stool.  Endocrine: Negative.  Negative for polydipsia, polyphagia and polyuria.  Genitourinary: Negative.   Musculoskeletal: Negative.  Negative for myalgias, back pain, joint swelling, arthralgias and neck pain.  Skin: Negative.  Negative for color change and rash.  Allergic/Immunologic: Negative.   Neurological: Negative.  Negative for dizziness, syncope, weakness, light-headedness, numbness and headaches.  Hematological: Negative.  Negative for adenopathy. Does not bruise/bleed easily.  Psychiatric/Behavioral: Negative.     Objective:  BP 150/88 mmHg  Pulse 60  Temp(Src) 97.5 F (36.4 C) (Oral)  Resp 16  Ht 5\' 10"   (1.778 m)  Wt 206 lb 4 oz (93.554 kg)  BMI 29.59 kg/m2  SpO2 98%  BP Readings from Last 3 Encounters:  09/04/15 150/88  08/24/15 128/74  04/25/15 114/68    Wt Readings from Last 3 Encounters:  09/04/15 206 lb 4 oz (93.554 kg)  08/24/15 204 lb (92.534 kg)  04/25/15 198 lb (89.812 kg)    Physical Exam  Constitutional: He is oriented to person, place, and time. He appears well-developed and well-nourished. No distress.  HENT:  Head: Normocephalic and atraumatic.  Nose: Mucosal edema and rhinorrhea present. No sinus tenderness or nasal septal hematoma. No epistaxis. Right sinus exhibits no maxillary sinus tenderness and no frontal sinus tenderness. Left sinus exhibits no maxillary sinus tenderness and no frontal sinus tenderness.  Mouth/Throat: Oropharynx is clear and moist and mucous membranes are normal. Mucous membranes are not pale, not dry and not cyanotic. No oral lesions. No trismus in the jaw. No uvula swelling. No oropharyngeal exudate, posterior oropharyngeal edema, posterior oropharyngeal erythema or tonsillar abscesses.  Eyes: Conjunctivae are normal. Right eye exhibits no discharge. Left eye exhibits no discharge. No scleral icterus.  Neck: Normal range of motion. Neck supple. No JVD present. No tracheal deviation present. No thyromegaly present.  Cardiovascular: Normal rate, regular rhythm, normal heart sounds and intact distal pulses.  Exam reveals no gallop and no friction rub.   No murmur heard. Pulmonary/Chest: Effort normal and breath sounds normal. No stridor. No respiratory distress. He has no wheezes. He has no rales. He exhibits no tenderness.  Abdominal: Soft. Bowel sounds are normal. He exhibits no distension and no mass. There is no tenderness. There is no rebound and no guarding.  Musculoskeletal: Normal range of motion. He exhibits no edema or tenderness.  Lymphadenopathy:    He has no cervical adenopathy.  Neurological: He is oriented to person, place, and  time.  Skin: Skin is warm and dry. No rash noted. He is not diaphoretic. No erythema. No pallor.  Psychiatric: He has a normal mood and affect. His behavior is normal. Judgment and thought content normal.  Vitals reviewed.   Lab Results  Component Value Date   WBC 10.1 09/04/2015   HGB 13.6 09/04/2015   HCT 41.0 09/04/2015   PLT 315.0 09/04/2015   GLUCOSE 114* 09/04/2015   CHOL 181 09/04/2015   TRIG 288.0* 09/04/2015   HDL 38.60* 09/04/2015   LDLDIRECT 106.0 09/04/2015   LDLCALC 124* 12/29/2014   ALT 10 09/04/2015   AST 15 09/04/2015   NA 141 09/04/2015   K 4.0 09/04/2015   CL 101 09/04/2015   CREATININE 1.30 09/04/2015   BUN 15 09/04/2015   CO2 31 09/04/2015   TSH 2.72 09/04/2015   PSA 1.28 03/15/2014   INR 0.91 10/19/2012   HGBA1C 6.6 08/24/2015   MICROALBUR 7.3* 12/29/2014    No results found.  Assessment & Plan:   Jacobson was seen today for allergic rhinitis , hypertension, hyperlipidemia and diabetes.  Diagnoses and all orders for this visit:  Essential hypertension, benign- his blood  pressure is well-controlled, electrolytes and renal function are stable. -     Comprehensive metabolic panel; Future -     CBC with Differential/Platelet; Future  Type 2 diabetes mellitus with diabetic nephropathy, with long-term current use of insulin ([place])- his recent A1c shows excellent blood sugar control, he will continue his current regimen -     Lipid panel; Future -     Comprehensive metabolic panel; Future -     CBC with Differential/Platelet; Future  Hypokalemia- this has been caused by his diuretic therapy, his current potassium level is normal, he will remain off potassium supplements for now. -     Comprehensive metabolic panel; Future -     CBC with Differential/Platelet; Future  Hyperlipidemia with target LDL less than 100- he is achieved his LDL goal is doing well on the statin therapy. -     Lipid panel; Future -     Comprehensive metabolic panel; Future -      TSH; Future  Need for prophylactic vaccination with combined diphtheria-tetanus-pertussis (DTP) vaccine -     Tdap vaccine greater than or equal to 7yo IM  Paroxysmal atrial fibrillation ([place])- he has maintained good rate and rhythm control. -     CBC with Differential/Platelet; Future -     TSH; Future  Need for hepatitis C screening test -     Hepatitis C antibody; Future  Allergic rhinitis due to pollen- will start a systemic antihistamine for this since he is not willing to use a nasal spray. -     Cetirizine HCl 10 MG TBDP; Take 1 tablet by mouth daily.  I have discontinued Mr. Hauptmann diazepam and LEVEMIR. I am also having him start on Cetirizine HCl. Additionally, I am having him maintain his fish oil-omega-3 fatty acids, aspirin, amLODipine, omeprazole, insulin aspart, albuterol, metoprolol succinate, metFORMIN, TRADJENTA, insulin detemir, RELION INSULIN SYR 0.3ML/31G, glucose blood, rosuvastatin, and losartan.  Meds ordered this encounter  Medications  . Cetirizine HCl 10 MG TBDP    Sig: Take 1 tablet by mouth daily.    Dispense:  90 tablet    Refill:  3     Follow-up: Return in about 6 months (around 03/03/2016).  Scarlette Calico, MD

## 2015-09-04 NOTE — Patient Instructions (Signed)
Hypertension Hypertension, commonly called high blood pressure, is when the force of blood pumping through your arteries is too strong. Your arteries are the blood vessels that carry blood from your heart throughout your body. A blood pressure reading consists of a higher number over a lower number, such as 110/72. The higher number (systolic) is the pressure inside your arteries when your heart pumps. The lower number (diastolic) is the pressure inside your arteries when your heart relaxes. Ideally you want your blood pressure below 120/80. Hypertension forces your heart to work harder to pump blood. Your arteries may become narrow or stiff. Having untreated or uncontrolled hypertension can cause heart attack, stroke, kidney disease, and other problems. RISK FACTORS Some risk factors for high blood pressure are controllable. Others are not.  Risk factors you cannot control include:   Race. You may be at higher risk if you are African American.  Age. Risk increases with age.  Gender. Men are at higher risk than women before age 45 years. After age 65, women are at higher risk than men. Risk factors you can control include:  Not getting enough exercise or physical activity.  Being overweight.  Getting too much fat, sugar, calories, or salt in your diet.  Drinking too much alcohol. SIGNS AND SYMPTOMS Hypertension does not usually cause signs or symptoms. Extremely high blood pressure (hypertensive crisis) may cause headache, anxiety, shortness of breath, and nosebleed. DIAGNOSIS To check if you have hypertension, your health care provider will measure your blood pressure while you are seated, with your arm held at the level of your heart. It should be measured at least twice using the same arm. Certain conditions can cause a difference in blood pressure between your right and left arms. A blood pressure reading that is higher than normal on one occasion does not mean that you need treatment. If  it is not clear whether you have high blood pressure, you may be asked to return on a different day to have your blood pressure checked again. Or, you may be asked to monitor your blood pressure at home for 1 or more weeks. TREATMENT Treating high blood pressure includes making lifestyle changes and possibly taking medicine. Living a healthy lifestyle can help lower high blood pressure. You may need to change some of your habits. Lifestyle changes may include:  Following the DASH diet. This diet is high in fruits, vegetables, and whole grains. It is low in salt, red meat, and added sugars.  Keep your sodium intake below 2,300 mg per day.  Getting at least 30-45 minutes of aerobic exercise at least 4 times per week.  Losing weight if necessary.  Not smoking.  Limiting alcoholic beverages.  Learning ways to reduce stress. Your health care provider may prescribe medicine if lifestyle changes are not enough to get your blood pressure under control, and if one of the following is true:  You are 18-59 years of age and your systolic blood pressure is above 140.  You are 60 years of age or older, and your systolic blood pressure is above 150.  Your diastolic blood pressure is above 90.  You have diabetes, and your systolic blood pressure is over 140 or your diastolic blood pressure is over 90.  You have kidney disease and your blood pressure is above 140/90.  You have heart disease and your blood pressure is above 140/90. Your personal target blood pressure may vary depending on your medical conditions, your age, and other factors. HOME CARE INSTRUCTIONS    Have your blood pressure rechecked as directed by your health care provider.   Take medicines only as directed by your health care provider. Follow the directions carefully. Blood pressure medicines must be taken as prescribed. The medicine does not work as well when you skip doses. Skipping doses also puts you at risk for  problems.  Do not smoke.   Monitor your blood pressure at home as directed by your health care provider. SEEK MEDICAL CARE IF:   You think you are having a reaction to medicines taken.  You have recurrent headaches or feel dizzy.  You have swelling in your ankles.  You have trouble with your vision. SEEK IMMEDIATE MEDICAL CARE IF:  You develop a severe headache or confusion.  You have unusual weakness, numbness, or feel faint.  You have severe chest or abdominal pain.  You vomit repeatedly.  You have trouble breathing. MAKE SURE YOU:   Understand these instructions.  Will watch your condition.  Will get help right away if you are not doing well or get worse.   This information is not intended to replace advice given to you by your health care provider. Make sure you discuss any questions you have with your health care provider.   Document Released: 07/15/2005 Document Revised: 11/29/2014 Document Reviewed: 05/07/2013 Elsevier Interactive Patient Education 2016 [employer]vier Inc.  

## 2015-09-04 NOTE — Telephone Encounter (Signed)
Forms completed and faxed to Neoma Laming at [employer].

## 2015-09-04 NOTE — Progress Notes (Signed)
Pre visit review using our clinic review tool, if applicable. No additional management support is needed unless otherwise documented below in the visit note. 

## 2015-09-09 ENCOUNTER — Other Ambulatory Visit: Payer: Self-pay | Admitting: Internal Medicine

## 2015-09-29 ENCOUNTER — Other Ambulatory Visit: Payer: Self-pay | Admitting: *Deleted

## 2015-09-29 MED ORDER — LINAGLIPTIN 5 MG PO TABS: ORAL_TABLET | ORAL | Status: DC

## 2015-10-05 ENCOUNTER — Telehealth: Payer: Self-pay | Admitting: Internal Medicine

## 2015-10-05 NOTE — Telephone Encounter (Signed)
Called pt and advised him that the form was completed and mailed back. Pt will contact [facility] tomorrow to find out more info.

## 2015-10-05 NOTE — Telephone Encounter (Signed)
Pt requesting call back regarding Diabetic Shoes CB# [phone]

## 2015-10-06 ENCOUNTER — Telehealth: Payer: Self-pay | Admitting: Internal Medicine

## 2015-10-06 NOTE — Telephone Encounter (Signed)
Pt called in to give [place] fax # [facility] [phone] and needs his chart orders faxed there for his diabetic shoes

## 2015-10-06 NOTE — Telephone Encounter (Signed)
Faxed order and chart note to [facility] at [phone] (for the third time).

## 2015-10-10 ENCOUNTER — Telehealth: Payer: Self-pay | Admitting: Internal Medicine

## 2015-10-10 NOTE — Telephone Encounter (Signed)
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

## 2015-10-10 NOTE — Telephone Encounter (Signed)
Pt requests CB from you, he said it is regarding his Dr. Alice Reichert, [place]

## 2015-10-12 NOTE — Telephone Encounter (Signed)
Tried to call pt. Was unable to lvm.

## 2015-11-12 ENCOUNTER — Other Ambulatory Visit: Payer: Self-pay | Admitting: Internal Medicine

## 2015-12-11 ENCOUNTER — Other Ambulatory Visit: Payer: Self-pay | Admitting: Internal Medicine

## 2015-12-22 ENCOUNTER — Ambulatory Visit: Payer: Medicare Other | Admitting: Internal Medicine

## 2016-01-18 ENCOUNTER — Other Ambulatory Visit: Payer: Self-pay | Admitting: Internal Medicine

## 2016-01-20 ENCOUNTER — Other Ambulatory Visit: Payer: Self-pay | Admitting: Internal Medicine

## 2016-02-07 ENCOUNTER — Other Ambulatory Visit: Payer: Self-pay | Admitting: Internal Medicine

## 2016-02-08 ENCOUNTER — Other Ambulatory Visit: Payer: Self-pay

## 2016-02-08 ENCOUNTER — Telehealth: Payer: Self-pay | Admitting: Internal Medicine

## 2016-02-08 MED ORDER — GLUCOSE BLOOD VI STRP: ORAL_STRIP | Status: DC

## 2016-02-08 NOTE — Telephone Encounter (Signed)
Received a call from pharmacy to give ICD 10 code for patient to receive his test strips. Resubmitted RX request with code.

## 2016-02-08 NOTE — Telephone Encounter (Signed)
Please refill the glucose test strips [facility] asap he is completely out

## 2016-02-08 NOTE — Telephone Encounter (Signed)
PT stated pharmacy needs script for test strips sent in with DX code.  He is heading there now

## 2016-02-08 NOTE — Telephone Encounter (Signed)
Patient is at the pharmacy he need a ICD10 Code for [place] TEST test strip, [place], [place] [phone] (Phone) [phone] (Fax)       he is waiting at the  Pharmacy now, and he is totally out of strips

## 2016-02-13 ENCOUNTER — Other Ambulatory Visit: Payer: Self-pay

## 2016-02-13 MED ORDER — GLUCOSE BLOOD VI STRP: ORAL_STRIP | Status: DC

## 2016-02-16 ENCOUNTER — Telehealth: Payer: Self-pay | Admitting: Internal Medicine

## 2016-02-16 ENCOUNTER — Ambulatory Visit (INDEPENDENT_AMBULATORY_CARE_PROVIDER_SITE_OTHER): Payer: Medicare Other | Admitting: Internal Medicine

## 2016-02-16 ENCOUNTER — Encounter: Payer: Self-pay | Admitting: Internal Medicine

## 2016-02-16 ENCOUNTER — Other Ambulatory Visit: Payer: Self-pay

## 2016-02-16 ENCOUNTER — Telehealth: Payer: Self-pay

## 2016-02-16 VITALS — BP 118/78 | HR 61 | Ht 69.0 in | Wt 209.0 lb

## 2016-02-16 DIAGNOSIS — E1121 Type 2 diabetes mellitus with diabetic nephropathy: Secondary | ICD-10-CM

## 2016-02-16 DIAGNOSIS — Z794 Long term (current) use of insulin: Secondary | ICD-10-CM

## 2016-02-16 LAB — POCT GLYCOSYLATED HEMOGLOBIN (HGB A1C): Hemoglobin A1C: 6.8

## 2016-02-16 MED ORDER — INSULIN REGULAR HUMAN 100 UNIT/ML IJ SOLN: INTRAMUSCULAR | Status: DC

## 2016-02-16 NOTE — Telephone Encounter (Signed)
Patient asking about medication below.  Currently taking Novolog.

## 2016-02-16 NOTE — Addendum Note (Signed)
Addended by: Caprice Beaver T on: 02/16/2016 09:22 AM   Modules accepted: Orders

## 2016-02-16 NOTE — Telephone Encounter (Signed)
Just for clarification are we sending the Novolin N Relion or Novolin R Relion?

## 2016-02-16 NOTE — Telephone Encounter (Signed)
Novolin R!

## 2016-02-16 NOTE — Patient Instructions (Addendum)
Please continue: - Metformin 500 mg 2x a day with meals   - Tradjenta 5 mg in am - Novolog SSI - target 140, adding 2 units for approx 30 mg/dL  Increase: - Levemir to 14 units at bedtime  Please return in 4 months with your sugar log.

## 2016-02-16 NOTE — Telephone Encounter (Signed)
Called and notified patient of new rx sent to pharmacy, advised to watch for low CBG's.

## 2016-02-16 NOTE — Progress Notes (Addendum)
Patient ID: [name] [name], male   DOB: Aug 06, 1950, 65 y.o.   MRN: [id number]  HPI: [name] [name] is a 65 y.o.-year-old male, returning for f/u for DM2, dx in 06/2012, insulin-dependent since 09/2012, uncontrolled, with complications (CKD stage 3, mild DR). Last visit 6 mo ago.  He gained 5 lbs since last visit.  Last hemoglobin A1c was: Lab Results  Component Value Date   HGBA1C 6.6 08/24/2015   HGBA1C 6.3 04/25/2015   HGBA1C 6.4 12/29/2014   Pt is on a regimen of: - Metformin 500 mg 2x a day with meals - Levemir 12-15 units at bedtime - Tradjenta 5 mg daily - restarted 01/2014. He has Novolog SSI - target 140, adds 2 units for approx 30 mg/dL - uses it more lately  He had to stop Tradjenta 5 mg daily in the past as his [employer] did not cover it. Pays 47$ for 1 mo (tier 3). He heard about Victoza, but it is not very eager to try newer medicines.  He checks sugars 1-3x a day and they are higher: - am:101-120 >> 101-123 >> 90-130 >> 97-110 >> 101-110 >> 92-113 >> 78-148 >> 111-150, 180 (2-5 units Nov) - 1h after b'fast: 105-168 >> n/c >> <150 >> max 120 >> n/c - before lunch: 97-147 >> 90-150 >> n/c >> 116-117 >> 95-115 >> 80-130 (juices for lunch) >> 110-140 - 2h after lunch: up to 130-140 >> 98-110 >> n/c - before dinner: 122-198 >> 101-173 >> 117-161 >> 112-132 >> 117-130 >> 88-115 >> 120s >> 95-140 - 2h after dinner: 122-131 >> n/c >> 160-170 >> 120-140 >> <140 >> n/c - bedtime: 119-164 >> not checking >> 120-130 >> 130-140 No lows. Lowest sugar was 82 >> 78 >> 88 >> 78 >> 95; does not know if has hypoglycemia awareness. Highest sugar was 160 >> 200 x2 - overate >> 208 >> 148 >> 180.  Pt has chronic kidney disease, last BUN/creatinine was:  Lab Results  Component Value Date   BUN 15 09/04/2015   CREATININE 1.30 09/04/2015  Last ACR 0.6 12/2012. Last set of lipids: Lab Results  Component Value Date   CHOL 181 09/04/2015   HDL 38.60* 09/04/2015   LDLCALC 124* 12/29/2014   LDLDIRECT 106.0 09/04/2015   TRIG 288.0* 09/04/2015   CHOLHDL 5 09/04/2015  He restarted Crestor 5 mg.  Pt's last eye exam was 010/2016. dr Zigmund Daniel . 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 Denies numbness and tingling in his legs. Foot exam: 07/2015.  He also has a history of nonischemic cardiomyopathy, with 2-D echo showing an EF of [id number], grade 1 diastolic dysfunction, severe LV H, normal LV size and mildly dilated LA. He also has hyperlipidemia, history of atrial fibrillation, COPD and GERD. No h/o pancreatitis.   I reviewed pt's medications, allergies, PMH, social hx, family hx, and changes were documented in the history of present illness. Otherwise, unchanged from my initial visit note. Started Norvasc.  ROS: Constitutional: + weight gain, no fatigue, no subjective hyperthermia/hypothermia Eyes: no blurry vision, no xerophthalmia ENT: no sore throat, no nodules palpated in throat, no dysphagia/odynophagia, no hoarseness Cardiovascular: no CP/SOB/palpitations/leg swelling Respiratory: no cough/SOB, no wheezing Gastrointestinal: no N/V/D/C/heartburn Musculoskeletal: no muscle/ joint aches Skin: no rashes Neurological: no tremors/numbness/tingling/dizziness  PE: BP 118/78 mmHg  Pulse 61  Ht 5\' 9"  (1.753 m)  Wt 209 lb (94.802 kg)  BMI 30.85 kg/m2  SpO2 97% Body mass index is 30.85 kg/(m^2). Wt Readings from Last 3 Encounters:  02/16/16 209 lb (94.802 kg)  09/04/15 206 lb 4 oz (93.554 kg)  08/24/15 204 lb (92.534 kg)   Constitutional: overweight, in NAD Eyes: PERRLA, EOMI, no exophthalmos ENT: moist mucous membranes, no thyromegaly, no cervical lymphadenopathy Cardiovascular: RRR, No MRG Respiratory: CTA B Gastrointestinal: abdomen soft, NT, ND, BS+ Musculoskeletal: no deformities, strength intact in all 4 Skin: moist, warm, no rashes Neurological: no tremor with outstretched hands, DTR normal in all 4  ASSESSMENT: 1. DM2, insulin-dependent,  uncontrolled, with complications - CKD stage III - mild DR  He sees Dr Stanford Breed for Afib and cardiomyopathy (LV Htf)  PLAN:  1. Patient with improved diabetes control after addition of Tradjenta. Last HbA1c 6.6% (great!). Sugars are higher and I suspect that this is 2/2 increased insulin resistance 2/2 weight gain. We discussed the need to change his diet to lose weight. Will also increase Lantus as sugars in am are the highest. - I advised him to: Patient Instructions  Please continue: - Metformin 500 mg 2x a day with meals   - Tradjenta 5 mg in am - Novolog SSI - target 140, adding 2 units for approx 30 mg/dL  Increase: - Levemir to 14 units at bedtime  Please return in 4 months with your sugar log.   - will check a hemoglobin A1c today >> 6.8% (still good, but higher!) - advised to schedule a new eye exam, he is due - continue to check sugars 1-2 times a day - I will see him back in 4 months with his sugar log   [name] Kingdom, MD PhD [facility]

## 2016-02-16 NOTE — Telephone Encounter (Signed)
Patient would like to know what you think about insulin, Relion Novolin summit a prescription to   [facility], [place] [phone] (Phone) [phone] (Fax)       If you think it's best

## 2016-02-16 NOTE — Telephone Encounter (Signed)
Ok, please send a vial with 2 refills. Please advise pt that this is longer acting and to watch for low CBGs.

## 2016-02-28 ENCOUNTER — Other Ambulatory Visit: Payer: Self-pay | Admitting: Internal Medicine

## 2016-03-11 ENCOUNTER — Other Ambulatory Visit (INDEPENDENT_AMBULATORY_CARE_PROVIDER_SITE_OTHER): Payer: Medicare Other

## 2016-03-11 ENCOUNTER — Ambulatory Visit (INDEPENDENT_AMBULATORY_CARE_PROVIDER_SITE_OTHER): Payer: Medicare Other | Admitting: Internal Medicine

## 2016-03-11 ENCOUNTER — Encounter: Payer: Self-pay | Admitting: Internal Medicine

## 2016-03-11 VITALS — BP 148/74 | HR 57 | Temp 98.2°F | Resp 16 | Ht 69.0 in | Wt 212.0 lb

## 2016-03-11 DIAGNOSIS — E876 Hypokalemia: Secondary | ICD-10-CM | POA: Diagnosis not present

## 2016-03-11 DIAGNOSIS — N400 Enlarged prostate without lower urinary tract symptoms: Secondary | ICD-10-CM

## 2016-03-11 DIAGNOSIS — Z794 Long term (current) use of insulin: Secondary | ICD-10-CM

## 2016-03-11 DIAGNOSIS — E1121 Type 2 diabetes mellitus with diabetic nephropathy: Secondary | ICD-10-CM | POA: Diagnosis not present

## 2016-03-11 DIAGNOSIS — E669 Obesity, unspecified: Secondary | ICD-10-CM

## 2016-03-11 DIAGNOSIS — E66811 Obesity, class 1: Secondary | ICD-10-CM

## 2016-03-11 DIAGNOSIS — I480 Paroxysmal atrial fibrillation: Secondary | ICD-10-CM

## 2016-03-11 DIAGNOSIS — I10 Essential (primary) hypertension: Secondary | ICD-10-CM | POA: Diagnosis not present

## 2016-03-11 DIAGNOSIS — J301 Allergic rhinitis due to pollen: Secondary | ICD-10-CM

## 2016-03-11 DIAGNOSIS — E663 Overweight: Secondary | ICD-10-CM | POA: Insufficient documentation

## 2016-03-11 DIAGNOSIS — J410 Simple chronic bronchitis: Secondary | ICD-10-CM

## 2016-03-11 LAB — URINALYSIS, ROUTINE W REFLEX MICROSCOPIC
BILIRUBIN URINE: NEGATIVE
Hgb urine dipstick: NEGATIVE
Ketones, ur: NEGATIVE
Leukocytes, UA: NEGATIVE
Nitrite: NEGATIVE
PH: 6 (ref 5.0–8.0)
RBC / HPF: NONE SEEN (ref 0–?)
SPECIFIC GRAVITY, URINE: 1.02 (ref 1.000–1.030)
TOTAL PROTEIN, URINE-UPE24: NEGATIVE
Urine Glucose: NEGATIVE
Urobilinogen, UA: 0.2 (ref 0.0–1.0)

## 2016-03-11 LAB — MICROALBUMIN / CREATININE URINE RATIO
Creatinine,U: 110.5 mg/dL
MICROALB UR: 0.9 mg/dL (ref 0.0–1.9)
Microalb Creat Ratio: 0.8 mg/g (ref 0.0–30.0)

## 2016-03-11 LAB — BASIC METABOLIC PANEL
BUN: 16 mg/dL (ref 6–23)
CALCIUM: 9.7 mg/dL (ref 8.4–10.5)
CO2: 32 meq/L (ref 19–32)
CREATININE: 1.47 mg/dL (ref 0.40–1.50)
Chloride: 101 mEq/L (ref 96–112)
GFR: 61.43 mL/min (ref >60.00)
GLUCOSE: 141 mg/dL — AB (ref 70–99)
Potassium: 4.2 mEq/L (ref 3.5–5.1)
SODIUM: 141 meq/L (ref 135–145)

## 2016-03-11 LAB — PSA: PSA: 1.79 ng/mL (ref 0.10–4.00)

## 2016-03-11 MED ORDER — MONTELUKAST SODIUM 10 MG PO TABS: 10.0000 mg | ORAL_TABLET | Freq: Every day | ORAL | 3 refills | Status: DC

## 2016-03-11 MED ORDER — GLYCOPYRROLATE-FORMOTEROL 9-4.8 MCG/ACT IN AERO: 2.0000 | INHALATION_SPRAY | Freq: Two times a day (BID) | RESPIRATORY_TRACT | 11 refills | Status: DC

## 2016-03-11 NOTE — Progress Notes (Signed)
Subjective:  Patient ID: [name] [name], male    DOB: 1951/06/04  Age: 66 y.o. MRN: [id number]  CC: COPD; Diabetes; and Hypertension   HPI Tajaun Butchart presents for follow-up on the above medical problems. He complains of persistent nasal congestion and occasional postnasal drip. He denies sneezing, facial pain, sore throat, fever, chills, or bloody nasal phlegm.  He has ongoing symptoms related to his lung disease and tobacco abuse. He has an occasional cough which is productive of clear phlegm and intermittent wheezing and shortness of breath. He's had no recent episodes of hemoptysis, chest pain, night sweats, weight loss, or loss of appetite.  .  Outpatient Medications Prior to Visit  Medication Sig Dispense Refill  . albuterol (PROVENTIL HFA;VENTOLIN HFA) 108 (90 BASE) MCG/ACT inhaler Inhale 2 puffs into the lungs every 6 (six) hours as needed for wheezing. 18 g 5  . amLODipine (NORVASC) 5 MG tablet Take 1 tablet (5 mg total) by mouth daily. 90 tablet 3  . aspirin 81 MG chewable tablet Chew 81 mg by mouth daily.    . Cetirizine HCl 10 MG TBDP Take 1 tablet by mouth daily. 90 tablet 3  . fish oil-omega-3 fatty acids 1000 MG capsule Take 2 g by mouth daily.     Marland Kitchen glucose blood (ONE TOUCH ULTRA TEST) test strip USE TO CHECK BLOOD SUGAR THREE TIMES DAILY AS INSTRUCTED 300 each 1  . hydrochlorothiazide (HYDRODIURIL) 25 MG tablet TAKE ONE TABLET BY MOUTH ONCE DAILY 90 tablet 2  . insulin detemir (LEVEMIR) 100 UNIT/ML injection Inject 0.15 mLs (15 Units total) into the skin daily. 10 mL 1  . insulin regular (NOVOLIN R RELION) 100 units/mL injection Inject 3 to 6 units subcutaneously three times daily. 10 mL 2  . linagliptin (TRADJENTA) 5 MG TABS tablet TAKE ONE TABLET BY MOUTH ONCE DAILY 90 tablet 0  . losartan (COZAAR) 100 MG tablet TAKE ONE TABLET BY MOUTH ONCE DAILY 90 tablet 3  . metFORMIN (GLUCOPHAGE) 500 MG tablet TAKE ONE TABLET BY MOUTH TWICE DAILY WITH A MEAL 60 tablet 3  .  metoprolol succinate (TOPROL-XL) 50 MG 24 hr tablet TAKE ONE TABLET BY MOUTH ONCE DAILY WITH OR IMMEDIATELY FOLLOWING A MEAL 90 tablet 1  . omeprazole (PRILOSEC) 20 MG capsule TAKE ONE CAPSULE BY MOUTH ONCE DAILY 90 capsule 1  . RELION INSULIN SYR 0.3ML/31G 31G X 5/16" 0.3 ML MISC USE AS DIRECTED 4 TIMES DAILY 200 each 4  . rosuvastatin (CRESTOR) 5 MG tablet Take 5 mg by mouth 3 (three) times a week.     No facility-administered medications prior to visit.     ROS Review of Systems  Constitutional: Negative.  Negative for activity change, chills, fatigue and fever.  HENT: Positive for congestion and postnasal drip. Negative for facial swelling, rhinorrhea, sinus pressure, sneezing, sore throat, tinnitus, trouble swallowing and voice change.   Eyes: Negative.  Negative for visual disturbance.  Respiratory: Positive for cough, shortness of breath and wheezing. Negative for apnea, choking, chest tightness and stridor.   Cardiovascular: Negative.  Negative for chest pain, palpitations and leg swelling.  Gastrointestinal: Negative.  Negative for abdominal pain, blood in stool, constipation, diarrhea, nausea and vomiting.  Endocrine: Negative.  Negative for polydipsia, polyphagia and polyuria.  Genitourinary: Negative.  Negative for difficulty urinating, dysuria, hematuria and urgency.  Musculoskeletal: Negative.  Negative for arthralgias, back pain, myalgias and neck pain.  Skin: Negative.  Negative for color change and rash.  Allergic/Immunologic: Negative.   Neurological: Negative.  Negative for dizziness, weakness, light-headedness, numbness and headaches.  Hematological: Negative.  Negative for adenopathy. Does not bruise/bleed easily.    Objective:  BP (!) 148/74 (BP Location: Left Arm, Patient Position: Sitting, Cuff Size: Normal)   Pulse (!) 57   Temp 98.2 F (36.8 C) (Oral)   Resp 16   Ht 5\' 9"  (1.753 m)   Wt 212 lb (96.2 kg)   SpO2 96%   BMI 31.31 kg/m   BP Readings from Last  3 Encounters:  03/11/16 (!) 148/74  02/16/16 118/78  09/04/15 (!) 150/88    Wt Readings from Last 3 Encounters:  03/11/16 212 lb (96.2 kg)  02/16/16 209 lb (94.8 kg)  09/04/15 206 lb 4 oz (93.6 kg)    Physical Exam  Constitutional: No distress.  HENT:  Mouth/Throat: Oropharynx is clear and moist. No oropharyngeal exudate.  Eyes: Conjunctivae are normal. Right eye exhibits no discharge. Left eye exhibits no discharge. No scleral icterus.  Neck: Normal range of motion. Neck supple. No JVD present. No tracheal deviation present. No thyromegaly present.  Cardiovascular: Normal rate, regular rhythm, normal heart sounds and intact distal pulses.  Exam reveals no gallop and no friction rub.   No murmur heard. Pulmonary/Chest: Effort normal. No stridor. No respiratory distress. He has no decreased breath sounds. He has no wheezes. He has rhonchi in the right middle field, the right lower field, the left middle field and the left lower field. He has no rales. He exhibits no tenderness.  Abdominal: Soft. Bowel sounds are normal. He exhibits no distension and no mass. There is no tenderness. There is no rebound and no guarding.  Lymphadenopathy:    He has no cervical adenopathy.  Skin: He is not diaphoretic.  Vitals reviewed.   Lab Results  Component Value Date   WBC 10.1 09/04/2015   HGB 13.6 09/04/2015   HCT 41.0 09/04/2015   PLT 315.0 09/04/2015   GLUCOSE 141 (H) 03/11/2016   CHOL 181 09/04/2015   TRIG 288.0 (H) 09/04/2015   HDL 38.60 (L) 09/04/2015   LDLDIRECT 106.0 09/04/2015   LDLCALC 124 (H) 12/29/2014   ALT 10 09/04/2015   AST 15 09/04/2015   NA 141 03/11/2016   K 4.2 03/11/2016   CL 101 03/11/2016   CREATININE 1.47 03/11/2016   BUN 16 03/11/2016   CO2 32 03/11/2016   TSH 2.72 09/04/2015   PSA 1.79 03/11/2016   INR 0.91 10/19/2012   HGBA1C 6.8 02/16/2016   MICROALBUR 0.9 03/11/2016    No results found.  Assessment & Plan:   Daemien was seen today for copd,  diabetes and hypertension.  Diagnoses and all orders for this visit:  Essential hypertension, benign- his blood pressures well controlled, electrolytes and renal function are stable. -     Basic metabolic panel; Future -     Urinalysis, Routine w reflex microscopic (not at [facility]); Future  Paroxysmal atrial fibrillation ([place])- he has good rate and rhythm control, he is not interested in being anticoagulated.  Type 2 diabetes mellitus with diabetic nephropathy, with long-term current use of insulin- his blood sugars are well-controlled (HCC) -     Microalbumin / creatinine urine ratio; Future -     Basic metabolic panel; Future -     Ambulatory referral to Ophthalmology  Simple chronic bronchitis ([place])- he has persistent symptoms despite using a short acting beta agonist, I have obviously asked him to quit smoking but to improve his symptoms will add a LABA/LAMA combo -  Glycopyrrolate-Formoterol (BEVESPI AEROSPHERE) 9-4.8 MCG/ACT AERO; Inhale 2 Act into the lungs 2 (two) times daily.  BPH (benign prostatic hyperplasia)- he has minimal symptoms and his PSA is not [place] not concerned about prostate cancer. -     PSA; Future -     Urinalysis, Routine w reflex microscopic (not at [facility]); Future  Hypokalemia- improvement noted -     Basic metabolic panel; Future  Non-seasonal allergic rhinitis due to pollen- I will add Singulair to Zyrtec for additional symptom relief -     montelukast (SINGULAIR) 10 MG tablet; Take 1 tablet (10 mg total) by mouth at bedtime.  Obesity (BMI 30.0-34.9)- he agrees to work on his lifestyle modifications to help him lose weight.   I am having Mr. Frieders start on montelukast and Glycopyrrolate-Formoterol. I am also having him maintain his fish oil-omega-3 fatty acids, aspirin, amLODipine, albuterol, metoprolol succinate, insulin detemir, RELION INSULIN SYR 0.3ML/31G, rosuvastatin, Cetirizine HCl, linagliptin, hydrochlorothiazide, omeprazole, metFORMIN,  glucose blood, insulin regular, and losartan.  Meds ordered this encounter  Medications  . montelukast (SINGULAIR) 10 MG tablet    Sig: Take 1 tablet (10 mg total) by mouth at bedtime.    Dispense:  90 tablet    Refill:  3  . Glycopyrrolate-Formoterol (BEVESPI AEROSPHERE) 9-4.8 MCG/ACT AERO    Sig: Inhale 2 Act into the lungs 2 (two) times daily.    Dispense:  10.7 g    Refill:  11     Follow-up: Return in about 4 months (around July 18, 202017).  Scarlette Calico, MD

## 2016-03-11 NOTE — Patient Instructions (Signed)
Chronic Obstructive Pulmonary Disease Chronic obstructive pulmonary disease (COPD) is a common lung condition in which airflow from the lungs is limited. COPD is a general term that can be used to describe many different lung problems that limit airflow, including both chronic bronchitis and emphysema. If you have COPD, your lung function will probably never return to normal, but there are measures you can take to improve lung function and make yourself feel better. CAUSES   Smoking (common).  Exposure to secondhand smoke.  Genetic problems.  Chronic inflammatory lung diseases or recurrent infections. SYMPTOMS  Shortness of breath, especially with physical activity.  Deep, persistent (chronic) cough with a large amount of thick mucus.  Wheezing.  Rapid breaths (tachypnea).  Gray or bluish discoloration (cyanosis) of the skin, especially in your fingers, toes, or lips.  Fatigue.  Weight loss.  Frequent infections or episodes when breathing symptoms become much worse (exacerbations).  Chest tightness. DIAGNOSIS Your health care provider will take a medical history and perform a physical examination to diagnose COPD. Additional tests for COPD may include:  Lung (pulmonary) function tests.  Chest X-ray.  CT scan.  Blood tests. TREATMENT  Treatment for COPD may include:  Inhaler and nebulizer medicines. These help manage the symptoms of COPD and make your breathing more comfortable.  Supplemental oxygen. Supplemental oxygen is only helpful if you have a low oxygen level in your blood.  Exercise and physical activity. These are beneficial for nearly all people with COPD.  Lung surgery or transplant.  Nutrition therapy to gain weight, if you are underweight.  Pulmonary rehabilitation. This may involve working with a team of health care providers and specialists, such as respiratory, occupational, and physical therapists. HOME CARE INSTRUCTIONS  Take all medicines  (inhaled or pills) as directed by your health care provider.  Avoid over-the-counter medicines or cough syrups that dry up your airway (such as antihistamines) and slow down the elimination of secretions unless instructed otherwise by your health care provider.  If you are a smoker, the most important thing that you can do is stop smoking. Continuing to smoke will cause further lung damage and breathing trouble. Ask your health care provider for help with quitting smoking. He or she can direct you to community resources or hospitals that provide support.  Avoid exposure to irritants such as smoke, chemicals, and fumes that aggravate your breathing.  Use oxygen therapy and pulmonary rehabilitation if directed by your health care provider. If you require home oxygen therapy, ask your health care provider whether you should purchase a pulse oximeter to measure your oxygen level at home.  Avoid contact with individuals who have a contagious illness.  Avoid extreme temperature and humidity changes.  Eat healthy foods. Eating smaller, more frequent meals and resting before meals may help you maintain your strength.  Stay active, but balance activity with periods of rest. Exercise and physical activity will help you maintain your ability to do things you want to do.  Preventing infection and hospitalization is very important when you have COPD. Make sure to receive all the vaccines your health care provider recommends, especially the pneumococcal and influenza vaccines. Ask your health care provider whether you need a pneumonia vaccine.  Learn and use relaxation techniques to manage stress.  Learn and use controlled breathing techniques as directed by your health care provider. Controlled breathing techniques include:  Pursed lip breathing. Start by breathing in (inhaling) through your nose for 1 second. Then, purse your lips as if you were   going to whistle and breathe out (exhale) through the  pursed lips for 2 seconds.  Diaphragmatic breathing. Start by putting one hand on your abdomen just above your waist. Inhale slowly through your nose. The hand on your abdomen should move out. Then purse your lips and exhale slowly. You should be able to feel the hand on your abdomen moving in as you exhale.  Learn and use controlled coughing to clear mucus from your lungs. Controlled coughing is a series of short, progressive coughs. The steps of controlled coughing are: 1. Lean your head slightly forward. 2. Breathe in deeply using diaphragmatic breathing. 3. Try to hold your breath for 3 seconds. 4. Keep your mouth slightly open while coughing twice. 5. Spit any mucus out into a tissue. 6. Rest and repeat the steps once or twice as needed. SEEK MEDICAL CARE IF:  You are coughing up more mucus than usual.  There is a change in the color or thickness of your mucus.  Your breathing is more labored than usual.  Your breathing is faster than usual. SEEK IMMEDIATE MEDICAL CARE IF:  You have shortness of breath while you are resting.  You have shortness of breath that prevents you from:  Being able to talk.  Performing your usual physical activities.  You have chest pain lasting longer than 5 minutes.  Your skin color is more cyanotic than usual.  You measure low oxygen saturations for longer than 5 minutes with a pulse oximeter. MAKE SURE YOU:  Understand these instructions.  Will watch your condition.  Will get help right away if you are not doing well or get worse.   This information is not intended to replace advice given to you by your health care provider. Make sure you discuss any questions you have with your health care provider.   Document Released: 04/24/2005 Document Revised: 08/05/2014 Document Reviewed: 03/11/2013 Elsevier Interactive Patient Education 2016 [employer] Inc[employer]  

## 2016-03-11 NOTE — Progress Notes (Signed)
Pre visit review using our clinic review tool, if applicable. No additional management support is needed unless otherwise documented below in the visit note. 

## 2016-04-04 ENCOUNTER — Other Ambulatory Visit: Payer: Self-pay | Admitting: Internal Medicine

## 2016-04-24 ENCOUNTER — Telehealth: Payer: Self-pay

## 2016-04-24 ENCOUNTER — Telehealth: Payer: Self-pay | Admitting: Internal Medicine

## 2016-04-24 NOTE — Telephone Encounter (Signed)
Please call [facility] [phone] for a smaller sized syringes .5 cc 3 mm syringes

## 2016-04-24 NOTE — Telephone Encounter (Signed)
Called [facility], advised of which syringes they were needed. They were not sure, they will call patient and see what he wants for a needle size and let us know and we can send in for him.

## 2016-04-25 ENCOUNTER — Other Ambulatory Visit: Payer: Self-pay | Admitting: Internal Medicine

## 2016-04-26 NOTE — Telephone Encounter (Signed)
Pt called back to let us know that he needs the 1/2 cc Low Dose Syringes called into the [facility].

## 2016-04-30 ENCOUNTER — Ambulatory Visit (INDEPENDENT_AMBULATORY_CARE_PROVIDER_SITE_OTHER): Payer: Medicare Other

## 2016-04-30 DIAGNOSIS — Z23 Encounter for immunization: Secondary | ICD-10-CM

## 2016-05-01 ENCOUNTER — Other Ambulatory Visit: Payer: Self-pay

## 2016-05-01 MED ORDER — "INSULIN SYRINGE-NEEDLE U-100 31G X 5/16"" 0.3 ML MISC": 4 refills | Status: AC

## 2016-06-19 ENCOUNTER — Ambulatory Visit: Payer: Medicare Other | Admitting: Internal Medicine

## 2016-07-01 ENCOUNTER — Encounter (INDEPENDENT_AMBULATORY_CARE_PROVIDER_SITE_OTHER): Payer: Medicare Other | Admitting: Ophthalmology

## 2016-07-18 ENCOUNTER — Ambulatory Visit: Payer: Medicare Other | Admitting: Internal Medicine

## 2016-07-26 ENCOUNTER — Telehealth: Payer: Self-pay | Admitting: Internal Medicine

## 2016-07-30 ENCOUNTER — Other Ambulatory Visit: Payer: Self-pay

## 2016-07-30 MED ORDER — INSULIN DETEMIR 100 UNIT/ML ~~LOC~~ SOLN: 15.0000 [IU] | Freq: Every day | SUBCUTANEOUS | 0 refills | Status: DC

## 2016-07-30 MED ORDER — METFORMIN HCL 500 MG PO TABS: 500.0000 mg | ORAL_TABLET | Freq: Two times a day (BID) | ORAL | 1 refills | Status: DC

## 2016-07-30 NOTE — Telephone Encounter (Signed)
Metformin and levemir pens called to [facility] please

## 2016-07-31 ENCOUNTER — Other Ambulatory Visit: Payer: Self-pay

## 2016-07-31 MED ORDER — INSULIN DETEMIR 100 UNIT/ML FLEXPEN: PEN_INJECTOR | SUBCUTANEOUS | 1 refills | Status: DC

## 2016-08-05 ENCOUNTER — Encounter: Payer: Self-pay | Admitting: Internal Medicine

## 2016-08-12 ENCOUNTER — Other Ambulatory Visit: Payer: Self-pay | Admitting: Internal Medicine

## 2016-08-14 ENCOUNTER — Encounter (INDEPENDENT_AMBULATORY_CARE_PROVIDER_SITE_OTHER): Payer: Medicare Other | Admitting: Ophthalmology

## 2016-09-04 ENCOUNTER — Ambulatory Visit: Payer: Medicare Other | Admitting: Internal Medicine

## 2016-09-06 ENCOUNTER — Other Ambulatory Visit: Payer: Self-pay | Admitting: Internal Medicine

## 2016-09-09 ENCOUNTER — Telehealth: Payer: Self-pay | Admitting: Internal Medicine

## 2016-09-09 NOTE — Telephone Encounter (Signed)
[facility] called and said that they need a new prescription with a Dx code on the script for the test strips.

## 2016-09-10 ENCOUNTER — Other Ambulatory Visit: Payer: Self-pay

## 2016-09-10 MED ORDER — GLUCOSE BLOOD VI STRP: ORAL_STRIP | 5 refills | Status: DC

## 2016-09-10 NOTE — Telephone Encounter (Signed)
Submitted

## 2016-09-11 ENCOUNTER — Encounter: Payer: Self-pay | Admitting: Internal Medicine

## 2016-09-11 ENCOUNTER — Ambulatory Visit (INDEPENDENT_AMBULATORY_CARE_PROVIDER_SITE_OTHER): Payer: Medicare Other | Admitting: Internal Medicine

## 2016-09-11 ENCOUNTER — Other Ambulatory Visit: Payer: Self-pay

## 2016-09-11 VITALS — BP 134/90 | HR 62 | Ht 68.0 in | Wt 208.0 lb

## 2016-09-11 DIAGNOSIS — E1121 Type 2 diabetes mellitus with diabetic nephropathy: Secondary | ICD-10-CM

## 2016-09-11 DIAGNOSIS — Z794 Long term (current) use of insulin: Secondary | ICD-10-CM | POA: Diagnosis not present

## 2016-09-11 LAB — POCT GLYCOSYLATED HEMOGLOBIN (HGB A1C): HEMOGLOBIN A1C: 6.6

## 2016-09-11 MED ORDER — METFORMIN HCL 500 MG PO TABS: 500.0000 mg | ORAL_TABLET | Freq: Two times a day (BID) | ORAL | 11 refills | Status: DC

## 2016-09-11 MED ORDER — LINAGLIPTIN 5 MG PO TABS: 5.0000 mg | ORAL_TABLET | Freq: Every day | ORAL | 11 refills | Status: DC

## 2016-09-11 MED ORDER — GLUCOSE BLOOD VI STRP: ORAL_STRIP | 11 refills | Status: DC

## 2016-09-11 NOTE — Patient Instructions (Addendum)
Please continue: - Metformin 500 mg 2x a day with meals   - Tradjenta 5 mg in am - Novolin SSI - target 140, adding 2 units for approx 30 mg/dL - Levemir 14 units at bedtime  Please return in 4 months with your sugar log.

## 2016-09-11 NOTE — Addendum Note (Signed)
Addended by: Caprice Beaver T on: 09/11/2016 08:36 AM   Modules accepted: Orders

## 2016-09-11 NOTE — Progress Notes (Signed)
Patient ID: [name] [name], male   DOB: 07/12/1951, 66 y.o.   MRN: [id number]  HPI: [name] [name] is a 66 y.o.-year-old male, returning for f/u for DM2, dx in 06/2012, insulin-dependent since 09/2012, uncontrolled, with complications (CKD stage 3, mild DR). Last visit 7 mo ago (he is not compliant with appts).  Last hemoglobin A1c was: Lab Results  Component Value Date   HGBA1C 6.8 02/16/2016   HGBA1C 6.6 08/24/2015   HGBA1C 6.3 04/25/2015   Pt is on a regimen of: - Metformin 500 mg 2x a day with meals   - Tradjenta 5 mg in am - Novolin SSI - target 140, adding 2 units for approx 30 mg/dL - takes this at most 3x a week - Levemir 12-15 units at bedtime He had to stop Tradjenta 5 mg in the past as his [employer] did not cover it. Pays 47$ for 1 mo (tier 3). He heard about Victoza, but it is not very eager to try newer medicines.  He checks sugars 1-3x a day and they are - no log, no meter: - am:97-110 >> 101-110 >> 92-113 >> 78-148 >> 111-150, 180 (2-5 units Nov) >> 109-110s - 1h after b'fast: 105-168 >> n/c >> <150 >> max 120 >> n/c - before lunch:116-117 >> 95-115 >> 80-130 (juices for lunch) >> 110-140 >> 120-140 - 2h after lunch: up to 130-140 >> 98-110 >> n/c - before dinner: 112-132 >> 117-130 >> 88-115 >> 120s >> 95-140 >> 120-140 - 2h after dinner: 122-131 >> n/c >> 160-170 >> 120-140 >> <140 >> n/c - bedtime: 119-164 >> not checking >> 120-130 >> 130-140 >> 150s No lows. Lowest sugar was 95 >> 71; does not know if has hypoglycemia awareness.  Highest sugar was 180 >> 190.  Pt has chronic kidney disease, last BUN/creatinine was:  Lab Results  Component Value Date   BUN 16 03/11/2016   CREATININE 1.47 03/11/2016  Last ACR 0.6 12/2012. Last set of lipids: Lab Results  Component Value Date   CHOL 181 09/04/2015   HDL 38.60 (L) 09/04/2015   LDLCALC 124 (H) 12/29/2014   LDLDIRECT 106.0 09/04/2015   TRIG 288.0 (H) 09/04/2015   CHOLHDL 5 09/04/2015  He is on Crestor 5 mg  qod.  Pt's last eye exam was 04/2015. Dr [name] [name] . 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 He will have this in March. Denies numbness and tingling in his legs. Foot exam: 07/2015.  He also has a history of nonischemic cardiomyopathy, with 2-D echo showing an EF of [id number], grade 1 diastolic dysfunction, severe LV H, normal LV size and mildly dilated LA. He also has hyperlipidemia, history of atrial fibrillation, COPD and GERD. No h/o pancreatitis.   I reviewed pt's medications, allergies, PMH, social hx, family hx, and changes were documented in the history of present illness. Otherwise, unchanged from my initial visit note. Started Norvasc.  ROS: Constitutional: + weight loss, no fatigue, no subjective hyperthermia/hypothermia, + excessive urination Eyes: no blurry vision, no xerophthalmia ENT: no sore throat, no nodules palpated in throat, no dysphagia/odynophagia, no hoarseness Cardiovascular: no CP/SOB/palpitations/leg swelling Respiratory: no cough/SOB, no wheezing Gastrointestinal: no N/V/D/C/heartburn Musculoskeletal: no muscle/ joint aches Skin: no rashes Neurological: no tremors/numbness/tingling/dizziness  PE: BP 134/90 (BP Location: Left Arm, Patient Position: Sitting)   Pulse 62   Ht 5\' 8"  (1.727 m)   Wt 208 lb (94.3 kg)   SpO2 97%   BMI 31.63 kg/m  Body mass index is 31.63 kg/m. Wt Readings  from Last 3 Encounters:  09/11/16 208 lb (94.3 kg)  03/11/16 212 lb (96.2 kg)  02/16/16 209 lb (94.8 kg)   Constitutional: overweight, in NAD Eyes: PERRLA, EOMI, no exophthalmos ENT: moist mucous membranes, no thyromegaly, no cervical lymphadenopathy Cardiovascular: RRR, No MRG Respiratory: CTA B Gastrointestinal: abdomen soft, NT, ND, BS+ Musculoskeletal: no deformities, strength intact in all 4 Skin: moist, warm, no rashes Neurological: no tremor with outstretched hands, DTR normal in all 4  ASSESSMENT: 1. DM2, insulin-dependent, uncontrolled, with  complications - CKD stage III - mild DR  He sees Dr [name] [name] for Afib and cardiomyopathy (LV Htf)  PLAN:  1. Patient with improved diabetes control after addition of Tradjenta. However, last HbA1c 6.8% (increasing).  - Sugars were higher at last visit and we increased his basal insulin. We also discussed the need to change his diet to lose weight. He did not come back in 4 mo as advised. His sugars are now slightly better and he lost 4 lbs.  - I advised him to: Patient Instructions  Please continue: - Metformin 500 mg 2x a day with meals   - Tradjenta 5 mg in am - Novolin SSI - target 140, adding 2 units for approx 30 mg/dL - Levemir 14 units at bedtime  Please return in 4 months with your sugar log.   - will check a hemoglobin A1c today >> 6.6% (better!)) - advised to schedule a new eye exam, he is due >> scheduled - continue to check sugars 1-2 times a day - I will see him back in 4 months with his sugar log  [name] Kingdom, MD PhD [facility] Endocrinology

## 2016-09-13 ENCOUNTER — Other Ambulatory Visit: Payer: Self-pay | Admitting: Internal Medicine

## 2016-09-13 ENCOUNTER — Ambulatory Visit: Payer: Medicare Other | Admitting: Internal Medicine

## 2016-09-13 NOTE — Telephone Encounter (Signed)
Pt has an appt on Monday.  

## 2016-09-16 ENCOUNTER — Ambulatory Visit (INDEPENDENT_AMBULATORY_CARE_PROVIDER_SITE_OTHER): Payer: Medicare Other | Admitting: Internal Medicine

## 2016-09-16 ENCOUNTER — Encounter: Payer: Self-pay | Admitting: Internal Medicine

## 2016-09-16 ENCOUNTER — Other Ambulatory Visit (INDEPENDENT_AMBULATORY_CARE_PROVIDER_SITE_OTHER): Payer: Medicare Other

## 2016-09-16 VITALS — BP 148/80 | HR 62 | Temp 97.7°F | Resp 16 | Ht 68.0 in | Wt 209.8 lb

## 2016-09-16 DIAGNOSIS — Z7251 High risk heterosexual behavior: Secondary | ICD-10-CM | POA: Diagnosis not present

## 2016-09-16 DIAGNOSIS — N401 Enlarged prostate with lower urinary tract symptoms: Secondary | ICD-10-CM | POA: Diagnosis not present

## 2016-09-16 DIAGNOSIS — I10 Essential (primary) hypertension: Secondary | ICD-10-CM | POA: Diagnosis not present

## 2016-09-16 DIAGNOSIS — E785 Hyperlipidemia, unspecified: Secondary | ICD-10-CM | POA: Diagnosis not present

## 2016-09-16 DIAGNOSIS — R351 Nocturia: Secondary | ICD-10-CM | POA: Diagnosis not present

## 2016-09-16 DIAGNOSIS — K219 Gastro-esophageal reflux disease without esophagitis: Secondary | ICD-10-CM

## 2016-09-16 LAB — LIPID PANEL
CHOL/HDL RATIO: 6
Cholesterol: 226 mg/dL — ABNORMAL HIGH (ref 0–200)
HDL: 37.8 mg/dL — ABNORMAL LOW (ref >39.00)
NONHDL: 188.38
TRIGLYCERIDES: 289 mg/dL — AB (ref 0.0–149.0)
VLDL: 57.8 mg/dL — ABNORMAL HIGH (ref 0.0–40.0)

## 2016-09-16 LAB — CBC WITH DIFFERENTIAL/PLATELET
BASOS ABS: 0.1 10*3/uL (ref 0.0–0.1)
Basophils Relative: 0.7 % (ref 0.0–3.0)
EOS ABS: 0.4 10*3/uL (ref 0.0–0.7)
EOS PCT: 6.1 % — AB (ref 0.0–5.0)
HCT: 40.9 % (ref 39.0–52.0)
Hemoglobin: 13.8 g/dL (ref 13.0–17.0)
Lymphocytes Relative: 27.8 % (ref 12.0–46.0)
Lymphs Abs: 2 10*3/uL (ref 0.7–4.0)
MCHC: 33.7 g/dL (ref 30.0–36.0)
MCV: 94.4 fl (ref 78.0–100.0)
MONO ABS: 0.6 10*3/uL (ref 0.1–1.0)
Monocytes Relative: 7.9 % (ref 3.0–12.0)
Neutro Abs: 4.1 10*3/uL (ref 1.4–7.7)
Neutrophils Relative %: 57.5 % (ref 43.0–77.0)
Platelets: 294 10*3/uL (ref 150.0–400.0)
RBC: 4.33 Mil/uL (ref 4.22–5.81)
RDW: 14.3 % (ref 11.5–15.5)
WBC: 7.1 10*3/uL (ref 4.0–10.5)

## 2016-09-16 LAB — BASIC METABOLIC PANEL
BUN: 15 mg/dL (ref 6–23)
CHLORIDE: 101 meq/L (ref 96–112)
CO2: 31 mEq/L (ref 19–32)
CREATININE: 1.27 mg/dL (ref 0.40–1.50)
Calcium: 9.3 mg/dL (ref 8.4–10.5)
GFR: 72.61 mL/min (ref >60.00)
Glucose, Bld: 135 mg/dL — ABNORMAL HIGH (ref 70–99)
Potassium: 4.6 mEq/L (ref 3.5–5.1)
Sodium: 139 mEq/L (ref 135–145)

## 2016-09-16 LAB — TSH: TSH: 2.42 u[IU]/mL (ref 0.35–4.50)

## 2016-09-16 LAB — HIV ANTIBODY (ROUTINE TESTING W REFLEX): HIV: NONREACTIVE

## 2016-09-16 LAB — LDL CHOLESTEROL, DIRECT: LDL DIRECT: 115 mg/dL

## 2016-09-16 LAB — PSA: PSA: 1.32 ng/mL (ref 0.10–4.00)

## 2016-09-16 MED ORDER — ALFUZOSIN HCL ER 10 MG PO TB24: 10.0000 mg | ORAL_TABLET | Freq: Every day | ORAL | 3 refills | Status: DC

## 2016-09-16 NOTE — Progress Notes (Signed)
Subjective:  Patient ID: [name] [name], male    DOB: [birth year]  Age: 66 y.o. MRN: [id number]  CC: Hypertension and Hyperlipidemia   HPI [name] [name] presents for f/up - he complains of worsening urinary sx's with weak urine stream, nocturia, and dribbling. He has had a prior hx of high risk sexual activity and wants to be screened for HIV.  Outpatient Medications Prior to Visit  Medication Sig Dispense Refill  . albuterol (PROVENTIL HFA;VENTOLIN HFA) 108 (90 BASE) MCG/ACT inhaler Inhale 2 puffs into the lungs every 6 (six) hours as needed for wheezing. 18 g 5  . amLODipine (NORVASC) 5 MG tablet Take 1 tablet (5 mg total) by mouth daily. 90 tablet 3  . aspirin 81 MG chewable tablet Chew 81 mg by mouth daily.    . Cetirizine HCl 10 MG TBDP Take 1 tablet by mouth daily. 90 tablet 3  . fish oil-omega-3 fatty acids 1000 MG capsule Take 2 g by mouth daily.     Marland Kitchen glucose blood (ONETOUCH VERIO) test strip Use as instructed 2x a day   Dx- E11.21 100 each 11  . Glycopyrrolate-Formoterol (BEVESPI AEROSPHERE) 9-4.8 MCG/ACT AERO Inhale 2 Act into the lungs 2 (two) times daily. 10.7 g 11  . hydrochlorothiazide (HYDRODIURIL) 25 MG tablet TAKE ONE TABLET BY MOUTH ONCE DAILY 90 tablet 2  . Insulin Detemir (LEVEMIR FLEXPEN) 100 UNIT/ML Pen Inject 15 units once daily. 5 pen 1  . insulin regular (NOVOLIN R RELION) 100 units/mL injection Inject 3 to 6 units subcutaneously three times daily. 10 mL 2  . Insulin Syringe-Needle U-100 (EASY TOUCH INSULIN SYRINGE) 31G X 5/16" 0.3 ML MISC USE AS DIRECTED 4 TIMES DAILY 200 each 4  . linagliptin (TRADJENTA) 5 MG TABS tablet Take 1 tablet (5 mg total) by mouth daily. 30 tablet 11  . losartan (COZAAR) 100 MG tablet TAKE ONE TABLET BY MOUTH ONCE DAILY 90 tablet 3  . metFORMIN (GLUCOPHAGE) 500 MG tablet Take 1 tablet (500 mg total) by mouth 2 (two) times daily with a meal. 60 tablet 11  . metoprolol succinate (TOPROL-XL) 50 MG 24 hr tablet TAKE ONE TABLET BY MOUTH  ONCE DAILY WITH OR IMMEDIATELY FOLLOWING A MEAL 90 tablet 1  . montelukast (SINGULAIR) 10 MG tablet Take 1 tablet (10 mg total) by mouth at bedtime. 90 tablet 3  . omeprazole (PRILOSEC) 20 MG capsule TAKE ONE CAPSULE BY MOUTH ONCE DAILY 90 capsule 1  . rosuvastatin (CRESTOR) 5 MG tablet Take 5 mg by mouth 3 (three) times a week.     No facility-administered medications prior to visit.     ROS Review of Systems  Constitutional: Negative for appetite change, fatigue and fever.  HENT: Negative.  Negative for ear discharge, sore throat and trouble swallowing.   Eyes: Negative.   Respiratory: Negative.  Negative for cough, chest tightness, shortness of breath and wheezing.   Cardiovascular: Negative for chest pain, palpitations and leg swelling.  Gastrointestinal: Negative for abdominal pain, blood in stool, constipation, diarrhea, nausea and vomiting.  Endocrine: Negative for polydipsia and polyphagia.  Genitourinary: Positive for difficulty urinating. Negative for decreased urine volume, dysuria, frequency, testicular pain and urgency.  Musculoskeletal: Negative.  Negative for arthralgias, back pain, myalgias and neck pain.  Skin: Negative.  Negative for color change and rash.  Neurological: Negative.  Negative for dizziness, weakness and light-headedness.  Hematological: Negative.  Negative for adenopathy. Does not bruise/bleed easily.  Psychiatric/Behavioral: Negative.     Objective:  BP (!) 148/80 (  BP Location: Left Arm, Patient Position: Sitting, Cuff Size: Normal)   Pulse 62   Temp 97.7 F (36.5 C) (Oral)   Resp 16   Ht 5\' 8"  (1.727 m)   Wt 209 lb 12 oz (95.1 kg)   SpO2 97%   BMI 31.89 kg/m   BP Readings from Last 3 Encounters:  09/16/16 (!) 148/80  09/11/16 134/90  03/11/16 (!) 148/74    Wt Readings from Last 3 Encounters:  09/16/16 209 lb 12 oz (95.1 kg)  09/11/16 208 lb (94.3 kg)  03/11/16 212 lb (96.2 kg)    Physical Exam  Constitutional: He is oriented to  person, place, and time. No distress.  HENT:  Mouth/Throat: Oropharynx is clear and moist. No oropharyngeal exudate.  Eyes: Conjunctivae are normal. Right eye exhibits no discharge. Left eye exhibits no discharge. No scleral icterus.  Neck: Normal range of motion. Neck supple. No JVD present. No tracheal deviation present. No thyromegaly present.  Cardiovascular: Normal rate, regular rhythm, normal heart sounds and intact distal pulses.  Exam reveals no gallop and no friction rub.   No murmur heard. Pulmonary/Chest: Effort normal and breath sounds normal. No stridor. No respiratory distress. He has no wheezes. He has no rales. He exhibits no tenderness.  Abdominal: Soft. Bowel sounds are normal. He exhibits no distension and no mass. There is no tenderness. There is no rebound and no guarding.  Musculoskeletal: Normal range of motion. He exhibits no edema, tenderness or deformity.  Lymphadenopathy:    He has no cervical adenopathy.  Neurological: He is oriented to person, place, and time.  Skin: Skin is warm and dry. No rash noted. He is not diaphoretic. No erythema. No pallor.  Vitals reviewed.   Lab Results  Component Value Date   WBC 7.1 09/16/2016   HGB 13.8 09/16/2016   HCT 40.9 09/16/2016   PLT 294.0 09/16/2016   GLUCOSE 135 (H) 09/16/2016   CHOL 226 (H) 09/16/2016   TRIG 289.0 (H) 09/16/2016   HDL 37.80 (L) 09/16/2016   LDLDIRECT 115.0 09/16/2016   LDLCALC 124 (H) 12/29/2014   ALT 10 09/04/2015   AST 15 09/04/2015   NA 139 09/16/2016   K 4.6 09/16/2016   CL 101 09/16/2016   CREATININE 1.27 09/16/2016   BUN 15 09/16/2016   CO2 31 09/16/2016   TSH 2.42 09/16/2016   PSA 1.32 09/16/2016   INR 0.91 10/19/2012   HGBA1C 6.6 09/11/2016   MICROALBUR 0.9 03/11/2016    No results found.  Assessment & Plan:   Jibrael was seen today for hypertension and hyperlipidemia.  Diagnoses and all orders for this visit:  Essential hypertension, benign- His blood pressure is  adequately well controlled, electrolytes and renal function are normal, will add an alpha-blocker -     Basic metabolic panel; Future  Hyperlipidemia with target LDL less than 100- he has achieved his LDL goal is doing well on the statin. -     Lipid panel; Future -     TSH; Future  BPH associated with nocturia- his PSA is not elevated so I'm not concerned about prostate cancer. Will treat his symptoms with an alpha blocker. -     PSA; Future -     alfuzosin (UROXATRAL) 10 MG 24 hr tablet; Take 1 tablet (10 mg total) by mouth daily with breakfast.  Gastroesophageal reflux disease without esophagitis- he has no alarm features and is not anemic, will continue the PPI -     CBC with Differential/Platelet; Future  High-risk sexual behavior- I will screen for HIV infection. -     HIV antibody; Future   I am having Mr. Kirchmeier start on alfuzosin. I am also having him maintain his fish oil-omega-3 fatty acids, aspirin, amLODipine, albuterol, metoprolol succinate, rosuvastatin, Cetirizine HCl, hydrochlorothiazide, insulin regular, losartan, montelukast, Glycopyrrolate-Formoterol, Insulin Syringe-Needle U-100, Insulin Detemir, omeprazole, linagliptin, metFORMIN, and glucose blood.  Meds ordered this encounter  Medications  . alfuzosin (UROXATRAL) 10 MG 24 hr tablet    Sig: Take 1 tablet (10 mg total) by mouth daily with breakfast.    Dispense:  90 tablet    Refill:  3     Follow-up: Return in about 4 months (around 01/14/2017).  Scarlette Calico, MD

## 2016-09-16 NOTE — Patient Instructions (Signed)
Hypertension Hypertension, commonly called high blood pressure, is when the force of blood pumping through your arteries is too strong. Your arteries are the blood vessels that carry blood from your heart throughout your body. A blood pressure reading consists of a higher number over a lower number, such as 110/72. The higher number (systolic) is the pressure inside your arteries when your heart pumps. The lower number (diastolic) is the pressure inside your arteries when your heart relaxes. Ideally you want your blood pressure below 120/80. Hypertension forces your heart to work harder to pump blood. Your arteries may become narrow or stiff. Having untreated or uncontrolled hypertension can cause heart attack, stroke, kidney disease, and other problems. What increases the risk? Some risk factors for high blood pressure are controllable. Others are not. Risk factors you cannot control include:  Race. You may be at higher risk if you are African American.  Age. Risk increases with age.  Gender. Men are at higher risk than women before age 45 years. After age 65, women are at higher risk than men. Risk factors you can control include:  Not getting enough exercise or physical activity.  Being overweight.  Getting too much fat, sugar, calories, or salt in your diet.  Drinking too much alcohol. What are the signs or symptoms? Hypertension does not usually cause signs or symptoms. Extremely high blood pressure (hypertensive crisis) may cause headache, anxiety, shortness of breath, and nosebleed. How is this diagnosed? To check if you have hypertension, your health care provider will measure your blood pressure while you are seated, with your arm held at the level of your heart. It should be measured at least twice using the same arm. Certain conditions can cause a difference in blood pressure between your right and left arms. A blood pressure reading that is higher than normal on one occasion does  not mean that you need treatment. If it is not clear whether you have high blood pressure, you may be asked to return on a different day to have your blood pressure checked again. Or, you may be asked to monitor your blood pressure at home for 1 or more weeks. How is this treated? Treating high blood pressure includes making lifestyle changes and possibly taking medicine. Living a healthy lifestyle can help lower high blood pressure. You may need to change some of your habits. Lifestyle changes may include:  Following the DASH diet. This diet is high in fruits, vegetables, and whole grains. It is low in salt, red meat, and added sugars.  Keep your sodium intake below 2,300 mg per day.  Getting at least 30-45 minutes of aerobic exercise at least 4 times per week.  Losing weight if necessary.  Not smoking.  Limiting alcoholic beverages.  Learning ways to reduce stress. Your health care provider may prescribe medicine if lifestyle changes are not enough to get your blood pressure under control, and if one of the following is true:  You are 18-59 years of age and your systolic blood pressure is above 140.  You are 60 years of age or older, and your systolic blood pressure is above 150.  Your diastolic blood pressure is above 90.  You have diabetes, and your systolic blood pressure is over 140 or your diastolic blood pressure is over 90.  You have kidney disease and your blood pressure is above 140/90.  You have heart disease and your blood pressure is above 140/90. Your personal target blood pressure may vary depending on your medical   conditions, your age, and other factors. Follow these instructions at home:  Have your blood pressure rechecked as directed by your health care provider.  Take medicines only as directed by your health care provider. Follow the directions carefully. Blood pressure medicines must be taken as prescribed. The medicine does not work as well when you skip  doses. Skipping doses also puts you at risk for problems.  Do not smoke.  Monitor your blood pressure at home as directed by your health care provider. Contact a health care provider if:  You think you are having a reaction to medicines taken.  You have recurrent headaches or feel dizzy.  You have swelling in your ankles.  You have trouble with your vision. Get help right away if:  You develop a severe headache or confusion.  You have unusual weakness, numbness, or feel faint.  You have severe chest or abdominal pain.  You vomit repeatedly.  You have trouble breathing. This information is not intended to replace advice given to you by your health care provider. Make sure you discuss any questions you have with your health care provider. Document Released: 07/15/2005 Document Revised: 12/21/2015 Document Reviewed: 05/07/2013 Elsevier Interactive Patient Education  2017 Elsevier Inc.  

## 2016-09-16 NOTE — Progress Notes (Signed)
Pre visit review using our clinic review tool, if applicable. No additional management support is needed unless otherwise documented below in the visit note. 

## 2016-09-17 ENCOUNTER — Encounter: Payer: Self-pay | Admitting: Internal Medicine

## 2016-09-19 ENCOUNTER — Other Ambulatory Visit: Payer: Self-pay | Admitting: Internal Medicine

## 2016-10-08 ENCOUNTER — Telehealth: Payer: Self-pay | Admitting: Internal Medicine

## 2016-10-08 NOTE — Telephone Encounter (Signed)
Pt called in and wanted to let you know that [employer] is sending a diabetic shoe prescription over for him, and he would like a call back when this is done for him. CB# [phone]

## 2016-10-08 NOTE — Telephone Encounter (Signed)
Pt called in and said that he is needing a script called in for crestor or the generic ?  He is going to call his ins compy and see what they cover

## 2016-10-08 NOTE — Telephone Encounter (Signed)
Placed forms on Dr.Gherghe's desk.

## 2016-10-09 MED ORDER — ROSUVASTATIN CALCIUM 5 MG PO TABS: 5.0000 mg | ORAL_TABLET | ORAL | 1 refills | Status: DC

## 2016-10-09 NOTE — Telephone Encounter (Signed)
erx sent

## 2016-10-17 ENCOUNTER — Ambulatory Visit: Payer: Medicare Other | Admitting: Internal Medicine

## 2016-11-14 ENCOUNTER — Telehealth: Payer: Self-pay | Admitting: Internal Medicine

## 2016-11-14 MED ORDER — GLUCOSE BLOOD VI STRP: ORAL_STRIP | 5 refills | Status: DC

## 2016-11-14 NOTE — Telephone Encounter (Signed)
Ok to send Rx with 3x a day testing.

## 2016-11-14 NOTE — Addendum Note (Signed)
Addended by: Verlin Grills T on: 11/14/2016 03:16 PM   Modules accepted: Orders

## 2016-11-14 NOTE — Telephone Encounter (Signed)
Patient called to advise that the rx for his glucose blood (ONETOUCH VERIO) test strip indicates that he should be tested 2x daily and formally he had been testing 3x day. With the rx being set at testing 2x day it has restrictions on the renewal of strips. Patient requests a rx for the 3x day strips.   [place], [place] (Phone) [phone] (Fax)   Please call patient to advise at [phone].

## 2016-11-14 NOTE — Telephone Encounter (Signed)
See message and please advise if ok to increase to three times per day. Thanks!

## 2016-11-14 NOTE — Telephone Encounter (Signed)
Patient notified of message. Pateint voiced understanding.

## 2016-11-26 ENCOUNTER — Telehealth: Payer: Self-pay | Admitting: *Deleted

## 2016-11-26 NOTE — Telephone Encounter (Signed)
Left msg on triage wanting to know if MD had samples of the Bevespi inhaler...[name] [name]

## 2016-11-26 NOTE — Telephone Encounter (Signed)
Yes I do. 

## 2016-11-27 NOTE — Telephone Encounter (Signed)
Notified pt samples ready for pick-up../lmb 

## 2016-12-02 ENCOUNTER — Other Ambulatory Visit: Payer: Self-pay

## 2016-12-02 MED ORDER — METFORMIN HCL 500 MG PO TABS: 500.0000 mg | ORAL_TABLET | Freq: Two times a day (BID) | ORAL | 0 refills | Status: DC

## 2016-12-05 ENCOUNTER — Telehealth: Payer: Self-pay | Admitting: Internal Medicine

## 2016-12-05 ENCOUNTER — Ambulatory Visit: Payer: Medicare Other | Admitting: Internal Medicine

## 2016-12-05 NOTE — Telephone Encounter (Signed)
No charge per Dr. Gessner. 

## 2017-01-10 ENCOUNTER — Ambulatory Visit: Payer: Medicare Other | Admitting: Internal Medicine

## 2017-02-05 ENCOUNTER — Telehealth: Payer: Self-pay | Admitting: Internal Medicine

## 2017-02-05 ENCOUNTER — Ambulatory Visit: Payer: Medicare Other | Admitting: Internal Medicine

## 2017-02-05 ENCOUNTER — Telehealth: Payer: Self-pay

## 2017-02-05 NOTE — Telephone Encounter (Signed)
No Charge per Dr. Gessner. 

## 2017-02-05 NOTE — Telephone Encounter (Signed)
Pt schedule AWV for 02/28/2017.

## 2017-02-05 NOTE — Telephone Encounter (Signed)
Patient is on the list for [employer] 2018 and may be a good candidate for an AWV. Please let me know if/when appt is scheduled.   

## 2017-02-07 ENCOUNTER — Other Ambulatory Visit: Payer: Self-pay | Admitting: Internal Medicine

## 2017-02-17 ENCOUNTER — Other Ambulatory Visit: Payer: Self-pay

## 2017-02-24 ENCOUNTER — Other Ambulatory Visit: Payer: Self-pay | Admitting: Internal Medicine

## 2017-02-28 ENCOUNTER — Ambulatory Visit (INDEPENDENT_AMBULATORY_CARE_PROVIDER_SITE_OTHER): Payer: Medicare Other | Admitting: *Deleted

## 2017-02-28 VITALS — BP 152/78 | HR 51 | Resp 20 | Ht 68.0 in | Wt 204.0 lb

## 2017-02-28 DIAGNOSIS — Z23 Encounter for immunization: Secondary | ICD-10-CM | POA: Diagnosis not present

## 2017-02-28 DIAGNOSIS — Z0000 Encounter for general adult medical examination without abnormal findings: Secondary | ICD-10-CM | POA: Diagnosis not present

## 2017-02-28 NOTE — Progress Notes (Signed)
Pre visit review using our clinic review tool, if applicable. No additional management support is needed unless otherwise documented below in the visit note. 

## 2017-02-28 NOTE — Patient Instructions (Signed)
Continue doing brain stimulating activities (puzzles, reading, adult coloring books, staying active) to keep memory sharp.   Continue to eat heart healthy diet (full of fruits, vegetables, whole grains, lean protein, water--limit salt, fat, and sugar intake) and increase physical activity as tolerated.   [name] [name] , Thank you for taking time to come for your Medicare Wellness Visit. I appreciate your ongoing commitment to your health goals. Please review the following plan we discussed and let me know if I can assist you in the future.   These are the goals we discussed: Goals    . <enter goal here>    . 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 Pneumonia vaccines (2 of 2 - PPSV23) 03/16/2015  . Eye exam for diabetics  03/30/2015  . Complete foot exam   08/23/2016  . Flu Shot  02/26/2017  . Hemoglobin A1C  03/11/2017  . Colon Cancer Screening  08/10/2019  . Tetanus Vaccine  09/03/2025  .  Hepatitis C: One time screening is recommended by Center for Disease Control  (CDC) for  adults born from [birth year] through 1967.   Completed

## 2017-02-28 NOTE — Progress Notes (Addendum)
Subjective:   [name] [name] is a 66 y.o. male who presents for an Initial Medicare Annual Wellness Visit.  Review of Systems  No ROS.  Medicare Wellness Visit. Additional risk factors are reflected in the social history.    Sleep patterns: feels rested on waking, gets up 1 times nightly to void, sleep 6 hours per night which is patient's baseline    Home Safety/Smoke Alarms: Feels safe in home. Smoke alarms in place.  Living environment; residence and Firearm Safety: 2-story house, no firearms.Lives alone, no needs for DME, good support system Seat Belt Safety/Bike Helmet: Wears seat belt.   Counseling:   Eye Exam- appointment yearly, Dr. Zigmund Daniel  Dental- States he will make an appointment   Male:   CCS- Last 08/09/09, recall 10 years     PSA-  Lab Results  Component Value Date   PSA 1.32 09/16/2016   PSA 1.79 03/11/2016   PSA 1.28 03/15/2014      Objective:    There were no vitals filed for this visit. There is no height or weight on file to calculate BMI.  Current Medications (verified) Outpatient Encounter Prescriptions as of 02/28/2017  Medication Sig  . albuterol (PROVENTIL HFA;VENTOLIN HFA) 108 (90 BASE) MCG/ACT inhaler Inhale 2 puffs into the lungs every 6 (six) hours as needed for wheezing.  Marland Kitchen alfuzosin (UROXATRAL) 10 MG 24 hr tablet Take 1 tablet (10 mg total) by mouth daily with breakfast.  . amLODipine (NORVASC) 5 MG tablet Take 1 tablet (5 mg total) by mouth daily.  Marland Kitchen aspirin 81 MG chewable tablet Chew 81 mg by mouth daily.  . Cetirizine HCl 10 MG TBDP Take 1 tablet by mouth daily.  . fish oil-omega-3 fatty acids 1000 MG capsule Take 2 g by mouth daily.   Marland Kitchen glucose blood (ONETOUCH VERIO) test strip Use as instructed 3x a day   Dx- E11.21  . Glycopyrrolate-Formoterol (BEVESPI AEROSPHERE) 9-4.8 MCG/ACT AERO Inhale 2 Act into the lungs 2 (two) times daily.  . hydrochlorothiazide (HYDRODIURIL) 25 MG tablet TAKE ONE TABLET BY MOUTH ONCE DAILY  . Insulin  Detemir (LEVEMIR FLEXPEN) 100 UNIT/ML Pen Inject 15 units once daily.  . Insulin Syringe-Needle U-100 (EASY TOUCH INSULIN SYRINGE) 31G X 5/16" 0.3 ML MISC USE AS DIRECTED 4 TIMES DAILY  . linagliptin (TRADJENTA) 5 MG TABS tablet Take 1 tablet (5 mg total) by mouth daily.  Marland Kitchen losartan (COZAAR) 100 MG tablet TAKE ONE TABLET BY MOUTH ONCE DAILY  . metFORMIN (GLUCOPHAGE) 500 MG tablet Take 1 tablet (500 mg total) by mouth 2 (two) times daily with a meal.  . metoprolol succinate (TOPROL-XL) 50 MG 24 hr tablet TAKE ONE TABLET BY MOUTH ONCE DAILY WITH OR IMMEDIATELY FOLLOWING A MEAL  . montelukast (SINGULAIR) 10 MG tablet Take 1 tablet (10 mg total) by mouth at bedtime.  Marland Kitchen NOVOLIN R RELION 100 UNIT/ML injection INJECT THREE TO SIX UNITS SUBCUTANEOUSLY THREE TIMES DAILY  . omeprazole (PRILOSEC) 20 MG capsule TAKE ONE CAPSULE BY MOUTH ONCE DAILY  . rosuvastatin (CRESTOR) 5 MG tablet Take 1 tablet (5 mg total) by mouth 3 (three) times a week.   No facility-administered encounter medications on file as of 02/28/2017.     Allergies (verified) Hydralazine; Lisinopril; Penicillins; and Tribenzor [olmesartan-amlodipine-hctz]   History: Past Medical History:  Diagnosis Date  . Allergy   . Asthma   . Atrial fibrillation ([place])   . Cardiomyopathy 2011   Patient was told that he had an ejection fraction of 37% by  echo done elsewhere in 2011  . COPD (chronic obstructive pulmonary disease) ([place])   . Diabetes mellitus without complication ([place])   . Diverticulitis   . GERD (gastroesophageal reflux disease)   . History of colon polyps   . Hypertension   . Tobacco abuse    Past Surgical History:  Procedure Laterality Date  . COLON SURGERY  2009  . COLONOSCOPY     Family History  Problem Relation Age of Onset  . Prostate cancer Father   . Colon cancer Father   . Diabetes type II Mother   . Atrial fibrillation Mother   . Hypertension Mother   . Arthritis Other        parents  . Cancer Other         Colon,Breast, Prostate Cancer  . Hypertension Other   . Diabetes Other        parents  . Prostate cancer Brother        5 brothers - 2 were dx with prostate cancer  . Esophageal cancer Neg Hx   . Stomach cancer Neg Hx   . Rectal cancer Neg Hx    Social History   Occupational History  . Not on file.   Social History Main Topics  . Smoking status: Former Smoker    Types: Cigarettes    Quit date: 07/30/2011  . Smokeless tobacco: Never Used     Comment: Smoked since age 55  . Alcohol use 0.5 oz/week    1 Standard drinks or equivalent per week  . Drug use: No  . Sexual activity: Not Currently   Tobacco Counseling Counseling given: Not Answered   Activities of Daily Living No flowsheet data found.  Immunizations and Health Maintenance Immunization History  Administered Date(s) Administered  . Influenza Split 04/29/2011  . Influenza, High Dose Seasonal PF 04/30/2016  . Influenza,inj,Quad PF,36+ Mos 05/18/2013, 06/02/2014, 04/25/2015  . Pneumococcal Conjugate-13 03/15/2014  . Pneumococcal Polysaccharide-23 07/29/2009  . Tdap 07/29/2005, 09/04/2015   Health Maintenance Due  Topic Date Due  . PNA vac Low Risk Adult (2 of 2 - PPSV23) 03/16/2015  . OPHTHALMOLOGY EXAM  03/30/2015  . FOOT EXAM  08/23/2016  . INFLUENZA VACCINE  02/26/2017    Patient Care Team: Janith Lima, MD as PCP - General (Internal Medicine)  Indicate any recent Medical Services you may have received from other than [facility] providers in the past year (date may be approximate).    Assessment:   This is a routine wellness examination for [name] [name]. Physical assessment deferred to PCP.   Hearing/Vision screen No exam data present  Dietary issues and exercise activities discussed:   Diet (meal preparation, eat out, water intake, caffeinated beverages, dairy products, fruits and vegetables): in general, a "healthy" diet  , well balanced eats a variety of fruits and vegetables daily, limits salt,  fat/cholesterol, sugar, caffeine, drinks 6-8 glasses of water daily.  Reviewed heart healthy and diabetic diet     Goals    None     Depression Screen PHQ 2/9 Scores 03/15/2014  PHQ - 2 Score 0    Fall Risk Fall Risk  03/15/2014  Falls in the past year? No    Cognitive Function:       Ad8 score reviewed for issues:  Issues making decisions: no  Less interest in hobbies / activities: no  Repeats questions, stories (family complaining): no  Trouble using ordinary gadgets (microwave, computer, phone):no  Forgets the month or year: no  Mismanaging finances: no  Remembering appts: no  Daily problems with thinking and/or memory: no Ad8 score is= 0    Screening Tests Health Maintenance  Topic Date Due  . PNA vac Low Risk Adult (2 of 2 - PPSV23) 03/16/2015  . OPHTHALMOLOGY EXAM  03/30/2015  . FOOT EXAM  08/23/2016  . INFLUENZA VACCINE  02/26/2017  . HEMOGLOBIN A1C  03/11/2017  . COLONOSCOPY  08/10/2019  . TETANUS/TDAP  09/03/2025  . Hepatitis C Screening  Completed        Plan:    Continue doing brain stimulating activities (puzzles, reading, adult coloring books, staying active) to keep memory sharp.   Continue to eat heart healthy diet (full of fruits, vegetables, whole grains, lean protein, water--limit salt, fat, and sugar intake) and increase physical activity as tolerated.  I have personally reviewed and noted the following in the patient's chart:   . Medical and social history . Use of alcohol, tobacco or illicit drugs  . Current medications and supplements . Functional ability and status . Nutritional status . Physical activity . Advanced directives . List of other physicians . Vitals . Screenings to include cognitive, depression, and falls . Referrals and appointments  In addition, I have reviewed and discussed with patient certain preventive protocols, quality metrics, and best practice recommendations. A written personalized care plan for  preventive services as well as general preventive health recommendations were provided to patient.     Michiel Cowboy, RN   02/28/2017      Medical screening examination/treatment/procedure(s) were performed by non-physician practitioner and as supervising physician I was immediately available for consultation/collaboration. I agree with above. Binnie Rail, MD

## 2017-03-09 ENCOUNTER — Other Ambulatory Visit: Payer: Self-pay | Admitting: Internal Medicine

## 2017-03-22 ENCOUNTER — Encounter (HOSPITAL_COMMUNITY): Payer: Self-pay | Admitting: *Deleted

## 2017-03-22 ENCOUNTER — Emergency Department (HOSPITAL_COMMUNITY)
Admission: EM | Admit: 2017-03-22 | Discharge: 2017-03-23 | Disposition: A | Discharge status: Home / Self Care | Payer: Medicare Other | Attending: Emergency Medicine | Admitting: Emergency Medicine

## 2017-03-22 DIAGNOSIS — I10 Essential (primary) hypertension: Secondary | ICD-10-CM | POA: Diagnosis not present

## 2017-03-22 DIAGNOSIS — R7989 Other specified abnormal findings of blood chemistry: Secondary | ICD-10-CM | POA: Insufficient documentation

## 2017-03-22 DIAGNOSIS — E1121 Type 2 diabetes mellitus with diabetic nephropathy: Secondary | ICD-10-CM | POA: Insufficient documentation

## 2017-03-22 DIAGNOSIS — J449 Chronic obstructive pulmonary disease, unspecified: Secondary | ICD-10-CM | POA: Insufficient documentation

## 2017-03-22 DIAGNOSIS — J45909 Unspecified asthma, uncomplicated: Secondary | ICD-10-CM | POA: Insufficient documentation

## 2017-03-22 DIAGNOSIS — Z794 Long term (current) use of insulin: Secondary | ICD-10-CM | POA: Insufficient documentation

## 2017-03-22 DIAGNOSIS — Z87891 Personal history of nicotine dependence: Secondary | ICD-10-CM | POA: Insufficient documentation

## 2017-03-22 DIAGNOSIS — R109 Unspecified abdominal pain: Secondary | ICD-10-CM

## 2017-03-22 DIAGNOSIS — R1032 Left lower quadrant pain: Secondary | ICD-10-CM | POA: Insufficient documentation

## 2017-03-22 LAB — URINALYSIS, ROUTINE W REFLEX MICROSCOPIC
BILIRUBIN URINE: NEGATIVE
Bacteria, UA: NONE SEEN
Glucose, UA: NEGATIVE mg/dL
Ketones, ur: NEGATIVE mg/dL
Leukocytes, UA: NEGATIVE
NITRITE: NEGATIVE
Protein, ur: NEGATIVE mg/dL
SQUAMOUS EPITHELIAL / LPF: NONE SEEN
Specific Gravity, Urine: 1.014 (ref 1.005–1.030)
pH: 6 (ref 5.0–8.0)

## 2017-03-22 LAB — BASIC METABOLIC PANEL
Anion gap: 11 (ref 5–15)
BUN: 31 mg/dL — AB (ref 6–20)
CALCIUM: 9.5 mg/dL (ref 8.9–10.3)
CHLORIDE: 104 mmol/L (ref 101–111)
CO2: 25 mmol/L (ref 22–32)
CREATININE: 1.93 mg/dL — AB (ref 0.61–1.24)
GFR calc Af Amer: 39 mL/min — ABNORMAL LOW (ref >60)
GFR calc non Af Amer: 34 mL/min — ABNORMAL LOW (ref >60)
GLUCOSE: 161 mg/dL — AB (ref 65–99)
Potassium: 4 mmol/L (ref 3.5–5.1)
Sodium: 140 mmol/L (ref 135–145)

## 2017-03-22 LAB — CBC
HCT: 39.6 % (ref 39.0–52.0)
Hemoglobin: 12.9 g/dL — ABNORMAL LOW (ref 13.0–17.0)
MCH: 31.1 pg (ref 26.0–34.0)
MCHC: 32.6 g/dL (ref 30.0–36.0)
MCV: 95.4 fL (ref 78.0–100.0)
PLATELETS: 314 10*3/uL (ref 150–400)
RBC: 4.15 MIL/uL — AB (ref 4.22–5.81)
RDW: 13.5 % (ref 11.5–15.5)
WBC: 13.5 10*3/uL — ABNORMAL HIGH (ref 4.0–10.5)

## 2017-03-22 NOTE — Discharge Instructions (Signed)
Please read and follow all provided instructions.  Your diagnoses today include:  1. Flank pain   2. Elevated serum creatinine     Tests performed today include:  Urine test that showed blood in your urine and no infection  Blood test that showed elevated kidney function - needs to be rechecked by your doctor  Vital signs. See below for your results today.   Medications prescribed:   None  Take any prescribed medications only as directed.  Home care instructions:  Follow any educational materials contained in this packet.  Please double your fluid intake for the next several days. Strain your urine and save any stones that may pass.   BE VERY CAREFUL not to take multiple medicines containing Tylenol (also called acetaminophen). Doing so can lead to an overdose which can damage your liver and cause liver failure and possibly death.   Follow-up instructions: Follow-up with your doctor in 5 days for a recheck of your kidney function and symptoms.  Return instructions:   Please return to the Emergency Department if you experience worsening symptoms worth your pain returns. Please return if you develop fever or uncontrolled pain or vomiting.  Please return if you have any other emergent concerns.  Additional Information:  Your vital signs today were: BP 125/71 (BP Location: Right Arm)    Pulse (!) 52    Temp 97.8 F (36.6 C) (Oral)    Resp 16    Ht 5\' 9"  (1.753 m)    Wt 91.2 kg (201 lb)    SpO2 98%    BMI 29.68 kg/m  If your blood pressure (BP) was elevated above 135/85 this visit, please have this repeated by your doctor within one month. --------------

## 2017-03-22 NOTE — ED Notes (Signed)
Patient updated on delays

## 2017-03-22 NOTE — ED Triage Notes (Signed)
Pt is here for left flank pain which he describes as "soreness and sharp shooting pain".  Pt states that this began yesterday, he is concerned about kidney stones but has no hx of same.  Pt states that that he has burning at the tip of his penis and urgency and frequency.

## 2017-03-23 NOTE — ED Provider Notes (Signed)
[facility] DEPT Provider Note   CSN: [id number] Arrival date & time: 03/22/17  1909     History   Chief Complaint Chief Complaint  Patient presents with  . Flank Pain    HPI Herrick Hartog is a 66 y.o. male.  Patient with no past history of kidney stones, diabetes, previous surgery for diverticulitis -- presents with complaint of left flank pain which started as a soreness yesterday. Symptoms were milder at first but steadily became worse today. Pain was severe at times. Between 3 PM and 6 PM it was associated with nausea and vomiting. During this time the pain moved to the right testicle area. Patient had increased urgency and frequency as well. No fevers, chest pain, shortness of breath. Patient was in the waiting room for several hours. Approximately 45 minutes before being moved to an exam room, his symptoms completely resolved. He has no further abdominal pain, nausea or vomiting. He is moving without any difficulty. No urinary symptoms. No dysuria during most recent urination. No relief with over-the-counter meds at home.      Past Medical History:  Diagnosis Date  . Allergy   . Asthma   . Atrial fibrillation ([place])   . Cardiomyopathy 2011   Patient was told that he had an ejection fraction of 37% by echo done elsewhere in 2011  . COPD (chronic obstructive pulmonary disease) ([place])   . Diabetes mellitus without complication ([place])   . Diverticulitis   . GERD (gastroesophageal reflux disease)   . History of colon polyps   . Hypertension   . Tobacco abuse     Patient Active Problem List   Diagnosis Date Noted  . Obesity (BMI 30.0-34.9) 03/11/2016  . Allergic rhinitis due to pollen 09/04/2015  . Type 2 diabetes mellitus with diabetic nephropathy, with long-term current use of insulin ([place]) 08/24/2015  . History of colonic polyps 12/29/2014  . 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 Tobacco abuse 06/10/2014  . BPH associated with nocturia 03/15/2014  . Routine general  medical examination at a health care facility 11/05/2012  . Atrial fibrillation ([place]) 10/19/2012  . Hyperlipidemia with target LDL less than 100 05/05/2012  . GERD (gastroesophageal reflux disease)   . COPD (chronic obstructive pulmonary disease) ([place])   . Cardiomyopathy ([place])   . Essential hypertension, benign 06/26/2011    Past Surgical History:  Procedure Laterality Date  . COLON SURGERY  2009  . COLONOSCOPY         Home Medications    Prior to Admission medications   Medication Sig Start Date End Date Taking? Authorizing Provider  albuterol (PROVENTIL HFA;VENTOLIN HFA) 108 (90 BASE) MCG/ACT inhaler Inhale 2 puffs into the lungs every 6 (six) hours as needed for wheezing. 04/14/15   Janith Lima, MD  amLODipine (NORVASC) 5 MG tablet Take 1 tablet (5 mg total) by mouth daily. 12/27/14   Lelon Perla, MD  aspirin 81 MG chewable tablet Chew 81 mg by mouth daily.    [provider]  fish oil-omega-3 fatty acids 1000 MG capsule Take 2 g by mouth daily.     [provider]  glucose blood (ONETOUCH VERIO) test strip Use as instructed 3x a day   Dx- E11.21 11/14/16   Philemon Kingdom, MD  Glycopyrrolate-Formoterol (BEVESPI AEROSPHERE) 9-4.8 MCG/ACT AERO Inhale 2 Act into the lungs 2 (two) times daily. 03/11/16   Janith Lima, MD  hydrochlorothiazide (HYDRODIURIL) 25 MG tablet TAKE 1 TABLET BY MOUTH ONCE DAILY 03/10/17   Ronnald Ramp,  Arvid Right, MD  Insulin Detemir (LEVEMIR FLEXPEN) 100 UNIT/ML Pen Inject 15 units once daily. 07/31/16   Philemon Kingdom, MD  Insulin Syringe-Needle U-100 (EASY TOUCH INSULIN SYRINGE) 31G X 5/16" 0.3 ML MISC USE AS DIRECTED 4 TIMES DAILY 05/01/16   Philemon Kingdom, MD  linagliptin (TRADJENTA) 5 MG TABS tablet Take 1 tablet (5 mg total) by mouth daily. 09/11/16   Philemon Kingdom, MD  losartan (COZAAR) 100 MG tablet TAKE ONE TABLET BY MOUTH ONCE DAILY 03/10/17   Janith Lima, MD  metFORMIN (GLUCOPHAGE) 500 MG tablet Take 1 tablet (500 mg  total) by mouth 2 (two) times daily with a meal. 12/02/16   Philemon Kingdom, MD  metoprolol succinate (TOPROL-XL) 50 MG 24 hr tablet TAKE ONE TABLET BY MOUTH ONCE DAILY WITH OR IMMEDIATELY FOLLOWING A MEAL 04/16/15   Janith Lima, MD  NOVOLIN R RELION 100 UNIT/ML injection INJECT THREE TO SIX UNITS SUBCUTANEOUSLY THREE TIMES DAILY 02/24/17   Philemon Kingdom, MD  omeprazole (PRILOSEC) 20 MG capsule TAKE ONE CAPSULE BY MOUTH ONCE DAILY 02/07/17   Janith Lima, MD  rosuvastatin (CRESTOR) 5 MG tablet Take 1 tablet (5 mg total) by mouth 3 (three) times a week. 10/09/16   Janith Lima, MD    Family History Family History  Problem Relation Age of Onset  . Prostate cancer Father   . Colon cancer Father   . Diabetes type II Mother   . Atrial fibrillation Mother   . Hypertension Mother   . Arthritis Other        parents  . Cancer Other        Colon,Breast, Prostate Cancer  . Hypertension Other   . Diabetes Other        parents  . Prostate cancer Brother        5 brothers - 2 were dx with prostate cancer  . Esophageal cancer Neg Hx   . Stomach cancer Neg Hx   . Rectal cancer Neg Hx     Social History Social History  Substance Use Topics  . Smoking status: Former Smoker    Types: Cigarettes    Quit date: 07/30/2011  . Smokeless tobacco: Never Used     Comment: Smoked since age 56  . Alcohol use 0.5 oz/week    1 Standard drinks or equivalent per week     Allergies   Hydralazine; Lisinopril; Penicillins; and Tribenzor [olmesartan-amlodipine-hctz]   Review of Systems Review of Systems  Constitutional: Negative for fever.  HENT: Negative for rhinorrhea and sore throat.   Eyes: Negative for redness.  Respiratory: Negative for cough.   Cardiovascular: Negative for chest pain.  Gastrointestinal: Positive for nausea and vomiting. Negative for abdominal pain and diarrhea.  Genitourinary: Positive for flank pain, frequency and urgency. Negative for dysuria and hematuria.    Musculoskeletal: Negative for myalgias.  Skin: Negative for rash.  Neurological: Negative for headaches.     Physical Exam Updated Vital Signs BP 125/69   Pulse (!) 45   Temp 97.8 F (36.6 C) (Oral)   Resp 18   Ht 5\' 9"  (1.753 m)   Wt 91.2 kg (201 lb)   SpO2 100%   BMI 29.68 kg/m   Physical Exam  Constitutional: He appears well-developed and well-nourished.  HENT:  Head: Normocephalic and atraumatic.  Eyes: Conjunctivae are normal. Right eye exhibits no discharge. Left eye exhibits no discharge.  Neck: Normal range of motion. Neck supple.  Cardiovascular: Normal rate, regular rhythm and normal heart sounds.  Pulmonary/Chest: Effort normal and breath sounds normal.  Abdominal: Soft. He exhibits no pulsatile midline mass. There is no tenderness. There is no CVA tenderness.  Neurological: He is alert.  Skin: Skin is warm and dry.  Psychiatric: He has a normal mood and affect.  Nursing note and vitals reviewed.    ED Treatments / Results  Labs (all labs ordered are listed, but only abnormal results are displayed) Labs Reviewed  URINALYSIS, ROUTINE W REFLEX MICROSCOPIC - Abnormal; Notable for the following:       Result Value   Color, Urine STRAW (*)    Hgb urine dipstick SMALL (*)    All other components within normal limits  BASIC METABOLIC PANEL - Abnormal; Notable for the following:    Glucose, Bld 161 (*)    BUN 31 (*)    Creatinine, Ser 1.93 (*)    GFR calc non Af Amer 34 (*)    GFR calc Af Amer 39 (*)    All other components within normal limits  CBC - Abnormal; Notable for the following:    WBC 13.5 (*)    RBC 4.15 (*)    Hemoglobin 12.9 (*)    All other components within normal limits    Procedures Procedures (including critical care time)  Medications Ordered in ED Medications - No data to display   Initial Impression / Assessment and Plan / ED Course  I have reviewed the triage vital signs and the nursing notes.  Pertinent labs & imaging  results that were available during my care of the patient were reviewed by me and considered in my medical decision making (see chart for details).     Patient seen and examined. I reviewed lab findings with patient. He gives a very good story consistent with ureteral colic. He has no symptoms at time of exam. Slightly elevated white count but no fever. Urine without signs of infection. Slightly elevated creatinine.  Patient informed of all results. He will follow-up with his primary care physician regarding elevated creatinine have this rechecked.  Vital signs reviewed and are as follows: BP 125/69   Pulse (!) 45   Temp 97.8 F (36.6 C) (Oral)   Resp 18   Ht 5\' 9"  (1.753 m)   Wt 91.2 kg (201 lb)   SpO2 100%   BMI 29.68 kg/m   I discussed the case with Dr. Sabra Heck prior to discharge.  Patient told to return immediately with fever, vomiting, return of abdominal pain. If his symptoms return, he will need CT of the abdomen and pelvis to confirm stone. Patient feels comfortable and is in agreement with plan. He had soda and crackers prior to discharge.   Final Clinical Impressions(s) / ED Diagnoses   Final diagnoses:  Flank pain  Elevated serum creatinine   Patient with left-sided flank pain consistent with ureteral colic. No previous diagnosis. Character and location of pain with movement eventually to the scrotal area and abrupt cessation suggests that stone moved through the ureter and is now in the bladder. At time of exam patient is completely asymptomatic. He has no reproducible abdominal pain, CVA tenderness, or symptoms suggestive of aortic abdominal aneurysm. Vitals are stable. Patient agrees to return with return of symptoms. Do not feel that in light of this history and resolution of symptoms that patient requires any emergent imaging at this time. Strict return instructions as above.   New Prescriptions Discharge Medication List as of 03/22/2017 11:46 PM       Sekou Zuckerman,  Jinny Sanders 03/23/17 Jimmy Footman    Noemi Chapel, MD 04/02/17 [phone]

## 2017-04-02 ENCOUNTER — Ambulatory Visit: Payer: Medicare Other | Admitting: Internal Medicine

## 2017-04-03 ENCOUNTER — Ambulatory Visit (INDEPENDENT_AMBULATORY_CARE_PROVIDER_SITE_OTHER): Payer: Medicare Other | Admitting: Internal Medicine

## 2017-04-03 ENCOUNTER — Other Ambulatory Visit (INDEPENDENT_AMBULATORY_CARE_PROVIDER_SITE_OTHER): Payer: Medicare Other

## 2017-04-03 ENCOUNTER — Encounter: Payer: Self-pay | Admitting: Internal Medicine

## 2017-04-03 VITALS — BP 144/80 | HR 74 | Temp 98.5°F | Resp 16 | Ht 69.0 in | Wt 205.5 lb

## 2017-04-03 DIAGNOSIS — I10 Essential (primary) hypertension: Secondary | ICD-10-CM | POA: Diagnosis not present

## 2017-04-03 DIAGNOSIS — Z794 Long term (current) use of insulin: Secondary | ICD-10-CM

## 2017-04-03 DIAGNOSIS — E1121 Type 2 diabetes mellitus with diabetic nephropathy: Secondary | ICD-10-CM

## 2017-04-03 DIAGNOSIS — E669 Obesity, unspecified: Secondary | ICD-10-CM

## 2017-04-03 DIAGNOSIS — E785 Hyperlipidemia, unspecified: Secondary | ICD-10-CM

## 2017-04-03 DIAGNOSIS — Z23 Encounter for immunization: Secondary | ICD-10-CM

## 2017-04-03 LAB — URINALYSIS, ROUTINE W REFLEX MICROSCOPIC
BILIRUBIN URINE: NEGATIVE
HGB URINE DIPSTICK: NEGATIVE
Ketones, ur: NEGATIVE
LEUKOCYTES UA: NEGATIVE
Nitrite: NEGATIVE
RBC / HPF: NONE SEEN (ref 0–?)
Specific Gravity, Urine: 1.01 (ref 1.000–1.030)
Total Protein, Urine: NEGATIVE
Urine Glucose: NEGATIVE
Urobilinogen, UA: 0.2 (ref 0.0–1.0)
pH: 6.5 (ref 5.0–8.0)

## 2017-04-03 LAB — CBC WITH DIFFERENTIAL/PLATELET
Basophils Absolute: 0.1 10*3/uL (ref 0.0–0.1)
Basophils Relative: 0.9 % (ref 0.0–3.0)
EOS ABS: 0.2 10*3/uL (ref 0.0–0.7)
Eosinophils Relative: 2 % (ref 0.0–5.0)
HCT: 41.5 % (ref 39.0–52.0)
Hemoglobin: 13.7 g/dL (ref 13.0–17.0)
LYMPHS ABS: 2.1 10*3/uL (ref 0.7–4.0)
Lymphocytes Relative: 19.1 % (ref 12.0–46.0)
MCHC: 33 g/dL (ref 30.0–36.0)
MCV: 97 fl (ref 78.0–100.0)
Monocytes Absolute: 0.6 10*3/uL (ref 0.1–1.0)
Monocytes Relative: 5.8 % (ref 3.0–12.0)
NEUTROS PCT: 72.2 % (ref 43.0–77.0)
Neutro Abs: 7.9 10*3/uL — ABNORMAL HIGH (ref 1.4–7.7)
Platelets: 324 10*3/uL (ref 150.0–400.0)
RBC: 4.28 Mil/uL (ref 4.22–5.81)
RDW: 13.7 % (ref 11.5–15.5)
WBC: 10.9 10*3/uL — AB (ref 4.0–10.5)

## 2017-04-03 LAB — LIPID PANEL
Cholesterol: 211 mg/dL — ABNORMAL HIGH (ref 0–200)
HDL: 40.3 mg/dL (ref >39.00)
NonHDL: 170.24
TRIGLYCERIDES: 364 mg/dL — AB (ref 0.0–149.0)
Total CHOL/HDL Ratio: 5
VLDL: 72.8 mg/dL — AB (ref 0.0–40.0)

## 2017-04-03 LAB — BASIC METABOLIC PANEL
BUN: 19 mg/dL (ref 6–23)
CALCIUM: 10.1 mg/dL (ref 8.4–10.5)
CO2: 30 mEq/L (ref 19–32)
Chloride: 99 mEq/L (ref 96–112)
Creatinine, Ser: 1.62 mg/dL — ABNORMAL HIGH (ref 0.40–1.50)
GFR: 54.74 mL/min — AB (ref >60.00)
GLUCOSE: 153 mg/dL — AB (ref 70–99)
POTASSIUM: 4.1 meq/L (ref 3.5–5.1)
SODIUM: 140 meq/L (ref 135–145)

## 2017-04-03 LAB — MICROALBUMIN / CREATININE URINE RATIO
CREATININE, U: 48.2 mg/dL
MICROALB UR: 1.2 mg/dL (ref 0.0–1.9)
MICROALB/CREAT RATIO: 2.5 mg/g (ref 0.0–30.0)

## 2017-04-03 LAB — HEMOGLOBIN A1C: HEMOGLOBIN A1C: 7.3 % — AB (ref 4.6–6.5)

## 2017-04-03 LAB — LDL CHOLESTEROL, DIRECT: LDL DIRECT: 92 mg/dL

## 2017-04-03 NOTE — Patient Instructions (Signed)

## 2017-04-03 NOTE — Progress Notes (Signed)
Subjective:  Patient ID: [name] [name], male    DOB: 07/02/1951  Age: 66 y.o. MRN: [id number]  CC: Hypertension and Diabetes   HPI [name] [name] presents for f/up - He passed a few kidney stones a couple weeks ago but since then has felt well. He has had no recent episodes of flank pain, abdominal pain, dysuria, hematuria, fever, or chills. He tells me his blood pressure and his blood sugar have been well controlled.  Outpatient Medications Prior to Visit  Medication Sig Dispense Refill  . albuterol (PROVENTIL HFA;VENTOLIN HFA) 108 (90 BASE) MCG/ACT inhaler Inhale 2 puffs into the lungs every 6 (six) hours as needed for wheezing. 18 g 5  . amLODipine (NORVASC) 5 MG tablet Take 1 tablet (5 mg total) by mouth daily. 90 tablet 3  . aspirin 81 MG chewable tablet Chew 81 mg by mouth daily.    . fish oil-omega-3 fatty acids 1000 MG capsule Take 2 g by mouth daily.     Marland Kitchen glucose blood (ONETOUCH VERIO) test strip Use as instructed 3x a day   Dx- E11.21 200 each 5  . Glycopyrrolate-Formoterol (BEVESPI AEROSPHERE) 9-4.8 MCG/ACT AERO Inhale 2 Act into the lungs 2 (two) times daily. 10.7 g 11  . hydrochlorothiazide (HYDRODIURIL) 25 MG tablet TAKE 1 TABLET BY MOUTH ONCE DAILY 90 tablet 1  . Insulin Detemir (LEVEMIR FLEXPEN) 100 UNIT/ML Pen Inject 15 units once daily. 5 pen 1  . Insulin Syringe-Needle U-100 (EASY TOUCH INSULIN SYRINGE) 31G X 5/16" 0.3 ML MISC USE AS DIRECTED 4 TIMES DAILY 200 each 4  . linagliptin (TRADJENTA) 5 MG TABS tablet Take 1 tablet (5 mg total) by mouth daily. 30 tablet 11  . losartan (COZAAR) 100 MG tablet TAKE ONE TABLET BY MOUTH ONCE DAILY 90 tablet 1  . metFORMIN (GLUCOPHAGE) 500 MG tablet Take 1 tablet (500 mg total) by mouth 2 (two) times daily with a meal. 90 tablet 0  . metoprolol succinate (TOPROL-XL) 50 MG 24 hr tablet TAKE ONE TABLET BY MOUTH ONCE DAILY WITH OR IMMEDIATELY FOLLOWING A MEAL 90 tablet 1  . NOVOLIN R RELION 100 UNIT/ML injection INJECT THREE TO SIX  UNITS SUBCUTANEOUSLY THREE TIMES DAILY 10 mL 2  . omeprazole (PRILOSEC) 20 MG capsule TAKE ONE CAPSULE BY MOUTH ONCE DAILY 90 capsule 1  . rosuvastatin (CRESTOR) 5 MG tablet Take 1 tablet (5 mg total) by mouth 3 (three) times a week. 90 tablet 1   No facility-administered medications prior to visit.     ROS Review of Systems  Constitutional: Negative.  Negative for diaphoresis and fatigue.  HENT: Negative.   Eyes: Negative.   Respiratory: Negative.  Negative for choking, shortness of breath, wheezing and stridor.   Cardiovascular: Negative for chest pain, palpitations and leg swelling.  Gastrointestinal: Negative for abdominal pain, constipation, diarrhea, nausea and vomiting.  Genitourinary: Negative.  Negative for difficulty urinating, dysuria, flank pain, frequency, hematuria, penile swelling, scrotal swelling and urgency.  Musculoskeletal: Negative.  Negative for back pain and myalgias.  Skin: Negative.  Negative for color change and rash.  Allergic/Immunologic: Negative.   Neurological: Negative.  Negative for dizziness, weakness and light-headedness.  Hematological: Negative.  Negative for adenopathy. Does not bruise/bleed easily.  Psychiatric/Behavioral: Negative.     Objective:  BP (!) 144/80 (BP Location: Left Arm, Patient Position: Sitting, Cuff Size: Normal)   Pulse 74   Temp 98.5 F (36.9 C) (Oral)   Resp 16   Ht 5\' 9"  (1.753 m)   Wt 205  lb 8 oz (93.2 kg)   SpO2 98%   BMI 30.35 kg/m   BP Readings from Last 3 Encounters:  04/03/17 (!) 144/80  03/22/17 125/69  02/28/17 (!) 152/78    Wt Readings from Last 3 Encounters:  04/03/17 205 lb 8 oz (93.2 kg)  03/22/17 201 lb (91.2 kg)  02/28/17 204 lb (92.5 kg)    Physical Exam  Constitutional: He is oriented to person, place, and time. No distress.  HENT:  Mouth/Throat: Oropharynx is clear and moist. No oropharyngeal exudate.  Eyes: Conjunctivae are normal. Right eye exhibits no discharge. Left eye exhibits no  discharge. No scleral icterus.  Neck: Normal range of motion. Neck supple. No JVD present. No thyromegaly present.  Cardiovascular: Normal rate, regular rhythm and intact distal pulses.  Exam reveals no gallop and no friction rub.   No murmur heard. Pulmonary/Chest: Effort normal and breath sounds normal. No respiratory distress. He has no wheezes. He has no rales. He exhibits no tenderness.  Abdominal: Soft. Bowel sounds are normal. He exhibits no distension and no mass. There is no tenderness. There is no rebound and no guarding.  Musculoskeletal: Normal range of motion. He exhibits no edema, tenderness or deformity.  Lymphadenopathy:    He has no cervical adenopathy.  Neurological: He is alert and oriented to person, place, and time.  Skin: Skin is warm and dry. No rash noted. He is not diaphoretic. No erythema. No pallor.  Vitals reviewed.   Lab Results  Component Value Date   WBC 10.9 (H) 04/03/2017   HGB 13.7 04/03/2017   HCT 41.5 04/03/2017   PLT 324.0 04/03/2017   GLUCOSE 153 (H) 04/03/2017   CHOL 211 (H) 04/03/2017   TRIG 364.0 (H) 04/03/2017   HDL 40.30 04/03/2017   LDLDIRECT 92.0 04/03/2017   LDLCALC 124 (H) 12/29/2014   ALT 10 09/04/2015   AST 15 09/04/2015   NA 140 04/03/2017   K 4.1 04/03/2017   CL 99 04/03/2017   CREATININE 1.62 (H) 04/03/2017   BUN 19 04/03/2017   CO2 30 04/03/2017   TSH 2.42 09/16/2016   PSA 1.32 09/16/2016   INR 0.91 10/19/2012   HGBA1C 7.3 (H) 04/03/2017   MICROALBUR 1.2 04/03/2017    No results found.  Assessment & Plan:   [name] [name] was seen today for hypertension and diabetes.  Diagnoses and all orders for this visit:  Essential hypertension, benign- his blood pressure is adequately well-controlled. His renal function is stable and his electrolytes are normal. -     CBC with Differential/Platelet; Future -     Basic metabolic panel; Future -     Urinalysis, Routine w reflex microscopic; Future  Type 2 diabetes mellitus with  diabetic nephropathy, with long-term current use of insulin ([place])- his A1c is up to 7.3%. His blood sugars are not quite adequately well-controlled but he does not want to add another agent. He will continue to work on his lifestyle modifications. -     Basic metabolic panel; Future -     Hemoglobin A1c; Future -     Microalbumin / creatinine urine ratio; Future -     Ambulatory referral to Ophthalmology  Obesity (BMI 30.0-34.9)- he agrees to work on his lifestyle modifications to lose weight.  Hyperlipidemia with target LDL less than 100- he has achieved his LDL goal is doing well on the statin. -     Lipid panel; Future  Need for influenza vaccination -     Flu vaccine HIGH DOSE  PF (Fluzone High dose)   I am having Mr. Whitefield maintain his fish oil-omega-3 fatty acids, aspirin, amLODipine, albuterol, metoprolol succinate, Glycopyrrolate-Formoterol, Insulin Syringe-Needle U-100, Insulin Detemir, linagliptin, rosuvastatin, glucose blood, metFORMIN, omeprazole, NOVOLIN R RELION, hydrochlorothiazide, and losartan.  No orders of the defined types were placed in this encounter.    Follow-up: Return in about 4 months (around 08/03/2017).  Scarlette Calico, MD

## 2017-04-05 ENCOUNTER — Encounter: Payer: Self-pay | Admitting: Internal Medicine

## 2017-04-16 ENCOUNTER — Encounter: Payer: Self-pay | Admitting: Internal Medicine

## 2017-04-16 ENCOUNTER — Ambulatory Visit (INDEPENDENT_AMBULATORY_CARE_PROVIDER_SITE_OTHER): Payer: Medicare Other | Admitting: Internal Medicine

## 2017-04-16 VITALS — BP 132/82 | HR 52 | Wt 204.0 lb

## 2017-04-16 DIAGNOSIS — E1121 Type 2 diabetes mellitus with diabetic nephropathy: Secondary | ICD-10-CM

## 2017-04-16 DIAGNOSIS — Z794 Long term (current) use of insulin: Secondary | ICD-10-CM | POA: Diagnosis not present

## 2017-04-16 MED ORDER — INSULIN REGULAR HUMAN 100 UNIT/ML IJ SOLN: INTRAMUSCULAR | 3 refills | Status: DC

## 2017-04-16 NOTE — Progress Notes (Signed)
Patient ID: [name] [name], male   DOB: 12-26-1950, 66 y.o.   MRN: [id number]  HPI: [name] [name] is a 66 y.o.-year-old male, returning for f/u for DM2, dx in 06/2012, insulin-dependent since 09/2012, uncontrolled, with complications (CKD stage 3, mild DR). Last visit 7 mo ago (he is not compliant with appointments).  His best friend died in 2017-02-14.  Last hemoglobin A1c was: Lab Results  Component Value Date   HGBA1C 7.3 (H) 04/03/2017   HGBA1C 6.6 09/11/2016   HGBA1C 6.8 02/16/2016   Pt is on a regimen of: - Metformin 500 mg 2x a day with meals   - Tradjenta 5 mg in am - Novolin SSI - target 140, adding 2 units for approx 30 mg/dL - takes this now consistently - Levemir 10 units at bedtime He had to stop Tradjenta 5 mg in the past as his [employer] did not cover it. Pays 47$ for 1 mo (tier 3). He heard about Victoza, but it is not very eager to try newer medicines.  He checks sugars ~1x a day and they are - No log, meter: - am:111-150, 180 (2-5 units Nov) >> 109-110s >> 78-148 - 1h after b'fast: <150 >> max 120 >> n/c - before lunch: 80-130 (juices for lunch) >> 110-140 >> 120-140 >> 120-140 - 2h after lunch: 130-140 >> 98-110 >> n/c - before dinner: 120s >> 95-140 >> 120-140 >> 140-197 - 2h after dinner: 120-140 >> <140 >> n/c >>  - bedtime:120-130 >> 130-140 >> 150s > 121, 140-142 No lows. Lowest sugar was 95 >> 71 >> 78; does not know if has hypoglycemia awareness.  Highest sugar was 180 >> 190 >> 190s.  He has mild chronic kidney disease, last BUN/creatinine was:  Lab Results  Component Value Date   BUN 19 04/03/2017   CREATININE 1.62 (H) 04/03/2017   Last ACR: Lab Results  Component Value Date   MICRALBCREAT 2.5 04/03/2017   MICRALBCREAT 0.8 03/11/2016   MICRALBCREAT 1.9 12/29/2014   MICRALBCREAT 0.6 02/11/2013   Last set of lipids: Lab Results  Component Value Date   CHOL 211 (H) 04/03/2017   HDL 40.30 04/03/2017   LDLCALC 124 (H) 12/29/2014   LDLDIRECT  92.0 04/03/2017   TRIG 364.0 (H) 04/03/2017   CHOLHDL 5 04/03/2017  On Crestor 5 every other day  Pt's last eye exam was In 04/2015. 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 He sees Dr. Zigmund Daniel.  He also has a history of nonischemic cardiomyopathy, with 2-D echo showing an EF of 43-32%, grade 1 diastolic dysfunction, severe LV H, normal LV size and mildly dilated LA. He also has hyperlipidemia, history of atrial fibrillation, COPD and GERD. No h/o pancreatitis.   He is going to [place] in 04/2017.  Son is an ophthalmologist.  ROS: Constitutional: no weight gain/no weight loss, no fatigue, no subjective hyperthermia, no subjective hypothermia Eyes: no blurry vision, no xerophthalmia ENT: no sore throat, no nodules palpated in throat, no dysphagia, no odynophagia, no hoarseness Cardiovascular: no CP/no SOB/no palpitations/no leg swelling Respiratory: no cough/no SOB/no wheezing Gastrointestinal: no N/no V/no D/no C/no acid reflux Musculoskeletal: no muscle aches/no joint aches Skin: no rashes, no hair loss Neurological: no tremors/no numbness/no tingling/no dizziness  I reviewed pt's medications, allergies, PMH, social hx, family hx, and changes were documented in the history of present illness. Otherwise, unchanged from my initial visit note.  PE: BP 132/82 (BP Location: Left Arm, Patient Position: Sitting)   Pulse (!) 52  Wt 204 lb (92.5 kg)   BMI 30.13 kg/m  Body mass index is 30.13 kg/m. Wt Readings from Last 3 Encounters:  04/16/17 204 lb (92.5 kg)  04/03/17 205 lb 8 oz (93.2 kg)  03/22/17 201 lb (91.2 kg)   Constitutional: overweight, in NAD Eyes: PERRLA, EOMI, no exophthalmos ENT: moist mucous membranes, no thyromegaly, no cervical lymphadenopathy Cardiovascular: Bradycardia, RR, No MRG Respiratory: CTA B Gastrointestinal: abdomen soft, NT, ND, BS+ Musculoskeletal: no deformities, strength intact in all 4 Skin: moist, warm, no rashes Neurological:  no tremor with outstretched hands, DTR normal in all 4  ASSESSMENT: 1. DM2, insulin-dependent, uncontrolled, with complications - CKD stage III - mild DR  2. HL  He sees Dr Stanford Breed for Afib and cardiomyopathy (LV Htf)  PLAN:  1. Patient with improved diabetes control after addition of Tradjenta, however, with worse sugars now, returning again after a longer absence. Since last visit, his roommate died >> he was more stressd >> ate a lot of icecream >> sugars worse, TG worse and kidney fxn also worse. He is determined to change his diet. - he is taking SSI of Novolin R and refuses Victoza or even Glipizide >> will ask him to take R insulin consistently before meals rather than postprandially - I advised him to: Patient Instructions  Please continue: - Metformin 500 mg 2x a day with meals   - Tradjenta 5 mg in am - Levemir 10 units at bedtime  Please start Novolin R before meals (30 min before) and take:  5 units before a smaller meal  8 units before a larger meal  Please return in 6 months with your sugar log.    - Reviewed together his latest HbA1c, which was higher, at 7.3% earlier this month - continue checking sugars at different times of the day - check 1x a day, rotating checks - advised for yearly eye exams >> he is still due - had the flu shot already - Return to clinic in 6 mo with sugar log- he is not usually coming more often   2. HL - at last visit with PCP, TG were worse - improving DM control and eliminating icecream will work  Philemon Kingdom, MD PhD [facility] Endocrinology

## 2017-04-16 NOTE — Patient Instructions (Addendum)
Please continue: - Metformin 500 mg 2x a day with meals   - Tradjenta 5 mg in am - Levemir 10 units at bedtime  Please start Novolin R before meals (30 min before) and take:  5 units before a smaller meal  8 units before a larger meal  Please return in 6 months with your sugar log.

## 2017-04-28 ENCOUNTER — Other Ambulatory Visit: Payer: Self-pay

## 2017-04-28 ENCOUNTER — Telehealth: Payer: Self-pay | Admitting: Cardiology

## 2017-04-28 MED ORDER — AMLODIPINE BESYLATE 5 MG PO TABS: 5.0000 mg | ORAL_TABLET | Freq: Every day | ORAL | 0 refills | Status: DC

## 2017-04-28 NOTE — Telephone Encounter (Signed)
New message     *STAT* If patient is at the pharmacy, call can be transferred to refill team.   1. Which medications need to be refilled? (please list name of each medication and dose if known) amlodipine 5 mg  2. Which pharmacy/location (including street and city if local pharmacy) is medication to be sent to? [facility] on [address]  3. Do they need a 30 day or 90 day supply? 30 day

## 2017-05-27 NOTE — Progress Notes (Deleted)
Cardiology Office Note    Date:  05/27/2017   ID:  [name] [name], DOB 02-01-1951, MRN [id number]  PCP:  Janith Lima, MD  Cardiologist: Dr. Stanford Breed   No chief complaint on file.   History of Present Illness:    [name] [name] is a 66 y.o. male with past medical history of PAF (occuring in 2014 in the setting of encephalopathy, has declined anticoagulation in the past), LVH, HTN, HLD, and Type 2 DM who presents to the office today for 2.5 year follow-up.   He was last examined by Dr. Stanford Breed in 11/2014 and reported doing well from a cardiac perspective at that time. BP was elevated at the time of his visit, therefore Amlodipine 5mg  daily was added to his medication regimen. With his recrnt echocardiogram showing asymmetric LVH, a cardiac MRI was ordered to screen for hypertrophic cardiomyopathy but this was never performed.     Past Medical History:  Diagnosis Date  . Allergy   . Asthma   . Atrial fibrillation ([place])   . Cardiomyopathy 2011   Patient was told that he had an ejection fraction of 37% by echo done elsewhere in 2011  . COPD (chronic obstructive pulmonary disease) ([place])   . Diabetes mellitus without complication ([place])   . Diverticulitis   . GERD (gastroesophageal reflux disease)   . History of colon polyps   . Hypertension   . Tobacco abuse     Past Surgical History:  Procedure Laterality Date  . COLON SURGERY  2009  . COLONOSCOPY      Current Medications: Outpatient Medications Prior to Visit  Medication Sig Dispense Refill  . albuterol (PROVENTIL HFA;VENTOLIN HFA) 108 (90 BASE) MCG/ACT inhaler Inhale 2 puffs into the lungs every 6 (six) hours as needed for wheezing. 18 g 5  . amLODipine (NORVASC) 5 MG tablet Take 1 tablet (5 mg total) by mouth daily. 30 tablet 0  . aspirin 81 MG chewable tablet Chew 81 mg by mouth daily.    . fish oil-omega-3 fatty acids 1000 MG capsule Take 2 g by mouth daily.     Marland Kitchen glucose blood (ONETOUCH VERIO) test strip Use  as instructed 3x a day   Dx- E11.21 200 each 5  . Glycopyrrolate-Formoterol (BEVESPI AEROSPHERE) 9-4.8 MCG/ACT AERO Inhale 2 Act into the lungs 2 (two) times daily. 10.7 g 11  . hydrochlorothiazide (HYDRODIURIL) 25 MG tablet TAKE 1 TABLET BY MOUTH ONCE DAILY 90 tablet 1  . Insulin Detemir (LEVEMIR FLEXPEN) 100 UNIT/ML Pen Inject 15 units once daily. 5 pen 1  . insulin regular (NOVOLIN R RELION) 100 units/mL injection INJECT 5-8 UNITS SUBCUTANEOUSLY THREE TIMES DAILY before meals 10 mL 3  . Insulin Syringe-Needle U-100 (EASY TOUCH INSULIN SYRINGE) 31G X 5/16" 0.3 ML MISC USE AS DIRECTED 4 TIMES DAILY 200 each 4  . linagliptin (TRADJENTA) 5 MG TABS tablet Take 1 tablet (5 mg total) by mouth daily. 30 tablet 11  . losartan (COZAAR) 100 MG tablet TAKE ONE TABLET BY MOUTH ONCE DAILY 90 tablet 1  . metFORMIN (GLUCOPHAGE) 500 MG tablet Take 1 tablet (500 mg total) by mouth 2 (two) times daily with a meal. 90 tablet 0  . metoprolol succinate (TOPROL-XL) 50 MG 24 hr tablet TAKE ONE TABLET BY MOUTH ONCE DAILY WITH OR IMMEDIATELY FOLLOWING A MEAL 90 tablet 1  . omeprazole (PRILOSEC) 20 MG capsule TAKE ONE CAPSULE BY MOUTH ONCE DAILY 90 capsule 1  . rosuvastatin (CRESTOR) 5 MG tablet Take 1 tablet (5  mg total) by mouth 3 (three) times a week. 90 tablet 1   No facility-administered medications prior to visit.      Allergies:   Hydralazine; Lisinopril; Penicillins; and Tribenzor [olmesartan-amlodipine-hctz]   Social History   Social History  . Marital status: Divorced    Spouse name: N/A  . Number of children: N/A  . Years of education: N/A   Social History Main Topics  . Smoking status: Former Smoker    Types: Cigarettes    Quit date: 07/30/2011  . Smokeless tobacco: Never Used     Comment: Smoked since age 25  . Alcohol use 0.5 oz/week    1 Standard drinks or equivalent per week  . Drug use: No  . Sexual activity: Not Currently   Other Topics Concern  . Not on file   Social History  Narrative  . No narrative on file     Family History:  The patient's ***family history includes Arthritis in his other; Atrial fibrillation in his mother; Cancer in his other; Colon cancer in his father; Diabetes in his other; Diabetes type II in his mother; Hypertension in his mother and other; Prostate cancer in his brother and father.   Review of Systems:   Please see the history of present illness.     General:  No chills, fever, night sweats or weight changes.  Cardiovascular:  No chest pain, dyspnea on exertion, edema, orthopnea, palpitations, paroxysmal nocturnal dyspnea. Dermatological: No rash, lesions/masses Respiratory: No cough, dyspnea Urologic: No hematuria, dysuria Abdominal:   No nausea, vomiting, diarrhea, bright red blood per rectum, melena, or hematemesis Neurologic:  No visual changes, wkns, changes in mental status. All other systems reviewed and are otherwise negative except as noted above.   Physical Exam:    VS:  There were no vitals taken for this visit.   General: Well developed, well nourished,male appearing in no acute distress. Head: Normocephalic, atraumatic, sclera non-icteric, no xanthomas, nares are without discharge.  Neck: No carotid bruits. JVD not elevated.  Lungs: Respirations regular and unlabored, without wheezes or rales.  Heart: ***Regular rate and rhythm. No S3 or S4.  No murmur, no rubs, or gallops appreciated. Abdomen: Soft, non-tender, non-distended with normoactive bowel sounds. No hepatomegaly. No rebound/guarding. No obvious abdominal masses. Msk:  Strength and tone appear normal for age. No joint deformities or effusions. Extremities: No clubbing or cyanosis. No edema.  Distal pedal pulses are 2+ bilaterally. Neuro: Alert and oriented X 3. Moves all extremities spontaneously. No focal deficits noted. Psych:  Responds to questions appropriately with a normal affect. Skin: No rashes or lesions noted  Wt Readings from Last 3 Encounters:   04/16/17 204 lb (92.5 kg)  04/03/17 205 lb 8 oz (93.2 kg)  03/22/17 201 lb (91.2 kg)        Studies/Labs Reviewed:   EKG:  EKG is*** ordered today.  The ekg ordered today demonstrates ***  Recent Labs: 09/16/2016: TSH 2.42 04/03/2017: BUN 19; Creatinine, Ser 1.62; Hemoglobin 13.7; Platelets 324.0; Potassium 4.1; Sodium 140   Lipid Panel    Component Value Date/Time   CHOL 211 (H) 04/03/2017 0841   TRIG 364.0 (H) 04/03/2017 0841   HDL 40.30 04/03/2017 0841   CHOLHDL 5 04/03/2017 0841   VLDL 72.8 (H) 04/03/2017 0841   LDLCALC 124 (H) 12/29/2014 0939   LDLDIRECT 92.0 04/03/2017 0841    Additional studies/ records that were reviewed today include:   Echocardiogram: 06/2014 Study Conclusions  - Left ventricle: Severe asymetric septal hypertrophy  with no SAM or LVOT gradient Consider MRI to further risk stratify for HOCM if clinically indicated. The cavity size was normal. Wall thickness was increased in a pattern of severe LVH. Systolic function was normal. The estimated ejection fraction was in the range of 55% to 60%. - Mitral valve: There was mild regurgitation. - Left atrium: The atrium was mildly dilated. - Atrial septum: No defect or patent foramen ovale was identified.  Assessment:    No diagnosis found.   Plan:   In order of problems listed above:  1. ***    Medication Adjustments/Labs and Tests Ordered: Current medicines are reviewed at length with the patient today.  Concerns regarding medicines are outlined above.  Medication changes, Labs and Tests ordered today are listed in the Patient Instructions below. There are no Patient Instructions on file for this visit.   Signed, Erma Heritage, PA-C  05/27/2017 3:26 PM    [facility] [place], [place], [place]  [address] Phone: [phone]; Fax: [phone]  [address] [place],  [address] Phone: [phone]

## 2017-05-28 ENCOUNTER — Ambulatory Visit: Payer: Medicare Other | Admitting: Student

## 2017-05-28 ENCOUNTER — Other Ambulatory Visit: Payer: Self-pay | Admitting: Cardiology

## 2017-05-28 NOTE — Telephone Encounter (Signed)
REFILL 

## 2017-06-05 NOTE — Progress Notes (Signed)
Cardiology Office Note    Date:  06/06/2017   ID:  [name] [name], DOB 11-Apr-1951, MRN [id number]  PCP:  Janith Lima, MD  Cardiologist: Dr. Stanford Breed   Chief Complaint  Patient presents with  . Follow-up    2.5 year folllow-up appointment    History of Present Illness:    [name] [name] is a 66 y.o. male with past medical history of PAF (occuring in 2014 in the setting of encephalopathy with recurrence since, has declined anticoagulation in the past), LVH, HTN, HLD, and Type 2 DM who presents to the office today for 2.5 year follow-up.   He was last examined by Dr. Stanford Breed in 11/2014 and reported doing well from a cardiac perspective at that time. BP was elevated at the time of his visit, therefore Amlodipine 5mg  daily was added to his medication regimen. With his recent echocardiogram showing asymmetric LVH, a cardiac MRI was ordered to screen for hypertrophic cardiomyopathy but this was never performed.   In talking with the patient today, he reports doing well since his last office visit. He denies any recent chest pain, dyspnea on exertion, orthopnea, PND or lower extremity edema. He does experience occasional palpitations but denies any associated dizziness, lightheadedness, or presyncope. 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   He reports good compliance with his medication regimen. BP is well-controlled at 128/80 during today's visit and he reports similar results when checked at home.    Past Medical History:  Diagnosis Date  . Allergy   . Asthma   . Atrial fibrillation ([place])    a. initially occuring in 2014, has declined anticoagulation since having a GI bleed with initiation of Xarelto  . Cardiomyopathy 2011   a. Patient was told that he had an ejection fraction of 37% by echo done elsewhere in 2011 b. 06/2014: echo showing preserved EF of 55-60%, severe asymetric  septal hypertrophy with no SAM or LVOT gradient  . COPD (chronic obstructive pulmonary disease) ([place])   . Diabetes mellitus without complication ([place])   . Diverticulitis   . GERD (gastroesophageal reflux disease)   . History of colon polyps   . Hypertension   . Tobacco abuse     Past Surgical History:  Procedure Laterality Date  . COLON SURGERY  2009  . COLONOSCOPY      Current Medications: Outpatient Medications Prior to Visit  Medication Sig Dispense Refill  . albuterol (PROVENTIL HFA;VENTOLIN HFA) 108 (90 BASE) MCG/ACT inhaler Inhale 2 puffs into the lungs every 6 (six) hours as needed for wheezing. 18 g 5  . aspirin 81 MG chewable tablet Chew 81 mg by mouth daily.    . fish oil-omega-3 fatty acids 1000 MG capsule Take 2 g by mouth daily.     Marland Kitchen glucose blood (ONETOUCH VERIO) test strip Use as instructed 3x a day   Dx- E11.21 200 each 5  . Glycopyrrolate-Formoterol (BEVESPI AEROSPHERE) 9-4.8 MCG/ACT AERO Inhale 2 Act into the lungs 2 (two) times daily. 10.7 g 11  . hydrochlorothiazide (HYDRODIURIL) 25 MG tablet TAKE 1 TABLET BY MOUTH ONCE DAILY 90 tablet 1  . Insulin Detemir (LEVEMIR FLEXPEN) 100 UNIT/ML Pen Inject 15 units once daily. 5 pen 1  . insulin regular (NOVOLIN R RELION) 100 units/mL injection INJECT 5-8 UNITS SUBCUTANEOUSLY THREE TIMES DAILY before meals 10 mL 3  . Insulin Syringe-Needle U-100 (EASY TOUCH INSULIN SYRINGE) 31G  X 5/16" 0.3 ML MISC USE AS DIRECTED 4 TIMES DAILY 200 each 4  . linagliptin (TRADJENTA) 5 MG TABS tablet Take 1 tablet (5 mg total) by mouth daily. 30 tablet 11  . metFORMIN (GLUCOPHAGE) 500 MG tablet Take 1 tablet (500 mg total) by mouth 2 (two) times daily with a meal. 90 tablet 0  . omeprazole (PRILOSEC) 20 MG capsule TAKE ONE CAPSULE BY MOUTH ONCE DAILY 90 capsule 1  . rosuvastatin (CRESTOR) 5 MG tablet Take 1 tablet (5 mg total) by mouth 3 (three) times a week. 90 tablet 1  . amLODipine (NORVASC) 5 MG tablet Take 1 tablet (5 mg total) by mouth  daily. KEEP OV. 90 tablet 1  . losartan (COZAAR) 100 MG tablet TAKE ONE TABLET BY MOUTH ONCE DAILY 90 tablet 1  . metoprolol succinate (TOPROL-XL) 50 MG 24 hr tablet TAKE ONE TABLET BY MOUTH ONCE DAILY WITH OR IMMEDIATELY FOLLOWING A MEAL 90 tablet 1   No facility-administered medications prior to visit.      Allergies:   Hydralazine; Lisinopril; Penicillins; and Tribenzor [olmesartan-amlodipine-hctz]   Social History   Socioeconomic History  . Marital status: Divorced    Spouse name: None  . Number of children: None  . Years of education: None  . Highest education level: None  Social Needs  . Financial resource strain: None  . Food insecurity - worry: None  . Food insecurity - inability: None  . Transportation needs - medical: None  . Transportation needs - non-medical: None  Occupational History  . None  Tobacco Use  . Smoking status: Former Smoker    Types: Cigarettes    Last attempt to quit: 07/30/2011    Years since quitting: 5.8  . Smokeless tobacco: Never Used  . Tobacco comment: Smoked since age 55  Substance and Sexual Activity  . Alcohol use: Yes    Alcohol/week: 0.5 oz    Types: 1 Standard drinks or equivalent per week  . Drug use: No  . Sexual activity: Not Currently  Other Topics Concern  . None  Social History Narrative  . None     Family History:  The patient's family history includes Arthritis in his other; Atrial fibrillation in his mother; Cancer in his other; Colon cancer in his father; Diabetes in his other; Diabetes type II in his mother; Hypertension in his mother and other; Prostate cancer in his brother and father.   Review of Systems:   Please see the history of present illness.     General:  No chills, fever, night sweats or weight changes.  Cardiovascular:  No chest pain, dyspnea on exertion, edema, orthopnea, paroxysmal nocturnal dyspnea. Positive for palpitations (occasionally).  Dermatological: No rash, lesions/masses Respiratory: No  cough, dyspnea Urologic: No hematuria, dysuria Abdominal:   No nausea, vomiting, diarrhea, bright red blood per rectum, melena, or hematemesis Neurologic:  No visual changes, wkns, changes in mental status. All other systems reviewed and are otherwise negative except as noted above.   Physical Exam:    VS:  BP 128/80   Pulse (!) 54   Ht 5\' 10"  (1.778 m)   Wt 204 lb 9.6 oz (92.8 kg)   BMI 29.36 kg/m    General: Well developed, well nourished [place] American male appearing in no acute distress. Head: Normocephalic, atraumatic, sclera non-icteric, no xanthomas, nares are without discharge.  Neck: No carotid bruits. JVD not elevated.  Lungs: Respirations regular and unlabored, without wheezes or rales.  Heart: Irregularly irregular. No S3 or  S4.  No murmur, no rubs, or gallops appreciated. Abdomen: Soft, non-tender, non-distended with normoactive bowel sounds. No hepatomegaly. No rebound/guarding. No obvious abdominal masses. Msk:  Strength and tone appear normal for age. No joint deformities or effusions. Extremities: No clubbing or cyanosis. No lower extremity edema.  Distal pedal pulses are 2+ bilaterally. Neuro: Alert and oriented X 3. Moves all extremities spontaneously. No focal deficits noted. Psych:  Responds to questions appropriately with a normal affect. Skin: No rashes or lesions noted  Wt Readings from Last 3 Encounters:  06/06/17 204 lb 9.6 oz (92.8 kg)  04/16/17 204 lb (92.5 kg)  04/03/17 205 lb 8 oz (93.2 kg)     Studies/Labs Reviewed:   EKG:  EKG is ordered today. The ekg ordered today demonstrates atrial fibrillation, HR 54. No acute ST or T-wave changes when compared to prior tracings.   Recent Labs: 09/16/2016: TSH 2.42 04/03/2017: BUN 19; Creatinine, Ser 1.62; Hemoglobin 13.7; Platelets 324.0; Potassium 4.1; Sodium 140   Lipid Panel    Component Value Date/Time   CHOL 211 (H) 04/03/2017 0841   TRIG 364.0 (H) 04/03/2017 0841   HDL 40.30 04/03/2017 0841    CHOLHDL 5 04/03/2017 0841   VLDL 72.8 (H) 04/03/2017 0841   LDLCALC 124 (H) 12/29/2014 0939   LDLDIRECT 92.0 04/03/2017 0841    Additional studies/ records that were reviewed today include:   Echocardiogram: 06/2014 Study Conclusions  - Left ventricle: Severe asymetric septal hypertrophy with no SAM or LVOT gradient Consider MRI to further risk stratify for HOCM if clinically indicated. The cavity size was normal. Wall thickness was increased in a pattern of severe LVH. Systolic function was normal. The estimated ejection fraction was in the range of 55% to 60%. - Mitral valve: There was mild regurgitation. - Left atrium: The atrium was mildly dilated. - Atrial septum: No defect or patent foramen ovale was identified.  Assessment:    1. Paroxysmal atrial fibrillation (HCC)   2. Essential hypertension, benign   3. Hyperlipidemia with target LDL less than 100   4. Hypertrophic nonobstructive cardiomyopathy ([place])   5. IDDM (insulin dependent diabetes mellitus) ([place])      Plan:   In order of problems listed above:  1. Paroxysmal Atrial Fibrillation - the patient has a known history of atrial fibrillation, having initially occurred in 2014. He reports occasional palpitations but denies any associated dizziness, lightheadedness, or presyncope. No recent chest pain or dyspnea on exertion.  - he is in atrial fibrillation today with HR well-controlled in the 50's. Continue Toprol-XL 50mg  daily.  - This patients CHA2DS2-VASc Score and unadjusted Ischemic Stroke Rate (% per year) is equal to 3.2 % stroke rate/year from a score of 3 (HTN, DM, Age). He experienced a GIB with Xarelto in 2014 and refuses to be on anticoagulation at this time. We reviewed the association between a CVA and PAF. Will continue on ASA 81mg  daily but he is aware this does not provide the same benefit as full-dose anticoagulation.   2. HTN - BP is well-controlled at 128/80 during today's visit.  -  continue Amlodipine 5mg  daily, HCTZ 25mg  daily, Losartan 100mg  daily, and Toprol-XL 50mg  daily.   3. HLD - Lipid Panel in 03/2017 showed total cholesterol of 211, HDL 40, and LDL 92.  - continue Crestor 5mg  three times weekly. Unable to tolerate higher dosing secondary to myalgias.   4. Cardiomyopathy - echocardiogram in 2015 showed severe asymmetric septal hypertrophy with no SAM or LVOT gradient. A Cardiac  MRI was ordered to further risk stratify for HOCM but never obtained.  - the patient was unable to undergo a Cardiac MRI due to claustrophobia. Was given an Rx for Valium but still unable to tolerate the procedure. - continue current medication regimen as outlined above for BP remains well-controlled.   5. IDDM - followed by PCP.    Medication Adjustments/Labs and Tests Ordered: Current medicines are reviewed at length with the patient today.  Concerns regarding medicines are outlined above.  Medication changes, Labs and Tests ordered today are listed in the Patient Instructions below. Patient Instructions  Your physician wants you to follow-up in: [place] will receive a reminder letter in the mail two months in advance. If you don't receive a letter, please call our office to schedule the follow-up appointment.   If you need a refill on your cardiac medications before your next appointment, please call your pharmacy.     Signed, Erma Heritage, PA-C  06/06/2017 11:45 AM    [facility] [address] [place], [place], [place]  [address] Phone: [phone]; Fax: [phone]  [address], [place], [place] [address] Phone: [phone]

## 2017-06-06 ENCOUNTER — Ambulatory Visit (INDEPENDENT_AMBULATORY_CARE_PROVIDER_SITE_OTHER): Payer: Medicare Other | Admitting: Student

## 2017-06-06 ENCOUNTER — Encounter: Payer: Self-pay | Admitting: Student

## 2017-06-06 VITALS — BP 128/80 | HR 54 | Ht 70.0 in | Wt 204.6 lb

## 2017-06-06 DIAGNOSIS — E785 Hyperlipidemia, unspecified: Secondary | ICD-10-CM

## 2017-06-06 DIAGNOSIS — I10 Essential (primary) hypertension: Secondary | ICD-10-CM

## 2017-06-06 DIAGNOSIS — E119 Type 2 diabetes mellitus without complications: Secondary | ICD-10-CM

## 2017-06-06 DIAGNOSIS — I422 Other hypertrophic cardiomyopathy: Secondary | ICD-10-CM

## 2017-06-06 DIAGNOSIS — I480 Paroxysmal atrial fibrillation: Secondary | ICD-10-CM | POA: Diagnosis not present

## 2017-06-06 DIAGNOSIS — IMO0001 Reserved for inherently not codable concepts without codable children: Secondary | ICD-10-CM

## 2017-06-06 DIAGNOSIS — Z794 Long term (current) use of insulin: Secondary | ICD-10-CM | POA: Diagnosis not present

## 2017-06-06 MED ORDER — METOPROLOL SUCCINATE ER 50 MG PO TB24: ORAL_TABLET | ORAL | 3 refills | Status: DC

## 2017-06-06 MED ORDER — AMLODIPINE BESYLATE 5 MG PO TABS: 5.0000 mg | ORAL_TABLET | Freq: Every day | ORAL | 3 refills | Status: DC

## 2017-06-06 MED ORDER — LOSARTAN POTASSIUM 100 MG PO TABS: 100.0000 mg | ORAL_TABLET | Freq: Every day | ORAL | 3 refills | Status: DC

## 2017-06-06 NOTE — Patient Instructions (Signed)
Your physician wants you to follow-up in: ONE YEAR WITH DR CRENSHAW You will receive a reminder letter in the mail two months in advance. If you don't receive a letter, please call our office to schedule the follow-up appointment.   If you need a refill on your cardiac medications before your next appointment, please call your pharmacy.  

## 2017-06-12 ENCOUNTER — Other Ambulatory Visit: Payer: Self-pay | Admitting: Internal Medicine

## 2017-06-12 ENCOUNTER — Telehealth: Payer: Self-pay | Admitting: Internal Medicine

## 2017-06-12 DIAGNOSIS — J410 Simple chronic bronchitis: Secondary | ICD-10-CM

## 2017-06-12 MED ORDER — GLYCOPYRROLATE-FORMOTEROL 9-4.8 MCG/ACT IN AERO: 2.0000 | INHALATION_SPRAY | Freq: Two times a day (BID) | RESPIRATORY_TRACT | 11 refills | Status: DC

## 2017-06-12 NOTE — Telephone Encounter (Signed)
RX sent

## 2017-06-12 NOTE — Telephone Encounter (Signed)
Pt called and was given samples of Vespi awhile ago for COPD and it has been affective, he would like a RX for this  Please call into [facility] Please advise

## 2017-06-12 NOTE — Telephone Encounter (Signed)
rx for [facility] requested. Please advise

## 2017-08-07 ENCOUNTER — Other Ambulatory Visit: Payer: Self-pay | Admitting: Internal Medicine

## 2017-09-23 ENCOUNTER — Other Ambulatory Visit: Payer: Self-pay | Admitting: Internal Medicine

## 2017-09-23 DIAGNOSIS — J42 Unspecified chronic bronchitis: Secondary | ICD-10-CM

## 2017-09-23 MED ORDER — ALBUTEROL SULFATE HFA 108 (90 BASE) MCG/ACT IN AERS: 2.0000 | INHALATION_SPRAY | Freq: Four times a day (QID) | RESPIRATORY_TRACT | 5 refills | Status: AC | PRN

## 2017-09-27 ENCOUNTER — Other Ambulatory Visit: Payer: Self-pay | Admitting: Internal Medicine

## 2017-10-14 ENCOUNTER — Ambulatory Visit: Payer: Medicare Other | Admitting: Internal Medicine

## 2017-10-22 ENCOUNTER — Other Ambulatory Visit: Payer: Self-pay | Admitting: Internal Medicine

## 2017-10-23 ENCOUNTER — Ambulatory Visit: Payer: Medicare Other | Admitting: Internal Medicine

## 2017-11-17 ENCOUNTER — Ambulatory Visit (INDEPENDENT_AMBULATORY_CARE_PROVIDER_SITE_OTHER): Payer: Medicare Other | Admitting: Internal Medicine

## 2017-11-17 ENCOUNTER — Encounter: Payer: Self-pay | Admitting: Internal Medicine

## 2017-11-17 VITALS — BP 132/84 | HR 88 | Ht 70.0 in | Wt 196.6 lb

## 2017-11-17 DIAGNOSIS — E785 Hyperlipidemia, unspecified: Secondary | ICD-10-CM

## 2017-11-17 DIAGNOSIS — Z794 Long term (current) use of insulin: Secondary | ICD-10-CM

## 2017-11-17 DIAGNOSIS — E1121 Type 2 diabetes mellitus with diabetic nephropathy: Secondary | ICD-10-CM | POA: Diagnosis not present

## 2017-11-17 DIAGNOSIS — E663 Overweight: Secondary | ICD-10-CM | POA: Diagnosis not present

## 2017-11-17 LAB — POCT GLYCOSYLATED HEMOGLOBIN (HGB A1C): HEMOGLOBIN A1C: 6.3

## 2017-11-17 NOTE — Patient Instructions (Addendum)
Please continue: - Metformin 500 mg 2x a day with meals   - Tradjenta 5 mg in am - Levemir 10 units at bedtime  Use 2-5 units Novolog as needed.  Please return in 6 months with your sugar log.

## 2017-11-17 NOTE — Progress Notes (Signed)
Patient ID: [name] [name], male   DOB: 1950/12/22, 67 y.o.   MRN: [id number]  HPI: [name] [name] is a 67 y.o.-year-old male, returning for f/u for DM2, dx in 06/2012, insulin-dependent since 09/2012, uncontrolled, with complications (CKD stage 3, mild DR). Last visit 7 mo ago (he is not compliant with more frequent appointments).  Since last visit, he cut down portions, sweets >> sugars much better and he lost weight.  Last hemoglobin A1c was: Lab Results  Component Value Date   HGBA1C 7.3 (H) 04/03/2017   HGBA1C 6.6 09/11/2016   HGBA1C 6.8 02/16/2016   Pt is on a regimen of: - Metformin 500 mg twice a day with meals - Tradjenta 5 mg in am - Levemir 8-10 units at bedtime - Novolin R before meals (30 min before) - took it seldom since last visit  5 units before a smaller meal  8 units before a larger meal He had to stop Tradjenta 5 mg in the past as his [employer] did not cover it. Pays 47$ for 1 mo (tier 3). He heard about Victoza, but it is not very eager to try newer medicines.  He checks sugars ~1x a day and they are: - am:111-150, 180 >> 109-110s >> 78-148 >> 92-111 - 1h after b'fast: <150 >> max 120 >> n/c - before lunch: 110-140 >> 120-140 >> 120-140 >> n/c - 2h after lunch: 130-140 >> 98-110 >> n/c - before dinner:  120-140 >> 140-197 >> 90-136 - 2h after dinner: 120-140 >> <140 >> n/c - bedtime: 150s > 121, 140-142 >> n/c Lowest sugar was 78 >> 92  it is unclear at which level he has hypoglycemia awareness. Highest sugar was 190s >> 190 x1 (cookies).  + Mild CKD, last BUN/creatinine was:  Lab Results  Component Value Date   BUN 19 04/03/2017   CREATININE 1.62 (H) 04/03/2017   Last ACR normal: Lab Results  Component Value Date   MICRALBCREAT 2.5 04/03/2017   MICRALBCREAT 0.8 03/11/2016   MICRALBCREAT 1.9 12/29/2014   MICRALBCREAT 0.6 02/11/2013   + HL; last set of lipids: Lab Results  Component Value Date   CHOL 211 (H) 04/03/2017   HDL 40.30 04/03/2017   LDLCALC 124 (H) 12/29/2014   LDLDIRECT 92.0 04/03/2017   TRIG 364.0 (H) 04/03/2017   CHOLHDL 5 04/03/2017  On Crestor 5 every other day. Pt's last eye exam was In 2018: Bilateral mild DR, and also HTN retinopathy.  Dr. Zigmund Daniel.  He also has a history of nonischemic cardiomyopathy, with 2-D echo showing an EF of 42-35%, grade 1 diastolic dysfunction, severe LV H, normal LV size and mildly dilated LA. He also has hyperlipidemia, history of atrial fibrillation, COPD and GERD. No h/o pancreatitis.   He went to [place] in 04/2017 and to [place] for New Year's.  Son is an ophthalmologist.  ROS: Constitutional: + weight loss, no fatigue, no subjective hyperthermia, no subjective hypothermia Eyes: no blurry vision, no xerophthalmia ENT: no sore throat, no nodules palpated in throat, no dysphagia, no odynophagia, no hoarseness Cardiovascular: no CP/no SOB/no palpitations/no leg swelling Respiratory: no cough/no SOB/no wheezing Gastrointestinal: no N/no V/no D/no C/no acid reflux Musculoskeletal: no muscle aches/no joint aches Skin: no rashes, no hair loss Neurological: no tremors/no numbness/no tingling/no dizziness  I reviewed pt's medications, allergies, PMH, social hx, family hx, and changes were documented in the history of present illness. Otherwise, unchanged from my initial visit note.  PE: BP 132/84   Pulse 88   Ht 5\' 10"  (  1.778 m)   Wt 196 lb 9.6 oz (89.2 kg)   SpO2 97%   BMI 28.21 kg/m   Body mass index is 28.21 kg/m. Wt Readings from Last 3 Encounters:  11/17/17 196 lb 9.6 oz (89.2 kg)  06/06/17 204 lb 9.6 oz (92.8 kg)  04/16/17 204 lb (92.5 kg)   Constitutional: overweight, in NAD Eyes: PERRLA, EOMI, no exophthalmos ENT: moist mucous membranes, no thyromegaly, no cervical lymphadenopathy Cardiovascular: Bradycardia, RR, No MRG Respiratory: CTA B Gastrointestinal: abdomen soft, NT, ND, BS+ Musculoskeletal: no deformities, strength intact in all 4 Skin: moist, warm, no  rashes Neurological: no tremor with outstretched hands, DTR normal in all 4  ASSESSMENT: 1. DM2, insulin-dependent, uncontrolled, with complications - CKD stage III - mild DR  2. HL  He sees Dr Stanford Breed for Afib and cardiomyopathy (LV Htf)  3. Overweight  PLAN:  1. Patient with improved diabetes control since last visit after improving his diet.  He did not have to use NovoLog frequently since then.  In fact, in the last 1.5 months, he does not think he used it at all.  At last visit, he refused Victoza or glipizide.   - Sugars are all at goal now, with the exception of 1 hyperglycemic spike after cookies. - No changes are needed in his regimen, but I advised him to continue his diet, which is key in controlling his diabetes. I will still leave NovoLog On his medication list to use it as needed. - I advised him to: Patient Instructions  Please continue: - Metformin 500 mg 2x a day with meals   - Tradjenta 5 mg in am - Levemir 10 units at bedtime  Use 2-5 units Novolog as needed.  Please return in 6 months with your sugar log.    - today, HbA1c is 6.3% (better!) - continue checking sugars at different times of the day - check 1x a day, rotating checks - advised for yearly eye exams >> He is up-to-date - Return to clinic in 6 months with sugar log  2. HL - Reviewed latest lipid panel from 03/2017, LDL lower than 100, but triglycerides high - Continues the statin every other day without side effects. - the change is diet will surely help  3.Overweight - He lost 8 pounds by improving diet since last visit.  I congratulated him.  [name] Kingdom, MD PhD [facility] Endocrinology

## 2017-12-10 ENCOUNTER — Other Ambulatory Visit: Payer: Self-pay | Admitting: Internal Medicine

## 2018-01-09 ENCOUNTER — Other Ambulatory Visit: Payer: Self-pay | Admitting: Internal Medicine

## 2018-01-12 ENCOUNTER — Other Ambulatory Visit: Payer: Self-pay

## 2018-01-12 MED ORDER — GLUCOSE BLOOD VI STRP: ORAL_STRIP | 11 refills | Status: AC

## 2018-02-10 ENCOUNTER — Other Ambulatory Visit: Payer: Self-pay | Admitting: Internal Medicine

## 2018-02-16 ENCOUNTER — Ambulatory Visit: Payer: Self-pay | Admitting: Internal Medicine

## 2018-02-16 DIAGNOSIS — E119 Type 2 diabetes mellitus without complications: Secondary | ICD-10-CM | POA: Diagnosis not present

## 2018-02-16 DIAGNOSIS — G459 Transient cerebral ischemic attack, unspecified: Secondary | ICD-10-CM | POA: Diagnosis not present

## 2018-02-16 DIAGNOSIS — Z79899 Other long term (current) drug therapy: Secondary | ICD-10-CM | POA: Diagnosis not present

## 2018-02-16 DIAGNOSIS — R202 Paresthesia of skin: Secondary | ICD-10-CM | POA: Diagnosis not present

## 2018-02-16 DIAGNOSIS — Z833 Family history of diabetes mellitus: Secondary | ICD-10-CM | POA: Diagnosis not present

## 2018-02-16 DIAGNOSIS — Z880 Allergy status to penicillin: Secondary | ICD-10-CM | POA: Diagnosis not present

## 2018-02-16 DIAGNOSIS — I10 Essential (primary) hypertension: Secondary | ICD-10-CM | POA: Diagnosis not present

## 2018-02-16 DIAGNOSIS — I341 Nonrheumatic mitral (valve) prolapse: Secondary | ICD-10-CM | POA: Diagnosis not present

## 2018-02-16 DIAGNOSIS — Z8249 Family history of ischemic heart disease and other diseases of the circulatory system: Secondary | ICD-10-CM | POA: Diagnosis not present

## 2018-02-16 DIAGNOSIS — I429 Cardiomyopathy, unspecified: Secondary | ICD-10-CM | POA: Diagnosis not present

## 2018-02-16 DIAGNOSIS — Z9049 Acquired absence of other specified parts of digestive tract: Secondary | ICD-10-CM | POA: Diagnosis not present

## 2018-02-16 DIAGNOSIS — Z803 Family history of malignant neoplasm of breast: Secondary | ICD-10-CM | POA: Diagnosis not present

## 2018-02-16 DIAGNOSIS — F17210 Nicotine dependence, cigarettes, uncomplicated: Secondary | ICD-10-CM | POA: Diagnosis not present

## 2018-02-16 DIAGNOSIS — Z7982 Long term (current) use of aspirin: Secondary | ICD-10-CM | POA: Diagnosis not present

## 2018-02-16 DIAGNOSIS — R531 Weakness: Secondary | ICD-10-CM | POA: Diagnosis not present

## 2018-02-16 DIAGNOSIS — Z8042 Family history of malignant neoplasm of prostate: Secondary | ICD-10-CM | POA: Diagnosis not present

## 2018-02-16 DIAGNOSIS — Z800 Family history of malignant neoplasm of digestive organs: Secondary | ICD-10-CM | POA: Diagnosis not present

## 2018-02-16 DIAGNOSIS — E785 Hyperlipidemia, unspecified: Secondary | ICD-10-CM | POA: Diagnosis not present

## 2018-02-16 DIAGNOSIS — I4891 Unspecified atrial fibrillation: Secondary | ICD-10-CM | POA: Diagnosis not present

## 2018-02-16 DIAGNOSIS — I4892 Unspecified atrial flutter: Secondary | ICD-10-CM | POA: Diagnosis not present

## 2018-02-16 DIAGNOSIS — R4781 Slurred speech: Secondary | ICD-10-CM | POA: Diagnosis not present

## 2018-02-16 DIAGNOSIS — Z888 Allergy status to other drugs, medicaments and biological substances status: Secondary | ICD-10-CM | POA: Diagnosis not present

## 2018-02-16 DIAGNOSIS — Z794 Long term (current) use of insulin: Secondary | ICD-10-CM | POA: Diagnosis not present

## 2018-02-16 DIAGNOSIS — R200 Anesthesia of skin: Secondary | ICD-10-CM | POA: Diagnosis not present
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
## 2018-02-16 NOTE — Telephone Encounter (Addendum)
FYI:   Possible TIA. Pt is in [place], going to ED now.

## 2018-02-17 ENCOUNTER — Telehealth: Payer: Self-pay | Admitting: Internal Medicine

## 2018-02-17 DIAGNOSIS — Z803 Family history of malignant neoplasm of breast: Secondary | ICD-10-CM | POA: Diagnosis not present

## 2018-02-17 DIAGNOSIS — Z833 Family history of diabetes mellitus: Secondary | ICD-10-CM | POA: Diagnosis not present

## 2018-02-17 DIAGNOSIS — Z800 Family history of malignant neoplasm of digestive organs: Secondary | ICD-10-CM | POA: Diagnosis not present

## 2018-02-17 DIAGNOSIS — E785 Hyperlipidemia, unspecified: Secondary | ICD-10-CM | POA: Diagnosis present

## 2018-02-17 DIAGNOSIS — R202 Paresthesia of skin: Secondary | ICD-10-CM | POA: Diagnosis not present

## 2018-02-17 DIAGNOSIS — Z8249 Family history of ischemic heart disease and other diseases of the circulatory system: Secondary | ICD-10-CM | POA: Diagnosis not present

## 2018-02-17 DIAGNOSIS — I340 Nonrheumatic mitral (valve) insufficiency: Secondary | ICD-10-CM | POA: Diagnosis not present

## 2018-02-17 DIAGNOSIS — I10 Essential (primary) hypertension: Secondary | ICD-10-CM | POA: Diagnosis present

## 2018-02-17 DIAGNOSIS — R4781 Slurred speech: Secondary | ICD-10-CM | POA: Diagnosis not present

## 2018-02-17 DIAGNOSIS — Z888 Allergy status to other drugs, medicaments and biological substances status: Secondary | ICD-10-CM | POA: Diagnosis not present

## 2018-02-17 DIAGNOSIS — F17210 Nicotine dependence, cigarettes, uncomplicated: Secondary | ICD-10-CM | POA: Diagnosis present

## 2018-02-17 DIAGNOSIS — I341 Nonrheumatic mitral (valve) prolapse: Secondary | ICD-10-CM | POA: Diagnosis present

## 2018-02-17 DIAGNOSIS — I4891 Unspecified atrial fibrillation: Secondary | ICD-10-CM | POA: Diagnosis not present

## 2018-02-17 DIAGNOSIS — Z794 Long term (current) use of insulin: Secondary | ICD-10-CM | POA: Diagnosis not present

## 2018-02-17 DIAGNOSIS — I4892 Unspecified atrial flutter: Secondary | ICD-10-CM | POA: Diagnosis present

## 2018-02-17 DIAGNOSIS — I499 Cardiac arrhythmia, unspecified: Secondary | ICD-10-CM | POA: Diagnosis not present

## 2018-02-17 DIAGNOSIS — G459 Transient cerebral ischemic attack, unspecified: Secondary | ICD-10-CM | POA: Diagnosis present

## 2018-02-17 DIAGNOSIS — Z7982 Long term (current) use of aspirin: Secondary | ICD-10-CM | POA: Diagnosis not present

## 2018-02-17 DIAGNOSIS — I429 Cardiomyopathy, unspecified: Secondary | ICD-10-CM | POA: Diagnosis present

## 2018-02-17 DIAGNOSIS — I517 Cardiomegaly: Secondary | ICD-10-CM | POA: Diagnosis not present

## 2018-02-17 DIAGNOSIS — Z9049 Acquired absence of other specified parts of digestive tract: Secondary | ICD-10-CM | POA: Diagnosis not present

## 2018-02-17 DIAGNOSIS — Z79899 Other long term (current) drug therapy: Secondary | ICD-10-CM | POA: Diagnosis not present

## 2018-02-17 DIAGNOSIS — Z8042 Family history of malignant neoplasm of prostate: Secondary | ICD-10-CM | POA: Diagnosis not present

## 2018-02-17 DIAGNOSIS — E119 Type 2 diabetes mellitus without complications: Secondary | ICD-10-CM | POA: Diagnosis present

## 2018-02-17 DIAGNOSIS — Z880 Allergy status to penicillin: Secondary | ICD-10-CM | POA: Diagnosis not present

## 2018-02-17 NOTE — Telephone Encounter (Signed)
Patient scheduled 7/29.  Patient states he will bring notes from his hospital stay in [place].

## 2018-02-17 NOTE — Telephone Encounter (Signed)
Copied from [place] [id number]. Topic: Appointment Scheduling - Scheduling Inquiry for Clinic >> Feb 17, 2018  9:23 AM Bea Graff, NT wrote: Reason for CRM: Pt calling to set up his hospital follow-up. He is being discharged today from [facility] for TIA. He is wanting to see Dr. Ronnald Ramp early next week if he can be worked in. Please advise.

## 2018-02-18 ENCOUNTER — Telehealth: Payer: Self-pay | Admitting: *Deleted

## 2018-02-18 NOTE — Telephone Encounter (Signed)
Called pt to verify hosp f/u appt that has been scheduled for 02/23/18 w/Dr. Ronnald Ramp. Pt states yes he called and made appt yesterday was d/c from [facility] in [place]. Verified if pt was actually admitted or was seen at the ED. Pt states he was admitted 02/16/18 for sxs of mini stroke. Completed TCM call below.[name] [name]  Transition Care Management Follow-up Telephone Call   Date discharged? 02/17/18   How have you been since you were released from the hospital? Pt states he is doing great. He still need to have an MRI done since he ask to leave due to long stay at hospital. They did do labs, EKG/ECG, & CT SCAN. Will bring d/c summary, but also signed a release for them to fax Dr. Ronnald Ramp the reports   Do you understand why you were in the hospital? YES   Do you understand the discharge instructions? YES   Where were you discharged to? Home   Items Reviewed:  Medications reviewed: YES, pt sid states provider there recommend that he start taking plavix, but he stated he would talk over w/Dr. Ronnald Ramp first. He is still taking 81mg  aspirin.  Allergies reviewed: YES  Dietary changes reviewed: heart healthy  Referrals reviewed: No referral, but would like MD to order MRI   Functional Questionnaire:   Activities of Daily Living (ADLs):   He states he are independent in the following: ambulation, bathing and hygiene, feeding, continence, grooming, toileting and dressing States he doesn't require assistance    Any transportation issues/concerns?: NO   Any patient concerns? NO   Confirmed importance and date/time of follow-up visits scheduled YES, 02/23/18  Provider Appointment booked with Dr. Ronnald Ramp  Confirmed with patient if condition begins to worsen call PCP or go to the ER.  Patient was given the office number and encouraged to call back with question or concerns.  : YES

## 2018-02-23 ENCOUNTER — Encounter: Payer: Self-pay | Admitting: Internal Medicine

## 2018-02-23 ENCOUNTER — Other Ambulatory Visit (INDEPENDENT_AMBULATORY_CARE_PROVIDER_SITE_OTHER): Payer: Medicare Other

## 2018-02-23 ENCOUNTER — Ambulatory Visit (INDEPENDENT_AMBULATORY_CARE_PROVIDER_SITE_OTHER): Payer: Medicare Other | Admitting: Internal Medicine

## 2018-02-23 VITALS — BP 134/74 | HR 60 | Temp 97.7°F | Resp 16 | Ht 70.0 in | Wt 189.8 lb

## 2018-02-23 DIAGNOSIS — E785 Hyperlipidemia, unspecified: Secondary | ICD-10-CM | POA: Diagnosis not present

## 2018-02-23 DIAGNOSIS — Z794 Long term (current) use of insulin: Secondary | ICD-10-CM

## 2018-02-23 DIAGNOSIS — E1121 Type 2 diabetes mellitus with diabetic nephropathy: Secondary | ICD-10-CM

## 2018-02-23 DIAGNOSIS — J301 Allergic rhinitis due to pollen: Secondary | ICD-10-CM | POA: Diagnosis not present

## 2018-02-23 DIAGNOSIS — G459 Transient cerebral ischemic attack, unspecified: Secondary | ICD-10-CM

## 2018-02-23 DIAGNOSIS — I10 Essential (primary) hypertension: Secondary | ICD-10-CM | POA: Diagnosis not present

## 2018-02-23 LAB — MICROALBUMIN / CREATININE URINE RATIO
Creatinine,U: 181.8 mg/dL
MICROALB UR: 3.3 mg/dL — AB (ref 0.0–1.9)
Microalb Creat Ratio: 1.8 mg/g (ref 0.0–30.0)

## 2018-02-23 LAB — LIPID PANEL
CHOL/HDL RATIO: 4
CHOLESTEROL: 178 mg/dL (ref 0–200)
HDL: 48 mg/dL (ref >39.00)
LDL Cholesterol: 108 mg/dL — ABNORMAL HIGH (ref 0–99)
NonHDL: 129.75
TRIGLYCERIDES: 108 mg/dL (ref 0.0–149.0)
VLDL: 21.6 mg/dL (ref 0.0–40.0)

## 2018-02-23 LAB — CBC WITH DIFFERENTIAL/PLATELET
BASOS PCT: 0.8 % (ref 0.0–3.0)
Basophils Absolute: 0.1 10*3/uL (ref 0.0–0.1)
EOS PCT: 9.6 % — AB (ref 0.0–5.0)
Eosinophils Absolute: 0.7 10*3/uL (ref 0.0–0.7)
HCT: 41.6 % (ref 39.0–52.0)
Hemoglobin: 13.8 g/dL (ref 13.0–17.0)
LYMPHS ABS: 2 10*3/uL (ref 0.7–4.0)
Lymphocytes Relative: 28 % (ref 12.0–46.0)
MCHC: 33.1 g/dL (ref 30.0–36.0)
MCV: 95.5 fl (ref 78.0–100.0)
MONOS PCT: 7.7 % (ref 3.0–12.0)
Monocytes Absolute: 0.6 10*3/uL (ref 0.1–1.0)
NEUTROS ABS: 3.9 10*3/uL (ref 1.4–7.7)
NEUTROS PCT: 53.9 % (ref 43.0–77.0)
PLATELETS: 335 10*3/uL (ref 150.0–400.0)
RBC: 4.35 Mil/uL (ref 4.22–5.81)
RDW: 14.2 % (ref 11.5–15.5)
WBC: 7.3 10*3/uL (ref 4.0–10.5)

## 2018-02-23 LAB — BASIC METABOLIC PANEL
BUN: 20 mg/dL (ref 6–23)
CALCIUM: 9.8 mg/dL (ref 8.4–10.5)
CO2: 28 meq/L (ref 19–32)
Chloride: 99 mEq/L (ref 96–112)
Creatinine, Ser: 1.79 mg/dL — ABNORMAL HIGH (ref 0.40–1.50)
GFR: 48.66 mL/min — AB (ref >60.00)
Glucose, Bld: 164 mg/dL — ABNORMAL HIGH (ref 70–99)
Potassium: 4.1 mEq/L (ref 3.5–5.1)
SODIUM: 138 meq/L (ref 135–145)

## 2018-02-23 LAB — HEMOGLOBIN A1C: HEMOGLOBIN A1C: 6.9 % — AB (ref 4.6–6.5)

## 2018-02-23 MED ORDER — DIAZEPAM 5 MG PO TABS: 5.0000 mg | ORAL_TABLET | Freq: Four times a day (QID) | ORAL | 0 refills | Status: DC | PRN

## 2018-02-23 MED ORDER — CLOPIDOGREL BISULFATE 75 MG PO TABS: 75.0000 mg | ORAL_TABLET | Freq: Every day | ORAL | 0 refills | Status: DC

## 2018-02-23 MED ORDER — LEVOCETIRIZINE DIHYDROCHLORIDE 5 MG PO TABS: 5.0000 mg | ORAL_TABLET | Freq: Every evening | ORAL | 1 refills | Status: DC

## 2018-02-23 MED ORDER — MONTELUKAST SODIUM 10 MG PO TABS: 10.0000 mg | ORAL_TABLET | Freq: Every day | ORAL | 1 refills | Status: DC

## 2018-02-23 MED ORDER — ASPIRIN EC 81 MG PO TBEC: 81.0000 mg | DELAYED_RELEASE_TABLET | Freq: Every day | ORAL | 1 refills | Status: DC

## 2018-02-23 NOTE — Patient Instructions (Signed)
Transient Ischemic Attack A transient ischemic attack (TIA) is a "warning stroke" that causes stroke-like symptoms that then go away quickly. The symptoms of a TIA come on suddenly, and they last less than 24 hours. Unlike a stroke, a TIA does not cause permanent damage to the brain. It is important to know the symptoms of a TIA and what to do. Seek medical care right away, even if your symptoms go away. Having a TIA is a sign that you are at higher risk for a permanent stroke. Lifestyle changes and medical treatments can help prevent a stroke. What are the causes? This condition is caused by a temporary blockage in an artery in the head or neck. The blockage does not allow the brain to get the blood supply it needs and can cause various symptoms. The blockage can be caused by:  Fatty buildup in an artery in the head or neck (atherosclerosis).  A blood clot.  Tearing of an artery (dissection).  Inflammation of an artery (vasculitis).  Sometimes the cause is not known. What increases the risk? Certain factors may make you more likely to develop this condition. Some of these factors are things that you can change, such as:  Obesity.  Using products that contain nicotine or tobacco, such as cigarettes and e-cigarettes.  Taking oral birth control, especially if you also use tobacco.  Lack of physical inactivity.  Excessive use of alcohol.  Use of drugs, especially cocaine and methamphetamine.  Other risk factors include:  High blood pressure (hypertension).  High cholesterol.  Diabetes mellitus.  Heart disease (coronary artery disease).  Atrial fibrillation.  Being African American or Hispanic.  Being over the age of 60.  Being male.  Family history of stroke.  Previous history of blood clots, stroke, TIA, or heart attack.  Sickle cell disease.  Being a woman with a history of preeclampsia.  Migraine headache.  Sleep apnea.  Chronic inflammatory diseases, such  as rheumatoid arthritis or lupus.  Blood clotting disorders (hypercoagulable state).  What are the signs or symptoms? Symptoms of this condition are the same as those of a stroke, but they are temporary. The symptoms develop suddenly, and they go away quickly, usually within minutes to hours. Symptoms may include sudden:  Weakness or numbness in your face, arm, or leg, especially on one side of your body.  Trouble walking or difficulty moving your arms or legs.  Trouble speaking, understanding speech, or both (aphasia).  Vision changes in one or both eyes. These include double vision, blurred vision, or loss of vision.  Dizziness.  Confusion.  Loss of balance or coordination.  Nausea and vomiting.  Severe headache with no known cause.  If possible, make note of the exact time that you last felt like your normal self and what time your symptoms started. Tell your health care provider. How is this diagnosed? This condition may be diagnosed based on:  Your symptoms and medical history.  A physical exam.  Imaging tests, usually a CT or MRI scan of the brain.  Blood tests.  You may also have other tests, including:  Electrocardiogram (ECG).  Echocardiogram.  Carotid ultrasound.  A scan of the brain circulation (CT angiogram or MRI angiogram).  Continuous heart monitoring.  How is this treated? The goal of treatment is to reduce the risk for a subsequent stroke. Treatment may include stroke prevention therapies such as:  Changes to diet or lifestyle to decrease your risk. Lifestyle changes may include exercising and stopping smoking.    Medicines to thin the blood (antiplatelets or anticoagulants).  Blood pressure medicines.  Medicines to reduce cholesterol.  Treating other health conditions, such as diabetes or atrial fibrillation.  If testing shows that you have narrowing in the arteries to your brain, your health care provider may recommend a procedure, such  as:  Carotid endarterectomy. This is a surgery to remove the blockage from your artery.  Carotid angioplasty and stenting. This is a procedure to open or widen an artery in the neck using a metal mesh tube (stent). The stent helps keep the artery open by supporting the artery walls.  Follow these instructions at home: Medicines  Take over-the-counter and prescription medicines only as told by your health care provider.  If you were told to take a medicine to thin your blood, such as aspirin or an anticoagulant, take it exactly as told by your health care provider. ? Taking too much blood-thinning medicine can cause bleeding. ? If you do not take enough blood-thinning medicine, you will not have the protection that you need against a stroke and other problems. Eating and drinking  Eat 5 or more servings of fruits and vegetables each day.  Follow instructions from your health care provider about diet. You may need to follow a certain nutrition plan to help manage risk factors for stroke, such as high blood pressure, high cholesterol, diabetes, or obesity. This may include: ? Eating a low-fat, low-salt diet. ? Including a lot of fiber in your diet. ? Limiting the amount of carbohydrates and sugar in your diet.  Limit alcohol intake to no more than 1 drink a day for nonpregnant women and 2 drinks a day for men. One drink equals 12 oz of beer, 5 oz of wine, or 1 oz of hard liquor. General instructions  Maintain a healthy weight.  Stay physically active. Try to get at least 30 minutes of exercise on most or all days.  Find out if you have sleep apnea, and seek treatment if needed.  Do not use any products that contain nicotine or tobacco, such as cigarettes and e-cigarettes. If you need help quitting, ask your health care provider.  Do not abuse drugs.  Keep all follow-up visits as told by your health care provider. This is important. Where to find more information:  American Stroke  Association: www.strokeassociation.org  National Stroke Association: www.stroke.org Get help right away if:  You have chest pain or an irregular heartbeat.  You have any symptoms of stroke. The acronym BEFAST is an easy way to remember the main warning signs of stroke. ? B = Balance problems. Signs include dizziness, sudden trouble walking, or loss of balance. ? E = Eye problems. This includes trouble seeing or a sudden change in vision. ? F = Face changes. This includes sudden weakness or numbness of the face, or the face or eyelid drooping to one side. ? A = Arm weakness or numbness. This happens suddenly and usually on one side of the body. ? S = Speech problems. This includes trouble speaking or trouble understanding speech. ? T = Time. Time to call 911 or seek emergency care. Do not wait to see if symptoms will go away. Make note of the time your symptoms started.  Other signs of stroke may include: ? A sudden, severe headache with no known cause. ? Nausea or vomiting. ? Seizure. These symptoms may represent a serious problem that is an emergency. Do not wait to see if the symptoms will go away. Get   medical help right away. Call your local emergency services (911 in the [place] Do not drive yourself to the hospital. Summary  A TIA happens when an artery in the head or neck is blocked, leading to stroke-like symptoms that then go away quickly. The blockage clears before there is any permanent brain damage. A TIA is a medical emergency and requires immediate medical attention.  Symptoms of this condition are the same as those of a stroke, but they are temporary. The symptoms usually develop suddenly, and they go away quickly, usually within minutes to hours.  Having a TIA means that you are at high risk of a stroke in the near future.  Treatment may include medicines to thin the blood as well as medicines, diet changes, and lifestyle changes to manage conditions that increase the risk  of another TIA or a stroke. This information is not intended to replace advice given to you by your health care provider. Make sure you discuss any questions you have with your health care provider. Document Released: 04/24/2005 Document Revised: 08/27/2016 Document Reviewed: 08/27/2016 Elsevier Interactive Patient Education  2018 [employer]  

## 2018-02-23 NOTE — Progress Notes (Signed)
Subjective:  Patient ID: [name] [name], male    DOB: 08-14-1950  Age: 67 y.o. MRN: [id number]  CC: Hypertension; Hyperlipidemia; and Diabetes   HPI [name] [name] presents for f/up - About 2 weeks ago he had TIA symptoms.  He describes 15 seconds of left lower facial numbness, 30 seconds of right hand weakness, and slightly slurred speech that lasted about a minute.  He was seen at a hospital in [place] and says a CT scan of his brain was normal but he had claustrophobia so they did not do the MRI.  He tells me that an ultrasound of his carotids was negative for disease.  He told me his echocardiogram was normal.  He also tells me ultrasound of his abdomen was negative for aneurysm.  None of the records from the hospital in [place] are available to me at this time.  He tells me that the doctor there told him to start double antiplatelet therapy but they did not prescribe it.  He has a history of atrial fibrillation but has decided not to take an anticoagulant because it previously caused intestinal bleeding.  He tells me that all of his symptoms have resolved.  His only complaint now is a one-week history of nasal congestion, runny nose, and postnasal drip.  He has tried Nasonex nasal spray to treat this without much relief from the symptoms.  Outpatient Medications Prior to Visit  Medication Sig Dispense Refill  . albuterol (PROVENTIL HFA;VENTOLIN HFA) 108 (90 Base) MCG/ACT inhaler Inhale 2 puffs into the lungs every 6 (six) hours as needed for wheezing. 18 g 5  . amLODipine (NORVASC) 5 MG tablet Take 1 tablet (5 mg total) daily by mouth. 90 tablet 3  . fish oil-omega-3 fatty acids 1000 MG capsule Take 2 g by mouth daily.     Marland Kitchen glucose blood (ONETOUCH VERIO) test strip USE AS INSTRUCTED THREE TIMES DAILY. DX CODE E11.21 100 each 11  . Glycopyrrolate-Formoterol (BEVESPI AEROSPHERE) 9-4.8 MCG/ACT AERO Inhale 2 Act 2 (two) times daily into the lungs. 10.7 g 11  . hydrochlorothiazide  (HYDRODIURIL) 25 MG tablet TAKE 1 TABLET BY MOUTH ONCE DAILY 90 tablet 0  . Insulin Detemir (LEVEMIR FLEXPEN) 100 UNIT/ML Pen Inject 15 units once daily. 5 pen 1  . insulin regular (NOVOLIN R RELION) 100 units/mL injection INJECT 5-8 UNITS SUBCUTANEOUSLY THREE TIMES DAILY before meals 10 mL 3  . Insulin Syringe-Needle U-100 (EASY TOUCH INSULIN SYRINGE) 31G X 5/16" 0.3 ML MISC USE AS DIRECTED 4 TIMES DAILY 200 each 4  . linagliptin (TRADJENTA) 5 MG TABS tablet Take 1 tablet (5 mg total) by mouth daily. 30 tablet 11  . losartan (COZAAR) 100 MG tablet Take 1 tablet (100 mg total) daily by mouth. 90 tablet 3  . metFORMIN (GLUCOPHAGE) 500 MG tablet Take 1 tablet (500 mg total) by mouth 2 (two) times daily with a meal. 90 tablet 0  . metoprolol succinate (TOPROL-XL) 50 MG 24 hr tablet TAKE ONE TABLET BY MOUTH ONCE DAILY WITH OR IMMEDIATELY FOLLOWING A MEAL 90 tablet 3  . omeprazole (PRILOSEC) 20 MG capsule TAKE 1 CAPSULE BY MOUTH ONCE DAILY 30 capsule 5  . aspirin 81 MG chewable tablet Chew 81 mg by mouth daily.    . rosuvastatin (CRESTOR) 5 MG tablet TAKE ONE TABLET BY MOUTH 3 TIMES A WEEK 9 tablet 19  . metFORMIN (GLUCOPHAGE) 500 MG tablet TAKE 1 TABLET BY MOUTH TWICE DAILY WITH A MEAL 60 tablet 11   No facility-administered medications  prior to visit.     ROS Review of Systems  Constitutional: Negative for diaphoresis and fatigue.  HENT: Positive for congestion, postnasal drip and rhinorrhea. Negative for facial swelling, nosebleeds, sinus pressure, sinus pain, sneezing, sore throat and trouble swallowing.   Eyes: Negative.   Respiratory: Negative.  Negative for cough, chest tightness, shortness of breath and wheezing.   Cardiovascular: Negative.   Gastrointestinal: Negative.  Negative for abdominal pain, constipation, diarrhea, nausea and vomiting.  Genitourinary: Negative.  Negative for difficulty urinating and dysuria.  Musculoskeletal: Negative.  Negative for back pain, myalgias and neck  pain.  Skin: Negative.   Neurological: Negative.  Negative for dizziness, tremors, seizures, syncope, facial asymmetry, speech difficulty, weakness, light-headedness, numbness and headaches.  Hematological: Negative for adenopathy. Does not bruise/bleed easily.  Psychiatric/Behavioral: Negative.  The patient is not nervous/anxious.     Objective:  BP 134/74 (BP Location: Left Arm, Patient Position: Sitting, Cuff Size: Normal)   Pulse 60   Temp 97.7 F (36.5 C) (Oral)   Resp 16   Ht 5\' 10"  (1.778 m)   Wt 189 lb 12 oz (86.1 kg)   SpO2 97%   BMI 27.23 kg/m   BP Readings from Last 3 Encounters:  02/23/18 134/74  11/17/17 132/84  06/06/17 128/80    Wt Readings from Last 3 Encounters:  02/23/18 189 lb 12 oz (86.1 kg)  11/17/17 196 lb 9.6 oz (89.2 kg)  06/06/17 204 lb 9.6 oz (92.8 kg)    Physical Exam  Constitutional: He is oriented to person, place, and time. No distress.  HENT:  Nose: Mucosal edema and rhinorrhea present. No sinus tenderness. Right sinus exhibits no maxillary sinus tenderness and no frontal sinus tenderness. Left sinus exhibits no maxillary sinus tenderness and no frontal sinus tenderness.  Mouth/Throat: Oropharynx is clear and moist. No oropharyngeal exudate, posterior oropharyngeal edema or posterior oropharyngeal erythema.  Eyes: Conjunctivae are normal. No scleral icterus.  Neck: Normal range of motion. Neck supple. No JVD present. No thyromegaly present.  Cardiovascular: Normal rate, regular rhythm and normal heart sounds. Exam reveals no gallop.  No murmur heard. Pulmonary/Chest: Effort normal and breath sounds normal. He has no wheezes. He has no rales.  Abdominal: Soft. Normal appearance and bowel sounds are normal. He exhibits no mass. There is no hepatosplenomegaly. There is no tenderness. No hernia.  Musculoskeletal: Normal range of motion. He exhibits no edema, tenderness or deformity.  Lymphadenopathy:    He has no cervical adenopathy.    Neurological: He is alert and oriented to person, place, and time. He has normal strength. He displays no atrophy and no tremor. No cranial nerve deficit or sensory deficit. He exhibits normal muscle tone. He displays a negative Romberg sign. He displays no seizure activity. Coordination and gait normal. He displays no Babinski's sign on the right side. He displays no Babinski's sign on the left side.  Reflex Scores:      Tricep reflexes are 1+ on the right side and 1+ on the left side.      Bicep reflexes are 1+ on the right side and 1+ on the left side.      Brachioradialis reflexes are 1+ on the right side and 1+ on the left side.      Patellar reflexes are 1+ on the right side and 1+ on the left side.      Achilles reflexes are 1+ on the right side and 1+ on the left side. Skin: Skin is warm and dry. No rash  noted. He is not diaphoretic.  Psychiatric: He has a normal mood and affect. His behavior is normal. Judgment and thought content normal.  Vitals reviewed.   Lab Results  Component Value Date   WBC 7.3 02/23/2018   HGB 13.8 02/23/2018   HCT 41.6 02/23/2018   PLT 335.0 02/23/2018   GLUCOSE 164 (H) 02/23/2018   CHOL 178 02/23/2018   TRIG 108.0 02/23/2018   HDL 48.00 02/23/2018   LDLDIRECT 92.0 04/03/2017   LDLCALC 108 (H) 02/23/2018   ALT 10 09/04/2015   AST 15 09/04/2015   NA 138 02/23/2018   K 4.1 02/23/2018   CL 99 02/23/2018   CREATININE 1.79 (H) 02/23/2018   BUN 20 02/23/2018   CO2 28 02/23/2018   TSH 2.42 09/16/2016   PSA 1.32 09/16/2016   INR 0.91 10/19/2012   HGBA1C 6.9 (H) 02/23/2018   MICROALBUR 3.3 (H) 02/23/2018    No results found.  Assessment & Plan:   Darsh was seen today for hypertension, hyperlipidemia and diabetes.  Diagnoses and all orders for this visit:  TIA (transient ischemic attack)- I have asked him to undergo an MRI to see if he had a CVA.  Will offer Valium to help him with the claustrophobia related to the MRI.  Will start dual  antiplatelet therapy to be to reduce his risk of a CV event. -     MR Brain Wo Contrast; Future -     diazepam (VALIUM) 5 MG tablet; Take 1 tablet (5 mg total) by mouth every 6 (six) hours as needed for anxiety. -     aspirin EC 81 MG tablet; Take 1 tablet (81 mg total) by mouth daily. -     clopidogrel (PLAVIX) 75 MG tablet; Take 1 tablet (75 mg total) by mouth daily.  Essential hypertension, benign- His blood pressure is adequately well controlled.  Electrolytes and renal function are normal. -     CBC with Differential/Platelet; Future -     Basic metabolic panel; Future  Type 2 diabetes mellitus with diabetic nephropathy, with long-term current use of insulin (HCC)-his A1c is at 6.9%.  His blood sugars are not adequately well controlled. -     Hemoglobin A1c; Future -     Microalbumin / creatinine urine ratio; Future -     Ambulatory referral to Ophthalmology  Hyperlipidemia with target LDL less than 100- He has an elevated ASCVD risk score and it looks like he is not compliant with the statin therapy.  I have asked him to restart rosuvastatin. -     Lipid panel; Future -     rosuvastatin (CRESTOR) 5 MG tablet; Take 1 tablet (5 mg total) by mouth at bedtime.  Seasonal allergic rhinitis due to pollen -     levocetirizine (XYZAL) 5 MG tablet; Take 1 tablet (5 mg total) by mouth every evening. -     montelukast (SINGULAIR) 10 MG tablet; Take 1 tablet (10 mg total) by mouth at bedtime.   I have discontinued Otis Rickles's aspirin. I have also changed his rosuvastatin. Additionally, I am having him start on diazepam, aspirin EC, clopidogrel, levocetirizine, and montelukast. Lastly, I am having him maintain his fish oil-omega-3 fatty acids, Insulin Syringe-Needle U-100, Insulin Detemir, linagliptin, metFORMIN, insulin regular, amLODipine, losartan, metoprolol succinate, Glycopyrrolate-Formoterol, omeprazole, albuterol, glucose blood, and hydrochlorothiazide.  Meds ordered this encounter    Medications  . diazepam (VALIUM) 5 MG tablet    Sig: Take 1 tablet (5 mg total) by mouth every 6 (six)  hours as needed for anxiety.    Dispense:  5 tablet    Refill:  0  . aspirin EC 81 MG tablet    Sig: Take 1 tablet (81 mg total) by mouth daily.    Dispense:  90 tablet    Refill:  1  . clopidogrel (PLAVIX) 75 MG tablet    Sig: Take 1 tablet (75 mg total) by mouth daily.    Dispense:  90 tablet    Refill:  0  . levocetirizine (XYZAL) 5 MG tablet    Sig: Take 1 tablet (5 mg total) by mouth every evening.    Dispense:  90 tablet    Refill:  1  . montelukast (SINGULAIR) 10 MG tablet    Sig: Take 1 tablet (10 mg total) by mouth at bedtime.    Dispense:  90 tablet    Refill:  1  . rosuvastatin (CRESTOR) 5 MG tablet    Sig: Take 1 tablet (5 mg total) by mouth at bedtime.    Dispense:  90 tablet    Refill:  1    Please consider 90 day supplies to promote better adherence     Follow-up: Return in about 2 months (around 04/26/2018).  Scarlette Calico, MD

## 2018-02-24 ENCOUNTER — Encounter: Payer: Self-pay | Admitting: Internal Medicine

## 2018-02-24 ENCOUNTER — Other Ambulatory Visit: Payer: Self-pay | Admitting: Internal Medicine

## 2018-02-24 DIAGNOSIS — Z794 Long term (current) use of insulin: Principal | ICD-10-CM

## 2018-02-24 DIAGNOSIS — E785 Hyperlipidemia, unspecified: Secondary | ICD-10-CM

## 2018-02-24 DIAGNOSIS — E1121 Type 2 diabetes mellitus with diabetic nephropathy: Secondary | ICD-10-CM

## 2018-02-24 MED ORDER — ROSUVASTATIN CALCIUM 10 MG PO TABS: 10.0000 mg | ORAL_TABLET | Freq: Every day | ORAL | 1 refills | Status: DC

## 2018-02-24 MED ORDER — ROSUVASTATIN CALCIUM 5 MG PO TABS: 5.0000 mg | ORAL_TABLET | Freq: Every day | ORAL | 1 refills | Status: DC

## 2018-03-04 ENCOUNTER — Ambulatory Visit (INDEPENDENT_AMBULATORY_CARE_PROVIDER_SITE_OTHER): Payer: Medicare Other | Admitting: *Deleted

## 2018-03-04 VITALS — BP 126/72 | HR 53 | Resp 18 | Ht 70.0 in | Wt 189.0 lb

## 2018-03-04 DIAGNOSIS — Z0000 Encounter for general adult medical examination without abnormal findings: Secondary | ICD-10-CM

## 2018-03-04 NOTE — Patient Instructions (Addendum)
Continue doing brain stimulating activities (puzzles, reading, adult coloring books, staying active) to keep memory sharp.   Continue to eat heart healthy diet (full of fruits, vegetables, whole grains, lean protein, water--limit salt, fat, and sugar intake) and increase physical activity as tolerated.   [name] [name] , Thank you for taking time to come for your Medicare Wellness Visit. I appreciate your ongoing commitment to your health goals. Please review the following plan we discussed and let me know if I can assist you in the future.   These are the goals we discussed: Goals    . 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  Enjoy life and family.       This is a list of the screening recommended for you and due dates:  Health Maintenance  Topic Date Due  . Eye exam for diabetics  03/30/2015  . Flu Shot  02/26/2018  . Complete foot exam   04/03/2018  . Hemoglobin A1C  08/26/2018  . Colon Cancer Screening  08/10/2019  . Tetanus Vaccine  09/03/2025  .  Hepatitis C: One time screening is recommended by Center for Disease Control  (CDC) for  adults born from [birth year] through 1967.   Completed  . Pneumonia vaccines  Completed      Health Maintenance, Male A healthy lifestyle and preventive care is important for your health and wellness. Ask your health care provider about what schedule of regular examinations is right for you. What should I know about weight and diet? Eat a Healthy Diet  Eat plenty of vegetables, fruits, whole grains, low-fat dairy products, and lean protein.  Do not eat a lot of foods high in solid fats, added sugars, or salt.  Maintain a Healthy Weight Regular exercise can help you achieve or maintain a healthy weight. You should:  Do at least 150 minutes of exercise each week. The exercise should increase your heart rate and make you sweat (moderate-intensity  exercise).  Do strength-training exercises at least twice a week.  Watch Your Levels of Cholesterol and Blood Lipids  Have your blood tested for lipids and cholesterol every 5 years starting at 69 years of age. If you are at high risk for heart disease, you should start having your blood tested when you are 69 years old. You may need to have your cholesterol levels checked more often if: ? Your lipid or cholesterol levels are high. ? You are older than 69 years of age. ? You are at high risk for heart disease.  What should I know about cancer screening? Many types of cancers can be detected early and may often be prevented. Lung Cancer  You should be screened every year for lung cancer if: ? You are a current smoker who has smoked for at least 30 years. ? You are a former smoker who has quit within the past 15 years.  Talk to your health care provider about your screening options, when you should start screening, and how often you should be screened.  Colorectal Cancer  Routine colorectal cancer screening usually begins at 69 years of age and should be repeated every 5-10 years until you are 69 years old. You may need to be screened more often if early forms of precancerous polyps or small growths are found. Your health care provider may recommend screening at an earlier age if you have risk factors  for colon cancer.  Your health care provider may recommend using home test kits to check for hidden blood in the stool.  A small camera at the end of a tube can be used to examine your colon (sigmoidoscopy or colonoscopy). This checks for the earliest forms of colorectal cancer.  Prostate and Testicular Cancer  Depending on your age and overall health, your health care provider may do certain tests to screen for prostate and testicular cancer.  Talk to your health care provider about any symptoms or concerns you have about testicular or prostate cancer.  Skin Cancer  Check your skin  from head to toe regularly.  Tell your health care provider about any new moles or changes in moles, especially if: ? There is a change in a mole's size, shape, or color. ? You have a mole that is larger than a pencil eraser.  Always use sunscreen. Apply sunscreen liberally and repeat throughout the day.  Protect yourself by wearing long sleeves, pants, a wide-brimmed hat, and sunglasses when outside.  What should I know about heart disease, diabetes, and high blood pressure?  If you are 33-55 years of age, have your blood pressure checked every 3-5 years. If you are 75 years of age or older, have your blood pressure checked every year. You should have your blood pressure measured twice-once when you are at a hospital or clinic, and once when you are not at a hospital or clinic. Record the average of the two measurements. To check your blood pressure when you are not at a hospital or clinic, you can use: ? An automated blood pressure machine at a pharmacy. ? A home blood pressure monitor.  Talk to your health care provider about your target blood pressure.  If you are between 48-42 years old, ask your health care provider if you should take aspirin to prevent heart disease.  Have regular diabetes screenings by checking your fasting blood sugar level. ? If you are at a normal weight and have a low risk for diabetes, have this test once every three years after the age of 55. ? If you are overweight and have a high risk for diabetes, consider being tested at a younger age or more often.  A one-time screening for abdominal aortic aneurysm (AAA) by ultrasound is recommended for men aged 46-75 years who are current or former smokers. What should I know about preventing infection? Hepatitis B If you have a higher risk for hepatitis B, you should be screened for this virus. Talk with your health care provider to find out if you are at risk for hepatitis B infection. Hepatitis C Blood testing is  recommended for:  Everyone born from [birth year] through 1967.  Anyone with known risk factors for hepatitis C.  Sexually Transmitted Diseases (STDs)  You should be screened each year for STDs including gonorrhea and chlamydia if: ? You are sexually active and are younger than 69 years of age. ? You are older than 69 years of age and your health care provider tells you that you are at risk for this type of infection. ? Your sexual activity has changed since you were last screened and you are at an increased risk for chlamydia or gonorrhea. Ask your health care provider if you are at risk.  Talk with your health care provider about whether you are at high risk of being infected with HIV. Your health care provider may recommend a prescription medicine to help prevent HIV infection.  What  else can I do?  Schedule regular health, dental, and eye exams.  Stay current with your vaccines (immunizations).  Do not use any tobacco products, such as cigarettes, chewing tobacco, and e-cigarettes. If you need help quitting, ask your health care provider.  Limit alcohol intake to no more than 2 drinks per day. One drink equals 12 ounces of beer, 5 ounces of Carime Dinkel, or 1 ounces of hard liquor.  Do not use street drugs.  Do not share needles.  Ask your health care provider for help if you need support or information about quitting drugs.  Tell your health care provider if you often feel depressed.  Tell your health care provider if you have ever been abused or do not feel safe at home. This information is not intended to replace advice given to you by your health care provider. Make sure you discuss any questions you have with your health care provider. Document Released: 01/11/2008 Document Revised: 03/13/2016 Document Reviewed: 04/18/2015 Elsevier Interactive Patient Education  [employer].

## 2018-03-04 NOTE — Progress Notes (Addendum)
Subjective:   [name] [name] is a 67 y.o. male who presents for Medicare Annual/Subsequent preventive examination.  Review of Systems:  No ROS.  Medicare Wellness Visit. Additional risk factors are reflected in the social history.  Cardiac Risk Factors include: advanced age (>22men, >15 women);diabetes mellitus;dyslipidemia;male gender;hypertension Sleep patterns: feels rested on waking, gets up 1 times nightly to void and sleeps 6-7 hours nightly.    Home Safety/Smoke Alarms: Feels safe in home. Smoke alarms in place.  Living environment; residence and Firearm Safety: 2-story house, no firearms. Lives alone, no needs for DME, good support system Seat Belt Safety/Bike Helmet: Wears seat belt.   PSA-  Lab Results  Component Value Date   PSA 1.32 09/16/2016   PSA 1.79 03/11/2016   PSA 1.28 03/15/2014       Objective:    Vitals: BP 126/72   Pulse (!) 53   Resp 18   Ht 5\' 10"  (1.778 m)   Wt 189 lb (85.7 kg)   SpO2 99%   BMI 27.12 kg/m   Body mass index is 27.12 kg/m.  Advanced Directives 03/04/2018 03/22/2017 02/28/2017 10/18/2012 10/17/2012  Does Patient Have a Medical Advance Directive? Yes No No Patient does not have advance directive Patient does not have advance directive  Type of Advance Directive Wisdom;Living will - - - -  Would patient like information on creating a medical advance directive? - - Yes (ED - Information included in AVS) - -  Pre-existing out of facility DNR order (yellow form or pink MOST form) - - - No No    Tobacco Social History   Tobacco Use  Smoking Status Former Smoker  . Types: Cigarettes  . Last attempt to quit: 07/30/2011  . Years since quitting: 6.6  Smokeless Tobacco Never Used  Tobacco Comment   Smoked since age 28     Counseling given: Not Answered Comment: Smoked since age 15  Past Medical History:  Diagnosis Date  . Allergy   . Asthma   . Atrial fibrillation ([place])    a. initially occuring in 2014, has  declined anticoagulation since having a GI bleed with initiation of Xarelto  . Cardiomyopathy 2011   a. Patient was told that he had an ejection fraction of 37% by echo done elsewhere in 2011 b. 06/2014: echo showing preserved EF of 55-60%, severe asymetric septal hypertrophy with no SAM or LVOT gradient  . COPD (chronic obstructive pulmonary disease) ([place])   . Diabetes mellitus without complication ([place])   . Diverticulitis   . GERD (gastroesophageal reflux disease)   . History of colon polyps   . Hypertension   . Tobacco abuse    Past Surgical History:  Procedure Laterality Date  . COLON SURGERY  2009  . COLONOSCOPY     Family History  Problem Relation Age of Onset  . Prostate cancer Father   . Colon cancer Father   . Diabetes type II Mother   . Atrial fibrillation Mother   . Hypertension Mother   . Arthritis Other        parents  . Cancer Other        Colon,Breast, Prostate Cancer  . Hypertension Other   . Diabetes Other        parents  . Prostate cancer Brother        5 brothers - 2 were dx with prostate cancer  . Esophageal cancer Neg Hx   . Stomach cancer Neg Hx   . Rectal cancer  Neg Hx    Social History   Socioeconomic History  . Marital status: Single    Spouse name: Not on file  . Number of children: 2  . Years of education: Not on file  . Highest education level: Not on file  Occupational History  . Not on file  Social Needs  . Financial resource strain: Not hard at all  . Food insecurity:    Worry: Never true    Inability: Never true  . Transportation needs:    Medical: No    Non-medical: No  Tobacco Use  . Smoking status: Former Smoker    Types: Cigarettes    Last attempt to quit: 07/30/2011    Years since quitting: 6.6  . Smokeless tobacco: Never Used  . Tobacco comment: Smoked since age 19  Substance and Sexual Activity  . Alcohol use: Yes    Alcohol/week: 0.6 oz    Types: 1 Standard drinks or equivalent per week  . Drug use: No  . Sexual  activity: Not Currently  Lifestyle  . Physical activity:    Days per week: 3 days    Minutes per session: 50 min  . Stress: Not at all  Relationships  . Social connections:    Talks on phone: More than three times a week    Gets together: More than three times a week    Attends religious service: Never    Active member of club or organization: Yes    Attends meetings of clubs or organizations: More than 4 times per year    Relationship status: Not on file  Other Topics Concern  . Not on file  Social History Narrative  . Not on file    Outpatient Encounter Medications as of 03/04/2018  Medication Sig  . albuterol (PROVENTIL HFA;VENTOLIN HFA) 108 (90 Base) MCG/ACT inhaler Inhale 2 puffs into the lungs every 6 (six) hours as needed for wheezing.  Marland Kitchen amLODipine (NORVASC) 5 MG tablet Take 1 tablet (5 mg total) daily by mouth.  Marland Kitchen aspirin EC 81 MG tablet Take 1 tablet (81 mg total) by mouth daily.  . clopidogrel (PLAVIX) 75 MG tablet Take 1 tablet (75 mg total) by mouth daily.  . diazepam (VALIUM) 5 MG tablet Take 1 tablet (5 mg total) by mouth every 6 (six) hours as needed for anxiety.  . fish oil-omega-3 fatty acids 1000 MG capsule Take 2 g by mouth daily.   Marland Kitchen glucose blood (ONETOUCH VERIO) test strip USE AS INSTRUCTED THREE TIMES DAILY. DX CODE E11.21  . Glycopyrrolate-Formoterol (BEVESPI AEROSPHERE) 9-4.8 MCG/ACT AERO Inhale 2 Act 2 (two) times daily into the lungs.  . hydrochlorothiazide (HYDRODIURIL) 25 MG tablet TAKE 1 TABLET BY MOUTH ONCE DAILY  . Insulin Detemir (LEVEMIR FLEXPEN) 100 UNIT/ML Pen Inject 15 units once daily.  . insulin regular (NOVOLIN R RELION) 100 units/mL injection INJECT 5-8 UNITS SUBCUTANEOUSLY THREE TIMES DAILY before meals  . Insulin Syringe-Needle U-100 (EASY TOUCH INSULIN SYRINGE) 31G X 5/16" 0.3 ML MISC USE AS DIRECTED 4 TIMES DAILY  . levocetirizine (XYZAL) 5 MG tablet Take 1 tablet (5 mg total) by mouth every evening.  . linagliptin (TRADJENTA) 5 MG TABS  tablet Take 1 tablet (5 mg total) by mouth daily.  Marland Kitchen losartan (COZAAR) 100 MG tablet Take 1 tablet (100 mg total) daily by mouth.  . metFORMIN (GLUCOPHAGE) 500 MG tablet Take 1 tablet (500 mg total) by mouth 2 (two) times daily with a meal.  . metoprolol succinate (TOPROL-XL) 50 MG  24 hr tablet TAKE ONE TABLET BY MOUTH ONCE DAILY WITH OR IMMEDIATELY FOLLOWING A MEAL  . montelukast (SINGULAIR) 10 MG tablet Take 1 tablet (10 mg total) by mouth at bedtime.  Marland Kitchen omeprazole (PRILOSEC) 20 MG capsule TAKE 1 CAPSULE BY MOUTH ONCE DAILY  . rosuvastatin (CRESTOR) 10 MG tablet Take 1 tablet (10 mg total) by mouth daily.   No facility-administered encounter medications on file as of 03/04/2018.     Activities of Daily Living In your present state of health, do you have any difficulty performing the following activities: 03/04/2018  Hearing? N  Vision? N  Difficulty concentrating or making decisions? N  Walking or climbing stairs? N  Dressing or bathing? N  Doing errands, shopping? N  Preparing Food and eating ? N  Using the Toilet? N  In the past six months, have you accidently leaked urine? N  Do you have problems with loss of bowel control? N  Managing your Medications? N  Managing your Finances? N  Housekeeping or managing your Housekeeping? N  Some recent data might be hidden    Patient Care Team: Janith Lima, MD as PCP - General (Internal Medicine)   Assessment:   This is a routine wellness examination for [place]. Physical assessment deferred to PCP.   Exercise Activities and Dietary recommendations Current Exercise Habits: The patient has a physically strenous job, but has no regular exercise apart from work., Time (Minutes): 60, Frequency (Times/Week): 4, Weekly Exercise (Minutes/Week): 240, Intensity: Mild, Exercise limited by: None identified  Diet (meal preparation, eat out, water intake, caffeinated beverages, dairy products, fruits and vegetables): in general, a "healthy" diet  ,  well balanced   Reviewed heart healthy and diabetic diet. Encouraged patient to increase daily water and healthy fluid intake.  Goals    . 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  Enjoy life and family.       Fall Risk Fall Risk  03/04/2018 02/28/2017 02/17/2017 03/15/2014  Falls in the past year? No No Yes No  Comment - - Emmi Telephone Survey: data to providers prior to load -  Number falls in past yr: - - 1 -  Comment - - Emmi Telephone Survey Actual Response = 1 -  Injury with Fall? - - No -    Depression Screen PHQ 2/9 Scores 03/04/2018 02/28/2017 03/15/2014  PHQ - 2 Score 0 1 0  PHQ- 9 Score - 2 -    Cognitive Function       Ad8 score reviewed for issues:  Issues making decisions: no  Less interest in hobbies / activities: no  Repeats questions, stories (family complaining): no  Trouble using ordinary gadgets (microwave, computer, phone):no  Forgets the month or year: no  Mismanaging finances: no  Remembering appts: no  Daily problems with thinking and/or memory: no Ad8 score is= 0  Immunization History  Administered Date(s) Administered  . Influenza Split 04/29/2011  . Influenza, High Dose Seasonal PF 04/30/2016, 04/03/2017  . Influenza,inj,Quad PF,6+ Mos 05/18/2013, 06/02/2014, 04/25/2015  . Pneumococcal Conjugate-13 03/15/2014  . Pneumococcal Polysaccharide-23 07/29/2009, 02/28/2017  . Tdap 07/29/2005, 09/04/2015   Screening Tests Health Maintenance  Topic Date Due  . OPHTHALMOLOGY EXAM  03/30/2015  . INFLUENZA VACCINE  02/26/2018  . FOOT EXAM  04/03/2018  . HEMOGLOBIN A1C  08/26/2018  . COLONOSCOPY  08/10/2019  . TETANUS/TDAP  09/03/2025  .  Hepatitis C Screening  Completed  . PNA vac Low Risk Adult  Completed      Plan:    Continue doing brain stimulating activities (puzzles, reading, adult coloring books, staying active) to keep memory sharp.    Continue to eat heart healthy diet (full of fruits, vegetables, whole grains, lean protein, water--limit salt, fat, and sugar intake) and increase physical activity as tolerated.  I have personally reviewed and noted the following in the patient's chart:   . Medical and social history . Use of alcohol, tobacco or illicit drugs  . Current medications and supplements . Functional ability and status . Nutritional status . Physical activity . Advanced directives . List of other physicians . Vitals . Screenings to include cognitive, depression, and falls . Referrals and appointments  In addition, I have reviewed and discussed with patient certain preventive protocols, quality metrics, and best practice recommendations. A written personalized care plan for preventive services as well as general preventive health recommendations were provided to patient.     Michiel Cowboy, RN  03/04/2018  Medical screening examination/treatment/procedure(s) were performed by non-physician practitioner and as supervising physician I was immediately available for consultation/collaboration. I agree with above. Scarlette Calico, MD

## 2018-03-11 NOTE — Progress Notes (Signed)
done

## 2018-03-15 ENCOUNTER — Other Ambulatory Visit: Payer: Self-pay | Admitting: Internal Medicine

## 2018-03-16 ENCOUNTER — Other Ambulatory Visit: Payer: Self-pay | Admitting: Internal Medicine

## 2018-03-16 DIAGNOSIS — I4892 Unspecified atrial flutter: Secondary | ICD-10-CM | POA: Insufficient documentation

## 2018-03-18 ENCOUNTER — Other Ambulatory Visit: Payer: Medicare Other

## 2018-03-26 ENCOUNTER — Inpatient Hospital Stay: Admission: RE | Admit: 2018-03-26 | Payer: Medicare Other | Source: Ambulatory Visit

## 2018-04-25 ENCOUNTER — Other Ambulatory Visit: Payer: Self-pay | Admitting: Internal Medicine

## 2018-05-19 ENCOUNTER — Ambulatory Visit (INDEPENDENT_AMBULATORY_CARE_PROVIDER_SITE_OTHER): Payer: Medicare Other | Admitting: Internal Medicine

## 2018-05-19 ENCOUNTER — Encounter: Payer: Self-pay | Admitting: Internal Medicine

## 2018-05-19 VITALS — BP 130/80 | HR 65 | Ht 70.0 in | Wt 192.0 lb

## 2018-05-19 DIAGNOSIS — E663 Overweight: Secondary | ICD-10-CM

## 2018-05-19 DIAGNOSIS — Z23 Encounter for immunization: Secondary | ICD-10-CM | POA: Diagnosis not present

## 2018-05-19 DIAGNOSIS — E785 Hyperlipidemia, unspecified: Secondary | ICD-10-CM

## 2018-05-19 DIAGNOSIS — E1121 Type 2 diabetes mellitus with diabetic nephropathy: Secondary | ICD-10-CM

## 2018-05-19 DIAGNOSIS — Z794 Long term (current) use of insulin: Secondary | ICD-10-CM | POA: Diagnosis not present

## 2018-05-19 LAB — POCT GLYCOSYLATED HEMOGLOBIN (HGB A1C): Hemoglobin A1C: 6.1 % — AB (ref 4.0–5.6)

## 2018-05-19 NOTE — Progress Notes (Signed)
Patient ID: [name] [name], male   DOB: 1950-09-14, 67 y.o.   MRN: [id number]  HPI: [name] [name] is a 67 y.o.-year-old male, returning for f/u for DM2, dx in 06/2012, insulin-dependent since 09/2012, uncontrolled, with complications (CKD stage 3, mild DR, TIA - 01/2018). Last visit 6 mo ago.  He continues to cut down his portions and limit sweets and he lost another several pounds since last visit.  He had a TIA in 67 - was in the hospital in [place]. Started on Plavix.  Last hemoglobin A1c was higher though: Lab Results  Component Value Date   HGBA1C 6.9 (H) 02/23/2018   HGBA1C 6.3 11/17/2017   HGBA1C 7.3 (H) 04/03/2017   Pt is on a regimen of: - Metformin 500 mg 2x a day with meals - Tradjenta 5 mg in am - Levemir 10 units at bedtime [- Novolog 2-5 units as needed] - 1-2x since last visit He had to stop Tradjenta 5 mg in the past as his [employer] did not cover it. Pays 47$ for 1 mo (tier 3). He heard about Victoza, but it is not very eager to try newer medicines.  He checks sugars ~1x a day and they are: - am: 109-110s >> 78-148 >> 92-111 >> ave 110 - 1h after b'fast: <150 >> max 120 >> n/c - before lunch: 120-140 >> 120-140 >> n/c >> ave 92 - 2h after lunch: 130-140 >> 98-110 >> n/c - before dinner:  140-197 >> 90-136 >> 115-117 - 2h after dinner: 120-140 >> <140 >> n/c - bedtime: 150s > 121, 140-142 >> n/c Lowest sugar was 78 >> 92 >> 73; it is unclear at which level he has hypoglycemia awareness Highest sugar was 190s >> 190 x1 (cookies) >> 194 (raisins).  + Mild CKD, last BUN/creatinine was:  Lab Results  Component Value Date   BUN 20 02/23/2018   CREATININE 1.79 (H) 02/23/2018   Last ACR reviewed and this was normal: Lab Results  Component Value Date   MICRALBCREAT 1.8 02/23/2018   MICRALBCREAT 2.5 04/03/2017   MICRALBCREAT 0.8 03/11/2016   MICRALBCREAT 1.9 12/29/2014   MICRALBCREAT 0.6 02/11/2013   + HL; last set of lipids: Lab Results  Component  Value Date   CHOL 178 02/23/2018   HDL 48.00 02/23/2018   LDLCALC 108 (H) 02/23/2018   LDLDIRECT 92.0 04/03/2017   TRIG 108.0 02/23/2018   CHOLHDL 4 02/23/2018  On Crestor 5 every other day >> now daily. 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  Dr. Zigmund [name].  He also has a history of nonischemic cardiomyopathy, with 2-D echo showing an EF of 14-48%, grade 1 diastolic dysfunction, severe LV H, normal LV size and mildly dilated LA. He also has hyperlipidemia, history of atrial fibrillation, COPD and GERD. No h/o pancreatitis.   He went to [place] in 04/2017 and to [place] for New Year's.  His son is an ophthalmologist.  ROS: Constitutional: + weight loss, no fatigue, no subjective hyperthermia, no subjective hypothermia Eyes: no blurry vision, no xerophthalmia ENT: no sore throat, no nodules palpated in neck, no dysphagia, no odynophagia, no hoarseness Cardiovascular: no CP/no SOB/no palpitations/no leg swelling Respiratory: no cough/no SOB/no wheezing Gastrointestinal: no N/no V/no D/no C/no acid reflux Musculoskeletal: no muscle aches/no joint aches Skin: no rashes, no hair loss Neurological: no tremors/no numbness/no tingling/no dizziness  I reviewed pt's medications, allergies, PMH, social hx, family hx, and changes were documented in the history of present illness. Otherwise, unchanged from my initial visit  note.  Past Medical History:  Diagnosis Date  . Allergy   . Asthma   . Atrial fibrillation ([place])    a. initially occuring in 2014, has declined anticoagulation since having a GI bleed with initiation of Xarelto  . Cardiomyopathy 2011   a. Patient was told that he had an ejection fraction of 37% by echo done elsewhere in 2011 b. 06/2014: echo showing preserved EF of 55-60%, severe asymetric septal hypertrophy with no SAM or LVOT gradient  . COPD (chronic obstructive pulmonary disease) ([place])   . Diabetes mellitus without complication ([place])   .  Diverticulitis   . GERD (gastroesophageal reflux disease)   . History of colon polyps   . Hypertension   . Tobacco abuse    Past Surgical History:  Procedure Laterality Date  . COLON SURGERY  2009  . COLONOSCOPY     Social History   Socioeconomic History  . Marital status: Single    Spouse name: Not on file  . Number of children: 2  . Years of education: Not on file  . Highest education level: Not on file  Occupational History  . Not on file  Social Needs  . Financial resource strain: Not hard at all  . Food insecurity:    Worry: Never true    Inability: Never true  . Transportation needs:    Medical: No    Non-medical: No  Tobacco Use  . Smoking status: Former Smoker    Types: Cigarettes    Last attempt to quit: 07/30/2011    Years since quitting: 6.8  . Smokeless tobacco: Never Used  . Tobacco comment: Smoked since age 49  Substance and Sexual Activity  . Alcohol use: Yes    Alcohol/week: 1.0 standard drinks    Types: 1 Standard drinks or equivalent per week  . Drug use: No  . Sexual activity: Not Currently  Lifestyle  . Physical activity:    Days per week: 3 days    Minutes per session: 50 min  . Stress: Not at all  Relationships  . Social connections:    Talks on phone: More than three times a week    Gets together: More than three times a week    Attends religious service: Never    Active member of club or organization: Yes    Attends meetings of clubs or organizations: More than 4 times per year    Relationship status: Not on file  . Intimate partner violence:    Fear of current or ex partner: Not on file    Emotionally abused: Not on file    Physically abused: Not on file    Forced sexual activity: Not on file  Other Topics Concern  . Not on file  Social History Narrative  . Not on file   Current Outpatient Medications on File Prior to Visit  Medication Sig Dispense Refill  . albuterol (PROVENTIL HFA;VENTOLIN HFA) 108 (90 Base) MCG/ACT inhaler  Inhale 2 puffs into the lungs every 6 (six) hours as needed for wheezing. 18 g 5  . amLODipine (NORVASC) 5 MG tablet Take 1 tablet (5 mg total) daily by mouth. 90 tablet 3  . aspirin EC 81 MG tablet Take 1 tablet (81 mg total) by mouth daily. 90 tablet 1  . clopidogrel (PLAVIX) 75 MG tablet Take 1 tablet (75 mg total) by mouth daily. 90 tablet 0  . diazepam (VALIUM) 5 MG tablet Take 1 tablet (5 mg total) by mouth every 6 (six) hours  as needed for anxiety. 5 tablet 0  . fish oil-omega-3 fatty acids 1000 MG capsule Take 2 g by mouth daily.     Marland Kitchen glucose blood (ONETOUCH VERIO) test strip USE AS INSTRUCTED THREE TIMES DAILY. DX CODE E11.21 100 each 11  . Glycopyrrolate-Formoterol (BEVESPI AEROSPHERE) 9-4.8 MCG/ACT AERO Inhale 2 Act 2 (two) times daily into the lungs. 10.7 g 11  . hydrochlorothiazide (HYDRODIURIL) 25 MG tablet TAKE 1 TABLET BY MOUTH ONCE DAILY 90 tablet 0  . Insulin Detemir (LEVEMIR FLEXPEN) 100 UNIT/ML Pen Inject 15 units once daily. 5 pen 1  . insulin regular (NOVOLIN R RELION) 100 units/mL injection INJECT 5-8 UNITS SUBCUTANEOUSLY THREE TIMES DAILY before meals 10 mL 3  . Insulin Syringe-Needle U-100 (EASY TOUCH INSULIN SYRINGE) 31G X 5/16" 0.3 ML MISC USE AS DIRECTED 4 TIMES DAILY 200 each 4  . levocetirizine (XYZAL) 5 MG tablet Take 1 tablet (5 mg total) by mouth every evening. 90 tablet 1  . linagliptin (TRADJENTA) 5 MG TABS tablet Take 1 tablet (5 mg total) by mouth daily. 30 tablet 11  . losartan (COZAAR) 100 MG tablet Take 1 tablet (100 mg total) daily by mouth. 90 tablet 3  . metFORMIN (GLUCOPHAGE) 500 MG tablet Take 1 tablet (500 mg total) by mouth 2 (two) times daily with a meal. 90 tablet 0  . metoprolol succinate (TOPROL-XL) 50 MG 24 hr tablet TAKE ONE TABLET BY MOUTH ONCE DAILY WITH OR IMMEDIATELY FOLLOWING A MEAL 90 tablet 3  . montelukast (SINGULAIR) 10 MG tablet Take 1 tablet (10 mg total) by mouth at bedtime. 90 tablet 1  . NOVOLIN R RELION 100 UNIT/ML injection  INJECT 3 TO 6 UNITS SUBCUTANEOUSLY THREE TIMES DAILY 10 mL 2  . omeprazole (PRILOSEC) 20 MG capsule TAKE 1 CAPSULE BY MOUTH ONCE DAILY 30 capsule 5  . rosuvastatin (CRESTOR) 10 MG tablet Take 1 tablet (10 mg total) by mouth daily. 90 tablet 1   No current facility-administered medications on file prior to visit.    Allergies  Allergen Reactions  . Hydralazine Shortness Of Breath  . Lisinopril Cough  . Penicillins Other (See Comments)    [place]  . Tribenzor [Olmesartan-Amlodipine-Hctz] Nausea Only    dizziness   Family History  Problem Relation Age of Onset  . Prostate cancer Father   . Colon cancer Father   . Diabetes type II Mother   . Atrial fibrillation Mother   . Hypertension Mother   . Arthritis Other        parents  . Cancer Other        Colon,Breast, Prostate Cancer  . Hypertension Other   . Diabetes Other        parents  . Prostate cancer Brother        5 brothers - 2 were dx with prostate cancer  . Esophageal cancer Neg Hx   . Stomach cancer Neg Hx   . Rectal cancer Neg Hx     PE: BP 130/80   Pulse 65   Ht 5\' 10"  (1.778 m) Comment: measured  Wt 192 lb (87.1 kg)   SpO2 98%   BMI 27.55 kg/m  Body mass index is 27.55 kg/m. Wt Readings from Last 3 Encounters:  05/19/18 192 lb (87.1 kg)  03/04/18 189 lb (85.7 kg)  02/23/18 189 lb 12 oz (86.1 kg)   Constitutional: overweight, in NAD Eyes: PERRLA, EOMI, no exophthalmos ENT: moist mucous membranes, no thyromegaly, no cervical lymphadenopathy Cardiovascular: RRR, No MRG Respiratory: CTA B  Gastrointestinal: abdomen soft, NT, ND, BS+ Musculoskeletal: no deformities, strength intact in all 4 Skin: moist, warm, no rashes Neurological: no tremor with outstretched hands, DTR normal in all 4   ASSESSMENT: 1. DM2, insulin-dependent, uncontrolled, with complications - CKD stage III - mild DR  2. HL  He sees Dr Stanford Breed for Afib and cardiomyopathy (LV Htf)  3. Overweight  PLAN:  1. Patient with  long-standing controlled diabetes, on oral medication regimen and low-dose of basal insulin.  He is using rapid acting insulin rarely, as needed.  He refused adding a sulfonylurea or a GLP-1 receptor agonist in the past. - Sugars continue to stay at goal, with exception of hyperglycemic spikes after dietary indiscretions (sweets) - We will not change his regimen for now and continue to use low doses of NovoLog as needed.  Continue with improvement in his diet. - Reviewed together his latest HbA1c and this was higher, at 6.9%, but still at goal, however, today it is 6.1% (improved). - I advised him to: Patient Instructions  Please continue: - Metformin 500 mg 2x a day with meals   - Tradjenta 5 mg in am - Levemir 10 units at bedtime Use 2 to 5 units of NovoLog as needed.  Please return in 6 months with your sugar log.    - continue checking sugars at different times of the day - check 1x a day, rotating checks - advised for yearly eye exams >> he is due - scheduled for 08/2017 - Return to clinic in 6 mo with sugar log   2. HL - Reviewed latest lipid panel from 01/2018: LDL above target, increased, triglycerides improved Lab Results  Component Value Date   CHOL 178 02/23/2018   HDL 48.00 02/23/2018   LDLCALC 108 (H) 02/23/2018   LDLDIRECT 92.0 04/03/2017   TRIG 108.0 02/23/2018   CHOLHDL 4 02/23/2018  - Continues low-dose Crestor without side effects.  3.Overweight -He lost another 2 pounds since last visit, 8 pounds before last visit.  I congratulated him.  Philemon Kingdom, MD PhD [facility]

## 2018-05-19 NOTE — Patient Instructions (Signed)
Please continue: - Metformin 500 mg 2x a day with meals   - Tradjenta 5 mg in am - Levemir 10 units at bedtime Use 2 to 5 units of NovoLog as needed.  Please return in 6 months with your sugar log.

## 2018-05-25 ENCOUNTER — Other Ambulatory Visit: Payer: Self-pay | Admitting: Internal Medicine

## 2018-05-27 ENCOUNTER — Encounter: Payer: Self-pay | Admitting: *Deleted

## 2018-06-11 NOTE — Progress Notes (Signed)
HPI: FU atrial fibrillation. Patient was admitted in March 2014 with encephalopathy and hyperosmolar nonketotic state. Patient developed atrial fibrillation which was treated with Cardizem. He converted to sinus rhythm. Echocardiogram March 2014 showed an ejection fraction of 65-70%, severe left ventricular hypertrophy, hyperdynamic LV function, left ventricular outflow track gradient of 2.5 m/s. Repeat study 12/15 showed severe assymetric LVH with no LVOT gradient; normal LV function, mild LAE and mild MR. 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 FU recommended 2 years.  I last saw patient May 2016.  At that time we ordered follow-up ultrasound for iliac aneurysm and cardiac MRI to exclude hypertrophic cardiomyopathy but studies not performed.  Seen November 2018 by Melvyn Neth and found to be in atrial fibrillation.  He refused anticoagulation at that time.  Since last seen, he denies dyspnea, chest pain, palpitations or syncope.  He has no bleeding.  Patient states he had a TIA approximately 9 months ago.  Was seen in [place].  Current Outpatient Medications  Medication Sig Dispense Refill  . albuterol (PROVENTIL HFA;VENTOLIN HFA) 108 (90 Base) MCG/ACT inhaler Inhale 2 puffs into the lungs every 6 (six) hours as needed for wheezing. 18 g 5  . amLODipine (NORVASC) 5 MG tablet Take 1 tablet (5 mg total) daily by mouth. 90 tablet 3  . aspirin EC 81 MG tablet Take 1 tablet (81 mg total) by mouth daily. 90 tablet 1  . clopidogrel (PLAVIX) 75 MG tablet Take 1 tablet (75 mg total) by mouth daily. 90 tablet 0  . diazepam (VALIUM) 5 MG tablet Take 1 tablet (5 mg total) by mouth every 6 (six) hours as needed for anxiety. 5 tablet 0  . fish oil-omega-3 fatty acids 1000 MG capsule Take 2 g by mouth daily.     Marland Kitchen glucose blood (ONETOUCH VERIO) test strip USE AS INSTRUCTED THREE TIMES DAILY. DX CODE E11.21 100 each 11  . Glycopyrrolate-Formoterol (BEVESPI  AEROSPHERE) 9-4.8 MCG/ACT AERO Inhale 2 Act 2 (two) times daily into the lungs. 10.7 g 11  . hydrochlorothiazide (HYDRODIURIL) 25 MG tablet TAKE 1 TABLET BY MOUTH ONCE DAILY 90 tablet 0  . Insulin Detemir (LEVEMIR FLEXPEN) 100 UNIT/ML Pen Inject 15 units once daily. 5 pen 1  . insulin regular (NOVOLIN R RELION) 100 units/mL injection INJECT 5-8 UNITS SUBCUTANEOUSLY THREE TIMES DAILY before meals 10 mL 3  . Insulin Syringe-Needle U-100 (EASY TOUCH INSULIN SYRINGE) 31G X 5/16" 0.3 ML MISC USE AS DIRECTED 4 TIMES DAILY 200 each 4  . levocetirizine (XYZAL) 5 MG tablet Take 1 tablet (5 mg total) by mouth every evening. 90 tablet 1  . linagliptin (TRADJENTA) 5 MG TABS tablet Take 1 tablet (5 mg total) by mouth daily. 30 tablet 11  . losartan (COZAAR) 100 MG tablet Take 1 tablet (100 mg total) daily by mouth. 90 tablet 3  . metFORMIN (GLUCOPHAGE) 500 MG tablet Take 1 tablet (500 mg total) by mouth 2 (two) times daily with a meal. 90 tablet 0  . metoprolol succinate (TOPROL-XL) 50 MG 24 hr tablet TAKE 1 TABLET BY MOUTH ONCE DAILY WITH  OR  IMMEDIATELY  FOLLOWING  A  MEAL 30 tablet 0  . montelukast (SINGULAIR) 10 MG tablet Take 1 tablet (10 mg total) by mouth at bedtime. 90 tablet 1  . NOVOLIN R RELION 100 UNIT/ML injection INJECT 3 TO 6 UNITS SUBCUTANEOUSLY THREE TIMES DAILY 10 mL 2  . omeprazole (PRILOSEC) 20 MG capsule TAKE 1 CAPSULE  BY MOUTH ONCE DAILY 30 capsule 5  . rosuvastatin (CRESTOR) 10 MG tablet Take 1 tablet (10 mg total) by mouth daily. 90 tablet 1   No current facility-administered medications for this visit.      Past Medical History:  Diagnosis Date  . Allergy   . Asthma   . Atrial fibrillation ([place])    a. initially occuring in 2014, has declined anticoagulation since having a GI bleed with initiation of Xarelto  . Cardiomyopathy 2011   a. Patient was told that he had an ejection fraction of 37% by echo done elsewhere in 2011 b. 06/2014: echo showing preserved EF of 55-60%, severe  asymetric septal hypertrophy with no SAM or LVOT gradient  . COPD (chronic obstructive pulmonary disease) ([place])   . Diabetes mellitus without complication ([place])   . Diverticulitis   . GERD (gastroesophageal reflux disease)   . History of colon polyps   . Hypertension   . Tobacco abuse     Past Surgical History:  Procedure Laterality Date  . COLON SURGERY  2009  . COLONOSCOPY      Social History   Socioeconomic History  . Marital status: Single    Spouse name: Not on file  . Number of children: 2  . Years of education: Not on file  . Highest education level: Not on file  Occupational History  . Not on file  Social Needs  . Financial resource strain: Not hard at all  . Food insecurity:    Worry: Never true    Inability: Never true  . Transportation needs:    Medical: No    Non-medical: No  Tobacco Use  . Smoking status: Former Smoker    Types: Cigarettes    Last attempt to quit: 07/30/2011    Years since quitting: 6.8  . Smokeless tobacco: Never Used  . Tobacco comment: Smoked since age 32  Substance and Sexual Activity  . Alcohol use: Yes    Alcohol/week: 1.0 standard drinks    Types: 1 Standard drinks or equivalent per week  . Drug use: No  . Sexual activity: Not Currently  Lifestyle  . Physical activity:    Days per week: 3 days    Minutes per session: 50 min  . Stress: Not at all  Relationships  . Social connections:    Talks on phone: More than three times a week    Gets together: More than three times a week    Attends religious service: Never    Active member of club or organization: Yes    Attends meetings of clubs or organizations: More than 4 times per year    Relationship status: Not on file  . Intimate partner violence:    Fear of current or ex partner: Not on file    Emotionally abused: Not on file    Physically abused: Not on file    Forced sexual activity: Not on file  Other Topics Concern  . Not on file  Social History Narrative  . Not  on file    Family History  Problem Relation Age of Onset  . Prostate cancer Father   . Colon cancer Father   . Diabetes Father   . Arthritis Father   . Diabetes type II Mother   . Atrial fibrillation Mother   . Hypertension Mother   . Arthritis Mother   . Cancer Other        Colon,Breast, Prostate Cancer  . Hypertension Other   . Prostate cancer  Brother        5 brothers - 2 were dx with prostate cancer  . Esophageal cancer Neg Hx   . Stomach cancer Neg Hx   . Rectal cancer Neg Hx     ROS: no fevers or chills, productive cough, hemoptysis, dysphasia, odynophagia, melena, hematochezia, dysuria, hematuria, rash, seizure activity, orthopnea, PND, pedal edema, claudication. Remaining systems are negative.  Physical Exam: Well-developed well-nourished in no acute distress.  Skin is warm and dry.  HEENT is normal.  Neck is supple.  Chest is clear to auscultation with normal expansion.  Cardiovascular exam is regular rate and rhythm.  Abdominal exam nontender or distended. No masses palpated. Extremities show no edema. neuro grossly intact  ECG-atrial flutter at a rate of 71.  Left axis deviation.  No ST changes.  Personally reviewed  A/P  1 paroxysmal atrial fibrillation-patient in atrial flutter today. No symptoms. Continue metoprolol.  I again reviewed anticoagulation with him.  I explained the risk of CVA.  He is agreeable to apixaban.  Begin 5 mg twice daily.  Discontinue aspirin and Plavix.  Check hemoglobin and renal function in 4 weeks.  Our plan will be rate control and anticoagulation as he is asymptomatic.  2 iliac artery aneurysm-we will arrange follow-up ultrasound.  3 hyperlipidemia-continue statin.  4 hypertension-patient's blood pressure is controlled.  Continue present medications and follow.  5 cardiomyopathy-we attempted to arrange cardiac MRI previously but he was unable to tolerate because of claustrophobia.  This was despite Valium.  I will plan to  repeat echocardiogram.  6 chronic stage III kidney disease-Per primary care.  Kirk Ruths, MD

## 2018-06-15 ENCOUNTER — Other Ambulatory Visit: Payer: Self-pay | Admitting: Student

## 2018-06-19 ENCOUNTER — Ambulatory Visit (INDEPENDENT_AMBULATORY_CARE_PROVIDER_SITE_OTHER): Payer: Medicare Other | Admitting: Cardiology

## 2018-06-19 ENCOUNTER — Encounter: Payer: Self-pay | Admitting: Cardiology

## 2018-06-19 VITALS — BP 138/60 | HR 71 | Ht 70.0 in | Wt 193.6 lb

## 2018-06-19 DIAGNOSIS — I480 Paroxysmal atrial fibrillation: Secondary | ICD-10-CM | POA: Diagnosis not present

## 2018-06-19 DIAGNOSIS — I723 Aneurysm of iliac artery: Secondary | ICD-10-CM | POA: Diagnosis not present

## 2018-06-19 DIAGNOSIS — I428 Other cardiomyopathies: Secondary | ICD-10-CM | POA: Diagnosis not present

## 2018-06-19 DIAGNOSIS — I10 Essential (primary) hypertension: Secondary | ICD-10-CM

## 2018-06-19 DIAGNOSIS — E7800 Pure hypercholesterolemia, unspecified: Secondary | ICD-10-CM

## 2018-06-19 MED ORDER — APIXABAN 5 MG PO TABS: 5.0000 mg | ORAL_TABLET | Freq: Two times a day (BID) | ORAL | 6 refills | Status: AC

## 2018-06-19 NOTE — Patient Instructions (Signed)
Medication Instructions:  STOP ASPIRIN  STOP PLAVIX  START ELIQUIS 5 MG TWICE DAILY If you need a refill on your cardiac medications before your next appointment, please call your pharmacy.   Lab work: Your physician recommends that you return for lab work in: [place] If you have labs (blood work) drawn today and your tests are completely normal, you will receive your results only by: Marland Kitchen MyChart Message (if you have MyChart) OR . A paper copy in the mail If you have any lab test that is abnormal or we need to change your treatment, we will call you to review the results.  Testing/Procedures: Your physician has requested that you have an echocardiogram. Echocardiography is a painless test that uses sound waves to create images of your heart. It provides your doctor with information about the size and shape of your heart and how well your heart's chambers and valves are working. This procedure takes approximately one hour. There are no restrictions for this procedure.   Your physician has requested that you have an abdominal aorta duplex. During this test, an ultrasound is used to evaluate the aorta. Allow 30 minutes for this exam. Do not eat after midnight the day before and avoid carbonated beverages   Follow-Up: At [facility], you and your health needs are our priority.  As part of our continuing mission to provide you with exceptional heart care, we have created designated Provider Care Teams.  These Care Teams include your primary Cardiologist (physician) and Advanced Practice Providers (APPs -  Physician Assistants and Nurse Practitioners) who all work together to provide you with the care you need, when you need it. You will need a follow up appointment in 6 months.  Please call our office 2 months in advance to schedule this appointment.  You may see Kirk Ruths MD or one of the following Advanced Practice Providers on your designated Care Team:   Kerin Ransom, PA-C Roby Lofts,  [place] . Sande Rives, PA-C

## 2018-06-26 ENCOUNTER — Other Ambulatory Visit: Payer: Self-pay | Admitting: Student

## 2018-06-26 ENCOUNTER — Other Ambulatory Visit: Payer: Self-pay | Admitting: Internal Medicine

## 2018-07-02 ENCOUNTER — Other Ambulatory Visit: Payer: Self-pay

## 2018-07-02 ENCOUNTER — Ambulatory Visit (HOSPITAL_COMMUNITY): Payer: Medicare Other | Attending: Cardiovascular Disease

## 2018-07-02 DIAGNOSIS — I480 Paroxysmal atrial fibrillation: Secondary | ICD-10-CM

## 2018-07-09 ENCOUNTER — Ambulatory Visit (HOSPITAL_COMMUNITY)
Admission: RE | Admit: 2018-07-09 | Payer: Medicare Other | Source: Ambulatory Visit | Attending: Cardiology | Admitting: Cardiology

## 2018-07-15 ENCOUNTER — Other Ambulatory Visit: Payer: Self-pay | Admitting: Student

## 2018-07-18 ENCOUNTER — Other Ambulatory Visit: Payer: Self-pay | Admitting: Cardiology

## 2018-07-27 ENCOUNTER — Emergency Department (HOSPITAL_COMMUNITY): Payer: Medicare Other

## 2018-07-27 ENCOUNTER — Ambulatory Visit: Payer: Medicare Other | Admitting: Family Medicine

## 2018-07-27 ENCOUNTER — Ambulatory Visit (HOSPITAL_BASED_OUTPATIENT_CLINIC_OR_DEPARTMENT_OTHER)
Admission: RE | Admit: 2018-07-27 | Discharge: 2018-07-27 | Disposition: A | Discharge status: Home / Self Care | Payer: Medicare Other | Source: Ambulatory Visit | Attending: Cardiology | Admitting: Cardiology

## 2018-07-27 ENCOUNTER — Observation Stay (HOSPITAL_COMMUNITY)
Admission: EM | Admit: 2018-07-27 | Discharge: 2018-07-31 | Disposition: A | Discharge status: Home / Self Care | Payer: Medicare Other | Attending: Internal Medicine | Admitting: Internal Medicine

## 2018-07-27 ENCOUNTER — Other Ambulatory Visit: Payer: Self-pay | Admitting: *Deleted

## 2018-07-27 DIAGNOSIS — Z880 Allergy status to penicillin: Secondary | ICD-10-CM | POA: Diagnosis not present

## 2018-07-27 DIAGNOSIS — E785 Hyperlipidemia, unspecified: Secondary | ICD-10-CM | POA: Diagnosis not present

## 2018-07-27 DIAGNOSIS — Z79899 Other long term (current) drug therapy: Secondary | ICD-10-CM | POA: Insufficient documentation

## 2018-07-27 DIAGNOSIS — Z87891 Personal history of nicotine dependence: Secondary | ICD-10-CM | POA: Insufficient documentation

## 2018-07-27 DIAGNOSIS — K838 Other specified diseases of biliary tract: Secondary | ICD-10-CM

## 2018-07-27 DIAGNOSIS — Z794 Long term (current) use of insulin: Secondary | ICD-10-CM | POA: Insufficient documentation

## 2018-07-27 DIAGNOSIS — N182 Chronic kidney disease, stage 2 (mild): Secondary | ICD-10-CM | POA: Diagnosis not present

## 2018-07-27 DIAGNOSIS — I10 Essential (primary) hypertension: Secondary | ICD-10-CM

## 2018-07-27 DIAGNOSIS — R932 Abnormal findings on diagnostic imaging of liver and biliary tract: Secondary | ICD-10-CM | POA: Diagnosis not present

## 2018-07-27 DIAGNOSIS — E1121 Type 2 diabetes mellitus with diabetic nephropathy: Secondary | ICD-10-CM

## 2018-07-27 DIAGNOSIS — Z7901 Long term (current) use of anticoagulants: Secondary | ICD-10-CM | POA: Diagnosis not present

## 2018-07-27 DIAGNOSIS — R198 Other specified symptoms and signs involving the digestive system and abdomen: Secondary | ICD-10-CM

## 2018-07-27 DIAGNOSIS — K5730 Diverticulosis of large intestine without perforation or abscess without bleeding: Secondary | ICD-10-CM | POA: Diagnosis not present

## 2018-07-27 DIAGNOSIS — K219 Gastro-esophageal reflux disease without esophagitis: Secondary | ICD-10-CM | POA: Insufficient documentation

## 2018-07-27 DIAGNOSIS — I429 Cardiomyopathy, unspecified: Secondary | ICD-10-CM | POA: Insufficient documentation

## 2018-07-27 DIAGNOSIS — I723 Aneurysm of iliac artery: Secondary | ICD-10-CM

## 2018-07-27 DIAGNOSIS — R17 Unspecified jaundice: Secondary | ICD-10-CM

## 2018-07-27 DIAGNOSIS — E1122 Type 2 diabetes mellitus with diabetic chronic kidney disease: Secondary | ICD-10-CM | POA: Diagnosis not present

## 2018-07-27 DIAGNOSIS — I129 Hypertensive chronic kidney disease with stage 1 through stage 4 chronic kidney disease, or unspecified chronic kidney disease: Secondary | ICD-10-CM | POA: Diagnosis not present

## 2018-07-27 DIAGNOSIS — B179 Acute viral hepatitis, unspecified: Secondary | ICD-10-CM | POA: Diagnosis not present

## 2018-07-27 DIAGNOSIS — K869 Disease of pancreas, unspecified: Secondary | ICD-10-CM | POA: Diagnosis not present

## 2018-07-27 DIAGNOSIS — J449 Chronic obstructive pulmonary disease, unspecified: Secondary | ICD-10-CM | POA: Diagnosis not present

## 2018-07-27 DIAGNOSIS — I4891 Unspecified atrial fibrillation: Secondary | ICD-10-CM | POA: Diagnosis present

## 2018-07-27 DIAGNOSIS — F40240 Claustrophobia: Secondary | ICD-10-CM | POA: Insufficient documentation

## 2018-07-27 DIAGNOSIS — R7989 Other specified abnormal findings of blood chemistry: Secondary | ICD-10-CM | POA: Insufficient documentation

## 2018-07-27 DIAGNOSIS — R945 Abnormal results of liver function studies: Secondary | ICD-10-CM

## 2018-07-27 DIAGNOSIS — K7689 Other specified diseases of liver: Secondary | ICD-10-CM | POA: Diagnosis not present

## 2018-07-27 DIAGNOSIS — I700 Atherosclerosis of aorta: Secondary | ICD-10-CM | POA: Insufficient documentation

## 2018-07-27 DIAGNOSIS — I480 Paroxysmal atrial fibrillation: Secondary | ICD-10-CM | POA: Insufficient documentation

## 2018-07-27 DIAGNOSIS — Z888 Allergy status to other drugs, medicaments and biological substances status: Secondary | ICD-10-CM | POA: Insufficient documentation

## 2018-07-27 LAB — COMPREHENSIVE METABOLIC PANEL
ALK PHOS: 178 U/L — AB (ref 38–126)
ALT: 401 U/L — ABNORMAL HIGH (ref 0–44)
ALT: 378 U/L — AB (ref 0–44)
AST: 380 U/L — ABNORMAL HIGH (ref 15–41)
AST: 351 U/L — ABNORMAL HIGH (ref 15–41)
Albumin: 3.8 g/dL (ref 3.5–5.0)
Albumin: 3.6 g/dL (ref 3.5–5.0)
Alkaline Phosphatase: 199 U/L — ABNORMAL HIGH (ref 38–126)
Anion gap: 12 (ref 5–15)
Anion gap: 10 (ref 5–15)
BUN: 14 mg/dL (ref 8–23)
BUN: 14 mg/dL (ref 8–23)
CO2: 28 mmol/L (ref 22–32)
CO2: 28 mmol/L (ref 22–32)
Calcium: 9.5 mg/dL (ref 8.9–10.3)
Calcium: 9.1 mg/dL (ref 8.9–10.3)
Chloride: 97 mmol/L — ABNORMAL LOW (ref 98–111)
Chloride: 100 mmol/L (ref 98–111)
Creatinine, Ser: 1.39 mg/dL — ABNORMAL HIGH (ref 0.61–1.24)
Creatinine, Ser: 1.4 mg/dL — ABNORMAL HIGH (ref 0.61–1.24)
GFR calc Af Amer: 60 mL/min — ABNORMAL LOW (ref >60)
GFR calc Af Amer: 59 mL/min — ABNORMAL LOW (ref >60)
GFR calc non Af Amer: 51 mL/min — ABNORMAL LOW (ref >60)
GFR calc non Af Amer: 51 mL/min — ABNORMAL LOW (ref >60)
Glucose, Bld: 119 mg/dL — ABNORMAL HIGH (ref 70–99)
Glucose, Bld: 132 mg/dL — ABNORMAL HIGH (ref 70–99)
Potassium: 3.6 mmol/L (ref 3.5–5.1)
Potassium: 3.8 mmol/L (ref 3.5–5.1)
SODIUM: 137 mmol/L (ref 135–145)
Sodium: 138 mmol/L (ref 135–145)
Total Bilirubin: 9.8 mg/dL — ABNORMAL HIGH (ref 0.3–1.2)
Total Bilirubin: 8.9 mg/dL — ABNORMAL HIGH (ref 0.3–1.2)
Total Protein: 8.3 g/dL — ABNORMAL HIGH (ref 6.5–8.1)
Total Protein: 7.6 g/dL (ref 6.5–8.1)

## 2018-07-27 LAB — URINALYSIS, ROUTINE W REFLEX MICROSCOPIC
Glucose, UA: NEGATIVE mg/dL
Hgb urine dipstick: NEGATIVE
KETONES UR: NEGATIVE mg/dL
Leukocytes, UA: NEGATIVE
Nitrite: NEGATIVE
Protein, ur: NEGATIVE mg/dL
Specific Gravity, Urine: 1.018 (ref 1.005–1.030)
pH: 5 (ref 5.0–8.0)

## 2018-07-27 LAB — CBC WITH DIFFERENTIAL/PLATELET
Abs Immature Granulocytes: 0.02 10*3/uL (ref 0.00–0.07)
Basophils Absolute: 0.1 10*3/uL (ref 0.0–0.1)
Basophils Relative: 1 %
EOS ABS: 0.2 10*3/uL (ref 0.0–0.5)
Eosinophils Relative: 2 %
HCT: 41.8 % (ref 39.0–52.0)
Hemoglobin: 13.3 g/dL (ref 13.0–17.0)
Immature Granulocytes: 0 %
Lymphocytes Relative: 20 %
Lymphs Abs: 1.3 10*3/uL (ref 0.7–4.0)
MCH: 30.9 pg (ref 26.0–34.0)
MCHC: 31.8 g/dL (ref 30.0–36.0)
MCV: 97.2 fL (ref 80.0–100.0)
Monocytes Absolute: 0.5 10*3/uL (ref 0.1–1.0)
Monocytes Relative: 7 %
Neutro Abs: 4.5 10*3/uL (ref 1.7–7.7)
Neutrophils Relative %: 70 %
Platelets: 325 10*3/uL (ref 150–400)
RBC: 4.3 MIL/uL (ref 4.22–5.81)
RDW: 13.8 % (ref 11.5–15.5)
WBC: 6.5 10*3/uL (ref 4.0–10.5)
nRBC: 0 % (ref 0.0–0.2)

## 2018-07-27 LAB — LIPASE, BLOOD: Lipase: 34 U/L (ref 11–51)

## 2018-07-27 LAB — BILIRUBIN, FRACTIONATED(TOT/DIR/INDIR)
Bilirubin, Direct: 4.8 mg/dL — ABNORMAL HIGH (ref 0.0–0.2)
Indirect Bilirubin: 4 mg/dL — ABNORMAL HIGH (ref 0.3–0.9)
Total Bilirubin: 8.8 mg/dL — ABNORMAL HIGH (ref 0.3–1.2)

## 2018-07-27 LAB — GLUCOSE, CAPILLARY: GLUCOSE-CAPILLARY: 121 mg/dL — AB (ref 70–99)

## 2018-07-27 LAB — PROTIME-INR
INR: 1.22
INR: 1.14
Prothrombin Time: 15.3 seconds — ABNORMAL HIGH (ref 11.4–15.2)
Prothrombin Time: 14.5 seconds (ref 11.4–15.2)

## 2018-07-27 LAB — ACETAMINOPHEN LEVEL

## 2018-07-27 MED ORDER — LINAGLIPTIN 5 MG PO TABS
5.0000 mg | ORAL_TABLET | Freq: Every day | ORAL | Status: DC
  Filled 2018-07-27 (×3): qty 1

## 2018-07-27 MED ORDER — ONDANSETRON HCL 4 MG/2ML IJ SOLN: 4.0000 mg | Freq: Four times a day (QID) | INTRAMUSCULAR | Status: DC | PRN

## 2018-07-27 MED ORDER — INSULIN DETEMIR 100 UNIT/ML ~~LOC~~ SOLN
15.0000 [IU] | Freq: Every day | SUBCUTANEOUS | Status: DC
  Administered 2018-07-27 – 2018-07-30 (×4): 15 [IU] via SUBCUTANEOUS
  Filled 2018-07-27 (×5): qty 0.15

## 2018-07-27 MED ORDER — ONDANSETRON HCL 4 MG PO TABS: 4.0000 mg | ORAL_TABLET | Freq: Four times a day (QID) | ORAL | Status: DC | PRN

## 2018-07-27 MED ORDER — PANTOPRAZOLE SODIUM 40 MG PO TBEC
40.0000 mg | DELAYED_RELEASE_TABLET | Freq: Every day | ORAL | Status: DC
  Administered 2018-07-28 – 2018-07-31 (×3): 40 mg via ORAL
  Filled 2018-07-27 (×3): qty 1

## 2018-07-27 MED ORDER — INSULIN ASPART 100 UNIT/ML ~~LOC~~ SOLN
0.0000 [IU] | Freq: Three times a day (TID) | SUBCUTANEOUS | Status: DC
  Administered 2018-07-28: 1 [IU] via SUBCUTANEOUS
  Administered 2018-07-29: 3 [IU] via SUBCUTANEOUS
  Administered 2018-07-29: 2 [IU] via SUBCUTANEOUS
  Administered 2018-07-30 (×2): 3 [IU] via SUBCUTANEOUS
  Administered 2018-07-31: 2 [IU] via SUBCUTANEOUS

## 2018-07-27 MED ORDER — LACTATED RINGERS IV BOLUS
1000.0000 mL | Freq: Once | INTRAVENOUS | Status: AC
  Administered 2018-07-27: 1000 mL via INTRAVENOUS

## 2018-07-27 MED ORDER — ALBUTEROL SULFATE (2.5 MG/3ML) 0.083% IN NEBU: 3.0000 mL | INHALATION_SOLUTION | Freq: Four times a day (QID) | RESPIRATORY_TRACT | Status: DC | PRN

## 2018-07-27 MED ORDER — METOPROLOL SUCCINATE ER 50 MG PO TB24
50.0000 mg | ORAL_TABLET | Freq: Every day | ORAL | Status: DC
  Administered 2018-07-28 – 2018-07-31 (×3): 50 mg via ORAL
  Filled 2018-07-27 (×3): qty 1

## 2018-07-27 MED ORDER — LOSARTAN POTASSIUM 50 MG PO TABS
100.0000 mg | ORAL_TABLET | Freq: Every day | ORAL | Status: DC
  Administered 2018-07-28 – 2018-07-31 (×3): 100 mg via ORAL
  Filled 2018-07-27 (×3): qty 2

## 2018-07-27 MED ORDER — APIXABAN 5 MG PO TABS
5.0000 mg | ORAL_TABLET | Freq: Two times a day (BID) | ORAL | Status: DC
  Administered 2018-07-27 – 2018-07-28 (×2): 5 mg via ORAL
  Filled 2018-07-27 (×2): qty 1

## 2018-07-27 MED ORDER — IBUPROFEN 400 MG PO TABS
400.0000 mg | ORAL_TABLET | Freq: Four times a day (QID) | ORAL | Status: DC | PRN
  Administered 2018-07-28 – 2018-07-30 (×2): 400 mg via ORAL
  Filled 2018-07-27 (×2): qty 1

## 2018-07-27 MED ORDER — HYDROCHLOROTHIAZIDE 25 MG PO TABS
25.0000 mg | ORAL_TABLET | Freq: Every day | ORAL | Status: DC
  Administered 2018-07-28 – 2018-07-31 (×3): 25 mg via ORAL
  Filled 2018-07-27 (×3): qty 1

## 2018-07-27 MED ORDER — HYDROCODONE-ACETAMINOPHEN 5-325 MG PO TABS
1.0000 | ORAL_TABLET | Freq: Once | ORAL | Status: AC
  Administered 2018-07-27: 1 via ORAL
  Filled 2018-07-27: qty 1

## 2018-07-27 MED ORDER — AMLODIPINE BESYLATE 5 MG PO TABS
5.0000 mg | ORAL_TABLET | Freq: Every day | ORAL | Status: DC
  Administered 2018-07-28 – 2018-07-31 (×3): 5 mg via ORAL
  Filled 2018-07-27 (×3): qty 1

## 2018-07-27 NOTE — ED Triage Notes (Signed)
Pt here from home with c/o right side flank pain and uti type symptoms , freq, and some burning with urination

## 2018-07-27 NOTE — ED Notes (Signed)
Patient transported to CT 

## 2018-07-27 NOTE — ED Provider Notes (Signed)
[facility] EMERGENCY DEPARTMENT Provider Note   CSN: [id number] Arrival date & time: 07/27/18  1012     History   Chief Complaint Chief Complaint  Patient presents with  . Flank Pain  . Urinary Tract Infection    HPI [name] [name] is a 67 y.o. male.  The history is provided by the patient.  Abdominal Pain   This is a new problem. The current episode started more than 2 days ago. The problem occurs constantly. The problem has not changed since onset.The pain is associated with an unknown factor. Pain location: right flank. The quality of the pain is aching and dull. The pain is at a severity of 3/10. The pain is moderate. Associated symptoms include dysuria and arthralgias. Pertinent negatives include anorexia, fever, diarrhea, nausea, vomiting, constipation, hematuria and headaches. Nothing aggravates the symptoms. Nothing relieves the symptoms. Past workup comments: hx of kidney stones.    Past Medical History:  Diagnosis Date  . Allergy   . Asthma   . Atrial fibrillation ([place])    a. initially occuring in 2014, has declined anticoagulation since having a GI bleed with initiation of Xarelto  . Cardiomyopathy 2011   a. Patient was told that he had an ejection fraction of 37% by echo done elsewhere in 2011 b. 06/2014: echo showing preserved EF of 55-60%, severe asymetric septal hypertrophy with no SAM or LVOT gradient  . COPD (chronic obstructive pulmonary disease) ([place])   . Diabetes mellitus without complication ([place])   . Diverticulitis   . GERD (gastroesophageal reflux disease)   . History of colon polyps   . Hypertension   . Tobacco abuse     Patient Active Problem List   Diagnosis Date Noted  . Atrial flutter by electrocardiogram ([place]) 03/16/2018  . TIA (transient ischemic attack) 02/23/2018  . Overweight (BMI 25.0-29.9) 03/11/2016  . Allergic rhinitis due to pollen 09/04/2015  . Type 2 diabetes mellitus with diabetic nephropathy, with long-term  current use of insulin ([place]) 08/24/2015  . History of colonic polyps 12/29/2014  . 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 Tobacco abuse 06/10/2014  . BPH associated with nocturia 03/15/2014  . Routine general medical examination at a health care facility 11/05/2012  . Atrial fibrillation ([place]) 10/19/2012  . Hyperlipidemia with target LDL less than 100 05/05/2012  . GERD (gastroesophageal reflux disease)   . COPD (chronic obstructive pulmonary disease) ([place])   . Cardiomyopathy ([place])   . Essential hypertension, benign 06/26/2011    Past Surgical History:  Procedure Laterality Date  . COLON SURGERY  2009  . COLONOSCOPY          Home Medications    Prior to Admission medications   Medication Sig Start Date End Date Taking? Authorizing Provider  albuterol (PROVENTIL HFA;VENTOLIN HFA) 108 (90 Base) MCG/ACT inhaler Inhale 2 puffs into the lungs every 6 (six) hours as needed for wheezing. 09/23/17  Yes Janith Lima, MD  amLODipine (NORVASC) 5 MG tablet TAKE 1 TABLET BY MOUTH ONCE DAILY Patient taking differently: Take 5 mg by mouth daily.  07/15/18  Yes Strader, [place] M, PA-C  apixaban (ELIQUIS) 5 MG TABS tablet Take 1 tablet (5 mg total) by mouth 2 (two) times daily. 06/19/18  Yes Lelon Perla, MD  fish oil-omega-3 fatty acids 1000 MG capsule Take 2 g by mouth at bedtime.    Yes [provider]  hydrochlorothiazide (HYDRODIURIL) 25 MG tablet TAKE 1 TABLET BY MOUTH ONCE DAILY Patient taking differently: Take 25 mg  by mouth daily.  06/26/18  Yes Janith Lima, MD  Insulin Detemir (LEVEMIR FLEXPEN) 100 UNIT/ML Pen Inject 15 units once daily. Patient taking differently: Inject 15 Units into the skin at bedtime.  07/31/16  Yes Philemon Kingdom, MD  insulin regular (NOVOLIN R RELION) 100 units/mL injection INJECT 5-8 UNITS SUBCUTANEOUSLY THREE TIMES DAILY before meals Patient taking differently: Inject 5-8 Units into the skin 2 (two) times daily before a meal.  04/16/17  Yes  Philemon Kingdom, MD  linagliptin (TRADJENTA) 5 MG TABS tablet Take 1 tablet (5 mg total) by mouth daily. Patient taking differently: Take 5 mg by mouth at bedtime.  09/11/16  Yes Philemon Kingdom, MD  losartan (COZAAR) 100 MG tablet TAKE 1 TABLET BY MOUTH ONCE DAILY Patient taking differently: Take 100 mg by mouth daily.  06/30/18  Yes Strader, [place], PA-C  metFORMIN (GLUCOPHAGE) 500 MG tablet Take 1 tablet (500 mg total) by mouth 2 (two) times daily with a meal. 12/02/16  Yes Philemon Kingdom, MD  metoprolol succinate (TOPROL-XL) 50 MG 24 hr tablet TAKE 1 TABLET BY MOUTH ONCE DAILY WITH  OR  IMMEDIATELY  FOLLOWING  A  MEAL Patient taking differently: Take 50 mg by mouth daily.  07/20/18  Yes Lelon Perla, MD  Multiple Vitamin (MULTIVITAMIN WITH MINERALS) TABS tablet Take 1 tablet by mouth daily.   Yes [provider]  omeprazole (PRILOSEC) 20 MG capsule TAKE 1 CAPSULE BY MOUTH ONCE DAILY Patient taking differently: Take 20 mg by mouth daily.  03/16/18  Yes Janith Lima, MD  rosuvastatin (CRESTOR) 10 MG tablet Take 1 tablet (10 mg total) by mouth daily. 02/24/18  Yes Janith Lima, MD  diazepam (VALIUM) 5 MG tablet Take 1 tablet (5 mg total) by mouth every 6 (six) hours as needed for anxiety. Patient not taking: Reported on 07/27/2018 02/23/18   Janith Lima, MD  glucose blood (ONETOUCH VERIO) test strip USE AS INSTRUCTED THREE TIMES DAILY. DX CODE E11.21 01/12/18   Philemon Kingdom, MD  Glycopyrrolate-Formoterol (BEVESPI AEROSPHERE) 9-4.8 MCG/ACT AERO Inhale 2 Act 2 (two) times daily into the lungs. Patient not taking: Reported on 07/27/2018 06/12/17   Janith Lima, MD  Insulin Syringe-Needle U-100 (EASY TOUCH INSULIN SYRINGE) 31G X 5/16" 0.3 ML MISC USE AS DIRECTED 4 TIMES DAILY 05/01/16   Philemon Kingdom, MD  levocetirizine (XYZAL) 5 MG tablet Take 1 tablet (5 mg total) by mouth every evening. Patient not taking: Reported on 07/27/2018 02/23/18   Janith Lima, MD    montelukast (SINGULAIR) 10 MG tablet Take 1 tablet (10 mg total) by mouth at bedtime. Patient not taking: Reported on 07/27/2018 02/23/18   Janith Lima, MD  NOVOLIN R RELION 100 UNIT/ML injection INJECT 3 TO 6 UNITS SUBCUTANEOUSLY THREE TIMES DAILY Patient not taking: Reported on 07/27/2018 04/27/18   Philemon Kingdom, MD    Family History Family History  Problem Relation Age of Onset  . Prostate cancer Father   . Colon cancer Father   . Diabetes Father   . Arthritis Father   . Diabetes type II Mother   . Atrial fibrillation Mother   . Hypertension Mother   . Arthritis Mother   . Cancer Other        Colon,Breast, Prostate Cancer  . Hypertension Other   . Prostate cancer Brother        5 brothers - 2 were dx with prostate cancer  . Esophageal cancer Neg Hx   . Stomach cancer  Neg Hx   . Rectal cancer Neg Hx     Social History Social History   Tobacco Use  . Smoking status: Former Smoker    Types: Cigarettes    Last attempt to quit: 07/30/2011    Years since quitting: 6.9  . Smokeless tobacco: Never Used  . Tobacco comment: Smoked since age 74  Substance Use Topics  . Alcohol use: Yes    Alcohol/week: 1.0 standard drinks    Types: 1 Standard drinks or equivalent per week  . Drug use: No     Allergies   Hydralazine; Lisinopril; Penicillins; and Tribenzor [olmesartan-amlodipine-hctz]   Review of Systems Review of Systems  Constitutional: Negative for chills and fever.  HENT: Negative for ear pain and sore throat.   Eyes: Negative for pain and visual disturbance.  Respiratory: Negative for cough and shortness of breath.   Cardiovascular: Negative for chest pain and palpitations.  Gastrointestinal: Positive for abdominal pain. Negative for anorexia, constipation, diarrhea, nausea and vomiting.  Genitourinary: Positive for dysuria. Negative for hematuria.  Musculoskeletal: Positive for arthralgias. Negative for back pain.  Skin: Negative for color change and  rash.  Neurological: Negative for seizures, syncope and headaches.  All other systems reviewed and are negative.    Physical Exam Updated Vital Signs BP 135/83 (BP Location: Left Arm)   Pulse (!) 109   Temp 98.4 F (36.9 C) (Oral)   Resp 18   SpO2 96%   Physical Exam Vitals signs and nursing note reviewed.  Constitutional:      Appearance: He is well-developed.  HENT:     Head: Normocephalic and atraumatic.     Nose: Nose normal.     Mouth/Throat:     Mouth: Mucous membranes are moist.  Eyes:     Extraocular Movements: Extraocular movements intact.     Conjunctiva/sclera: Conjunctivae normal.     Pupils: Pupils are equal, round, and reactive to light.  Neck:     Musculoskeletal: Neck supple.  Cardiovascular:     Rate and Rhythm: Normal rate and regular rhythm.     Heart sounds: No murmur.  Pulmonary:     Effort: Pulmonary effort is normal. No respiratory distress.     Breath sounds: Normal breath sounds.  Abdominal:     General: There is no distension.     Palpations: Abdomen is soft.     Tenderness: There is no abdominal tenderness. There is right CVA tenderness. There is no guarding or rebound.  Skin:    General: Skin is warm and dry.     Capillary Refill: Capillary refill takes less than 2 seconds.  Neurological:     General: No focal deficit present.     Mental Status: He is alert.      ED Treatments / Results  Labs (all labs ordered are listed, but only abnormal results are displayed) Labs Reviewed  URINALYSIS, ROUTINE W REFLEX MICROSCOPIC - Abnormal; Notable for the following components:      Result Value   Color, Urine AMBER (*)    Bilirubin Urine MODERATE (*)    All other components within normal limits  COMPREHENSIVE METABOLIC PANEL - Abnormal; Notable for the following components:   Chloride 97 (*)    Glucose, Bld 119 (*)    Creatinine, Ser 1.39 (*)    Total Protein 8.3 (*)    AST 380 (*)    ALT 401 (*)    Alkaline Phosphatase 199 (*)     Total Bilirubin 9.8 (*)  GFR calc non Af Amer 51 (*)    GFR calc Af Amer 60 (*)    All other components within normal limits  ACETAMINOPHEN LEVEL - Abnormal; Notable for the following components:   Acetaminophen (Tylenol), Serum <10 (*)    All other components within normal limits  PROTIME-INR - Abnormal; Notable for the following components:   Prothrombin Time 15.3 (*)    All other components within normal limits  COMPREHENSIVE METABOLIC PANEL - Abnormal; Notable for the following components:   Glucose, Bld 132 (*)    Creatinine, Ser 1.40 (*)    AST 351 (*)    ALT 378 (*)    Alkaline Phosphatase 178 (*)    Total Bilirubin 8.9 (*)    GFR calc non Af Amer 51 (*)    GFR calc Af Amer 59 (*)    All other components within normal limits  CBC WITH DIFFERENTIAL/PLATELET  LIPASE, BLOOD  PROTIME-INR  HEPATITIS PANEL, ACUTE  ANTINUCLEAR ANTIBODIES, IFA    EKG None  Radiology Ct Renal Stone Study  Result Date: 07/27/2018 CLINICAL DATA:  Right flank pain and discolored urine for 4-5 days. EXAM: CT ABDOMEN AND PELVIS WITHOUT CONTRAST TECHNIQUE: Multidetector CT imaging of the abdomen and pelvis was performed following the standard protocol without IV contrast. COMPARISON:  None. FINDINGS: Lower chest: There is cardiomegaly. No pleural or pericardial effusion. Lung bases are clear. Hepatobiliary: No focal liver abnormality is seen. No gallstones, gallbladder wall thickening, or biliary dilatation. Pancreas: Unremarkable. No pancreatic ductal dilatation or surrounding inflammatory changes. Spleen: Normal in size without focal abnormality. Adrenals/Urinary Tract: Adrenal glands are unremarkable. Kidneys are normal, without renal calculi, focal solid lesion, or hydronephrosis. Small right renal cyst is noted. Bladder is unremarkable. Stomach/Bowel: Surgical anastomosis at the junction of the descending and sigmoid colon noted. Scattered diverticulosis is seen throughout the colon but there is no  evidence of diverticulitis. The appendix is not visualized and may have been removed. Small hiatal hernia is noted. The stomach is otherwise unremarkable. Small bowel loops appear normal. Vascular/Lymphatic: Aortic atherosclerosis. No enlarged abdominal or pelvic lymph nodes. Reproductive: Prostate gland is enlarged. Other: Small fat containing supraumbilical hernia is identified. Musculoskeletal: No fracture or worrisome lesion. Facet degenerative disease results in 0.7 cm anterolisthesis L5 on S1. IMPRESSION: Negative for urinary tract stone. No acute abnormality abdomen or pelvis. Cardiomegaly. Atherosclerosis. Diverticulosis without diverticulitis. Fat containing supraumbilical hernia. Lower lumbar spondylosis. Electronically Signed   By: Inge Rise M.D.   On: 07/27/2018 12:39   Vas US Aorta/ivc/iliacs  Result Date: 07/27/2018 ABDOMINAL AORTA STUDY Indications: Patient c/o right flank discomfort. Risk Factors: Hypertension, hyperlipidemia, Diabetes, past history of smoking. Limitations: Air/bowel gas.  Comparison Study: 06/28/14 at [facility], visceral duplex showed no AAA, but did show                   increased left common iliac artery size at 1.5 cm x 1.6 cm,                   and elevated velocity of 229 cm/sec. Performing Technologist: [employer] BS, RVT, RDCS  Examination Guidelines: A complete evaluation includes B-mode imaging, spectral Doppler, color Doppler, and power Doppler as needed of all accessible portions of each vessel. Bilateral testing is considered an integral part of a complete examination. Limited examinations for reoccurring indications may be performed as noted.  Abdominal Aorta Findings: +-------------+-------+----------+----------+--------+--------+--------+ Location     AP (cm)Trans (cm)PSV (cm/s)WaveformThrombusComments +-------------+-------+----------+----------+--------+--------+--------+ Proximal  2.10   2.20      110                                 +-------------+-------+----------+----------+--------+--------+--------+ Mid          1.90   1.90      67                                 +-------------+-------+----------+----------+--------+--------+--------+ Distal       1.80   2.00      43                                 +-------------+-------+----------+----------+--------+--------+--------+ RT CIA Prox  1.6    1.7       101                                +-------------+-------+----------+----------+--------+--------+--------+ RT CIA Mid                    76                                 +-------------+-------+----------+----------+--------+--------+--------+ RT CIA Distal                 98                                 +-------------+-------+----------+----------+--------+--------+--------+ RT EIA Prox  1.0    1.1       104                                +-------------+-------+----------+----------+--------+--------+--------+ RT EIA Mid                    77                                 +-------------+-------+----------+----------+--------+--------+--------+ RT EIA Distal                 72                                 +-------------+-------+----------+----------+--------+--------+--------+ LT CIA Prox  1.6    1.6       153                                +-------------+-------+----------+----------+--------+--------+--------+ LT CIA Mid                    219                                +-------------+-------+----------+----------+--------+--------+--------+ LT EIA Prox  1.1    1.1       74                                 +-------------+-------+----------+----------+--------+--------+--------+  LT EIA Mid                    87                                 +-------------+-------+----------+----------+--------+--------+--------+ LT EIA Distal                 73                                  +-------------+-------+----------+----------+--------+--------+--------+  Visualization of the Left CIA Distal artery was limited. Limited imaging of the right flank reveals an enlarged gall bladder, measuring 12 cm in length and 4.5 cm in width (normal values are respectively 7-10 cm long, and 2.8-3.9 wide). IVC/Iliac Findings: +--------+------+--------+--------+   IVC   PatentThrombusComments +--------+------+--------+--------+ IVC Proxpatent                 +--------+------+--------+--------+  Summary: Abdominal Aorta: No evidence of an abdominal aortic aneurysm was visualized. 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 The largest aortic measurement is 2.2 cm. 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  *See table(s) above for measurements and observations. Suggest follow up study in 12 months.     Preliminary     Procedures Procedures (including critical care time)  Medications Ordered in ED Medications  lactated ringers bolus 1,000 mL (0 mLs Intravenous Stopped 07/27/18 1302)  HYDROcodone-acetaminophen (NORCO/VICODIN) 5-325 MG per tablet 1 tablet (1 tablet Oral Given 07/27/18 1143)     Initial Impression / Assessment and Plan / ED Course  I have reviewed the triage vital signs and the nursing notes.  Pertinent labs & imaging results that were available during my care of the patient were reviewed by me and considered in my medical decision making (see chart for details).     [name] [name] is a 67 year old male with history of reflux, COPD, hypertension, atrial fibrillation on Eliquis who presents the ED with right flank pain.  Patient with normal vitals.  No fever.   Patient with pain for the last several days that is dull and aching.  Has history of kidney stones.  States that his color of his urine is darker than normal.  Pain is some mild right CVA tenderness on exam.  Patient with otherwise unremarkable exam.  Patient concern for possible kidney stone, urinary tract infection.  Will obtain lab work including CT of abdomen and pelvis.  Patient given fluid bolus, Norco for pain.  Patient with overall unremarkable CT scan.  Patient however with elevated AST, ALT, bilirubin.  Liver function both around 400.  Alk phos elevated as well.  Patient with bilirubin of 9.8.  No signs of urinary tract infection.  CT scan did not show any abnormalities of the liver or gallbladder.  Lipase within normal limits.  Patient denies any alcohol, Tylenol abuse.  No recent travel.  He has had some upper respiratory symptoms for the last several weeks but denies any diarrhea, vomiting, nausea.  Contacted gastroenterology and they recommend adding on Tylenol level, INR, right upper quadrant ultrasound, ANA, hepatitis panel.  Hospitalist would like repeat labs to ensure accuracy.  Repeat  CMP and INR were about the same level.  Will admit the patient to trend INR, liver function panel.  GI was consulted and they are in agreement with current lab work-up and imaging.  They will see the patient in consultation with the hospitalist to admit the patient for further work-up.  Patient pending right upper quadrant ultrasound, hepatitis panel pending.  Tylenol level normal. Possibly medication related?.  This chart was dictated using voice recognition software.  Despite best efforts to proofread,  errors can occur which can change the documentation meaning.  Final Clinical Impressions(s) / ED Diagnoses   Final diagnoses:  Elevated LFTs  Acute hepatitis  Elevated bilirubin    ED Discharge Orders    None       Lennice Sites, DO 07/27/18 1811

## 2018-07-27 NOTE — Plan of Care (Signed)

## 2018-07-27 NOTE — H&P (Addendum)
History and Physical    [name] [name] KNL:[id number] DOB: 07-17-1951 DOA: 07/27/2018  PCP: Janith Lima, MD Patient coming from: Home  Chief Complaint: Right flank pain  HPI: [name] [name] is a 67 y.o. male with medical history significant of diabetes mellitus type 2, hypertension, atrial fibrillation on anticoagulation, hyperlipidemia, COPD, asthma, diverticulitis.  Patient states that over the last couple weeks he has had right, throbbing, flank pain that reminded him of when he had kidney stone about 1 year ago.  His pain continues to worsen.  He has had trouble laying on his right side.  He recently noticed that his urine turned from straw-colored dark decided to seek more medical attention.  ED Course: Vitals: Afebrile, slight tachycardia with a pulse of 109, normal respirations, BP 135/83, on room air Labs: Glucose of 132, creatinine of 1.4, alkaline phosphatase of 178, AST/ALT of 351/378 respectively, total bilirubin of 9.8 > 8.9 Imaging: CT renal stone study significant for diverticulosis, cardiomegaly, no cholelithiasis or biliary dilatation, no kidney stone Medications/Course: 1L LR bolus  Review of Systems: Review of Systems  Genitourinary: Positive for flank pain. Negative for dysuria.  Musculoskeletal: Negative for myalgias.  All other systems reviewed and are negative.   Past Medical History:  Diagnosis Date  . Allergy   . Asthma   . Atrial fibrillation ([place])    a. initially occuring in 2014, has declined anticoagulation since having a GI bleed with initiation of Xarelto  . Cardiomyopathy 2011   a. Patient was told that he had an ejection fraction of 37% by echo done elsewhere in 2011 b. 06/2014: echo showing preserved EF of 55-60%, severe asymetric septal hypertrophy with no SAM or LVOT gradient  . COPD (chronic obstructive pulmonary disease) ([place])   . Diabetes mellitus without complication ([place])   . Diverticulitis   . GERD (gastroesophageal reflux disease)     . History of colon polyps   . Hypertension   . Tobacco abuse     Past Surgical History:  Procedure Laterality Date  . COLON SURGERY  2009  . COLONOSCOPY       reports that he quit smoking about 6 years ago. His smoking use included cigarettes. He has never used smokeless tobacco. He reports current alcohol use of about 1.0 standard drinks of alcohol per week. He reports that he does not use drugs.  Allergies  Allergen Reactions  . Hydralazine Shortness Of Breath    Heart palpitations  . Lisinopril Cough  . Penicillins Other (See Comments)    [place] DID THE REACTION INVOLVE: Swelling of the face/tongue/throat, SOB, or low BP? No Yes Sudden or severe rash/hives, skin peeling, or the inside of the mouth or nose? No Did it require medical treatment? No When did it last happen?teens  If all above answers are "NO", may proceed with cephalosporin use.  Marland Kitchen Tribenzor [Olmesartan-Amlodipine-Hctz] Nausea Only    dizziness    Family History  Problem Relation Age of Onset  . Prostate cancer Father   . Colon cancer Father   . Diabetes Father   . Arthritis Father   . Diabetes type II Mother   . Atrial fibrillation Mother   . Hypertension Mother   . Arthritis Mother   . Cancer Other        Colon,Breast, Prostate Cancer  . Hypertension Other   . Prostate cancer Brother        5 brothers - 2 were dx with prostate cancer  . Esophageal cancer Neg Hx   .  Stomach cancer Neg Hx   . Rectal cancer Neg Hx     Prior to Admission medications   Medication Sig Start Date End Date Taking? Authorizing Provider  albuterol (PROVENTIL HFA;VENTOLIN HFA) 108 (90 Base) MCG/ACT inhaler Inhale 2 puffs into the lungs every 6 (six) hours as needed for wheezing. 09/23/17  Yes Janith Lima, MD  amLODipine (NORVASC) 5 MG tablet TAKE 1 TABLET BY MOUTH ONCE DAILY Patient taking differently: Take 5 mg by mouth daily.  07/15/18  Yes Strader, [place] M, PA-C  apixaban (ELIQUIS) 5 MG TABS tablet Take 1  tablet (5 mg total) by mouth 2 (two) times daily. 06/19/18  Yes Lelon Perla, MD  fish oil-omega-3 fatty acids 1000 MG capsule Take 2 g by mouth at bedtime.    Yes [provider]  hydrochlorothiazide (HYDRODIURIL) 25 MG tablet TAKE 1 TABLET BY MOUTH ONCE DAILY Patient taking differently: Take 25 mg by mouth daily.  06/26/18  Yes Janith Lima, MD  Insulin Detemir (LEVEMIR FLEXPEN) 100 UNIT/ML Pen Inject 15 units once daily. Patient taking differently: Inject 15 Units into the skin at bedtime.  07/31/16  Yes Philemon Kingdom, MD  insulin regular (NOVOLIN R RELION) 100 units/mL injection INJECT 5-8 UNITS SUBCUTANEOUSLY THREE TIMES DAILY before meals Patient taking differently: Inject 5-8 Units into the skin 2 (two) times daily before a meal.  04/16/17  Yes Philemon Kingdom, MD  linagliptin (TRADJENTA) 5 MG TABS tablet Take 1 tablet (5 mg total) by mouth daily. Patient taking differently: Take 5 mg by mouth at bedtime.  09/11/16  Yes Philemon Kingdom, MD  losartan (COZAAR) 100 MG tablet TAKE 1 TABLET BY MOUTH ONCE DAILY Patient taking differently: Take 100 mg by mouth daily.  06/30/18  Yes Strader, [place], PA-C  metFORMIN (GLUCOPHAGE) 500 MG tablet Take 1 tablet (500 mg total) by mouth 2 (two) times daily with a meal. 12/02/16  Yes Philemon Kingdom, MD  metoprolol succinate (TOPROL-XL) 50 MG 24 hr tablet TAKE 1 TABLET BY MOUTH ONCE DAILY WITH  OR  IMMEDIATELY  FOLLOWING  A  MEAL Patient taking differently: Take 50 mg by mouth daily.  07/20/18  Yes Lelon Perla, MD  Multiple Vitamin (MULTIVITAMIN WITH MINERALS) TABS tablet Take 1 tablet by mouth daily.   Yes [provider]  omeprazole (PRILOSEC) 20 MG capsule TAKE 1 CAPSULE BY MOUTH ONCE DAILY Patient taking differently: Take 20 mg by mouth daily.  03/16/18  Yes Janith Lima, MD  diazepam (VALIUM) 5 MG tablet Take 1 tablet (5 mg total) by mouth every 6 (six) hours as needed for anxiety. Patient not taking: Reported  on 07/27/2018 02/23/18   Janith Lima, MD  glucose blood (ONETOUCH VERIO) test strip USE AS INSTRUCTED THREE TIMES DAILY. DX CODE E11.21 01/12/18   Philemon Kingdom, MD  Glycopyrrolate-Formoterol (BEVESPI AEROSPHERE) 9-4.8 MCG/ACT AERO Inhale 2 Act 2 (two) times daily into the lungs. Patient not taking: Reported on 07/27/2018 06/12/17   Janith Lima, MD  Insulin Syringe-Needle U-100 (EASY TOUCH INSULIN SYRINGE) 31G X 5/16" 0.3 ML MISC USE AS DIRECTED 4 TIMES DAILY 05/01/16   Philemon Kingdom, MD  levocetirizine (XYZAL) 5 MG tablet Take 1 tablet (5 mg total) by mouth every evening. Patient not taking: Reported on 07/27/2018 02/23/18   Janith Lima, MD  montelukast (SINGULAIR) 10 MG tablet Take 1 tablet (10 mg total) by mouth at bedtime. Patient not taking: Reported on 07/27/2018 02/23/18   Janith Lima, MD  Helmut Muster  R RELION 100 UNIT/ML injection INJECT 3 TO 6 UNITS SUBCUTANEOUSLY THREE TIMES DAILY Patient not taking: Reported on 07/27/2018 04/27/18   Philemon Kingdom, MD    Physical Exam:  Physical Exam Vitals signs and nursing note reviewed.  Constitutional:      General: He is not in acute distress.    Appearance: He is well-developed. He is not diaphoretic.  HENT:     Mouth/Throat:     Dentition: Abnormal dentition.     Pharynx: Oropharynx is clear.  Eyes:     General: Scleral icterus present.     Conjunctiva/sclera: Conjunctivae normal.     Pupils: Pupils are equal, round, and reactive to light.  Neck:     Musculoskeletal: Full passive range of motion without pain, normal range of motion and neck supple.  Cardiovascular:     Rate and Rhythm: Normal rate and regular rhythm.  Pulmonary:     Effort: Pulmonary effort is normal. No respiratory distress.     Breath sounds: Normal breath sounds. No wheezing or rales.  Abdominal:     General: Bowel sounds are normal. There is distension.     Palpations: Abdomen is soft. There is no hepatomegaly, splenomegaly or mass.      Tenderness: There is no abdominal tenderness. There is no right CVA tenderness, left CVA tenderness, guarding or rebound.  Musculoskeletal: Normal range of motion.        General: No tenderness.     Right lower leg: No edema.     Left lower leg: No edema.  Lymphadenopathy:     Cervical: No cervical adenopathy.  Skin:    General: Skin is warm and dry.  Neurological:     Mental Status: He is alert and oriented to person, place, and time.  Psychiatric:        Attention and Perception: Attention normal.        Mood and Affect: Mood and affect normal.        Speech: Speech normal.      Labs on Admission: I have personally reviewed following labs and imaging studies  CBC: Recent Labs  Lab 07/27/18 1141  WBC 6.5  NEUTROABS 4.5  HGB 13.3  HCT 41.8  MCV 97.2  PLT 875    Basic Metabolic Panel: Recent Labs  Lab 07/27/18 1141 07/27/18 1554  NA 137 138  K 3.6 3.8  CL 97* 100  CO2 28 28  GLUCOSE 119* 132*  BUN 14 14  CREATININE 1.39* 1.40*  CALCIUM 9.5 9.1    GFR: CrCl cannot be calculated (Unknown ideal weight.).  Liver Function Tests: Recent Labs  Lab 07/27/18 1141 07/27/18 1554  AST 380* 351*  ALT 401* 378*  ALKPHOS 199* 178*  BILITOT 9.8* 8.9*  PROT 8.3* 7.6  ALBUMIN 3.8 3.6   Recent Labs  Lab 07/27/18 1141  LIPASE 34   No results for input(s): AMMONIA in the last 168 hours.  Coagulation Profile: Recent Labs  Lab 07/27/18 1301 07/27/18 1554  INR 1.22 1.14    Cardiac Enzymes: No results for input(s): CKTOTAL, CKMB, CKMBINDEX, TROPONINI in the last 168 hours.  BNP (last 3 results) No results for input(s): PROBNP in the last 8760 hours.  HbA1C: No results for input(s): HGBA1C in the last 72 hours.  CBG: No results for input(s): GLUCAP in the last 168 hours.  Lipid Profile: No results for input(s): CHOL, HDL, LDLCALC, TRIG, CHOLHDL, LDLDIRECT in the last 72 hours.  Thyroid Function Tests: No results for input(s): TSH,  T4TOTAL, FREET4,  T3FREE, THYROIDAB in the last 72 hours.  Anemia Panel: No results for input(s): VITAMINB12, FOLATE, FERRITIN, TIBC, IRON, RETICCTPCT in the last 72 hours.  Urine analysis:    Component Value Date/Time   COLORURINE AMBER (A) 07/27/2018 1126   APPEARANCEUR CLEAR 07/27/2018 1126   LABSPEC 1.018 07/27/2018 1126   PHURINE 5.0 07/27/2018 1126   GLUCOSEU NEGATIVE 07/27/2018 [place] 04/03/2017 0841   HGBUR NEGATIVE 07/27/2018 1126   BILIRUBINUR MODERATE (A) 07/27/2018 1126   KETONESUR NEGATIVE 07/27/2018 1126   PROTEINUR NEGATIVE 07/27/2018 1126   UROBILINOGEN 0.2 04/03/2017 0841   NITRITE NEGATIVE 07/27/2018 1126   LEUKOCYTESUR NEGATIVE 07/27/2018 1126     Radiological Exams on Admission: Ct Renal Stone Study  Result Date: 07/27/2018 CLINICAL DATA:  Right flank pain and discolored urine for 4-5 days. EXAM: CT ABDOMEN AND PELVIS WITHOUT CONTRAST TECHNIQUE: Multidetector CT imaging of the abdomen and pelvis was performed following the standard protocol without IV contrast. COMPARISON:  None. FINDINGS: Lower chest: There is cardiomegaly. No pleural or pericardial effusion. Lung bases are clear. Hepatobiliary: No focal liver abnormality is seen. No gallstones, gallbladder wall thickening, or biliary dilatation. Pancreas: Unremarkable. No pancreatic ductal dilatation or surrounding inflammatory changes. Spleen: Normal in size without focal abnormality. Adrenals/Urinary Tract: Adrenal glands are unremarkable. Kidneys are normal, without renal calculi, focal solid lesion, or hydronephrosis. Small right renal cyst is noted. Bladder is unremarkable. Stomach/Bowel: Surgical anastomosis at the junction of the descending and sigmoid colon noted. Scattered diverticulosis is seen throughout the colon but there is no evidence of diverticulitis. The appendix is not visualized and may have been removed. Small hiatal hernia is noted. The stomach is otherwise unremarkable. Small bowel loops appear  normal. Vascular/Lymphatic: Aortic atherosclerosis. No enlarged abdominal or pelvic lymph nodes. Reproductive: Prostate gland is enlarged. Other: Small fat containing supraumbilical hernia is identified. Musculoskeletal: No fracture or worrisome lesion. Facet degenerative disease results in 0.7 cm anterolisthesis L5 on S1. IMPRESSION: Negative for urinary tract stone. No acute abnormality abdomen or pelvis. Cardiomegaly. Atherosclerosis. Diverticulosis without diverticulitis. Fat containing supraumbilical hernia. Lower lumbar spondylosis. Electronically Signed   By: Inge Rise M.D.   On: 07/27/2018 12:39   Vas US Aorta/ivc/iliacs  Result Date: 07/27/2018 ABDOMINAL AORTA STUDY Indications: Patient c/o right flank discomfort. Risk Factors: Hypertension, hyperlipidemia, Diabetes, past history of smoking. Limitations: Air/bowel gas.  Comparison Study: 06/28/14 at [facility], visceral duplex showed no AAA, but did show                   increased left common iliac artery size at 1.5 cm x 1.6 cm,                   and elevated velocity of 229 cm/sec. Performing Technologist: [employer] BS, RVT, RDCS  Examination Guidelines: A complete evaluation includes B-mode imaging, spectral Doppler, color Doppler, and power Doppler as needed of all accessible portions of each vessel. Bilateral testing is considered an integral part of a complete examination. Limited examinations for reoccurring indications may be performed as noted.  Abdominal Aorta Findings: +-------------+-------+----------+----------+--------+--------+--------+ Location     AP (cm)Trans (cm)PSV (cm/s)WaveformThrombusComments +-------------+-------+----------+----------+--------+--------+--------+ Proximal     2.10   2.20      110                                +-------------+-------+----------+----------+--------+--------+--------+ Mid  1.90   1.90      67                                  +-------------+-------+----------+----------+--------+--------+--------+ Distal       1.80   2.00      43                                 +-------------+-------+----------+----------+--------+--------+--------+ RT CIA Prox  1.6    1.7       101                                +-------------+-------+----------+----------+--------+--------+--------+ RT CIA Mid                    76                                 +-------------+-------+----------+----------+--------+--------+--------+ RT CIA Distal                 98                                 +-------------+-------+----------+----------+--------+--------+--------+ RT EIA Prox  1.0    1.1       104                                +-------------+-------+----------+----------+--------+--------+--------+ RT EIA Mid                    77                                 +-------------+-------+----------+----------+--------+--------+--------+ RT EIA Distal                 72                                 +-------------+-------+----------+----------+--------+--------+--------+ LT CIA Prox  1.6    1.6       153                                +-------------+-------+----------+----------+--------+--------+--------+ LT CIA Mid                    219                                +-------------+-------+----------+----------+--------+--------+--------+ LT EIA Prox  1.1    1.1       74                                 +-------------+-------+----------+----------+--------+--------+--------+ LT EIA Mid                    87                                 +-------------+-------+----------+----------+--------+--------+--------+  LT EIA Distal                 73                                 +-------------+-------+----------+----------+--------+--------+--------+  Visualization of the Left CIA Distal artery was limited. Limited imaging of the right flank reveals an enlarged gall  bladder, measuring 12 cm in length and 4.5 cm in width (normal values are respectively 7-10 cm long, and 2.8-3.9 wide). IVC/Iliac Findings: +--------+------+--------+--------+   IVC   PatentThrombusComments +--------+------+--------+--------+ IVC Proxpatent                 +--------+------+--------+--------+  Summary: Abdominal Aorta: No evidence of an abdominal aortic aneurysm was visualized. 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 The largest aortic measurement is 2.2 cm. 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  *See table(s) above for measurements and observations. Suggest follow up study in 12 months.     Preliminary     Assessment/Plan Active Problems:   Essential hypertension, benign   GERD (gastroesophageal reflux disease)   Hyperlipidemia with target LDL less than 100   Atrial fibrillation (HCC)   Elevated LFTs   Elevated LFTs Elevated bilirubin Evidence of hepatitis.  Unsure if this is acute or subacute. ? Passed gallstone, although no evidence of cholelithiasis on CT scan INR is also elevated. Possibly related to statin as this was started in July 3 times per week and increased to daily dosing in September. Low-power gastroenterology consult and recommended hepatitis panel, right upper quadrant ultrasound, Tylenol level, ANA -Gastroenterology recommendations pending in the morning -Repeat INR and CMP in a.m. -RUQ [place] pending -Fractionated bilirubin  Right flank pain Likely secondary to hepatitis.  CT scan of abdomen pelvis was largely unremarkable for an etiology.  Atrial fibrillation Currently rate controlled. On metoprolol for rate control and recently  started on Eliquis. -Continue Eliquis. May need to stop if INR rises -Continue metoprolol  Essential hypertension Normotensive -Continue home amlodipine, HCTZ, losartan, Metoprolol  Diabetes mellitus type 2 Last hemoglobin A1c of 6.1% in October 2019.  Currently on Levemir, Novolin R, Tradjenta, metformin as an outpatient.  Follows with endocrinology. -Continue home Levemir, Tradjenta -SSI moderate qAC  GERD Prilosec as an outpatient -Continue PPI (Protonix)  Asthma Stable. -Continue albuterol prn  Hyperlipidemia Currently on Crestor as an outpatient. -Discontinue Crestor in setting of elevated LFTs  CKD stage II Baseline creatinine of 1.2-1.3. stable. Has recently had acute kidney injury, but baseline appears to be in the CKD II range.  History of possible TIA MRI ordered but never obtained. Was placed on statin at that time.   DVT prophylaxis: Eliquis Code Status: Full code Family Communication: None at bedside Disposition Plan: Medical floor. Likely discharge home tomorrow if INR and pending gastroenterology recommendations Consults called: [place] [facility] Admission status: Observation   Cordelia Poche, MD [facility] 07/27/2018, 7:11 PM  If 7PM-7AM, please contact night-coverage [email or web address] Password [email or web address]

## 2018-07-28 ENCOUNTER — Encounter (HOSPITAL_COMMUNITY): Payer: Self-pay

## 2018-07-28 ENCOUNTER — Observation Stay (HOSPITAL_COMMUNITY): Payer: Medicare Other

## 2018-07-28 DIAGNOSIS — R198 Other specified symptoms and signs involving the digestive system and abdomen: Secondary | ICD-10-CM

## 2018-07-28 DIAGNOSIS — R17 Unspecified jaundice: Secondary | ICD-10-CM

## 2018-07-28 DIAGNOSIS — R945 Abnormal results of liver function studies: Secondary | ICD-10-CM

## 2018-07-28 DIAGNOSIS — I10 Essential (primary) hypertension: Secondary | ICD-10-CM | POA: Diagnosis not present

## 2018-07-28 DIAGNOSIS — I480 Paroxysmal atrial fibrillation: Secondary | ICD-10-CM | POA: Diagnosis not present

## 2018-07-28 DIAGNOSIS — E785 Hyperlipidemia, unspecified: Secondary | ICD-10-CM | POA: Diagnosis not present

## 2018-07-28 LAB — COMPREHENSIVE METABOLIC PANEL
ALT: 417 U/L — ABNORMAL HIGH (ref 0–44)
ANION GAP: 12 (ref 5–15)
AST: 379 U/L — ABNORMAL HIGH (ref 15–41)
Albumin: 3.4 g/dL — ABNORMAL LOW (ref 3.5–5.0)
Alkaline Phosphatase: 172 U/L — ABNORMAL HIGH (ref 38–126)
BUN: 13 mg/dL (ref 8–23)
CO2: 27 mmol/L (ref 22–32)
Calcium: 9 mg/dL (ref 8.9–10.3)
Chloride: 101 mmol/L (ref 98–111)
Creatinine, Ser: 1.43 mg/dL — ABNORMAL HIGH (ref 0.61–1.24)
GFR calc Af Amer: 58 mL/min — ABNORMAL LOW (ref >60)
GFR, EST NON AFRICAN AMERICAN: 50 mL/min — AB (ref >60)
Glucose, Bld: 114 mg/dL — ABNORMAL HIGH (ref 70–99)
Potassium: 3.3 mmol/L — ABNORMAL LOW (ref 3.5–5.1)
Sodium: 140 mmol/L (ref 135–145)
Total Bilirubin: 9.1 mg/dL — ABNORMAL HIGH (ref 0.3–1.2)
Total Protein: 7.1 g/dL (ref 6.5–8.1)

## 2018-07-28 LAB — GLUCOSE, CAPILLARY
Glucose-Capillary: 126 mg/dL — ABNORMAL HIGH (ref 70–99)
Glucose-Capillary: 122 mg/dL — ABNORMAL HIGH (ref 70–99)
Glucose-Capillary: 110 mg/dL — ABNORMAL HIGH (ref 70–99)
Glucose-Capillary: 159 mg/dL — ABNORMAL HIGH (ref 70–99)

## 2018-07-28 LAB — HEPATITIS PANEL, ACUTE
HCV Ab: <0.1 s/co ratio (ref 0.0–0.9)
Hep A IgM: NEGATIVE
Hep B C IgM: NEGATIVE
Hepatitis B Surface Ag: NEGATIVE

## 2018-07-28 LAB — ANTINUCLEAR ANTIBODIES, IFA: ANA Ab, IFA: NEGATIVE

## 2018-07-28 LAB — HIV ANTIBODY (ROUTINE TESTING W REFLEX): HIV Screen 4th Generation wRfx: NONREACTIVE

## 2018-07-28 LAB — PROTIME-INR
INR: 1.16
Prothrombin Time: 14.7 seconds (ref 11.4–15.2)

## 2018-07-28 MED ORDER — DIAZEPAM 5 MG PO TABS
5.0000 mg | ORAL_TABLET | ORAL | Status: AC
  Administered 2018-07-28: 5 mg via ORAL
  Filled 2018-07-28: qty 1

## 2018-07-28 MED ORDER — POTASSIUM CHLORIDE CRYS ER 20 MEQ PO TBCR
40.0000 meq | EXTENDED_RELEASE_TABLET | Freq: Once | ORAL | Status: AC
  Administered 2018-07-28: 40 meq via ORAL
  Filled 2018-07-28: qty 2

## 2018-07-28 NOTE — H&P (View-Only) (Signed)
Referring Provider: [facility]  Primary Care Physician:  Janith Lima, MD Primary Gastroenterologist:  None. Was scheduled to see Dr. Carlean Purl in 2018 but had to cancel.      Reason for Consultation:   Abnormal liver tests    ASSESSMENT / PLAN:    [birth year]. 67 year old male with 3-day history of remittent right mid back / right flank and RUQ pain in setting of markedly abnormal liver tests (mixed pattern).  Ultrasound confirms gallbladder sludge, iintrahepatic and extrahepatic biliary duct dilation.   -With the pain, u/s findings and abnormal liver tests one could almost make an argument for ERCP. However, his indirect and direct bilirubin are both elevated to about the same degree. Given this will first obtain MRCP / MRI.  Patient says he has claustrophobia and has had difficult time bleeding MRI in the past.  Will try dose of Valium 30 minutes prior to the exam -check am labs  2. AFIB on Eliquis.  -I will talk with Hospitalist about holding Eliquis in anticipation of an ERCP   HPI:      HPI: [name] [name] is a 67 y.o. male with GERD, DM2, HTN, CKD 2, AFIB on anticoagulation, COPD. He presented to ED last night with right flank pain reminiscent of a kidney stone a year ago. Non-contrast CTAP scan unremarkable for etiology but liver tests found to be markedly elevated.  Data reviewed:  Alk phos 199 AST 380 ALT 401 Tbili 9.8 Lipase normal. WBC 6.5 hgb 13.3  INR 1.16 RUQ u/s -Moderate sludge in the gallbladder without sonographic evidence for an acute cholecystitis. Mild intra hepatic biliary dilatation with enlarged extrahepatic common bile duct up to 1 cm. Further evaluation with MRCP as clinically indicated. Echogenic liver suggesting steatosis and or hepatocellular Disease.  Repeat liver tests last night - tbili 8.8, indirect bili 4.0 / direct 4.8.  Today's labs - tbili 9.1, transaminases and alk phos about the same  A few days ago patient developed pain in right mid  back/right flank area.  He also noticed some pain in his RUQ but it was not as severe as the pain in the flank and back.  Patient initially thought he had another kidney stone.  The pain was intermittent, burning in nature.  He felt better when lying on his right side. Interestingly, eating seem to make the pain better.  Initially he had some associated nausea but that dissipated quickly.  Patient noticed that his urine was darker than normal.  He also reports  light-colored stools over the last 3 days but bowel movements are otherwise normal.  No other GI complaints.  Patient has not had any blood in bowel movements or unexpected weight loss.    Past Medical History:  Diagnosis Date  . Allergy   . Asthma   . Atrial fibrillation ([place])    a. initially occuring in 2014, has declined anticoagulation since having a GI bleed with initiation of Xarelto  . Cardiomyopathy 2011   a. Patient was told that he had an ejection fraction of 37% by echo done elsewhere in 2011 b. 06/2014: echo showing preserved EF of 55-60%, severe asymetric septal hypertrophy with no SAM or LVOT gradient  . COPD (chronic obstructive pulmonary disease) ([place])   . Diabetes mellitus without complication ([place])   . Diverticulitis   . GERD (gastroesophageal reflux disease)   . History of colon polyps   . Hypertension   . Tobacco abuse     Past Surgical History:  Procedure Laterality  Date  . COLON SURGERY  2009  . COLONOSCOPY      Prior to Admission medications   Medication Sig Start Date End Date Taking? Authorizing Provider  albuterol (PROVENTIL HFA;VENTOLIN HFA) 108 (90 Base) MCG/ACT inhaler Inhale 2 puffs into the lungs every 6 (six) hours as needed for wheezing. 09/23/17  Yes Janith Lima, MD  amLODipine (NORVASC) 5 MG tablet TAKE 1 TABLET BY MOUTH ONCE DAILY Patient taking differently: Take 5 mg by mouth daily.  07/15/18  Yes Strader, [place] M, PA-C  apixaban (ELIQUIS) 5 MG TABS tablet Take 1 tablet (5 mg total) by  mouth 2 (two) times daily. 06/19/18  Yes Lelon Perla, MD  fish oil-omega-3 fatty acids 1000 MG capsule Take 2 g by mouth at bedtime.    Yes [provider]  hydrochlorothiazide (HYDRODIURIL) 25 MG tablet TAKE 1 TABLET BY MOUTH ONCE DAILY Patient taking differently: Take 25 mg by mouth daily.  06/26/18  Yes Janith Lima, MD  Insulin Detemir (LEVEMIR FLEXPEN) 100 UNIT/ML Pen Inject 15 units once daily. Patient taking differently: Inject 15 Units into the skin at bedtime.  07/31/16  Yes Philemon Kingdom, MD  insulin regular (NOVOLIN R RELION) 100 units/mL injection INJECT 5-8 UNITS SUBCUTANEOUSLY THREE TIMES DAILY before meals Patient taking differently: Inject 5-8 Units into the skin 2 (two) times daily before a meal.  04/16/17  Yes Philemon Kingdom, MD  linagliptin (TRADJENTA) 5 MG TABS tablet Take 1 tablet (5 mg total) by mouth daily. Patient taking differently: Take 5 mg by mouth at bedtime.  09/11/16  Yes Philemon Kingdom, MD  losartan (COZAAR) 100 MG tablet TAKE 1 TABLET BY MOUTH ONCE DAILY Patient taking differently: Take 100 mg by mouth daily.  06/30/18  Yes Strader, [place], PA-C  metFORMIN (GLUCOPHAGE) 500 MG tablet Take 1 tablet (500 mg total) by mouth 2 (two) times daily with a meal. 12/02/16  Yes Philemon Kingdom, MD  metoprolol succinate (TOPROL-XL) 50 MG 24 hr tablet TAKE 1 TABLET BY MOUTH ONCE DAILY WITH  OR  IMMEDIATELY  FOLLOWING  A  MEAL Patient taking differently: Take 50 mg by mouth daily.  07/20/18  Yes Lelon Perla, MD  Multiple Vitamin (MULTIVITAMIN WITH MINERALS) TABS tablet Take 1 tablet by mouth daily.   Yes [provider]  omeprazole (PRILOSEC) 20 MG capsule TAKE 1 CAPSULE BY MOUTH ONCE DAILY Patient taking differently: Take 20 mg by mouth daily.  03/16/18  Yes Janith Lima, MD  diazepam (VALIUM) 5 MG tablet Take 1 tablet (5 mg total) by mouth every 6 (six) hours as needed for anxiety. Patient not taking: Reported on 07/27/2018 02/23/18    Janith Lima, MD  glucose blood (ONETOUCH VERIO) test strip USE AS INSTRUCTED THREE TIMES DAILY. DX CODE E11.21 01/12/18   Philemon Kingdom, MD  Glycopyrrolate-Formoterol (BEVESPI AEROSPHERE) 9-4.8 MCG/ACT AERO Inhale 2 Act 2 (two) times daily into the lungs. Patient not taking: Reported on 07/27/2018 06/12/17   Janith Lima, MD  Insulin Syringe-Needle U-100 (EASY TOUCH INSULIN SYRINGE) 31G X 5/16" 0.3 ML MISC USE AS DIRECTED 4 TIMES DAILY 05/01/16   Philemon Kingdom, MD  levocetirizine (XYZAL) 5 MG tablet Take 1 tablet (5 mg total) by mouth every evening. Patient not taking: Reported on 07/27/2018 02/23/18   Janith Lima, MD  montelukast (SINGULAIR) 10 MG tablet Take 1 tablet (10 mg total) by mouth at bedtime. Patient not taking: Reported on 07/27/2018 02/23/18   Janith Lima, MD  Helmut Muster  R RELION 100 UNIT/ML injection INJECT 3 TO 6 UNITS SUBCUTANEOUSLY THREE TIMES DAILY Patient not taking: Reported on 07/27/2018 04/27/18   Philemon Kingdom, MD    Current Facility-Administered Medications  Medication Dose Route Frequency Provider Last Rate Last Dose  . albuterol (PROVENTIL) (2.5 MG/3ML) 0.083% nebulizer solution 3 mL  3 mL Inhalation Q6H PRN Mariel Aloe, MD      . amLODipine (NORVASC) tablet 5 mg  5 mg Oral Daily Mariel Aloe, MD   5 mg at 07/28/18 1008  . apixaban (ELIQUIS) tablet 5 mg  5 mg Oral BID Mariel Aloe, MD   5 mg at 07/28/18 1008  . hydrochlorothiazide (HYDRODIURIL) tablet 25 mg  25 mg Oral Daily Mariel Aloe, MD   25 mg at 07/28/18 1008  . ibuprofen (ADVIL,MOTRIN) tablet 400 mg  400 mg Oral Q6H PRN Mariel Aloe, MD      . insulin aspart (novoLOG) injection 0-15 Units  0-15 Units Subcutaneous TID WC Mariel Aloe, MD   1 Units at 07/28/18 [phone]  . insulin detemir (LEVEMIR) injection 15 Units  15 Units Subcutaneous QHS Mariel Aloe, MD   15 Units at 07/27/18 2209  . linagliptin (TRADJENTA) tablet 5 mg  5 mg Oral QHS Mariel Aloe, MD      . losartan  (COZAAR) tablet 100 mg  100 mg Oral Daily Mariel Aloe, MD   100 mg at 07/28/18 1007  . metoprolol succinate (TOPROL-XL) 24 hr tablet 50 mg  50 mg Oral Daily Mariel Aloe, MD   50 mg at 07/28/18 1007  . ondansetron (ZOFRAN) tablet 4 mg  4 mg Oral Q6H PRN Mariel Aloe, MD       Or  . ondansetron (ZOFRAN) injection 4 mg  4 mg Intravenous Q6H PRN Mariel Aloe, MD      . pantoprazole (PROTONIX) EC tablet 40 mg  40 mg Oral Daily Mariel Aloe, MD   40 mg at 07/28/18 1007    Allergies as of 07/27/2018 - Review Complete 07/27/2018  Allergen Reaction Noted  . Hydralazine Shortness Of Breath 11/27/2012  . Lisinopril Cough 06/26/2011  . Penicillins Other (See Comments) 06/26/2011  . Tribenzor [olmesartan-amlodipine-hctz] Nausea Only 08/27/2011    Family History  Problem Relation Age of Onset  . Prostate cancer Father   . Colon cancer Father   . Diabetes Father   . Arthritis Father   . Diabetes type II Mother   . Atrial fibrillation Mother   . Hypertension Mother   . Arthritis Mother   . Cancer Other        Colon,Breast, Prostate Cancer  . Hypertension Other   . Prostate cancer Brother        5 brothers - 2 were dx with prostate cancer  . Esophageal cancer Neg Hx   . Stomach cancer Neg Hx   . Rectal cancer Neg Hx     Social History   Socioeconomic History  . Marital status: Single    Spouse name: Not on file  . Number of children: 2  . Years of education: Not on file  . Highest education level: Not on file  Occupational History  . Not on file  Social Needs  . Financial resource strain: Not hard at all  . Food insecurity:    Worry: Never true    Inability: Never true  . Transportation needs:    Medical: No    Non-medical: No  Tobacco Use  .  Smoking status: Former Smoker    Types: Cigarettes    Last attempt to quit: 07/30/2011    Years since quitting: 7.0  . Smokeless tobacco: Never Used  . Tobacco comment: Smoked since age 50  Substance and Sexual Activity   . Alcohol use: Yes    Alcohol/week: 1.0 standard drinks    Types: 1 Standard drinks or equivalent per week  . Drug use: No  . Sexual activity: Not Currently  Lifestyle  . Physical activity:    Days per week: 3 days    Minutes per session: 50 min  . Stress: Not at all  Relationships  . Social connections:    Talks on phone: More than three times a week    Gets together: More than three times a week    Attends religious service: More than 4 times per year    Active member of club or organization: Yes    Attends meetings of clubs or organizations: More than 4 times per year    Relationship status: Not on file  . Intimate partner violence:    Fear of current or ex partner: No    Emotionally abused: No    Physically abused: No    Forced sexual activity: No  Other Topics Concern  . Not on file  Social History Narrative  . Not on file    Review of Systems: All systems reviewed and negative except where noted in HPI.  Physical Exam: Vital signs in last 24 hours: Temp:  [98.2 F (36.8 C)-98.3 F (36.8 C)] 98.2 F (36.8 C) (12/31 0535) Pulse Rate:  [80-81] 80 (12/31 0535) Resp:  [18] 18 (12/30 2034) BP: (142-144)/(81-90) 142/90 (12/31 0535) SpO2:  [98 %-100 %] 98 % (12/31 0535) Weight:  [88.4 kg] 88.4 kg (12/30 2034) Last BM Date: 07/28/18 General:   Alert, well-developed, black male in NAD Psych:  Pleasant, cooperative. Normal mood and affect. Eyes:  Pupils equal, sclera clear, no icterus.   Conjunctiva pink. Ears:  Normal auditory acuity. Nose:  No deformity, discharge,  or lesions. Neck:  Supple; no masses Lungs:  Clear throughout to auscultation.   No wheezes, crackles, or rhonchi.  Heart:  Regular rate and rhythm; no murmurs, no lower extremity edema Abdomen:  Soft, non-distended, nontender, BS active, no palp mass    Rectal:  Deferred  Msk:  Symmetrical without gross deformities. . Neurologic:  Alert and  oriented x4;  grossly normal neurologically. Skin:  Intact  without significant lesions or rashes.   Intake/Output from previous day: 12/30 0701 - 12/31 0700 In: 1000 [IV Piggyback:1000] Out: -  Intake/Output this shift: Total I/O In: 220 [P.O.:220] Out: -   Lab Results: Recent Labs    07/27/18 1141  WBC 6.5  HGB 13.3  HCT 41.8  PLT 325   BMET Recent Labs    07/27/18 1141 07/27/18 1554 07/28/18 0459  NA 137 138 140  K 3.6 3.8 3.3*  CL 97* 100 101  CO2 _0 GLUCOSE 119* 132* 114*  BUN _1 CREATININE 1.39* 1.40* 1.43*  CALCIUM 9.5 9.1 9.0   LFT Recent Labs    07/27/18 2100 07/28/18 0459  PROT  --  7.1  ALBUMIN  --  3.4*  AST  --  379*  ALT  --  417*  ALKPHOS  --  172*  BILITOT 8.8* 9.1*  BILIDIR 4.8*  --   IBILI 4.0*  --    PT/INR Recent Labs    07/27/18  1554 07/28/18 0459  LABPROT 14.5 14.7  INR 1.14 1.16   Hepatitis Panel Recent Labs    07/27/18 1301  HEPBSAG Negative  HCVAB <0.1  HEPAIGM Negative  HEPBIGM Negative      Studies/Results: Ct Renal Stone Study  Result Date: 07/27/2018 CLINICAL DATA:  Right flank pain and discolored urine for 4-5 days. EXAM: CT ABDOMEN AND PELVIS WITHOUT CONTRAST TECHNIQUE: Multidetector CT imaging of the abdomen and pelvis was performed following the standard protocol without IV contrast. COMPARISON:  None. FINDINGS: Lower chest: There is cardiomegaly. No pleural or pericardial effusion. Lung bases are clear. Hepatobiliary: No focal liver abnormality is seen. No gallstones, gallbladder wall thickening, or biliary dilatation. Pancreas: Unremarkable. No pancreatic ductal dilatation or surrounding inflammatory changes. Spleen: Normal in size without focal abnormality. Adrenals/Urinary Tract: Adrenal glands are unremarkable. Kidneys are normal, without renal calculi, focal solid lesion, or hydronephrosis. Small right renal cyst is noted. Bladder is unremarkable. Stomach/Bowel: Surgical anastomosis at the junction of the descending and sigmoid colon noted. Scattered  diverticulosis is seen throughout the colon but there is no evidence of diverticulitis. The appendix is not visualized and may have been removed. Small hiatal hernia is noted. The stomach is otherwise unremarkable. Small bowel loops appear normal. Vascular/Lymphatic: Aortic atherosclerosis. No enlarged abdominal or pelvic lymph nodes. Reproductive: Prostate gland is enlarged. Other: Small fat containing supraumbilical hernia is identified. Musculoskeletal: No fracture or worrisome lesion. Facet degenerative disease results in 0.7 cm anterolisthesis L5 on S1. IMPRESSION: Negative for urinary tract stone. No acute abnormality abdomen or pelvis. Cardiomegaly. Atherosclerosis. Diverticulosis without diverticulitis. Fat containing supraumbilical hernia. Lower lumbar spondylosis. Electronically Signed   By: Inge Rise M.D.   On: 07/27/2018 12:39    US Abdomen Limited Ruq  Result Date: 07/27/2018 CLINICAL DATA:  Elevated LFT EXAM: ULTRASOUND ABDOMEN LIMITED RIGHT UPPER QUADRANT COMPARISON:  CT 07/27/2018 FINDINGS: Gallbladder: Moderate sludge in the gallbladder. Normal wall thickness. Negative sonographic Murphy. Common bile duct: Diameter: 10 mm Liver: Increased echogenicity. Mild intra hepatic biliary dilatation. No focal hepatic abnormality. Portal vein is patent on color Doppler imaging with normal direction of blood flow towards the liver. Isoechoic area surrounding the right kidney, no correlate on CT, may represent prominent retroperitoneal fat deposition. IMPRESSION: 1. Moderate sludge in the gallbladder without sonographic evidence for an acute cholecystitis 2. Mild intra hepatic biliary dilatation with enlarged extrahepatic common bile duct up to 1 cm. Further evaluation with MRCP as clinically indicated 3. Echogenic liver suggesting steatosis and or hepatocellular disease. Electronically Signed   By: Donavan Foil M.D.   On: 07/27/2018 20:03     Tye Savoy, NP-C @  07/28/2018, 11:12  AM

## 2018-07-28 NOTE — Progress Notes (Signed)
PROGRESS NOTE    [name] [name]  ZOX:[id number] DOB: Dec 11, 1950 DOA: 07/27/2018 PCP: Janith Lima, MD   Brief Narrative: [name] [name] is a 67 y.o. male with medical history significant of diabetes mellitus type 2, hypertension, atrial fibrillation on anticoagulation, hyperlipidemia, COPD, asthma, diverticulitis. He presented with elevated LFTs and bilirubin.   Assessment & Plan:   Active Problems:   Essential hypertension, benign   GERD (gastroesophageal reflux disease)   Hyperlipidemia with target LDL less than 100   Atrial fibrillation (HCC)   Elevated LFTs   Elevated LFTs Elevated bilirubin Evidence of hepatitis.  Unsure if this is acute or subacute. ? Passed gallstone, although no evidence of cholelithiasis on CT scan INR is also elevated. Possibly related to statin as this was started in July 3 times per week and increased to daily dosing in September. RUQ ultrasound significant for dilated common and intrahepatic duct. Also significant for biliary sludge. LFTs/bilirubin stable -MRCP pending  Right flank pain Likely secondary to hepatitis.  CT scan of abdomen pelvis was largely unremarkable for an etiology.  Atrial fibrillation Currently rate controlled. On metoprolol for rate control and recently started on Eliquis. -Hold Eliquis in anticipation of possible ERCP -Continue metoprolol  Essential hypertension Normotensive -Continue home amlodipine, HCTZ, losartan, Metoprolol  Diabetes mellitus type 2 Last hemoglobin A1c of 6.1% in October 2019.  Currently on Levemir, Novolin R, Tradjenta, metformin as an outpatient.  Follows with endocrinology. -Continue home Levemir, Tradjenta -SSI moderate qAC  GERD Prilosec as an outpatient -Continue PPI (Protonix)  Asthma Stable. -Continue albuterol prn  Hyperlipidemia Currently on Crestor as an outpatient. -Discontinue Crestor in setting of elevated LFTs  CKD stage II Baseline creatinine of 1.2-1.3.  stable. Has recently had acute kidney injury, but baseline appears to be in the CKD II range.  History of possible TIA MRI head ordered as an outpatient but never obtained. Was placed on statin at that time.   DVT prophylaxis: SCDs (holding Eliquis) Code Status:   Code Status: Full Code Family Communication: None at bedside Disposition Plan: Discharge pending GI workup   Consultants:   [facility]  Procedures:   None  Antimicrobials:  None    Subjective: Some right sided abdominal discomfort. Not much flank pain. Worried about tolerating the MRI.  Objective: Vitals:   07/27/18 1015 07/27/18 2034 07/28/18 0535  BP: 135/83 (!) 144/81 (!) 142/90  Pulse: (!) 109 81 80  Resp: 18 18   Temp: 98.4 F (36.9 C) 98.3 F (36.8 C) 98.2 F (36.8 C)  TempSrc: Oral Oral Oral  SpO2: 96% 100% 98%  Weight:  88.4 kg   Height:  5\' 10"  (1.778 m)     Intake/Output Summary (Last 24 hours) at 07/28/2018 1352 Last data filed at 07/28/2018 0950 Gross per 24 hour  Intake 220 ml  Output -  Net 220 ml   Filed Weights   07/27/18 2034  Weight: 88.4 kg    Examination:  General exam: Appears calm and comfortable Respiratory system: Clear to auscultation. Respiratory effort normal. Cardiovascular system: S1 & S2 heard, RRR. No murmurs, rubs, gallops or clicks. Gastrointestinal system: Abdomen is nondistended, soft and nontender. No organomegaly or masses felt. Normal bowel sounds heard. Central nervous system: Alert and oriented. No focal neurological deficits. Extremities: No edema. No calf tenderness Skin: No cyanosis. No rashes Psychiatry: Judgement and insight appear normal. Mood & affect appropriate.     Data Reviewed: I have personally reviewed following labs and imaging studies  CBC: Recent Labs  Lab 07/27/18 1141  WBC 6.5  NEUTROABS 4.5  HGB 13.3  HCT 41.8  MCV 97.2  PLT 628   Basic Metabolic Panel: Recent Labs  Lab 07/27/18 1141 07/27/18 1554 07/28/18 0459   NA 137 138 140  K 3.6 3.8 3.3*  CL 97* 100 101  CO2 28 28 27   GLUCOSE 119* 132* 114*  BUN 14 14 13   CREATININE 1.39* 1.40* 1.43*  CALCIUM 9.5 9.1 9.0   GFR: Estimated Creatinine Clearance: 54.6 mL/min (A) (by C-G formula based on SCr of 1.43 mg/dL (H)). Liver Function Tests: Recent Labs  Lab 07/27/18 1141 07/27/18 1554 07/27/18 2100 07/28/18 0459  AST 380* 351*  --  379*  ALT 401* 378*  --  417*  ALKPHOS 199* 178*  --  172*  BILITOT 9.8* 8.9* 8.8* 9.1*  PROT 8.3* 7.6  --  7.1  ALBUMIN 3.8 3.6  --  3.4*   Recent Labs  Lab 07/27/18 1141  LIPASE 34   No results for input(s): AMMONIA in the last 168 hours. Coagulation Profile: Recent Labs  Lab 07/27/18 1301 07/27/18 1554 07/28/18 0459  INR 1.22 1.14 1.16   Cardiac Enzymes: No results for input(s): CKTOTAL, CKMB, CKMBINDEX, TROPONINI in the last 168 hours. BNP (last 3 results) No results for input(s): PROBNP in the last 8760 hours. HbA1C: No results for input(s): HGBA1C in the last 72 hours. CBG: Recent Labs  Lab 07/27/18 2107 07/28/18 0804 07/28/18 1225  GLUCAP 121* 126* 122*   Lipid Profile: No results for input(s): CHOL, HDL, LDLCALC, TRIG, CHOLHDL, LDLDIRECT in the last 72 hours. Thyroid Function Tests: No results for input(s): TSH, T4TOTAL, FREET4, T3FREE, THYROIDAB in the last 72 hours. Anemia Panel: No results for input(s): VITAMINB12, FOLATE, FERRITIN, TIBC, IRON, RETICCTPCT in the last 72 hours. Sepsis Labs: No results for input(s): PROCALCITON, LATICACIDVEN in the last 168 hours.  No results found for this or any previous visit (from the past 240 hour(s)).       Radiology Studies: Ct Renal Stone Study  Result Date: 07/27/2018 CLINICAL DATA:  Right flank pain and discolored urine for 4-5 days. EXAM: CT ABDOMEN AND PELVIS WITHOUT CONTRAST TECHNIQUE: Multidetector CT imaging of the abdomen and pelvis was performed following the standard protocol without IV contrast. COMPARISON:  None.  FINDINGS: Lower chest: There is cardiomegaly. No pleural or pericardial effusion. Lung bases are clear. Hepatobiliary: No focal liver abnormality is seen. No gallstones, gallbladder wall thickening, or biliary dilatation. Pancreas: Unremarkable. No pancreatic ductal dilatation or surrounding inflammatory changes. Spleen: Normal in size without focal abnormality. Adrenals/Urinary Tract: Adrenal glands are unremarkable. Kidneys are normal, without renal calculi, focal solid lesion, or hydronephrosis. Small right renal cyst is noted. Bladder is unremarkable. Stomach/Bowel: Surgical anastomosis at the junction of the descending and sigmoid colon noted. Scattered diverticulosis is seen throughout the colon but there is no evidence of diverticulitis. The appendix is not visualized and may have been removed. Small hiatal hernia is noted. The stomach is otherwise unremarkable. Small bowel loops appear normal. Vascular/Lymphatic: Aortic atherosclerosis. No enlarged abdominal or pelvic lymph nodes. Reproductive: Prostate gland is enlarged. Other: Small fat containing supraumbilical hernia is identified. Musculoskeletal: No fracture or worrisome lesion. Facet degenerative disease results in 0.7 cm anterolisthesis L5 on S1. IMPRESSION: Negative for urinary tract stone. No acute abnormality abdomen or pelvis. Cardiomegaly. Atherosclerosis. Diverticulosis without diverticulitis. Fat containing supraumbilical hernia. Lower lumbar spondylosis. Electronically Signed   By: Inge Rise M.D.   On: 07/27/2018 12:39   Vas US  Aorta/ivc/iliacs  Result Date: 07/27/2018 ABDOMINAL AORTA STUDY Indications: Patient c/o right flank discomfort. Risk Factors: Hypertension, hyperlipidemia, Diabetes, past history of smoking. Limitations: Air/bowel gas.  Comparison Study: 06/28/14 at [facility], visceral duplex showed no AAA, but did show                   increased left common iliac artery size at 1.5 cm x 1.6 cm,                   and elevated  velocity of 229 cm/sec. Performing Technologist: [employer] BS, RVT, RDCS  Examination Guidelines: A complete evaluation includes B-mode imaging, spectral Doppler, color Doppler, and power Doppler as needed of all accessible portions of each vessel. Bilateral testing is considered an integral part of a complete examination. Limited examinations for reoccurring indications may be performed as noted.  Abdominal Aorta Findings: +-------------+-------+----------+----------+--------+--------+--------+ Location     AP (cm)Trans (cm)PSV (cm/s)WaveformThrombusComments +-------------+-------+----------+----------+--------+--------+--------+ Proximal     2.10   2.20      110                                +-------------+-------+----------+----------+--------+--------+--------+ Mid          1.90   1.90      67                                 +-------------+-------+----------+----------+--------+--------+--------+ Distal       1.80   2.00      43                                 +-------------+-------+----------+----------+--------+--------+--------+ RT CIA Prox  1.6    1.7       101                                +-------------+-------+----------+----------+--------+--------+--------+ RT CIA Mid                    76                                 +-------------+-------+----------+----------+--------+--------+--------+ RT CIA Distal                 98                                 +-------------+-------+----------+----------+--------+--------+--------+ RT EIA Prox  1.0    1.1       104                                +-------------+-------+----------+----------+--------+--------+--------+ RT EIA Mid                    77                                 +-------------+-------+----------+----------+--------+--------+--------+ RT EIA Distal                 72                                  +-------------+-------+----------+----------+--------+--------+--------+  LT CIA Prox  1.6    1.6       153                                +-------------+-------+----------+----------+--------+--------+--------+ LT CIA Mid                    219                                +-------------+-------+----------+----------+--------+--------+--------+ LT EIA Prox  1.1    1.1       74                                 +-------------+-------+----------+----------+--------+--------+--------+ LT EIA Mid                    87                                 +-------------+-------+----------+----------+--------+--------+--------+ LT EIA Distal                 73                                 +-------------+-------+----------+----------+--------+--------+--------+  Visualization of the Left CIA Distal artery was limited. Limited imaging of the right flank reveals an enlarged gall bladder, measuring 12 cm in length and 4.5 cm in width (normal values are respectively 7-10 cm long, and 2.8-3.9 wide). IVC/Iliac Findings: +--------+------+--------+--------+   IVC   PatentThrombusComments +--------+------+--------+--------+ IVC Proxpatent                 +--------+------+--------+--------+  Summary: Abdominal Aorta: No evidence of an abdominal aortic aneurysm was visualized. 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 The largest aortic measurement is 2.2 cm. 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  *See table(s) above for measurements and observations. Suggest follow up study in 12 months.  Electronically signed by Ida Rogue MD on 07/27/2018 at 10:27:46 PM.    Final      US Abdomen Limited Ruq  Result Date: 07/27/2018 CLINICAL DATA:  Elevated LFT EXAM: ULTRASOUND ABDOMEN LIMITED RIGHT UPPER QUADRANT COMPARISON:  CT 07/27/2018 FINDINGS: Gallbladder: Moderate sludge in the gallbladder. Normal wall thickness. Negative sonographic Murphy. Common bile duct: Diameter: 10 mm Liver: Increased echogenicity. Mild intra hepatic biliary dilatation. No focal hepatic abnormality. Portal vein is patent on color Doppler imaging with normal direction of blood flow towards the liver. Isoechoic area surrounding the right kidney, no correlate on CT, may represent prominent retroperitoneal fat deposition. IMPRESSION: 1. Moderate sludge in the gallbladder without sonographic evidence for an acute cholecystitis 2. Mild intra hepatic biliary dilatation with enlarged extrahepatic common bile duct up to 1 cm. Further evaluation with MRCP as clinically indicated 3. Echogenic liver suggesting steatosis and or hepatocellular disease. Electronically Signed   By: Donavan Foil M.D.   On: 07/27/2018 20:03        Scheduled Meds: . amLODipine  5 mg  Oral Daily  . hydrochlorothiazide  25 mg Oral Daily  . insulin aspart  0-15 Units Subcutaneous TID WC  . insulin detemir  15 Units Subcutaneous QHS  . linagliptin  5 mg Oral QHS  . losartan  100 mg Oral Daily  . metoprolol succinate  50 mg Oral Daily  . pantoprazole  40 mg Oral Daily   Continuous Infusions:   LOS: 0 days     Cordelia Poche, MD [facility] 07/28/2018, 1:52 PM  If 7PM-7AM, please contact night-coverage [email or web address]

## 2018-07-28 NOTE — Consult Note (Signed)
Referring Provider: [facility]  Primary Care Physician:  Janith Lima, MD Primary Gastroenterologist:  None. Was scheduled to see Dr. Carlean Purl in 2018 but had to cancel.      Reason for Consultation:   Abnormal liver tests    ASSESSMENT / PLAN:    [birth year]. 67 year old male with 3-day history of remittent right mid back / right flank and RUQ pain in setting of markedly abnormal liver tests (mixed pattern).  Ultrasound confirms gallbladder sludge, iintrahepatic and extrahepatic biliary duct dilation.   -With the pain, u/s findings and abnormal liver tests one could almost make an argument for ERCP. However, his indirect and direct bilirubin are both elevated to about the same degree. Given this will first obtain MRCP / MRI.  Patient says he has claustrophobia and has had difficult time bleeding MRI in the past.  Will try dose of Valium 30 minutes prior to the exam -check am labs  2. AFIB on Eliquis.  -I will talk with Hospitalist about holding Eliquis in anticipation of an ERCP   HPI:      HPI: [name] [name] is a 67 y.o. male with GERD, DM2, HTN, CKD 2, AFIB on anticoagulation, COPD. He presented to ED last night with right flank pain reminiscent of a kidney stone a year ago. Non-contrast CTAP scan unremarkable for etiology but liver tests found to be markedly elevated.  Data reviewed:  Alk phos 199 AST 380 ALT 401 Tbili 9.8 Lipase normal. WBC 6.5 hgb 13.3  INR 1.16 RUQ u/s -Moderate sludge in the gallbladder without sonographic evidence for an acute cholecystitis. Mild intra hepatic biliary dilatation with enlarged extrahepatic common bile duct up to 1 cm. Further evaluation with MRCP as clinically indicated. Echogenic liver suggesting steatosis and or hepatocellular Disease.  Repeat liver tests last night - tbili 8.8, indirect bili 4.0 / direct 4.8.  Today's labs - tbili 9.1, transaminases and alk phos about the same  A few days ago patient developed pain in right mid  back/right flank area.  He also noticed some pain in his RUQ but it was not as severe as the pain in the flank and back.  Patient initially thought he had another kidney stone.  The pain was intermittent, burning in nature.  He felt better when lying on his right side. Interestingly, eating seem to make the pain better.  Initially he had some associated nausea but that dissipated quickly.  Patient noticed that his urine was darker than normal.  He also reports  light-colored stools over the last 3 days but bowel movements are otherwise normal.  No other GI complaints.  Patient has not had any blood in bowel movements or unexpected weight loss.    Past Medical History:  Diagnosis Date  . Allergy   . Asthma   . Atrial fibrillation ([place])    a. initially occuring in 2014, has declined anticoagulation since having a GI bleed with initiation of Xarelto  . Cardiomyopathy 2011   a. Patient was told that he had an ejection fraction of 37% by echo done elsewhere in 2011 b. 06/2014: echo showing preserved EF of 55-60%, severe asymetric septal hypertrophy with no SAM or LVOT gradient  . COPD (chronic obstructive pulmonary disease) ([place])   . Diabetes mellitus without complication ([place])   . Diverticulitis   . GERD (gastroesophageal reflux disease)   . History of colon polyps   . Hypertension   . Tobacco abuse     Past Surgical History:  Procedure Laterality  Date  . COLON SURGERY  2009  . COLONOSCOPY      Prior to Admission medications   Medication Sig Start Date End Date Taking? Authorizing Provider  albuterol (PROVENTIL HFA;VENTOLIN HFA) 108 (90 Base) MCG/ACT inhaler Inhale 2 puffs into the lungs every 6 (six) hours as needed for wheezing. 09/23/17  Yes Janith Lima, MD  amLODipine (NORVASC) 5 MG tablet TAKE 1 TABLET BY MOUTH ONCE DAILY Patient taking differently: Take 5 mg by mouth daily.  07/15/18  Yes Strader, [place] M, PA-C  apixaban (ELIQUIS) 5 MG TABS tablet Take 1 tablet (5 mg total) by  mouth 2 (two) times daily. 06/19/18  Yes Lelon Perla, MD  fish oil-omega-3 fatty acids 1000 MG capsule Take 2 g by mouth at bedtime.    Yes [provider]  hydrochlorothiazide (HYDRODIURIL) 25 MG tablet TAKE 1 TABLET BY MOUTH ONCE DAILY Patient taking differently: Take 25 mg by mouth daily.  06/26/18  Yes Janith Lima, MD  Insulin Detemir (LEVEMIR FLEXPEN) 100 UNIT/ML Pen Inject 15 units once daily. Patient taking differently: Inject 15 Units into the skin at bedtime.  07/31/16  Yes Philemon Kingdom, MD  insulin regular (NOVOLIN R RELION) 100 units/mL injection INJECT 5-8 UNITS SUBCUTANEOUSLY THREE TIMES DAILY before meals Patient taking differently: Inject 5-8 Units into the skin 2 (two) times daily before a meal.  04/16/17  Yes Philemon Kingdom, MD  linagliptin (TRADJENTA) 5 MG TABS tablet Take 1 tablet (5 mg total) by mouth daily. Patient taking differently: Take 5 mg by mouth at bedtime.  09/11/16  Yes Philemon Kingdom, MD  losartan (COZAAR) 100 MG tablet TAKE 1 TABLET BY MOUTH ONCE DAILY Patient taking differently: Take 100 mg by mouth daily.  06/30/18  Yes Strader, [place], PA-C  metFORMIN (GLUCOPHAGE) 500 MG tablet Take 1 tablet (500 mg total) by mouth 2 (two) times daily with a meal. 12/02/16  Yes Philemon Kingdom, MD  metoprolol succinate (TOPROL-XL) 50 MG 24 hr tablet TAKE 1 TABLET BY MOUTH ONCE DAILY WITH  OR  IMMEDIATELY  FOLLOWING  A  MEAL Patient taking differently: Take 50 mg by mouth daily.  07/20/18  Yes Lelon Perla, MD  Multiple Vitamin (MULTIVITAMIN WITH MINERALS) TABS tablet Take 1 tablet by mouth daily.   Yes [provider]  omeprazole (PRILOSEC) 20 MG capsule TAKE 1 CAPSULE BY MOUTH ONCE DAILY Patient taking differently: Take 20 mg by mouth daily.  03/16/18  Yes Janith Lima, MD  diazepam (VALIUM) 5 MG tablet Take 1 tablet (5 mg total) by mouth every 6 (six) hours as needed for anxiety. Patient not taking: Reported on 07/27/2018 02/23/18    Janith Lima, MD  glucose blood (ONETOUCH VERIO) test strip USE AS INSTRUCTED THREE TIMES DAILY. DX CODE E11.21 01/12/18   Philemon Kingdom, MD  Glycopyrrolate-Formoterol (BEVESPI AEROSPHERE) 9-4.8 MCG/ACT AERO Inhale 2 Act 2 (two) times daily into the lungs. Patient not taking: Reported on 07/27/2018 06/12/17   Janith Lima, MD  Insulin Syringe-Needle U-100 (EASY TOUCH INSULIN SYRINGE) 31G X 5/16" 0.3 ML MISC USE AS DIRECTED 4 TIMES DAILY 05/01/16   Philemon Kingdom, MD  levocetirizine (XYZAL) 5 MG tablet Take 1 tablet (5 mg total) by mouth every evening. Patient not taking: Reported on 07/27/2018 02/23/18   Janith Lima, MD  montelukast (SINGULAIR) 10 MG tablet Take 1 tablet (10 mg total) by mouth at bedtime. Patient not taking: Reported on 07/27/2018 02/23/18   Janith Lima, MD  Helmut Muster  R RELION 100 UNIT/ML injection INJECT 3 TO 6 UNITS SUBCUTANEOUSLY THREE TIMES DAILY Patient not taking: Reported on 07/27/2018 04/27/18   Philemon Kingdom, MD    Current Facility-Administered Medications  Medication Dose Route Frequency Provider Last Rate Last Dose  . albuterol (PROVENTIL) (2.5 MG/3ML) 0.083% nebulizer solution 3 mL  3 mL Inhalation Q6H PRN Mariel Aloe, MD      . amLODipine (NORVASC) tablet 5 mg  5 mg Oral Daily Mariel Aloe, MD   5 mg at 07/28/18 1008  . apixaban (ELIQUIS) tablet 5 mg  5 mg Oral BID Mariel Aloe, MD   5 mg at 07/28/18 1008  . hydrochlorothiazide (HYDRODIURIL) tablet 25 mg  25 mg Oral Daily Mariel Aloe, MD   25 mg at 07/28/18 1008  . ibuprofen (ADVIL,MOTRIN) tablet 400 mg  400 mg Oral Q6H PRN Mariel Aloe, MD      . insulin aspart (novoLOG) injection 0-15 Units  0-15 Units Subcutaneous TID WC Mariel Aloe, MD   1 Units at 07/28/18 [phone]  . insulin detemir (LEVEMIR) injection 15 Units  15 Units Subcutaneous QHS Mariel Aloe, MD   15 Units at 07/27/18 2209  . linagliptin (TRADJENTA) tablet 5 mg  5 mg Oral QHS Mariel Aloe, MD      . losartan  (COZAAR) tablet 100 mg  100 mg Oral Daily Mariel Aloe, MD   100 mg at 07/28/18 1007  . metoprolol succinate (TOPROL-XL) 24 hr tablet 50 mg  50 mg Oral Daily Mariel Aloe, MD   50 mg at 07/28/18 1007  . ondansetron (ZOFRAN) tablet 4 mg  4 mg Oral Q6H PRN Mariel Aloe, MD       Or  . ondansetron (ZOFRAN) injection 4 mg  4 mg Intravenous Q6H PRN Mariel Aloe, MD      . pantoprazole (PROTONIX) EC tablet 40 mg  40 mg Oral Daily Mariel Aloe, MD   40 mg at 07/28/18 1007    Allergies as of 07/27/2018 - Review Complete 07/27/2018  Allergen Reaction Noted  . Hydralazine Shortness Of Breath 11/27/2012  . Lisinopril Cough 06/26/2011  . Penicillins Other (See Comments) 06/26/2011  . Tribenzor [olmesartan-amlodipine-hctz] Nausea Only 08/27/2011    Family History  Problem Relation Age of Onset  . Prostate cancer Father   . Colon cancer Father   . Diabetes Father   . Arthritis Father   . Diabetes type II Mother   . Atrial fibrillation Mother   . Hypertension Mother   . Arthritis Mother   . Cancer Other        Colon,Breast, Prostate Cancer  . Hypertension Other   . Prostate cancer Brother        5 brothers - 2 were dx with prostate cancer  . Esophageal cancer Neg Hx   . Stomach cancer Neg Hx   . Rectal cancer Neg Hx     Social History   Socioeconomic History  . Marital status: Single    Spouse name: Not on file  . Number of children: 2  . Years of education: Not on file  . Highest education level: Not on file  Occupational History  . Not on file  Social Needs  . Financial resource strain: Not hard at all  . Food insecurity:    Worry: Never true    Inability: Never true  . Transportation needs:    Medical: No    Non-medical: No  Tobacco Use  .  Smoking status: Former Smoker    Types: Cigarettes    Last attempt to quit: 07/30/2011    Years since quitting: 7.0  . Smokeless tobacco: Never Used  . Tobacco comment: Smoked since age 50  Substance and Sexual Activity   . Alcohol use: Yes    Alcohol/week: 1.0 standard drinks    Types: 1 Standard drinks or equivalent per week  . Drug use: No  . Sexual activity: Not Currently  Lifestyle  . Physical activity:    Days per week: 3 days    Minutes per session: 50 min  . Stress: Not at all  Relationships  . Social connections:    Talks on phone: More than three times a week    Gets together: More than three times a week    Attends religious service: More than 4 times per year    Active member of club or organization: Yes    Attends meetings of clubs or organizations: More than 4 times per year    Relationship status: Not on file  . Intimate partner violence:    Fear of current or ex partner: No    Emotionally abused: No    Physically abused: No    Forced sexual activity: No  Other Topics Concern  . Not on file  Social History Narrative  . Not on file    Review of Systems: All systems reviewed and negative except where noted in HPI.  Physical Exam: Vital signs in last 24 hours: Temp:  [98.2 F (36.8 C)-98.3 F (36.8 C)] 98.2 F (36.8 C) (12/31 0535) Pulse Rate:  [80-81] 80 (12/31 0535) Resp:  [18] 18 (12/30 2034) BP: (142-144)/(81-90) 142/90 (12/31 0535) SpO2:  [98 %-100 %] 98 % (12/31 0535) Weight:  [88.4 kg] 88.4 kg (12/30 2034) Last BM Date: 07/28/18 General:   Alert, well-developed, black male in NAD Psych:  Pleasant, cooperative. Normal mood and affect. Eyes:  Pupils equal, sclera clear, no icterus.   Conjunctiva pink. Ears:  Normal auditory acuity. Nose:  No deformity, discharge,  or lesions. Neck:  Supple; no masses Lungs:  Clear throughout to auscultation.   No wheezes, crackles, or rhonchi.  Heart:  Regular rate and rhythm; no murmurs, no lower extremity edema Abdomen:  Soft, non-distended, nontender, BS active, no palp mass    Rectal:  Deferred  Msk:  Symmetrical without gross deformities. . Neurologic:  Alert and  oriented x4;  grossly normal neurologically. Skin:  Intact  without significant lesions or rashes.   Intake/Output from previous day: 12/30 0701 - 12/31 0700 In: 1000 [IV Piggyback:1000] Out: -  Intake/Output this shift: Total I/O In: 220 [P.O.:220] Out: -   Lab Results: Recent Labs    07/27/18 1141  WBC 6.5  HGB 13.3  HCT 41.8  PLT 325   BMET Recent Labs    07/27/18 1141 07/27/18 1554 07/28/18 0459  NA 137 138 140  K 3.6 3.8 3.3*  CL 97* 100 101  CO2 _0 GLUCOSE 119* 132* 114*  BUN _1 CREATININE 1.39* 1.40* 1.43*  CALCIUM 9.5 9.1 9.0   LFT Recent Labs    07/27/18 2100 07/28/18 0459  PROT  --  7.1  ALBUMIN  --  3.4*  AST  --  379*  ALT  --  417*  ALKPHOS  --  172*  BILITOT 8.8* 9.1*  BILIDIR 4.8*  --   IBILI 4.0*  --    PT/INR Recent Labs    07/27/18  1554 07/28/18 0459  LABPROT 14.5 14.7  INR 1.14 1.16   Hepatitis Panel Recent Labs    07/27/18 1301  HEPBSAG Negative  HCVAB <0.1  HEPAIGM Negative  HEPBIGM Negative      Studies/Results: Ct Renal Stone Study  Result Date: 07/27/2018 CLINICAL DATA:  Right flank pain and discolored urine for 4-5 days. EXAM: CT ABDOMEN AND PELVIS WITHOUT CONTRAST TECHNIQUE: Multidetector CT imaging of the abdomen and pelvis was performed following the standard protocol without IV contrast. COMPARISON:  None. FINDINGS: Lower chest: There is cardiomegaly. No pleural or pericardial effusion. Lung bases are clear. Hepatobiliary: No focal liver abnormality is seen. No gallstones, gallbladder wall thickening, or biliary dilatation. Pancreas: Unremarkable. No pancreatic ductal dilatation or surrounding inflammatory changes. Spleen: Normal in size without focal abnormality. Adrenals/Urinary Tract: Adrenal glands are unremarkable. Kidneys are normal, without renal calculi, focal solid lesion, or hydronephrosis. Small right renal cyst is noted. Bladder is unremarkable. Stomach/Bowel: Surgical anastomosis at the junction of the descending and sigmoid colon noted. Scattered  diverticulosis is seen throughout the colon but there is no evidence of diverticulitis. The appendix is not visualized and may have been removed. Small hiatal hernia is noted. The stomach is otherwise unremarkable. Small bowel loops appear normal. Vascular/Lymphatic: Aortic atherosclerosis. No enlarged abdominal or pelvic lymph nodes. Reproductive: Prostate gland is enlarged. Other: Small fat containing supraumbilical hernia is identified. Musculoskeletal: No fracture or worrisome lesion. Facet degenerative disease results in 0.7 cm anterolisthesis L5 on S1. IMPRESSION: Negative for urinary tract stone. No acute abnormality abdomen or pelvis. Cardiomegaly. Atherosclerosis. Diverticulosis without diverticulitis. Fat containing supraumbilical hernia. Lower lumbar spondylosis. Electronically Signed   By: Inge Rise M.D.   On: 07/27/2018 12:39    US Abdomen Limited Ruq  Result Date: 07/27/2018 CLINICAL DATA:  Elevated LFT EXAM: ULTRASOUND ABDOMEN LIMITED RIGHT UPPER QUADRANT COMPARISON:  CT 07/27/2018 FINDINGS: Gallbladder: Moderate sludge in the gallbladder. Normal wall thickness. Negative sonographic Murphy. Common bile duct: Diameter: 10 mm Liver: Increased echogenicity. Mild intra hepatic biliary dilatation. No focal hepatic abnormality. Portal vein is patent on color Doppler imaging with normal direction of blood flow towards the liver. Isoechoic area surrounding the right kidney, no correlate on CT, may represent prominent retroperitoneal fat deposition. IMPRESSION: 1. Moderate sludge in the gallbladder without sonographic evidence for an acute cholecystitis 2. Mild intra hepatic biliary dilatation with enlarged extrahepatic common bile duct up to 1 cm. Further evaluation with MRCP as clinically indicated 3. Echogenic liver suggesting steatosis and or hepatocellular disease. Electronically Signed   By: Donavan Foil M.D.   On: 07/27/2018 20:03     Tye Savoy, NP-C @  07/28/2018, 11:12  AM

## 2018-07-29 DIAGNOSIS — R945 Abnormal results of liver function studies: Secondary | ICD-10-CM | POA: Diagnosis not present

## 2018-07-29 DIAGNOSIS — K838 Other specified diseases of biliary tract: Secondary | ICD-10-CM | POA: Diagnosis not present

## 2018-07-29 DIAGNOSIS — Z87891 Personal history of nicotine dependence: Secondary | ICD-10-CM | POA: Diagnosis not present

## 2018-07-29 DIAGNOSIS — R7989 Other specified abnormal findings of blood chemistry: Secondary | ICD-10-CM | POA: Diagnosis not present

## 2018-07-29 DIAGNOSIS — N182 Chronic kidney disease, stage 2 (mild): Secondary | ICD-10-CM | POA: Diagnosis not present

## 2018-07-29 DIAGNOSIS — F40240 Claustrophobia: Secondary | ICD-10-CM | POA: Diagnosis not present

## 2018-07-29 DIAGNOSIS — I429 Cardiomyopathy, unspecified: Secondary | ICD-10-CM | POA: Diagnosis not present

## 2018-07-29 DIAGNOSIS — I480 Paroxysmal atrial fibrillation: Secondary | ICD-10-CM | POA: Diagnosis not present

## 2018-07-29 DIAGNOSIS — R17 Unspecified jaundice: Secondary | ICD-10-CM | POA: Diagnosis not present

## 2018-07-29 DIAGNOSIS — Z7901 Long term (current) use of anticoagulants: Secondary | ICD-10-CM | POA: Diagnosis not present

## 2018-07-29 DIAGNOSIS — Z794 Long term (current) use of insulin: Secondary | ICD-10-CM | POA: Diagnosis not present

## 2018-07-29 DIAGNOSIS — B179 Acute viral hepatitis, unspecified: Secondary | ICD-10-CM | POA: Diagnosis present

## 2018-07-29 DIAGNOSIS — Z888 Allergy status to other drugs, medicaments and biological substances status: Secondary | ICD-10-CM | POA: Diagnosis not present

## 2018-07-29 DIAGNOSIS — E1122 Type 2 diabetes mellitus with diabetic chronic kidney disease: Secondary | ICD-10-CM | POA: Diagnosis not present

## 2018-07-29 DIAGNOSIS — R932 Abnormal findings on diagnostic imaging of liver and biliary tract: Secondary | ICD-10-CM | POA: Diagnosis not present

## 2018-07-29 DIAGNOSIS — K869 Disease of pancreas, unspecified: Secondary | ICD-10-CM | POA: Diagnosis not present

## 2018-07-29 DIAGNOSIS — Z880 Allergy status to penicillin: Secondary | ICD-10-CM | POA: Diagnosis not present

## 2018-07-29 DIAGNOSIS — I700 Atherosclerosis of aorta: Secondary | ICD-10-CM | POA: Diagnosis not present

## 2018-07-29 DIAGNOSIS — Z79899 Other long term (current) drug therapy: Secondary | ICD-10-CM | POA: Diagnosis not present

## 2018-07-29 DIAGNOSIS — K219 Gastro-esophageal reflux disease without esophagitis: Secondary | ICD-10-CM | POA: Diagnosis not present

## 2018-07-29 DIAGNOSIS — E785 Hyperlipidemia, unspecified: Secondary | ICD-10-CM | POA: Diagnosis not present

## 2018-07-29 DIAGNOSIS — J449 Chronic obstructive pulmonary disease, unspecified: Secondary | ICD-10-CM | POA: Diagnosis not present

## 2018-07-29 DIAGNOSIS — I10 Essential (primary) hypertension: Secondary | ICD-10-CM | POA: Diagnosis not present

## 2018-07-29 DIAGNOSIS — I129 Hypertensive chronic kidney disease with stage 1 through stage 4 chronic kidney disease, or unspecified chronic kidney disease: Secondary | ICD-10-CM | POA: Diagnosis not present

## 2018-07-29 LAB — COMPREHENSIVE METABOLIC PANEL
ALT: 453 U/L — ABNORMAL HIGH (ref 0–44)
AST: 351 U/L — AB (ref 15–41)
Albumin: 3.6 g/dL (ref 3.5–5.0)
Alkaline Phosphatase: 186 U/L — ABNORMAL HIGH (ref 38–126)
Anion gap: 11 (ref 5–15)
BILIRUBIN TOTAL: 10.5 mg/dL — AB (ref 0.3–1.2)
BUN: 17 mg/dL (ref 8–23)
CO2: 26 mmol/L (ref 22–32)
Calcium: 9.5 mg/dL (ref 8.9–10.3)
Chloride: 101 mmol/L (ref 98–111)
Creatinine, Ser: 1.25 mg/dL — ABNORMAL HIGH (ref 0.61–1.24)
GFR calc Af Amer: >60 mL/min (ref >60)
GFR calc non Af Amer: 58 mL/min — ABNORMAL LOW (ref >60)
Glucose, Bld: 154 mg/dL — ABNORMAL HIGH (ref 70–99)
Potassium: 3.5 mmol/L (ref 3.5–5.1)
Sodium: 138 mmol/L (ref 135–145)
Total Protein: 7.9 g/dL (ref 6.5–8.1)

## 2018-07-29 LAB — PROTIME-INR
INR: 1.02
Prothrombin Time: 13.3 seconds (ref 11.4–15.2)

## 2018-07-29 LAB — GLUCOSE, CAPILLARY
Glucose-Capillary: 153 mg/dL — ABNORMAL HIGH (ref 70–99)
Glucose-Capillary: 130 mg/dL — ABNORMAL HIGH (ref 70–99)
Glucose-Capillary: 101 mg/dL — ABNORMAL HIGH (ref 70–99)
Glucose-Capillary: 135 mg/dL — ABNORMAL HIGH (ref 70–99)

## 2018-07-29 MED ORDER — SODIUM CHLORIDE 0.9 % IV SOLN: INTRAVENOUS | Status: DC

## 2018-07-29 MED ORDER — WHITE PETROLATUM EX OINT
TOPICAL_OINTMENT | CUTANEOUS | Status: AC
  Administered 2018-07-29: 23:00:00
  Filled 2018-07-29: qty 28.35

## 2018-07-29 NOTE — Progress Notes (Signed)
PROGRESS NOTE    [name] [name]  QQI:[id number] DOB: 08-Jun-1951 DOA: 07/27/2018 PCP: Janith Lima, MD   Brief Narrative: [name] [name] is a 68 y.o. male with medical history significant of diabetes mellitus type 2, hypertension, atrial fibrillation on anticoagulation, hyperlipidemia, COPD, asthma, diverticulitis. He presented with elevated LFTs and bilirubin.   Assessment & Plan:   Active Problems:   Essential hypertension, benign   GERD (gastroesophageal reflux disease)   Hyperlipidemia with target LDL less than 100   Atrial fibrillation (HCC)   Abnormal LFTs (liver function tests)   Elevated bilirubin   Abnormal finding of biliary tract   Elevated LFTs Elevated bilirubin RUQ significant for sludge and common bile/hepatic ducts. 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 LFTs more or less stable but bilirubin has risen today. -GI recommendations: ERCP planned for tomorrow (07/30/2018); Recommendations for outpatient EUS with FNA  Right flank pain Likely secondary to hepatitis.  CT scan of abdomen pelvis was largely unremarkable for an etiology.  Atrial fibrillation Currently rate controlled. On metoprolol for rate control and recently started on Eliquis. -Hold Eliquis in anticipation of possible ERCP -Continue metoprolol  Essential hypertension Normotensive -Continue home amlodipine, HCTZ, losartan, Metoprolol  Diabetes mellitus type 2 Last hemoglobin A1c of 6.1% in October 2019.  Currently on Levemir, Novolin R, Tradjenta, metformin as an outpatient.  Follows with endocrinology. -Continue home Levemir, Tradjenta -SSI moderate qAC  GERD Prilosec as an outpatient -Continue PPI (Protonix)  Asthma Stable. -Continue albuterol prn  Hyperlipidemia Currently on Crestor as an outpatient. -Discontinued Crestor in setting of elevated LFTs  CKD stage II Baseline creatinine of 1.2-1.3. stable. Has recently had acute kidney injury,  but baseline appears to be in the CKD II range.  History of possible TIA MRI head ordered as an outpatient but never obtained. Was placed on statin at that time.   DVT prophylaxis: SCDs (holding Eliquis) Code Status:   Code Status: Full Code Family Communication: None at bedside Disposition Plan: Discharge pending GI workup   Consultants:   [facility]  Procedures:   None  Antimicrobials:  None    Subjective: Some mild RUQ aching. No other concerns.  Objective: Vitals:   07/28/18 0535 07/28/18 1715 07/28/18 2012 07/29/18 0626  BP: (!) 142/90 (!) 145/97 (!) 153/85 (!) 147/89  Pulse: 80 67 67 68  Resp:  18 18 18   Temp: 98.2 F (36.8 C) 97.8 F (36.6 C) 98.2 F (36.8 C) 98.2 F (36.8 C)  TempSrc: Oral Oral Oral Oral  SpO2: 98% 98% 98% 98%  Weight:      Height:       No intake or output data in the 24 hours ending 07/29/18 1516 Filed Weights   07/27/18 2034  Weight: 88.4 kg    Examination:  General: Well appearing, no distress   Data Reviewed: I have personally reviewed following labs and imaging studies  CBC: Recent Labs  Lab 07/27/18 1141  WBC 6.5  NEUTROABS 4.5  HGB 13.3  HCT 41.8  MCV 97.2  PLT 941   Basic Metabolic Panel: Recent Labs  Lab 07/27/18 1141 07/27/18 1554 07/28/18 0459 07/29/18 0845  NA 137 138 140 138  K 3.6 3.8 3.3* 3.5  CL 97* 100 101 101  CO2 28 28 27 26   GLUCOSE 119* 132* 114* 154*  BUN 14 14 13 17   CREATININE 1.39* 1.40* 1.43* 1.25*  CALCIUM 9.5 9.1 9.0 9.5   GFR: Estimated Creatinine Clearance: 62.5 mL/min (A) (by C-G formula based  on SCr of 1.25 mg/dL (H)). Liver Function Tests: Recent Labs  Lab 07/27/18 1141 07/27/18 1554 07/27/18 2100 07/28/18 0459 07/29/18 0845  AST 380* 351*  --  379* 351*  ALT 401* 378*  --  417* 453*  ALKPHOS 199* 178*  --  172* 186*  BILITOT 9.8* 8.9* 8.8* 9.1* 10.5*  PROT 8.3* 7.6  --  7.1 7.9  ALBUMIN 3.8 3.6  --  3.4* 3.6   Recent Labs  Lab 07/27/18 1141  LIPASE 34    No results for input(s): AMMONIA in the last 168 hours. Coagulation Profile: Recent Labs  Lab 07/27/18 1301 07/27/18 1554 07/28/18 0459 07/29/18 0647  INR 1.22 1.14 1.16 1.02   Cardiac Enzymes: No results for input(s): CKTOTAL, CKMB, CKMBINDEX, TROPONINI in the last 168 hours. BNP (last 3 results) No results for input(s): PROBNP in the last 8760 hours. HbA1C: No results for input(s): HGBA1C in the last 72 hours. CBG: Recent Labs  Lab 07/28/18 1225 07/28/18 1708 07/28/18 2135 07/29/18 0807 07/29/18 1227  GLUCAP 122* 110* 159* 153* 130*   Lipid Profile: No results for input(s): CHOL, HDL, LDLCALC, TRIG, CHOLHDL, LDLDIRECT in the last 72 hours. Thyroid Function Tests: No results for input(s): TSH, T4TOTAL, FREET4, T3FREE, THYROIDAB in the last 72 hours. Anemia Panel: No results for input(s): VITAMINB12, FOLATE, FERRITIN, TIBC, IRON, RETICCTPCT in the last 72 hours. Sepsis Labs: No results for input(s): PROCALCITON, LATICACIDVEN in the last 168 hours.  No results found for this or any previous visit (from the past 240 hour(s)).       Radiology Studies: Mr Abdomen Mrcp Wo Contrast  Result Date: 07/28/2018 CLINICAL DATA:  68 year old male with history of right-sided flank pain and abnormal liver function tests. EXAM: MRI ABDOMEN WITHOUT CONTRAST  (INCLUDING MRCP) TECHNIQUE: Multiplanar multisequence MR imaging of the abdomen was performed. Heavily T2-weighted images of the biliary and pancreatic ducts were obtained, and three-dimensional MRCP images were rendered by post processing. COMPARISON:  No prior abdominal MRI. CT the abdomen and pelvis 07/27/2018. Abdominal ultrasound 07/27/2018. FINDINGS: Comment: Per report from the technologist, the patient refused to continue part way through the examination, limiting the acquisition of images in the diagnostic sensitivity and specificity of the examination. Additionally, today's study is limited by lack of IV gadolinium for  detection and characterization of visceral and/or vascular lesions. Lower chest: Unremarkable. Hepatobiliary: No definite cystic or solid hepatic lesions are confidently identified on today's noncontrast examination. Gallbladder is moderately distended, but otherwise unremarkable in appearance. No filling defects in the gallbladder to suggest gallstones. There is severe intra and extrahepatic biliary ductal dilatation. Common bile duct measures up to 15 mm in the porta hepatis and is abruptly cut off proximally. The obstruction of the gallbladder appears extrinsic secondary to a pancreatic head mass (discussed below). Pancreas: In the head of the pancreas (axial image 22 of series 6 and coronal image 19 of series 4) there is a 4.8 x 3.3 x 5.4 cm lesion that is heterogeneous in signal intensity on T2 weighted images with multiple internal areas of T2 hyperintensity, favored to reflect the presence of innumerable small cystic areas, concerning for pancreatic neoplasm. This appears to occlude the common bile duct. Pancreatic duct does not appear occluded, and is normal in caliber. Body and tail of the pancreas are unremarkable in appearance. Spleen:  Unremarkable. Adrenals/Urinary Tract: Multiple T2 hyperintense lesions in both kidneys, incompletely characterized on today's examination, but favored to represent cysts, measuring up to 1.9 cm in the medial aspect  of the upper pole the right kidney. No hydroureteronephrosis in the visualized portions of the abdomen. Bilateral adrenal glands are normal in appearance. Stomach/Bowel: Visualized portions are unremarkable. Vascular/Lymphatic: No aneurysm identified in the visualized abdominal vasculature. No definite lymphadenopathy noted in the visualized abdomen. Other: No significant volume of ascites noted in the visualized portions of the abdomen. Small supraumbilical ventral hernia containing only omental fat. Musculoskeletal: No aggressive appearing osseous lesions are  noted in the visualized portions of the skeleton. IMPRESSION: 1. 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 At this time, this is associated with severe intra and extrahepatic biliary ductal dilatation. This is poorly evaluated on today's incomplete noncontrast abdominal MRI. Further evaluation with endoscopic ultrasound is strongly recommended for definitive diagnosis. Electronically Signed   By: Vinnie Langton M.D.   On: 07/28/2018 15:10   Mr 3d Recon At Scanner  Result Date: 07/28/2018 CLINICAL DATA:  68 year old male with history of right-sided flank pain and abnormal liver function tests. EXAM: MRI ABDOMEN WITHOUT CONTRAST  (INCLUDING MRCP) TECHNIQUE: Multiplanar multisequence MR imaging of the abdomen was performed. Heavily T2-weighted images of the biliary and pancreatic ducts were obtained, and three-dimensional MRCP images were rendered by post processing. COMPARISON:  No prior abdominal MRI. CT the abdomen and pelvis 07/27/2018. Abdominal ultrasound 07/27/2018. FINDINGS: Comment: Per report from the technologist, the patient refused to continue part way through the examination, limiting the acquisition of images in the diagnostic sensitivity and specificity of the examination. Additionally, today's study is limited by lack of IV gadolinium for detection and characterization of visceral and/or vascular lesions. Lower chest: Unremarkable. Hepatobiliary: No definite cystic or solid hepatic lesions are confidently identified on today's noncontrast examination. Gallbladder is moderately distended, but otherwise unremarkable in appearance. No filling defects in the gallbladder to suggest gallstones. There is severe intra and extrahepatic biliary ductal dilatation. Common bile duct measures up to 15 mm in the porta hepatis and is abruptly cut off proximally. The obstruction of the gallbladder appears extrinsic secondary to a pancreatic head  mass (discussed below). Pancreas: In the head of the pancreas (axial image 22 of series 6 and coronal image 19 of series 4) there is a 4.8 x 3.3 x 5.4 cm lesion that is heterogeneous in signal intensity on T2 weighted images with multiple internal areas of T2 hyperintensity, favored to reflect the presence of innumerable small cystic areas, concerning for pancreatic neoplasm. This appears to occlude the common bile duct. Pancreatic duct does not appear occluded, and is normal in caliber. Body and tail of the pancreas are unremarkable in appearance. Spleen:  Unremarkable. Adrenals/Urinary Tract: Multiple T2 hyperintense lesions in both kidneys, incompletely characterized on today's examination, but favored to represent cysts, measuring up to 1.9 cm in the medial aspect of the upper pole the right kidney. No hydroureteronephrosis in the visualized portions of the abdomen. Bilateral adrenal glands are normal in appearance. Stomach/Bowel: Visualized portions are unremarkable. Vascular/Lymphatic: No aneurysm identified in the visualized abdominal vasculature. No definite lymphadenopathy noted in the visualized abdomen. Other: No significant volume of ascites noted in the visualized portions of the abdomen. Small supraumbilical ventral hernia containing only omental fat. Musculoskeletal: No aggressive appearing osseous lesions are noted in the visualized portions of the skeleton. IMPRESSION: 1. 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 At this time, this is associated with severe intra and  extrahepatic biliary ductal dilatation. This is poorly evaluated on today's incomplete noncontrast abdominal MRI. Further evaluation with endoscopic ultrasound is strongly recommended for definitive diagnosis. Electronically Signed   By: Vinnie Langton M.D.   On: 07/28/2018 15:10   US Abdomen Limited Ruq  Result Date: 07/27/2018 CLINICAL DATA:  Elevated LFT EXAM:  ULTRASOUND ABDOMEN LIMITED RIGHT UPPER QUADRANT COMPARISON:  CT 07/27/2018 FINDINGS: Gallbladder: Moderate sludge in the gallbladder. Normal wall thickness. Negative sonographic Murphy. Common bile duct: Diameter: 10 mm Liver: Increased echogenicity. Mild intra hepatic biliary dilatation. No focal hepatic abnormality. Portal vein is patent on color Doppler imaging with normal direction of blood flow towards the liver. Isoechoic area surrounding the right kidney, no correlate on CT, may represent prominent retroperitoneal fat deposition. IMPRESSION: 1. Moderate sludge in the gallbladder without sonographic evidence for an acute cholecystitis 2. Mild intra hepatic biliary dilatation with enlarged extrahepatic common bile duct up to 1 cm. Further evaluation with MRCP as clinically indicated 3. Echogenic liver suggesting steatosis and or hepatocellular disease. Electronically Signed   By: Donavan Foil M.D.   On: 07/27/2018 20:03        Scheduled Meds: . amLODipine  5 mg Oral Daily  . hydrochlorothiazide  25 mg Oral Daily  . insulin aspart  0-15 Units Subcutaneous TID WC  . insulin detemir  15 Units Subcutaneous QHS  . linagliptin  5 mg Oral QHS  . losartan  100 mg Oral Daily  . metoprolol succinate  50 mg Oral Daily  . pantoprazole  40 mg Oral Daily   Continuous Infusions:   LOS: 0 days     Cordelia Poche, MD [facility] 07/29/2018, 3:16 PM  If 7PM-7AM, please contact night-coverage [email or web address]

## 2018-07-29 NOTE — Plan of Care (Signed)

## 2018-07-29 NOTE — Progress Notes (Signed)
Progress Note   Subjective  Chief Complaint: Elevated LFTs, right upper quadrant pain, MRCP with HOP mass  This morning, the patient is in fairly good spirits.  He is on the phone with one of his family members describing what the events are to come.  He is ready to go home and denies any new complaints.  He is aware of outpatient work-up that is needed.   Objective   Vital signs in last 24 hours: Temp:  [97.8 F (36.6 C)-98.2 F (36.8 C)] 98.2 F (36.8 C) (01/01 0626) Pulse Rate:  [67-68] 68 (01/01 0626) Resp:  [18] 18 (01/01 0626) BP: (145-153)/(85-97) 147/89 (01/01 0626) SpO2:  [98 %] 98 % (01/01 0626) Last BM Date: 07/28/18 General:    AA male in NAD Heart:  Regular rate and rhythm; no murmurs Lungs: Respirations even and unlabored, lungs CTA bilaterally Abdomen:  Soft, nontender and nondistended. Normal bowel sounds. Extremities:  Without edema. Neurologic:  Alert and oriented,  grossly normal neurologically. Psych:  Cooperative. Normal mood and affect.  Intake/Output from previous day: 12/31 0701 - 01/01 0700 In: 220 [P.O.:220] Out: -   Lab Results: Recent Labs    07/27/18 1141  WBC 6.5  HGB 13.3  HCT 41.8  PLT 325   BMET Recent Labs    07/27/18 1141 07/27/18 1554 07/28/18 0459  NA 137 138 140  K 3.6 3.8 3.3*  CL 97* 100 101  CO2 28 28 27   GLUCOSE 119* 132* 114*  BUN 14 14 13   CREATININE 1.39* 1.40* 1.43*  CALCIUM 9.5 9.1 9.0   LFT Recent Labs    07/27/18 2100 07/28/18 0459  PROT  --  7.1  ALBUMIN  --  3.4*  AST  --  379*  ALT  --  417*  ALKPHOS  --  172*  BILITOT 8.8* 9.1*  BILIDIR 4.8*  --   IBILI 4.0*  --    PT/INR Recent Labs    07/28/18 0459 07/29/18 0647  LABPROT 14.7 13.3  INR 1.16 1.02    Studies/Results: Mr Abdomen Mrcp Wo Contrast  Result Date: 07/28/2018 CLINICAL DATA:  68 year old male with history of right-sided flank pain and abnormal liver function tests. EXAM: MRI ABDOMEN WITHOUT CONTRAST  (INCLUDING MRCP)  TECHNIQUE: Multiplanar multisequence MR imaging of the abdomen was performed. Heavily T2-weighted images of the biliary and pancreatic ducts were obtained, and three-dimensional MRCP images were rendered by post processing. COMPARISON:  No prior abdominal MRI. CT the abdomen and pelvis 07/27/2018. Abdominal ultrasound 07/27/2018. FINDINGS: Comment: Per report from the technologist, the patient refused to continue part way through the examination, limiting the acquisition of images in the diagnostic sensitivity and specificity of the examination. Additionally, today's study is limited by lack of IV gadolinium for detection and characterization of visceral and/or vascular lesions. Lower chest: Unremarkable. Hepatobiliary: No definite cystic or solid hepatic lesions are confidently identified on today's noncontrast examination. Gallbladder is moderately distended, but otherwise unremarkable in appearance. No filling defects in the gallbladder to suggest gallstones. There is severe intra and extrahepatic biliary ductal dilatation. Common bile duct measures up to 15 mm in the porta hepatis and is abruptly cut off proximally. The obstruction of the gallbladder appears extrinsic secondary to a pancreatic head mass (discussed below). Pancreas: In the head of the pancreas (axial image 22 of series 6 and coronal image 19 of series 4) there is a 4.8 x 3.3 x 5.4 cm lesion that is heterogeneous in signal intensity on T2 weighted  images with multiple internal areas of T2 hyperintensity, favored to reflect the presence of innumerable small cystic areas, concerning for pancreatic neoplasm. This appears to occlude the common bile duct. Pancreatic duct does not appear occluded, and is normal in caliber. Body and tail of the pancreas are unremarkable in appearance. Spleen:  Unremarkable. Adrenals/Urinary Tract: Multiple T2 hyperintense lesions in both kidneys, incompletely characterized on today's examination, but favored to represent  cysts, measuring up to 1.9 cm in the medial aspect of the upper pole the right kidney. No hydroureteronephrosis in the visualized portions of the abdomen. Bilateral adrenal glands are normal in appearance. Stomach/Bowel: Visualized portions are unremarkable. Vascular/Lymphatic: No aneurysm identified in the visualized abdominal vasculature. No definite lymphadenopathy noted in the visualized abdomen. Other: No significant volume of ascites noted in the visualized portions of the abdomen. Small supraumbilical ventral hernia containing only omental fat. Musculoskeletal: No aggressive appearing osseous lesions are noted in the visualized portions of the skeleton. IMPRESSION: 1. 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 At this time, this is associated with severe intra and extrahepatic biliary ductal dilatation. This is poorly evaluated on today's incomplete noncontrast abdominal MRI. Further evaluation with endoscopic ultrasound is strongly recommended for definitive diagnosis. Electronically Signed   By: Vinnie Langton M.D.   On: 07/28/2018 15:10   Mr 3d Recon At Scanner  Result Date: 07/28/2018 CLINICAL DATA:  68 year old male with history of right-sided flank pain and abnormal liver function tests. EXAM: MRI ABDOMEN WITHOUT CONTRAST  (INCLUDING MRCP) TECHNIQUE: Multiplanar multisequence MR imaging of the abdomen was performed. Heavily T2-weighted images of the biliary and pancreatic ducts were obtained, and three-dimensional MRCP images were rendered by post processing. COMPARISON:  No prior abdominal MRI. CT the abdomen and pelvis 07/27/2018. Abdominal ultrasound 07/27/2018. FINDINGS: Comment: Per report from the technologist, the patient refused to continue part way through the examination, limiting the acquisition of images in the diagnostic sensitivity and specificity of the examination. Additionally, today's study is limited by lack of  IV gadolinium for detection and characterization of visceral and/or vascular lesions. Lower chest: Unremarkable. Hepatobiliary: No definite cystic or solid hepatic lesions are confidently identified on today's noncontrast examination. Gallbladder is moderately distended, but otherwise unremarkable in appearance. No filling defects in the gallbladder to suggest gallstones. There is severe intra and extrahepatic biliary ductal dilatation. Common bile duct measures up to 15 mm in the porta hepatis and is abruptly cut off proximally. The obstruction of the gallbladder appears extrinsic secondary to a pancreatic head mass (discussed below). Pancreas: In the head of the pancreas (axial image 22 of series 6 and coronal image 19 of series 4) there is a 4.8 x 3.3 x 5.4 cm lesion that is heterogeneous in signal intensity on T2 weighted images with multiple internal areas of T2 hyperintensity, favored to reflect the presence of innumerable small cystic areas, concerning for pancreatic neoplasm. This appears to occlude the common bile duct. Pancreatic duct does not appear occluded, and is normal in caliber. Body and tail of the pancreas are unremarkable in appearance. Spleen:  Unremarkable. Adrenals/Urinary Tract: Multiple T2 hyperintense lesions in both kidneys, incompletely characterized on today's examination, but favored to represent cysts, measuring up to 1.9 cm in the medial aspect of the upper pole the right kidney. No hydroureteronephrosis in the visualized portions of the abdomen. Bilateral adrenal glands are normal in appearance. Stomach/Bowel: Visualized portions are unremarkable. Vascular/Lymphatic: No aneurysm identified in  the visualized abdominal vasculature. No definite lymphadenopathy noted in the visualized abdomen. Other: No significant volume of ascites noted in the visualized portions of the abdomen. Small supraumbilical ventral hernia containing only omental fat. Musculoskeletal: No aggressive appearing  osseous lesions are noted in the visualized portions of the skeleton. IMPRESSION: 1. 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 At this time, this is associated with severe intra and extrahepatic biliary ductal dilatation. This is poorly evaluated on today's incomplete noncontrast abdominal MRI. Further evaluation with endoscopic ultrasound is strongly recommended for definitive diagnosis. Electronically Signed   By: Vinnie Langton M.D.   On: 07/28/2018 15:10   Ct Renal Stone Study  Result Date: 07/27/2018 CLINICAL DATA:  Right flank pain and discolored urine for 4-5 days. EXAM: CT ABDOMEN AND PELVIS WITHOUT CONTRAST TECHNIQUE: Multidetector CT imaging of the abdomen and pelvis was performed following the standard protocol without IV contrast. COMPARISON:  None. FINDINGS: Lower chest: There is cardiomegaly. No pleural or pericardial effusion. Lung bases are clear. Hepatobiliary: No focal liver abnormality is seen. No gallstones, gallbladder wall thickening, or biliary dilatation. Pancreas: Unremarkable. No pancreatic ductal dilatation or surrounding inflammatory changes. Spleen: Normal in size without focal abnormality. Adrenals/Urinary Tract: Adrenal glands are unremarkable. Kidneys are normal, without renal calculi, focal solid lesion, or hydronephrosis. Small right renal cyst is noted. Bladder is unremarkable. Stomach/Bowel: Surgical anastomosis at the junction of the descending and sigmoid colon noted. Scattered diverticulosis is seen throughout the colon but there is no evidence of diverticulitis. The appendix is not visualized and may have been removed. Small hiatal hernia is noted. The stomach is otherwise unremarkable. Small bowel loops appear normal. Vascular/Lymphatic: Aortic atherosclerosis. No enlarged abdominal or pelvic lymph nodes. Reproductive: Prostate gland is enlarged. Other: Small fat containing supraumbilical hernia is  identified. Musculoskeletal: No fracture or worrisome lesion. Facet degenerative disease results in 0.7 cm anterolisthesis L5 on S1. IMPRESSION: Negative for urinary tract stone. No acute abnormality abdomen or pelvis. Cardiomegaly. Atherosclerosis. Diverticulosis without diverticulitis. Fat containing supraumbilical hernia. Lower lumbar spondylosis. Electronically Signed   By: Inge Rise M.D.   On: 07/27/2018 12:39   Vas US Aorta/ivc/iliacs  Result Date: 07/27/2018 ABDOMINAL AORTA STUDY Indications: Patient c/o right flank discomfort. Risk Factors: Hypertension, hyperlipidemia, Diabetes, past history of smoking. Limitations: Air/bowel gas.  Comparison Study: 06/28/14 at [facility], visceral duplex showed no AAA, but did show                   increased left common iliac artery size at 1.5 cm x 1.6 cm,                   and elevated velocity of 229 cm/sec. Performing Technologist: [employer] BS, RVT, RDCS  Examination Guidelines: A complete evaluation includes B-mode imaging, spectral Doppler, color Doppler, and power Doppler as needed of all accessible portions of each vessel. Bilateral testing is considered an integral part of a complete examination. Limited examinations for reoccurring indications may be performed as noted.  Abdominal Aorta Findings: +-------------+-------+----------+----------+--------+--------+--------+ Location     AP (cm)Trans (cm)PSV (cm/s)WaveformThrombusComments +-------------+-------+----------+----------+--------+--------+--------+ Proximal     2.10   2.20      110                                +-------------+-------+----------+----------+--------+--------+--------+ Mid          1.90  1.90      67                                 +-------------+-------+----------+----------+--------+--------+--------+ Distal       1.80   2.00      43                                 +-------------+-------+----------+----------+--------+--------+--------+ RT CIA  Prox  1.6    1.7       101                                +-------------+-------+----------+----------+--------+--------+--------+ RT CIA Mid                    76                                 +-------------+-------+----------+----------+--------+--------+--------+ RT CIA Distal                 98                                 +-------------+-------+----------+----------+--------+--------+--------+ RT EIA Prox  1.0    1.1       104                                +-------------+-------+----------+----------+--------+--------+--------+ RT EIA Mid                    77                                 +-------------+-------+----------+----------+--------+--------+--------+ RT EIA Distal                 72                                 +-------------+-------+----------+----------+--------+--------+--------+ LT CIA Prox  1.6    1.6       153                                +-------------+-------+----------+----------+--------+--------+--------+ LT CIA Mid                    219                                +-------------+-------+----------+----------+--------+--------+--------+ LT EIA Prox  1.1    1.1       74                                 +-------------+-------+----------+----------+--------+--------+--------+ LT EIA Mid                    87                                 +-------------+-------+----------+----------+--------+--------+--------+  LT EIA Distal                 73                                 +-------------+-------+----------+----------+--------+--------+--------+  Visualization of the Left CIA Distal artery was limited. Limited imaging of the right flank reveals an enlarged gall bladder, measuring 12 cm in length and 4.5 cm in width (normal values are respectively 7-10 cm long, and 2.8-3.9 wide). IVC/Iliac Findings: +--------+------+--------+--------+   IVC   PatentThrombusComments  +--------+------+--------+--------+ IVC Proxpatent                 +--------+------+--------+--------+  Summary: Abdominal Aorta: No evidence of an abdominal aortic aneurysm was visualized. 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 The largest aortic measurement is 2.2 cm. 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  *See table(s) above for measurements and observations. Suggest follow up study in 12 months.  Electronically signed by Ida Rogue MD on 07/27/2018 at 10:27:46 PM.    Final    US Abdomen Limited Ruq  Result Date: 07/27/2018 CLINICAL DATA:  Elevated LFT EXAM: ULTRASOUND ABDOMEN LIMITED RIGHT UPPER QUADRANT COMPARISON:  CT 07/27/2018 FINDINGS: Gallbladder: Moderate sludge in the gallbladder. Normal wall thickness. Negative sonographic Murphy. Common bile duct: Diameter: 10 mm Liver: Increased echogenicity. Mild intra hepatic biliary dilatation. No focal hepatic abnormality. Portal vein is patent on color Doppler imaging with normal direction of blood flow towards the liver. Isoechoic area surrounding the right kidney, no correlate on CT, may represent prominent retroperitoneal fat deposition. IMPRESSION: 1. Moderate sludge in the gallbladder without sonographic evidence for an acute cholecystitis 2. Mild intra hepatic biliary dilatation with enlarged extrahepatic common bile duct up to 1 cm. Further evaluation with MRCP as clinically indicated 3. Echogenic liver suggesting steatosis and or hepatocellular disease. Electronically Signed   By: Donavan Foil M.D.   On: 07/27/2018 20:03       Assessment / Plan:   Assessment: 1.  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  A. fib on anticoagulation  Plan: 1.  Continue to monitor LFTs, these were not ordered for this morning, so I repeated CMP, if stable or downtrending would be okay to discharge home with expedited outpatient work-up including EUS with FNA 2.  Again will need to hold Xarelto for at least 2 days prior to EUS with FNA, currently on hold in the event that he has uptrending labs and will need expedited EUS with ERCP if decompression of CBD needed 3.  Again patient is okay for discharge today as long as his LFTs are trending down, will await labs.  Thank you for kind consultation.   LOS: 0 days   Levin Erp  07/29/2018, 8:38 AM

## 2018-07-29 NOTE — H&P (View-Only) (Signed)
Progress Note   Subjective  Chief Complaint: Elevated LFTs, right upper quadrant pain, MRCP with HOP mass  This morning, the patient is in fairly good spirits.  He is on the phone with one of his family members describing what the events are to come.  He is ready to go home and denies any new complaints.  He is aware of outpatient work-up that is needed.   Objective   Vital signs in last 24 hours: Temp:  [97.8 F (36.6 C)-98.2 F (36.8 C)] 98.2 F (36.8 C) (01/01 0626) Pulse Rate:  [67-68] 68 (01/01 0626) Resp:  [18] 18 (01/01 0626) BP: (145-153)/(85-97) 147/89 (01/01 0626) SpO2:  [98 %] 98 % (01/01 0626) Last BM Date: 07/28/18 General:    AA male in NAD Heart:  Regular rate and rhythm; no murmurs Lungs: Respirations even and unlabored, lungs CTA bilaterally Abdomen:  Soft, nontender and nondistended. Normal bowel sounds. Extremities:  Without edema. Neurologic:  Alert and oriented,  grossly normal neurologically. Psych:  Cooperative. Normal mood and affect.  Intake/Output from previous day: 12/31 0701 - 01/01 0700 In: 220 [P.O.:220] Out: -   Lab Results: Recent Labs    07/27/18 1141  WBC 6.5  HGB 13.3  HCT 41.8  PLT 325   BMET Recent Labs    07/27/18 1141 07/27/18 1554 07/28/18 0459  NA 137 138 140  K 3.6 3.8 3.3*  CL 97* 100 101  CO2 28 28 27   GLUCOSE 119* 132* 114*  BUN 14 14 13   CREATININE 1.39* 1.40* 1.43*  CALCIUM 9.5 9.1 9.0   LFT Recent Labs    07/27/18 2100 07/28/18 0459  PROT  --  7.1  ALBUMIN  --  3.4*  AST  --  379*  ALT  --  417*  ALKPHOS  --  172*  BILITOT 8.8* 9.1*  BILIDIR 4.8*  --   IBILI 4.0*  --    PT/INR Recent Labs    07/28/18 0459 07/29/18 0647  LABPROT 14.7 13.3  INR 1.16 1.02    Studies/Results: Mr Abdomen Mrcp Wo Contrast  Result Date: 07/28/2018 CLINICAL DATA:  68 year old male with history of right-sided flank pain and abnormal liver function tests. EXAM: MRI ABDOMEN WITHOUT CONTRAST  (INCLUDING MRCP)  TECHNIQUE: Multiplanar multisequence MR imaging of the abdomen was performed. Heavily T2-weighted images of the biliary and pancreatic ducts were obtained, and three-dimensional MRCP images were rendered by post processing. COMPARISON:  No prior abdominal MRI. CT the abdomen and pelvis 07/27/2018. Abdominal ultrasound 07/27/2018. FINDINGS: Comment: Per report from the technologist, the patient refused to continue part way through the examination, limiting the acquisition of images in the diagnostic sensitivity and specificity of the examination. Additionally, today's study is limited by lack of IV gadolinium for detection and characterization of visceral and/or vascular lesions. Lower chest: Unremarkable. Hepatobiliary: No definite cystic or solid hepatic lesions are confidently identified on today's noncontrast examination. Gallbladder is moderately distended, but otherwise unremarkable in appearance. No filling defects in the gallbladder to suggest gallstones. There is severe intra and extrahepatic biliary ductal dilatation. Common bile duct measures up to 15 mm in the porta hepatis and is abruptly cut off proximally. The obstruction of the gallbladder appears extrinsic secondary to a pancreatic head mass (discussed below). Pancreas: In the head of the pancreas (axial image 22 of series 6 and coronal image 19 of series 4) there is a 4.8 x 3.3 x 5.4 cm lesion that is heterogeneous in signal intensity on T2 weighted  images with multiple internal areas of T2 hyperintensity, favored to reflect the presence of innumerable small cystic areas, concerning for pancreatic neoplasm. This appears to occlude the common bile duct. Pancreatic duct does not appear occluded, and is normal in caliber. Body and tail of the pancreas are unremarkable in appearance. Spleen:  Unremarkable. Adrenals/Urinary Tract: Multiple T2 hyperintense lesions in both kidneys, incompletely characterized on today's examination, but favored to represent  cysts, measuring up to 1.9 cm in the medial aspect of the upper pole the right kidney. No hydroureteronephrosis in the visualized portions of the abdomen. Bilateral adrenal glands are normal in appearance. Stomach/Bowel: Visualized portions are unremarkable. Vascular/Lymphatic: No aneurysm identified in the visualized abdominal vasculature. No definite lymphadenopathy noted in the visualized abdomen. Other: No significant volume of ascites noted in the visualized portions of the abdomen. Small supraumbilical ventral hernia containing only omental fat. Musculoskeletal: No aggressive appearing osseous lesions are noted in the visualized portions of the skeleton. IMPRESSION: 1. 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 At this time, this is associated with severe intra and extrahepatic biliary ductal dilatation. This is poorly evaluated on today's incomplete noncontrast abdominal MRI. Further evaluation with endoscopic ultrasound is strongly recommended for definitive diagnosis. Electronically Signed   By: Vinnie Langton M.D.   On: 07/28/2018 15:10   Mr 3d Recon At Scanner  Result Date: 07/28/2018 CLINICAL DATA:  68 year old male with history of right-sided flank pain and abnormal liver function tests. EXAM: MRI ABDOMEN WITHOUT CONTRAST  (INCLUDING MRCP) TECHNIQUE: Multiplanar multisequence MR imaging of the abdomen was performed. Heavily T2-weighted images of the biliary and pancreatic ducts were obtained, and three-dimensional MRCP images were rendered by post processing. COMPARISON:  No prior abdominal MRI. CT the abdomen and pelvis 07/27/2018. Abdominal ultrasound 07/27/2018. FINDINGS: Comment: Per report from the technologist, the patient refused to continue part way through the examination, limiting the acquisition of images in the diagnostic sensitivity and specificity of the examination. Additionally, today's study is limited by lack of  IV gadolinium for detection and characterization of visceral and/or vascular lesions. Lower chest: Unremarkable. Hepatobiliary: No definite cystic or solid hepatic lesions are confidently identified on today's noncontrast examination. Gallbladder is moderately distended, but otherwise unremarkable in appearance. No filling defects in the gallbladder to suggest gallstones. There is severe intra and extrahepatic biliary ductal dilatation. Common bile duct measures up to 15 mm in the porta hepatis and is abruptly cut off proximally. The obstruction of the gallbladder appears extrinsic secondary to a pancreatic head mass (discussed below). Pancreas: In the head of the pancreas (axial image 22 of series 6 and coronal image 19 of series 4) there is a 4.8 x 3.3 x 5.4 cm lesion that is heterogeneous in signal intensity on T2 weighted images with multiple internal areas of T2 hyperintensity, favored to reflect the presence of innumerable small cystic areas, concerning for pancreatic neoplasm. This appears to occlude the common bile duct. Pancreatic duct does not appear occluded, and is normal in caliber. Body and tail of the pancreas are unremarkable in appearance. Spleen:  Unremarkable. Adrenals/Urinary Tract: Multiple T2 hyperintense lesions in both kidneys, incompletely characterized on today's examination, but favored to represent cysts, measuring up to 1.9 cm in the medial aspect of the upper pole the right kidney. No hydroureteronephrosis in the visualized portions of the abdomen. Bilateral adrenal glands are normal in appearance. Stomach/Bowel: Visualized portions are unremarkable. Vascular/Lymphatic: No aneurysm identified in  the visualized abdominal vasculature. No definite lymphadenopathy noted in the visualized abdomen. Other: No significant volume of ascites noted in the visualized portions of the abdomen. Small supraumbilical ventral hernia containing only omental fat. Musculoskeletal: No aggressive appearing  osseous lesions are noted in the visualized portions of the skeleton. IMPRESSION: 1. 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 At this time, this is associated with severe intra and extrahepatic biliary ductal dilatation. This is poorly evaluated on today's incomplete noncontrast abdominal MRI. Further evaluation with endoscopic ultrasound is strongly recommended for definitive diagnosis. Electronically Signed   By: Vinnie Langton M.D.   On: 07/28/2018 15:10   Ct Renal Stone Study  Result Date: 07/27/2018 CLINICAL DATA:  Right flank pain and discolored urine for 4-5 days. EXAM: CT ABDOMEN AND PELVIS WITHOUT CONTRAST TECHNIQUE: Multidetector CT imaging of the abdomen and pelvis was performed following the standard protocol without IV contrast. COMPARISON:  None. FINDINGS: Lower chest: There is cardiomegaly. No pleural or pericardial effusion. Lung bases are clear. Hepatobiliary: No focal liver abnormality is seen. No gallstones, gallbladder wall thickening, or biliary dilatation. Pancreas: Unremarkable. No pancreatic ductal dilatation or surrounding inflammatory changes. Spleen: Normal in size without focal abnormality. Adrenals/Urinary Tract: Adrenal glands are unremarkable. Kidneys are normal, without renal calculi, focal solid lesion, or hydronephrosis. Small right renal cyst is noted. Bladder is unremarkable. Stomach/Bowel: Surgical anastomosis at the junction of the descending and sigmoid colon noted. Scattered diverticulosis is seen throughout the colon but there is no evidence of diverticulitis. The appendix is not visualized and may have been removed. Small hiatal hernia is noted. The stomach is otherwise unremarkable. Small bowel loops appear normal. Vascular/Lymphatic: Aortic atherosclerosis. No enlarged abdominal or pelvic lymph nodes. Reproductive: Prostate gland is enlarged. Other: Small fat containing supraumbilical hernia is  identified. Musculoskeletal: No fracture or worrisome lesion. Facet degenerative disease results in 0.7 cm anterolisthesis L5 on S1. IMPRESSION: Negative for urinary tract stone. No acute abnormality abdomen or pelvis. Cardiomegaly. Atherosclerosis. Diverticulosis without diverticulitis. Fat containing supraumbilical hernia. Lower lumbar spondylosis. Electronically Signed   By: Inge Rise M.D.   On: 07/27/2018 12:39   Vas US Aorta/ivc/iliacs  Result Date: 07/27/2018 ABDOMINAL AORTA STUDY Indications: Patient c/o right flank discomfort. Risk Factors: Hypertension, hyperlipidemia, Diabetes, past history of smoking. Limitations: Air/bowel gas.  Comparison Study: 06/28/14 at [facility], visceral duplex showed no AAA, but did show                   increased left common iliac artery size at 1.5 cm x 1.6 cm,                   and elevated velocity of 229 cm/sec. Performing Technologist: [employer] BS, RVT, RDCS  Examination Guidelines: A complete evaluation includes B-mode imaging, spectral Doppler, color Doppler, and power Doppler as needed of all accessible portions of each vessel. Bilateral testing is considered an integral part of a complete examination. Limited examinations for reoccurring indications may be performed as noted.  Abdominal Aorta Findings: +-------------+-------+----------+----------+--------+--------+--------+ Location     AP (cm)Trans (cm)PSV (cm/s)WaveformThrombusComments +-------------+-------+----------+----------+--------+--------+--------+ Proximal     2.10   2.20      110                                +-------------+-------+----------+----------+--------+--------+--------+ Mid          1.90  1.90      67                                 +-------------+-------+----------+----------+--------+--------+--------+ Distal       1.80   2.00      43                                 +-------------+-------+----------+----------+--------+--------+--------+ RT CIA  Prox  1.6    1.7       101                                +-------------+-------+----------+----------+--------+--------+--------+ RT CIA Mid                    76                                 +-------------+-------+----------+----------+--------+--------+--------+ RT CIA Distal                 98                                 +-------------+-------+----------+----------+--------+--------+--------+ RT EIA Prox  1.0    1.1       104                                +-------------+-------+----------+----------+--------+--------+--------+ RT EIA Mid                    77                                 +-------------+-------+----------+----------+--------+--------+--------+ RT EIA Distal                 72                                 +-------------+-------+----------+----------+--------+--------+--------+ LT CIA Prox  1.6    1.6       153                                +-------------+-------+----------+----------+--------+--------+--------+ LT CIA Mid                    219                                +-------------+-------+----------+----------+--------+--------+--------+ LT EIA Prox  1.1    1.1       74                                 +-------------+-------+----------+----------+--------+--------+--------+ LT EIA Mid                    87                                 +-------------+-------+----------+----------+--------+--------+--------+  LT EIA Distal                 73                                 +-------------+-------+----------+----------+--------+--------+--------+  Visualization of the Left CIA Distal artery was limited. Limited imaging of the right flank reveals an enlarged gall bladder, measuring 12 cm in length and 4.5 cm in width (normal values are respectively 7-10 cm long, and 2.8-3.9 wide). IVC/Iliac Findings: +--------+------+--------+--------+   IVC   PatentThrombusComments  +--------+------+--------+--------+ IVC Proxpatent                 +--------+------+--------+--------+  Summary: Abdominal Aorta: No evidence of an abdominal aortic aneurysm was visualized. 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 The largest aortic measurement is 2.2 cm. 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  *See table(s) above for measurements and observations. Suggest follow up study in 12 months.  Electronically signed by Ida Rogue MD on 07/27/2018 at 10:27:46 PM.    Final    US Abdomen Limited Ruq  Result Date: 07/27/2018 CLINICAL DATA:  Elevated LFT EXAM: ULTRASOUND ABDOMEN LIMITED RIGHT UPPER QUADRANT COMPARISON:  CT 07/27/2018 FINDINGS: Gallbladder: Moderate sludge in the gallbladder. Normal wall thickness. Negative sonographic Murphy. Common bile duct: Diameter: 10 mm Liver: Increased echogenicity. Mild intra hepatic biliary dilatation. No focal hepatic abnormality. Portal vein is patent on color Doppler imaging with normal direction of blood flow towards the liver. Isoechoic area surrounding the right kidney, no correlate on CT, may represent prominent retroperitoneal fat deposition. IMPRESSION: 1. Moderate sludge in the gallbladder without sonographic evidence for an acute cholecystitis 2. Mild intra hepatic biliary dilatation with enlarged extrahepatic common bile duct up to 1 cm. Further evaluation with MRCP as clinically indicated 3. Echogenic liver suggesting steatosis and or hepatocellular disease. Electronically Signed   By: Donavan Foil M.D.   On: 07/27/2018 20:03       Assessment / Plan:   Assessment: 1.  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  A. fib on anticoagulation  Plan: 1.  Continue to monitor LFTs, these were not ordered for this morning, so I repeated CMP, if stable or downtrending would be okay to discharge home with expedited outpatient work-up including EUS with FNA 2.  Again will need to hold Xarelto for at least 2 days prior to EUS with FNA, currently on hold in the event that he has uptrending labs and will need expedited EUS with ERCP if decompression of CBD needed 3.  Again patient is okay for discharge today as long as his LFTs are trending down, will await labs.  Thank you for kind consultation.   LOS: 0 days   Levin Erp  07/29/2018, 8:38 AM

## 2018-07-30 ENCOUNTER — Encounter (HOSPITAL_COMMUNITY): Payer: Self-pay | Admitting: Certified Registered Nurse Anesthetist

## 2018-07-30 ENCOUNTER — Observation Stay (HOSPITAL_COMMUNITY): Payer: Medicare Other | Admitting: Certified Registered Nurse Anesthetist

## 2018-07-30 ENCOUNTER — Encounter (HOSPITAL_COMMUNITY)
Admission: EM | Disposition: A | Discharge status: Home / Self Care | Payer: Self-pay | Source: Home / Self Care | Attending: Emergency Medicine

## 2018-07-30 ENCOUNTER — Observation Stay (HOSPITAL_COMMUNITY): Payer: Medicare Other

## 2018-07-30 DIAGNOSIS — K838 Other specified diseases of biliary tract: Secondary | ICD-10-CM | POA: Diagnosis not present

## 2018-07-30 DIAGNOSIS — E1122 Type 2 diabetes mellitus with diabetic chronic kidney disease: Secondary | ICD-10-CM | POA: Diagnosis not present

## 2018-07-30 DIAGNOSIS — K831 Obstruction of bile duct: Secondary | ICD-10-CM

## 2018-07-30 DIAGNOSIS — R8569 Abnormal cytological findings in specimens from other digestive organs and abdominal cavity: Secondary | ICD-10-CM | POA: Diagnosis not present

## 2018-07-30 DIAGNOSIS — I10 Essential (primary) hypertension: Secondary | ICD-10-CM | POA: Diagnosis not present

## 2018-07-30 DIAGNOSIS — I700 Atherosclerosis of aorta: Secondary | ICD-10-CM | POA: Diagnosis not present

## 2018-07-30 DIAGNOSIS — Z7901 Long term (current) use of anticoagulants: Secondary | ICD-10-CM | POA: Diagnosis not present

## 2018-07-30 DIAGNOSIS — R932 Abnormal findings on diagnostic imaging of liver and biliary tract: Secondary | ICD-10-CM | POA: Diagnosis not present

## 2018-07-30 DIAGNOSIS — I480 Paroxysmal atrial fibrillation: Secondary | ICD-10-CM | POA: Diagnosis not present

## 2018-07-30 DIAGNOSIS — I4891 Unspecified atrial fibrillation: Secondary | ICD-10-CM | POA: Diagnosis not present

## 2018-07-30 DIAGNOSIS — R7989 Other specified abnormal findings of blood chemistry: Secondary | ICD-10-CM | POA: Diagnosis not present

## 2018-07-30 DIAGNOSIS — N182 Chronic kidney disease, stage 2 (mild): Secondary | ICD-10-CM | POA: Diagnosis not present

## 2018-07-30 DIAGNOSIS — R17 Unspecified jaundice: Secondary | ICD-10-CM | POA: Diagnosis not present

## 2018-07-30 DIAGNOSIS — I129 Hypertensive chronic kidney disease with stage 1 through stage 4 chronic kidney disease, or unspecified chronic kidney disease: Secondary | ICD-10-CM | POA: Diagnosis not present

## 2018-07-30 DIAGNOSIS — E785 Hyperlipidemia, unspecified: Secondary | ICD-10-CM | POA: Diagnosis not present

## 2018-07-30 DIAGNOSIS — K219 Gastro-esophageal reflux disease without esophagitis: Secondary | ICD-10-CM | POA: Diagnosis not present

## 2018-07-30 DIAGNOSIS — R945 Abnormal results of liver function studies: Secondary | ICD-10-CM | POA: Diagnosis not present

## 2018-07-30 DIAGNOSIS — K869 Disease of pancreas, unspecified: Secondary | ICD-10-CM | POA: Diagnosis not present

## 2018-07-30 DIAGNOSIS — Z9689 Presence of other specified functional implants: Secondary | ICD-10-CM | POA: Diagnosis not present

## 2018-07-30 HISTORY — PX: ERCP: SHX5425

## 2018-07-30 HISTORY — PX: BILIARY STENT PLACEMENT: SHX5538

## 2018-07-30 LAB — GLUCOSE, CAPILLARY
Glucose-Capillary: 95 mg/dL (ref 70–99)
Glucose-Capillary: 173 mg/dL — ABNORMAL HIGH (ref 70–99)
Glucose-Capillary: 156 mg/dL — ABNORMAL HIGH (ref 70–99)
Glucose-Capillary: 244 mg/dL — ABNORMAL HIGH (ref 70–99)

## 2018-07-30 LAB — COMPREHENSIVE METABOLIC PANEL
ALK PHOS: 205 U/L — AB (ref 38–126)
ALT: 462 U/L — ABNORMAL HIGH (ref 0–44)
AST: 325 U/L — ABNORMAL HIGH (ref 15–41)
Albumin: 3.5 g/dL (ref 3.5–5.0)
Anion gap: 10 (ref 5–15)
BILIRUBIN TOTAL: 12 mg/dL — AB (ref 0.3–1.2)
BUN: 16 mg/dL (ref 8–23)
CO2: 28 mmol/L (ref 22–32)
Calcium: 9.2 mg/dL (ref 8.9–10.3)
Chloride: 102 mmol/L (ref 98–111)
Creatinine, Ser: 1.46 mg/dL — ABNORMAL HIGH (ref 0.61–1.24)
GFR calc Af Amer: 56 mL/min — ABNORMAL LOW (ref >60)
GFR calc non Af Amer: 48 mL/min — ABNORMAL LOW (ref >60)
Glucose, Bld: 183 mg/dL — ABNORMAL HIGH (ref 70–99)
Potassium: 3.4 mmol/L — ABNORMAL LOW (ref 3.5–5.1)
Sodium: 140 mmol/L (ref 135–145)
TOTAL PROTEIN: 8 g/dL (ref 6.5–8.1)

## 2018-07-30 SURGERY — ERCP, WITH INTERVENTION IF INDICATED
Anesthesia: General

## 2018-07-30 MED ORDER — EPHEDRINE SULFATE-NACL 50-0.9 MG/10ML-% IV SOSY
PREFILLED_SYRINGE | INTRAVENOUS | Status: DC | PRN
  Administered 2018-07-30: 10 mg via INTRAVENOUS

## 2018-07-30 MED ORDER — LIDOCAINE 2% (20 MG/ML) 5 ML SYRINGE
INTRAMUSCULAR | Status: DC | PRN
  Administered 2018-07-30: 100 mg via INTRAVENOUS

## 2018-07-30 MED ORDER — INDOMETHACIN 50 MG RE SUPP: 100.0000 mg | Freq: Once | RECTAL | Status: DC

## 2018-07-30 MED ORDER — CIPROFLOXACIN IN D5W 400 MG/200ML IV SOLN
INTRAVENOUS | Status: DC | PRN
  Administered 2018-07-30: 400 mg via INTRAVENOUS

## 2018-07-30 MED ORDER — FENTANYL CITRATE (PF) 100 MCG/2ML IJ SOLN: 25.0000 ug | INTRAMUSCULAR | Status: DC | PRN

## 2018-07-30 MED ORDER — INDOMETHACIN 50 MG RE SUPP
RECTAL | Status: AC
  Filled 2018-07-30: qty 2

## 2018-07-30 MED ORDER — IOPAMIDOL (ISOVUE-300) INJECTION 61%
INTRAVENOUS | Status: DC | PRN
  Administered 2018-07-30: 25 mL via INTRAVENOUS

## 2018-07-30 MED ORDER — SUCCINYLCHOLINE CHLORIDE 200 MG/10ML IV SOSY
PREFILLED_SYRINGE | INTRAVENOUS | Status: DC | PRN
  Administered 2018-07-30: 100 mg via INTRAVENOUS

## 2018-07-30 MED ORDER — PHENYLEPHRINE 40 MCG/ML (10ML) SYRINGE FOR IV PUSH (FOR BLOOD PRESSURE SUPPORT)
PREFILLED_SYRINGE | INTRAVENOUS | Status: DC | PRN
  Administered 2018-07-30 (×3): 80 ug via INTRAVENOUS

## 2018-07-30 MED ORDER — DIPHENHYDRAMINE HCL 25 MG PO CAPS
25.0000 mg | ORAL_CAPSULE | Freq: Once | ORAL | Status: AC
  Administered 2018-07-30: 25 mg via ORAL
  Filled 2018-07-30: qty 1

## 2018-07-30 MED ORDER — POTASSIUM CHLORIDE CRYS ER 20 MEQ PO TBCR
40.0000 meq | EXTENDED_RELEASE_TABLET | Freq: Once | ORAL | Status: AC
  Administered 2018-07-30: 40 meq via ORAL
  Filled 2018-07-30: qty 2

## 2018-07-30 MED ORDER — GLUCAGON HCL RDNA (DIAGNOSTIC) 1 MG IJ SOLR
INTRAMUSCULAR | Status: DC | PRN
  Administered 2018-07-30 (×4): 0.25 mg via INTRAVENOUS

## 2018-07-30 MED ORDER — ONDANSETRON HCL 4 MG/2ML IJ SOLN
INTRAMUSCULAR | Status: DC | PRN
  Administered 2018-07-30: 4 mg via INTRAVENOUS

## 2018-07-30 MED ORDER — IOPAMIDOL (ISOVUE-300) INJECTION 61%
INTRAVENOUS | Status: AC
  Filled 2018-07-30: qty 100

## 2018-07-30 MED ORDER — LACTATED RINGERS IV SOLN
INTRAVENOUS | Status: DC
  Administered 2018-07-30: 09:00:00 via INTRAVENOUS

## 2018-07-30 MED ORDER — GLUCAGON HCL RDNA (DIAGNOSTIC) 1 MG IJ SOLR
INTRAMUSCULAR | Status: AC
  Filled 2018-07-30: qty 1

## 2018-07-30 MED ORDER — INDOMETHACIN 50 MG RE SUPP
RECTAL | Status: DC | PRN
  Administered 2018-07-30: 100 mg via RECTAL

## 2018-07-30 MED ORDER — FENTANYL CITRATE (PF) 250 MCG/5ML IJ SOLN
INTRAMUSCULAR | Status: DC | PRN
  Administered 2018-07-30: 100 ug via INTRAVENOUS

## 2018-07-30 MED ORDER — MIDAZOLAM HCL 2 MG/2ML IJ SOLN
INTRAMUSCULAR | Status: DC | PRN
  Administered 2018-07-30: 2 mg via INTRAVENOUS

## 2018-07-30 MED ORDER — PROPOFOL 10 MG/ML IV BOLUS
INTRAVENOUS | Status: DC | PRN
  Administered 2018-07-30: 200 mg via INTRAVENOUS

## 2018-07-30 MED ORDER — SODIUM CHLORIDE 0.9 % IV SOLN
INTRAVENOUS | Status: DC | PRN
  Administered 2018-07-30: 25 ug/min via INTRAVENOUS

## 2018-07-30 MED ORDER — CIPROFLOXACIN IN D5W 400 MG/200ML IV SOLN
INTRAVENOUS | Status: AC
  Filled 2018-07-30: qty 200

## 2018-07-30 MED ORDER — PROMETHAZINE HCL 25 MG/ML IJ SOLN: 6.2500 mg | INTRAMUSCULAR | Status: DC | PRN

## 2018-07-30 NOTE — Interval H&P Note (Signed)
History and Physical Interval Note:  07/30/2018 9:34 AM  [name] [name]  has presented today for surgery, with the diagnosis of Jaundice  The various methods of treatment have been discussed with the patient and family. After consideration of risks, benefits and other options for treatment, the patient has consented to  Procedure(s): ENDOSCOPIC RETROGRADE CHOLANGIOPANCREATOGRAPHY (ERCP) (N/A) as a surgical intervention .  The patient's history has been reviewed, patient examined, no change in status, stable for surgery.  I have reviewed the patient's chart and labs.  Questions were answered to the patient's satisfaction.     Pricilla Riffle. Fuller Plan

## 2018-07-30 NOTE — Transfer of Care (Signed)
Immediate Anesthesia Transfer of Care Note  Patient: [name] [name]  Procedure(s) Performed: ENDOSCOPIC RETROGRADE CHOLANGIOPANCREATOGRAPHY (ERCP) (N/A ) BILIARY BRUSHING BILIARY STENT PLACEMENT  Patient Location: Endoscopy Unit  Anesthesia Type:General  Level of Consciousness: awake, alert  and oriented  Airway & Oxygen Therapy: Patient Spontanous Breathing and Patient connected to face mask oxygen  Post-op Assessment: Report given to RN and Post -op Vital signs reviewed and stable  Post vital signs: Reviewed and stable  Last Vitals:  Vitals Value Taken Time  BP 155/90   Temp    Pulse 64 07/30/2018 11:01 AM  Resp 19 07/30/2018 11:01 AM  SpO2 100 % 07/30/2018 11:01 AM  Vitals shown include unvalidated device data.  Last Pain:  Vitals:   07/30/18 1101  TempSrc: (P) Oral  PainSc:          Complications: No apparent anesthesia complications

## 2018-07-30 NOTE — Progress Notes (Signed)
PROGRESS NOTE        PATIENT DETAILS Name: [name] [name] Age: 68 y.o. Sex: male Date of Birth: 01-08-1951 Admit Date: 07/27/2018 Admitting Physician Mariel Aloe, MD [name]:[name], Arvid Right, MD  Brief Narrative: Patient is a 68 y.o. 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  Underwent ERCP on 1/2.  See below for further details  Subjective: Denies any abdominal pain, nausea vomiting.  Assessment/Plan: Jaundice with elevated LFTs secondary to pancreatic head mass: GI consulted-underwent ERCP and CBD stent placement.  Await cytology from brushings. If stable-likely home 1/3 am.  PAF: Continue metoprolol-resume Eliquis when okay with GI.  Hypertension: BP controlled-continue amlodipine, HCTZ, losartan and metoprolol  DM-2:Continue Levemir and SSI.  Resume oral hypoglycemic agents on discharge.  GERD: Continue PPI  Dyslipidemia: Crestor on hold due to elevated LFTs  CKD stage II: Creatinine close to usual baseline.  Bronchial asthma: Stable-continue as needed bronchodilators  DVT Prophylaxis: SCD's  Code Status: Full code  Family Communication: None at bedside  Disposition Plan: Remain inpatient-Home most likely on 1/3  Antimicrobial agents: Anti-infectives (From admission, onward)   None      Procedures: 1/2>>ERCP  CONSULTS:  GI  Time spent: 25 minutes-Greater than 50% of this time was spent in counseling, explanation of diagnosis, planning of further management, and coordination of care.  MEDICATIONS: Scheduled Meds: . amLODipine  5 mg Oral Daily  . hydrochlorothiazide  25 mg Oral Daily  . indomethacin  100 mg Rectal Once  . insulin aspart  0-15 Units Subcutaneous TID WC  . insulin detemir  15 Units Subcutaneous QHS  . losartan  100 mg Oral Daily  . metoprolol succinate  50 mg Oral  Daily  . pantoprazole  40 mg Oral Daily   Continuous Infusions: . lactated ringers 10 mL/hr at 07/30/18 0855   PRN Meds:.albuterol, ibuprofen, ondansetron **OR** ondansetron (ZOFRAN) IV   PHYSICAL EXAM: Vital signs: Vitals:   07/30/18 1110 07/30/18 1120 07/30/18 1148 07/30/18 1346  BP:  (!) 145/89 (!) 150/83 (!) 158/91  Pulse: 65 72 68 67  Resp: (!) 25 (!) 23 20 18   Temp:   97.8 F (36.6 C) 97.8 F (36.6 C)  TempSrc:   Oral Oral  SpO2: 100% 99% 99% 100%  Weight:      Height:       Filed Weights   07/27/18 2034  Weight: 88.4 kg   Body mass index is 27.96 kg/m.   General appearance :Awake, alert, not in any distress.  HEENT: Atraumatic and Normocephalic Neck: supple Resp:Good air entry bilaterally, no added sounds  CVS: S1 S2 regular, no murmurs.  GI: Bowel sounds present, Non tender and not distended with no gaurding, rigidity or rebound.No organomegaly Extremities: B/L Lower Ext shows no edema, both legs are warm to touch Neurology:  speech clear,Non focal, sensation is grossly intact. Psychiatric: Normal judgment and insight. Alert and oriented x 3. Normal mood. Musculoskeletal:No digital cyanosis Skin:No Rash, warm and dry Wounds:N/A  I have personally reviewed following labs and imaging studies  LABORATORY DATA: CBC: Recent Labs  Lab 07/27/18 1141  WBC 6.5  NEUTROABS 4.5  HGB 13.3  HCT 41.8  MCV 97.2  PLT 324    Basic Metabolic Panel: Recent Labs  Lab 07/27/18 1141 07/27/18 1554 07/28/18  0459 07/29/18 0845 07/30/18 0300  NA 137 138 140 138 140  K 3.6 3.8 3.3* 3.5 3.4*  CL 97* 100 101 101 102  CO2 28 28 27 26 28   GLUCOSE 119* 132* 114* 154* 183*  BUN 14 14 13 17 16   CREATININE 1.39* 1.40* 1.43* 1.25* 1.46*  CALCIUM 9.5 9.1 9.0 9.5 9.2    GFR: Estimated Creatinine Clearance: 53.5 mL/min (A) (by C-G formula based on SCr of 1.46 mg/dL (H)).  Liver Function Tests: Recent Labs  Lab 07/27/18 1141 07/27/18 1554 07/27/18 2100  07/28/18 0459 07/29/18 0845 07/30/18 0300  AST 380* 351*  --  379* 351* 325*  ALT 401* 378*  --  417* 453* 462*  ALKPHOS 199* 178*  --  172* 186* 205*  BILITOT 9.8* 8.9* 8.8* 9.1* 10.5* 12.0*  PROT 8.3* 7.6  --  7.1 7.9 8.0  ALBUMIN 3.8 3.6  --  3.4* 3.6 3.5   Recent Labs  Lab 07/27/18 1141  LIPASE 34   No results for input(s): AMMONIA in the last 168 hours.  Coagulation Profile: Recent Labs  Lab 07/27/18 1301 07/27/18 1554 07/28/18 0459 07/29/18 0647  INR 1.22 1.14 1.16 1.02    Cardiac Enzymes: No results for input(s): CKTOTAL, CKMB, CKMBINDEX, TROPONINI in the last 168 hours.  BNP (last 3 results) No results for input(s): PROBNP in the last 8760 hours.  HbA1C: No results for input(s): HGBA1C in the last 72 hours.  CBG: Recent Labs  Lab 07/29/18 1227 07/29/18 1741 07/29/18 2151 07/30/18 0808 07/30/18 1231  GLUCAP 130* 101* 135* 95 173*    Lipid Profile: No results for input(s): CHOL, HDL, LDLCALC, TRIG, CHOLHDL, LDLDIRECT in the last 72 hours.  Thyroid Function Tests: No results for input(s): TSH, T4TOTAL, FREET4, T3FREE, THYROIDAB in the last 72 hours.  Anemia Panel: No results for input(s): VITAMINB12, FOLATE, FERRITIN, TIBC, IRON, RETICCTPCT in the last 72 hours.  Urine analysis:    Component Value Date/Time   COLORURINE AMBER (A) 07/27/2018 1126   APPEARANCEUR CLEAR 07/27/2018 1126   LABSPEC 1.018 07/27/2018 1126   PHURINE 5.0 07/27/2018 1126   GLUCOSEU NEGATIVE 07/27/2018 1126   GLUCOSEU NEGATIVE 04/03/2017 0841   HGBUR NEGATIVE 07/27/2018 1126   BILIRUBINUR MODERATE (A) 07/27/2018 1126   [place] 07/27/2018 1126   PROTEINUR NEGATIVE 07/27/2018 1126   UROBILINOGEN 0.2 04/03/2017 0841   NITRITE NEGATIVE 07/27/2018 1126   LEUKOCYTESUR NEGATIVE 07/27/2018 1126    Sepsis Labs: Lactic Acid, Venous    Component Value Date/Time   LATICACIDVEN 1.8 10/17/2012 1006    MICROBIOLOGY: No results found for this or any previous visit  (from the past 240 hour(s)).  RADIOLOGY STUDIES/RESULTS: Mr Abdomen Mrcp Wo Contrast  Result Date: 07/28/2018 CLINICAL DATA:  68 year old male with history of right-sided flank pain and abnormal liver function tests. EXAM: MRI ABDOMEN WITHOUT CONTRAST  (INCLUDING MRCP) TECHNIQUE: Multiplanar multisequence MR imaging of the abdomen was performed. Heavily T2-weighted images of the biliary and pancreatic ducts were obtained, and three-dimensional MRCP images were rendered by post processing. COMPARISON:  No prior abdominal MRI. CT the abdomen and pelvis 07/27/2018. Abdominal ultrasound 07/27/2018. FINDINGS: Comment: Per report from the technologist, the patient refused to continue part way through the examination, limiting the acquisition of images in the diagnostic sensitivity and specificity of the examination. Additionally, today's study is limited by lack of IV gadolinium for detection and characterization of visceral and/or vascular lesions. Lower chest: Unremarkable. Hepatobiliary: No definite cystic or solid hepatic lesions are confidently  identified on today's noncontrast examination. Gallbladder is moderately distended, but otherwise unremarkable in appearance. No filling defects in the gallbladder to suggest gallstones. There is severe intra and extrahepatic biliary ductal dilatation. Common bile duct measures up to 15 mm in the porta hepatis and is abruptly cut off proximally. The obstruction of the gallbladder appears extrinsic secondary to a pancreatic head mass (discussed below). Pancreas: In the head of the pancreas (axial image 22 of series 6 and coronal image 19 of series 4) there is a 4.8 x 3.3 x 5.4 cm lesion that is heterogeneous in signal intensity on T2 weighted images with multiple internal areas of T2 hyperintensity, favored to reflect the presence of innumerable small cystic areas, concerning for pancreatic neoplasm. This appears to occlude the common bile duct. Pancreatic duct does not  appear occluded, and is normal in caliber. Body and tail of the pancreas are unremarkable in appearance. Spleen:  Unremarkable. Adrenals/Urinary Tract: Multiple T2 hyperintense lesions in both kidneys, incompletely characterized on today's examination, but favored to represent cysts, measuring up to 1.9 cm in the medial aspect of the upper pole the right kidney. No hydroureteronephrosis in the visualized portions of the abdomen. Bilateral adrenal glands are normal in appearance. Stomach/Bowel: Visualized portions are unremarkable. Vascular/Lymphatic: No aneurysm identified in the visualized abdominal vasculature. No definite lymphadenopathy noted in the visualized abdomen. Other: No significant volume of ascites noted in the visualized portions of the abdomen. Small supraumbilical ventral hernia containing only omental fat. Musculoskeletal: No aggressive appearing osseous lesions are noted in the visualized portions of the skeleton. IMPRESSION: 1. 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 At this time, this is associated with severe intra and extrahepatic biliary ductal dilatation. This is poorly evaluated on today's incomplete noncontrast abdominal MRI. Further evaluation with endoscopic ultrasound is strongly recommended for definitive diagnosis. Electronically Signed   By: Vinnie Langton M.D.   On: 07/28/2018 15:10   Mr 3d Recon At Scanner  Result Date: 07/28/2018 CLINICAL DATA:  68 year old male with history of right-sided flank pain and abnormal liver function tests. EXAM: MRI ABDOMEN WITHOUT CONTRAST  (INCLUDING MRCP) TECHNIQUE: Multiplanar multisequence MR imaging of the abdomen was performed. Heavily T2-weighted images of the biliary and pancreatic ducts were obtained, and three-dimensional MRCP images were rendered by post processing. COMPARISON:  No prior abdominal MRI. CT the abdomen and pelvis 07/27/2018. Abdominal ultrasound  07/27/2018. FINDINGS: Comment: Per report from the technologist, the patient refused to continue part way through the examination, limiting the acquisition of images in the diagnostic sensitivity and specificity of the examination. Additionally, today's study is limited by lack of IV gadolinium for detection and characterization of visceral and/or vascular lesions. Lower chest: Unremarkable. Hepatobiliary: No definite cystic or solid hepatic lesions are confidently identified on today's noncontrast examination. Gallbladder is moderately distended, but otherwise unremarkable in appearance. No filling defects in the gallbladder to suggest gallstones. There is severe intra and extrahepatic biliary ductal dilatation. Common bile duct measures up to 15 mm in the porta hepatis and is abruptly cut off proximally. The obstruction of the gallbladder appears extrinsic secondary to a pancreatic head mass (discussed below). Pancreas: In the head of the pancreas (axial image 22 of series 6 and coronal image 19 of series 4) there is a 4.8 x 3.3 x 5.4 cm lesion that is heterogeneous in signal intensity on T2 weighted images with multiple internal areas of T2 hyperintensity, favored to reflect  the presence of innumerable small cystic areas, concerning for pancreatic neoplasm. This appears to occlude the common bile duct. Pancreatic duct does not appear occluded, and is normal in caliber. Body and tail of the pancreas are unremarkable in appearance. Spleen:  Unremarkable. Adrenals/Urinary Tract: Multiple T2 hyperintense lesions in both kidneys, incompletely characterized on today's examination, but favored to represent cysts, measuring up to 1.9 cm in the medial aspect of the upper pole the right kidney. No hydroureteronephrosis in the visualized portions of the abdomen. Bilateral adrenal glands are normal in appearance. Stomach/Bowel: Visualized portions are unremarkable. Vascular/Lymphatic: No aneurysm identified in the visualized  abdominal vasculature. No definite lymphadenopathy noted in the visualized abdomen. Other: No significant volume of ascites noted in the visualized portions of the abdomen. Small supraumbilical ventral hernia containing only omental fat. Musculoskeletal: No aggressive appearing osseous lesions are noted in the visualized portions of the skeleton. IMPRESSION: 1. 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 At this time, this is associated with severe intra and extrahepatic biliary ductal dilatation. This is poorly evaluated on today's incomplete noncontrast abdominal MRI. Further evaluation with endoscopic ultrasound is strongly recommended for definitive diagnosis. Electronically Signed   By: Vinnie Langton M.D.   On: 07/28/2018 15:10   Dg Ercp  Result Date: 07/30/2018 CLINICAL DATA:  ERCP with biliary brush biopsy and stent placement EXAM: ERCP TECHNIQUE: Multiple spot images obtained with the fluoroscopic device and submitted for interpretation post-procedure. COMPARISON:  MRCP-07/28/2018 FLUOROSCOPY TIME:  3 minutes, 5 seconds FINDINGS: Thirteen spot fluoroscopic images of the right upper abdominal quadrant during ERCP are provided for review Initial image demonstrates an ERCP probe overlying the right upper abdominal quadrant. Subsequent image demonstrates selective cannulation of the pancreatic and common bile ducts. Contrast injection demonstrates moderate to long segment narrowing of the distal aspect of the CBD with upstream dilatation of the cystic and proper hepatic ducts. There is minimal opacification of the left side of the intrahepatic biliary tree which appears mildly dilated and somewhat irregular. Completion image demonstrates placement of an internal biliary stent overlying the distal CBD traversing the segment of narrowing of the CBD. IMPRESSION: ERCP with biliary stent placement as above. These images were submitted for  radiologic interpretation only. Please see the procedural report for the amount of contrast and the fluoroscopy time utilized. Electronically Signed   By: Sandi Mariscal M.D.   On: 07/30/2018 11:27   Ct Renal Stone Study  Result Date: 07/27/2018 CLINICAL DATA:  Right flank pain and discolored urine for 4-5 days. EXAM: CT ABDOMEN AND PELVIS WITHOUT CONTRAST TECHNIQUE: Multidetector CT imaging of the abdomen and pelvis was performed following the standard protocol without IV contrast. COMPARISON:  None. FINDINGS: Lower chest: There is cardiomegaly. No pleural or pericardial effusion. Lung bases are clear. Hepatobiliary: No focal liver abnormality is seen. No gallstones, gallbladder wall thickening, or biliary dilatation. Pancreas: Unremarkable. No pancreatic ductal dilatation or surrounding inflammatory changes. Spleen: Normal in size without focal abnormality. Adrenals/Urinary Tract: Adrenal glands are unremarkable. Kidneys are normal, without renal calculi, focal solid lesion, or hydronephrosis. Small right renal cyst is noted. Bladder is unremarkable. Stomach/Bowel: Surgical anastomosis at the junction of the descending and sigmoid colon noted. Scattered diverticulosis is seen throughout the colon but there is no evidence of diverticulitis. The appendix is not visualized and may have been removed. Small hiatal hernia is noted. The stomach is otherwise unremarkable. Small bowel loops appear normal. Vascular/Lymphatic:  Aortic atherosclerosis. No enlarged abdominal or pelvic lymph nodes. Reproductive: Prostate gland is enlarged. Other: Small fat containing supraumbilical hernia is identified. Musculoskeletal: No fracture or worrisome lesion. Facet degenerative disease results in 0.7 cm anterolisthesis L5 on S1. IMPRESSION: Negative for urinary tract stone. No acute abnormality abdomen or pelvis. Cardiomegaly. Atherosclerosis. Diverticulosis without diverticulitis. Fat containing supraumbilical hernia. Lower lumbar  spondylosis. Electronically Signed   By: Inge Rise M.D.   On: 07/27/2018 12:39   Vas US Aorta/ivc/iliacs  Result Date: 07/27/2018 ABDOMINAL AORTA STUDY Indications: Patient c/o right flank discomfort. Risk Factors: Hypertension, hyperlipidemia, Diabetes, past history of smoking. Limitations: Air/bowel gas.  Comparison Study: 06/28/14 at [facility], visceral duplex showed no AAA, but did show                   increased left common iliac artery size at 1.5 cm x 1.6 cm,                   and elevated velocity of 229 cm/sec. Performing Technologist: [employer] BS, RVT, RDCS  Examination Guidelines: A complete evaluation includes B-mode imaging, spectral Doppler, color Doppler, and power Doppler as needed of all accessible portions of each vessel. Bilateral testing is considered an integral part of a complete examination. Limited examinations for reoccurring indications may be performed as noted.  Abdominal Aorta Findings: +-------------+-------+----------+----------+--------+--------+--------+ Location     AP (cm)Trans (cm)PSV (cm/s)WaveformThrombusComments +-------------+-------+----------+----------+--------+--------+--------+ Proximal     2.10   2.20      110                                +-------------+-------+----------+----------+--------+--------+--------+ Mid          1.90   1.90      67                                 +-------------+-------+----------+----------+--------+--------+--------+ Distal       1.80   2.00      43                                 +-------------+-------+----------+----------+--------+--------+--------+ RT CIA Prox  1.6    1.7       101                                +-------------+-------+----------+----------+--------+--------+--------+ RT CIA Mid                    76                                 +-------------+-------+----------+----------+--------+--------+--------+ RT CIA Distal                 98                                  +-------------+-------+----------+----------+--------+--------+--------+ RT EIA Prox  1.0    1.1       104                                +-------------+-------+----------+----------+--------+--------+--------+  RT EIA Mid                    77                                 +-------------+-------+----------+----------+--------+--------+--------+ RT EIA Distal                 72                                 +-------------+-------+----------+----------+--------+--------+--------+ LT CIA Prox  1.6    1.6       153                                +-------------+-------+----------+----------+--------+--------+--------+ LT CIA Mid                    219                                +-------------+-------+----------+----------+--------+--------+--------+ LT EIA Prox  1.1    1.1       74                                 +-------------+-------+----------+----------+--------+--------+--------+ LT EIA Mid                    87                                 +-------------+-------+----------+----------+--------+--------+--------+ LT EIA Distal                 73                                 +-------------+-------+----------+----------+--------+--------+--------+  Visualization of the Left CIA Distal artery was limited. Limited imaging of the right flank reveals an enlarged gall bladder, measuring 12 cm in length and 4.5 cm in width (normal values are respectively 7-10 cm long, and 2.8-3.9 wide). IVC/Iliac Findings: +--------+------+--------+--------+   IVC   PatentThrombusComments +--------+------+--------+--------+ IVC Proxpatent                 +--------+------+--------+--------+  Summary: Abdominal Aorta: No evidence of an abdominal aortic aneurysm was visualized. 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 The largest aortic measurement is 2.2 cm. 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  *See table(s) above for measurements and observations. Suggest follow up study in 12 months.  Electronically signed by Ida Rogue MD on 07/27/2018 at 10:27:46 PM.    Final    US Abdomen Limited Ruq  Result Date: 07/27/2018 CLINICAL DATA:  Elevated LFT EXAM: ULTRASOUND ABDOMEN LIMITED RIGHT UPPER QUADRANT COMPARISON:  CT 07/27/2018 FINDINGS: Gallbladder: Moderate sludge in the gallbladder. Normal wall thickness. Negative sonographic Murphy. Common bile duct: Diameter: 10 mm Liver: Increased echogenicity. Mild intra hepatic biliary dilatation. No focal hepatic  abnormality. Portal vein is patent on color Doppler imaging with normal direction of blood flow towards the liver. Isoechoic area surrounding the right kidney, no correlate on CT, may represent prominent retroperitoneal fat deposition. IMPRESSION: 1. Moderate sludge in the gallbladder without sonographic evidence for an acute cholecystitis 2. Mild intra hepatic biliary dilatation with enlarged extrahepatic common bile duct up to 1 cm. Further evaluation with MRCP as clinically indicated 3. Echogenic liver suggesting steatosis and or hepatocellular disease. Electronically Signed   By: Donavan Foil M.D.   On: 07/27/2018 20:03     LOS: 0 days   Oren Binet, MD  [facility]  If 7PM-7AM, please contact night-coverage  Please page via [email or web address]-click on MD name and type text message  07/30/2018, 2:51 PM

## 2018-07-30 NOTE — Anesthesia Preprocedure Evaluation (Addendum)
Anesthesia Evaluation  Patient identified by MRN, date of birth, ID band Patient awake    Reviewed: Allergy & Precautions, NPO status , Patient's Chart, lab work & pertinent test results  History of Anesthesia Complications Negative for: history of anesthetic complications  Airway Mallampati: II  TM Distance: >3 FB Neck ROM: Full    Dental  (+) Dental Advisory Given, Missing,    Pulmonary asthma , COPD,  COPD inhaler, former smoker,    Pulmonary exam normal breath sounds clear to auscultation       Cardiovascular hypertension, Pt. on medications Normal cardiovascular exam+ dysrhythmias Atrial Fibrillation  Rhythm:Regular Rate:Normal  TTE 07/02/18: severe LVH, severe asymmetric   hypertrophy of the septum, EF 16-10%, grade 2 diastolic dysfunction, mild MR, mod/severe LAE, mild TR, PASP 33    Neuro/Psych TIA   GI/Hepatic Neg liver ROS, GERD  Medicated and Controlled,  Endo/Other  diabetes, Type 2, Oral Hypoglycemic Agents  Renal/GU Renal InsufficiencyRenal disease     Musculoskeletal negative musculoskeletal ROS (+)   Abdominal   Peds  Hematology negative hematology ROS (+)   Anesthesia Other Findings Day of surgery medications reviewed with the patient.  Reproductive/Obstetrics                            Anesthesia Physical Anesthesia Plan  ASA: III  Anesthesia Plan: General   Post-op Pain Management:    Induction: Intravenous  PONV Risk Score and Plan: 2 and Treatment may vary due to age or medical condition, Ondansetron and Dexamethasone  Airway Management Planned: Oral ETT  Additional Equipment:   Intra-op Plan:   Post-operative Plan: Extubation in OR  Informed Consent: I have reviewed the patients History and Physical, chart, labs and discussed the procedure including the risks, benefits and alternatives for the proposed anesthesia with the patient or authorized  representative who has indicated his/her understanding and acceptance.   Dental advisory given  Plan Discussed with: CRNA  Anesthesia Plan Comments:        Anesthesia Quick Evaluation

## 2018-07-30 NOTE — Anesthesia Procedure Notes (Signed)
Procedure Name: Intubation Date/Time: 07/30/2018 9:51 AM Performed by: Alain Marion, CRNA Pre-anesthesia Checklist: Patient identified, Emergency Drugs available, Suction available and Patient being monitored Patient Re-evaluated:Patient Re-evaluated prior to induction Oxygen Delivery Method: Circle System Utilized Preoxygenation: Pre-oxygenation with 100% oxygen Induction Type: IV induction Ventilation: Mask ventilation without difficulty Laryngoscope Size: Miller and 2 Grade View: Grade I Tube type: Oral Tube size: 7.5 mm Number of attempts: 1 Airway Equipment and Method: Stylet and Oral airway Placement Confirmation: ETT inserted through vocal cords under direct vision,  positive ETCO2 and breath sounds checked- equal and bilateral Secured at: 22 cm Tube secured with: Tape Dental Injury: Teeth and Oropharynx as per pre-operative assessment

## 2018-07-30 NOTE — Anesthesia Postprocedure Evaluation (Signed)
Anesthesia Post Note  Patient: [name] [name]  Procedure(s) Performed: ENDOSCOPIC RETROGRADE CHOLANGIOPANCREATOGRAPHY (ERCP) (N/A ) BILIARY BRUSHING BILIARY STENT PLACEMENT     Patient location during evaluation: Endoscopy Anesthesia Type: General Level of consciousness: awake and alert Pain management: pain level controlled Vital Signs Assessment: post-procedure vital signs reviewed and stable Respiratory status: spontaneous breathing, nonlabored ventilation and respiratory function stable Cardiovascular status: blood pressure returned to baseline and stable Postop Assessment: no apparent nausea or vomiting Anesthetic complications: no    Last Vitals:  Vitals:   07/30/18 1107 07/30/18 1120  BP: (!) 144/84 (!) 145/89  Pulse: 65 72  Resp: 20 (!) 23  Temp:    SpO2: 100% 99%    Last Pain:  Vitals:   07/30/18 1101  TempSrc: Oral  PainSc: 0-No pain                 Brennan Bailey

## 2018-07-30 NOTE — Op Note (Signed)
[facility] Patient Name: [name] [name] Procedure Date : 07/30/2018 MRN: [id number] Attending MD: Ladene Artist , MD Date of Birth: Jan 27, 1951 CSN: [id number] Age: 68 Admit Type: Inpatient Procedure:                ERCP Indications:              Biliary dilation on imaging, Jaundice, Elevated                            liver enzymes, Pancreatic mass on imaging Providers:                Pricilla Riffle. Fuller Plan, MD, Carlyn Reichert, RN, Raynelle Bring, RN, Janeece Agee, Technician, Cletis Athens,                            Technician, Carver Fila Referring MD:             [facility] Medicines:                General Anesthesia Complications:            No immediate complications. Estimated Blood Loss:     Estimated blood loss: none. Procedure:                Pre-Anesthesia Assessment:                           - Prior to the procedure, a History and Physical                            was performed, and patient medications and                            allergies were reviewed. The patient's tolerance of                            previous anesthesia was also reviewed. The risks                            and benefits of the procedure and the sedation                            options and risks were discussed with the patient.                            All questions were answered, and informed consent                            was obtained. Prior Anticoagulants: The patient has                            taken Eliquis (apixaban), last dose was 2 days  prior to procedure. ASA Grade Assessment: III - A                            patient with severe systemic disease. After                            reviewing the risks and benefits, the patient was                            deemed in satisfactory condition to undergo the                            procedure.                           After obtaining informed consent, the  scope was                            passed under direct vision. Throughout the                            procedure, the patient's blood pressure, pulse, and                            oxygen saturations were monitored continuously. The                            [id number]-[id number] ([id number] Olympus Duodensocope was                            introduced through the mouth, and used to inject                            contrast into and used to inject contrast into the                            bile duct and ventral pancreatic duct. The ERCP was                            accomplished without difficulty. The patient                            tolerated the procedure well. Scope In: Scope Out: Findings:      The scout film was normal. The esophagus was successfully intubated       under direct vision. The scope was advanced to a normal major papilla in       the descending duodenum without detailed examination of the pharynx,       larynx and associated structures, and upper GI tract. The upper GI tract       was grossly normal except for retained gastric solids. A straight       Roadrunner wire was passed into the biliary tree following 3 passes into       the PD and a wire temporarily placed in the PD. The short-nosed traction  sphincterotome was passed over the guidewire and the bile duct was then       deeply cannulated. Contrast was injected. The PD wire was removed. I       personally interpreted the bile duct images. There was appropriate flow       of contrast through the ducts. The upper portion of the main bile duct       was diffusely dilated, secondary to a stricture. The largest diameter       was 15 mm. The left and right hepatic ducts, all intrahepatic branches       and cystic duct were diffusely dilated, secondary to a stricture. The       largest diameter was 9 mm. The intrahepatic ducts and gallbladder were       not completely filled. The lower third of the main bile duct  contained a       single segmental malignant appearing stenosis 8 mm in length. The       stenosis was brushed for cytology. One 8.5 Fr by 7 cm plastic stent with       a single external flap and a single internal flap was placed 6 cm into       the common bile duct. Bile flowed through the stent. The stent was in       good position across the stenosis. Adequate biliary drainage was then       noted. 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 Brushings for cytology were obtained.                           - The upper portion of the main bile duct was                            dilated, secondary to a stricture.                           - The left and right hepatic ducts, all                            intrahepatic branches and cystic duct were dilated,                            secondary to a stricture.                           - One plastic stent was placed into the common bile                            duct.                           -  Retained gastric solids. Recommendation:           - Observe patient's clinical course following                            today's ERCP with therapeutic intervention.                           - Return patient to hospital ward for possible                            discharge same day.                           - Await cytology results.                           - Trend LFTs.                           - Patient has a contact number available for                            emergencies. The signs and symptoms of potential                            delayed complications were discussed with the                            patient. Return to normal activities tomorrow.                            Written discharge instructions were provided to the                            patient.                            - Continue to hold anticoagulation in anticipation                            of EUS/FNA to be scheduled as outpatient Procedure Code(s):        --- Professional ---                           [phone], Endoscopic retrograde                            cholangiopancreatography (ERCP); with placement of                            endoscopic stent into biliary or pancreatic duct,                            including pre- and post-dilation and guide wire  passage, when performed, including sphincterotomy,                            when performed, each stent Diagnosis Code(s):        --- Professional ---                           K83.1, Obstruction of bile duct                           R17, Unspecified jaundice                           R74.8, Abnormal levels of other serum enzymes                           K83.8, Other specified diseases of biliary tract CPT copyright 2018 [employer]. All rights reserved. The codes documented in this report are preliminary and upon coder review may  be revised to meet current compliance requirements. Ladene Artist, MD 07/30/2018 11:00:20 AM This report has been signed electronically. Number of Addenda: 0

## 2018-07-31 ENCOUNTER — Ambulatory Visit: Payer: Medicare Other | Admitting: Internal Medicine

## 2018-07-31 DIAGNOSIS — K869 Disease of pancreas, unspecified: Secondary | ICD-10-CM | POA: Diagnosis not present

## 2018-07-31 DIAGNOSIS — N182 Chronic kidney disease, stage 2 (mild): Secondary | ICD-10-CM | POA: Diagnosis not present

## 2018-07-31 DIAGNOSIS — E1122 Type 2 diabetes mellitus with diabetic chronic kidney disease: Secondary | ICD-10-CM | POA: Diagnosis not present

## 2018-07-31 DIAGNOSIS — I129 Hypertensive chronic kidney disease with stage 1 through stage 4 chronic kidney disease, or unspecified chronic kidney disease: Secondary | ICD-10-CM | POA: Diagnosis not present

## 2018-07-31 DIAGNOSIS — R198 Other specified symptoms and signs involving the digestive system and abdomen: Secondary | ICD-10-CM | POA: Diagnosis not present

## 2018-07-31 DIAGNOSIS — I10 Essential (primary) hypertension: Secondary | ICD-10-CM | POA: Diagnosis not present

## 2018-07-31 DIAGNOSIS — R945 Abnormal results of liver function studies: Secondary | ICD-10-CM | POA: Diagnosis not present

## 2018-07-31 DIAGNOSIS — I480 Paroxysmal atrial fibrillation: Secondary | ICD-10-CM | POA: Diagnosis not present

## 2018-07-31 DIAGNOSIS — R932 Abnormal findings on diagnostic imaging of liver and biliary tract: Secondary | ICD-10-CM | POA: Diagnosis not present

## 2018-07-31 LAB — COMPREHENSIVE METABOLIC PANEL
ALK PHOS: 203 U/L — AB (ref 38–126)
ALT: 373 U/L — ABNORMAL HIGH (ref 0–44)
AST: 213 U/L — ABNORMAL HIGH (ref 15–41)
Albumin: 3.4 g/dL — ABNORMAL LOW (ref 3.5–5.0)
Anion gap: 9 (ref 5–15)
BUN: 19 mg/dL (ref 8–23)
CALCIUM: 8.9 mg/dL (ref 8.9–10.3)
CO2: 28 mmol/L (ref 22–32)
Chloride: 102 mmol/L (ref 98–111)
Creatinine, Ser: 1.72 mg/dL — ABNORMAL HIGH (ref 0.61–1.24)
GFR calc Af Amer: 46 mL/min — ABNORMAL LOW (ref >60)
GFR calc non Af Amer: 40 mL/min — ABNORMAL LOW (ref >60)
Glucose, Bld: 114 mg/dL — ABNORMAL HIGH (ref 70–99)
Potassium: 3.8 mmol/L (ref 3.5–5.1)
Sodium: 139 mmol/L (ref 135–145)
Total Bilirubin: 5.1 mg/dL — ABNORMAL HIGH (ref 0.3–1.2)
Total Protein: 7.6 g/dL (ref 6.5–8.1)

## 2018-07-31 LAB — GLUCOSE, CAPILLARY: GLUCOSE-CAPILLARY: 131 mg/dL — AB (ref 70–99)

## 2018-07-31 NOTE — Discharge Summary (Signed)
PATIENT DETAILS Name: [name] [name] Age: 68 y.o. Sex: male Date of Birth: 12/17/1950 MRN: [id number]. Admitting Physician: Mariel Aloe, MD [id number], Arvid Right, MD  Admit Date: 07/27/2018 Discharge date: 07/31/2018  Recommendations for Outpatient Follow-up:  1. Follow up with PCP in 1-2 weeks 2. Please obtain CMP/CBC in one week 3. Has newly diagnosed pancreatic head mass-underwent ERCP with brushings-needs further work-up-based on cytology  4. Please ensure follow-up with gastroenterology.  Admitted From:  Home  Disposition: [place]: No  Equipment/Devices: None  Discharge Condition: Stable  CODE STATUS: FULL CODE  Diet recommendation:  Heart Healthy / Carb Modified  Brief Summary: See H&P, Labs, Consult and Test reports for all details in brief, Patient is a 68 y.o. 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  Underwent ERCP on 1/2.  See below for further details  Brief Hospital Course: Jaundice with elevated LFTs secondary to pancreatic head mass: GI consulted-underwent ERCP and CBD stent placement.  Cytology from brushings are pending-this will need to be followed by primary MD or his gastroenterology.  Spoke with GI team Tye Savoy PA-C) today-they will arrange for outpatient follow-up.  GI planning on EUS if cytology is negative.  Please repeat LFTs at next visit with PCP-LFTs are starting to downtrend after CBD stent placement.  PAF: Continue metoprolol-resume Eliquis on discharge  Hypertension: BP controlled-continue amlodipine, HCTZ, losartan and metoprolol  DM-2:Continue Levemir and SSI.  Resume oral hypoglycemic agents on discharge.  GERD: Continue PPI  Dyslipidemia: Crestor on hold due to elevated LFTs  CKD stage II: Creatinine close to usual baseline.  Bronchial asthma:  Stable-continue as needed bronchodilators  Procedures/Studies: 1/2>> ERCP with CBD stent placement  Discharge Diagnoses:  Active Problems:   Essential hypertension, benign   GERD (gastroesophageal reflux disease)   Hyperlipidemia with target LDL less than 100   Atrial fibrillation (HCC)   Elevated LFTs   Elevated bilirubin   Abnormal finding of biliary tract   Jaundice   Dilated bile duct   Discharge Instructions:  Activity:  As tolerated   Discharge Instructions    Call MD for:  persistant nausea and vomiting   Complete by:  As directed    Call MD for:  severe uncontrolled pain   Complete by:  As directed    Diet - low sodium heart healthy   Complete by:  As directed    Diet Carb Modified   Complete by:  As directed    Discharge instructions   Complete by:  As directed    Follow with Primary MD  Janith Lima, MD in 1 week  [place] [facility] will call you for a follow-up appointment  Your brushings/cytology done with ERCP are currently pending-this will need to be followed by your primary MD or gastroenterology.  Please get a complete blood count and chemistry panel checked by your Primary MD at your next visit, and again as instructed by your Primary MD.  Get Medicines reviewed and adjusted: Please take all your medications with you for your next visit with your Primary MD  Laboratory/radiological data: Please request your Primary MD to go over all hospital tests and procedure/radiological results at the follow up, please ask your Primary MD to get all Hospital records sent to his/her office.  In some cases, they will be blood work, cultures and biopsy results pending at the time of your discharge. Please request  that your primary care M.D. follows up on these results.  Also Note the following: If you experience worsening of your admission symptoms, develop shortness of breath, life threatening emergency, suicidal or homicidal thoughts you must seek  medical attention immediately by calling 911 or calling your MD immediately  if symptoms less severe.  You must read complete instructions/literature along with all the possible adverse reactions/side effects for all the Medicines you take and that have been prescribed to you. Take any new Medicines after you have completely understood and accpet all the possible adverse reactions/side effects.   Do not drive when taking Pain medications or sleeping medications (Benzodaizepines)  Do not take more than prescribed Pain, Sleep and Anxiety Medications. It is not advisable to combine anxiety,sleep and pain medications without talking with your primary care practitioner  Special Instructions: If you have smoked or chewed Tobacco  in the last 2 yrs please stop smoking, stop any regular Alcohol  and or any Recreational drug use.  Wear Seat belts while driving.  Please note: You were cared for by a hospitalist during your hospital stay. Once you are discharged, your primary care physician will handle any further medical issues. Please note that NO REFILLS for any discharge medications will be authorized once you are discharged, as it is imperative that you return to your primary care physician (or establish a relationship with a primary care physician if you do not have one) for your post hospital discharge needs so that they can reassess your need for medications and monitor your lab values.   Increase activity slowly   Complete by:  As directed      Allergies as of 07/31/2018      Reactions   Hydralazine Shortness Of Breath   Heart palpitations   Lisinopril Cough   Penicillins Other (See Comments)   Whelps DID THE REACTION INVOLVE: Swelling of the face/tongue/throat, SOB, or low BP? No Yes Sudden or severe rash/hives, skin peeling, or the inside of the mouth or nose? No Did it require medical treatment? No When did it last happen?teens  If all above answers are "NO", may proceed with  cephalosporin use.   Tribenzor [olmesartan-amlodipine-hctz] Nausea Only   dizziness      Medication List    TAKE these medications   albuterol 108 (90 Base) MCG/ACT inhaler Commonly known as:  PROVENTIL HFA;VENTOLIN HFA Inhale 2 puffs into the lungs every 6 (six) hours as needed for wheezing.   amLODipine 5 MG tablet Commonly known as:  NORVASC TAKE 1 TABLET BY MOUTH ONCE DAILY   apixaban 5 MG Tabs tablet Commonly known as:  ELIQUIS Take 1 tablet (5 mg total) by mouth 2 (two) times daily.   diazepam 5 MG tablet Commonly known as:  VALIUM Take 1 tablet (5 mg total) by mouth every 6 (six) hours as needed for anxiety.   fish oil-omega-3 fatty acids 1000 MG capsule Take 2 g by mouth at bedtime.   glucose blood test strip Commonly known as:  ONETOUCH VERIO USE AS INSTRUCTED THREE TIMES DAILY. DX CODE E11.21   Glycopyrrolate-Formoterol 9-4.8 MCG/ACT Aero Commonly known as:  BEVESPI AEROSPHERE Inhale 2 Act 2 (two) times daily into the lungs.   hydrochlorothiazide 25 MG tablet Commonly known as:  HYDRODIURIL TAKE 1 TABLET BY MOUTH ONCE DAILY   Insulin Detemir 100 UNIT/ML Pen Commonly known as:  LEVEMIR FLEXPEN Inject 15 units once daily. What changed:    how much to take  how to take this  when to take this  additional instructions   insulin regular 100 units/mL injection Commonly known as:  NOVOLIN R RELION INJECT 5-8 UNITS SUBCUTANEOUSLY THREE TIMES DAILY before meals What changed:    how much to take  how to take this  when to take this  additional instructions  Another medication with the same name was removed. Continue taking this medication, and follow the directions you see here.   Insulin Syringe-Needle U-100 31G X 5/16" 0.3 ML Misc Commonly known as:  EASY TOUCH INSULIN SYRINGE USE AS DIRECTED 4 TIMES DAILY   levocetirizine 5 MG tablet Commonly known as:  XYZAL Take 1 tablet (5 mg total) by mouth every evening.   linagliptin 5 MG Tabs  tablet Commonly known as:  TRADJENTA Take 1 tablet (5 mg total) by mouth daily. What changed:  when to take this   losartan 100 MG tablet Commonly known as:  COZAAR TAKE 1 TABLET BY MOUTH ONCE DAILY   metFORMIN 500 MG tablet Commonly known as:  GLUCOPHAGE Take 1 tablet (500 mg total) by mouth 2 (two) times daily with a meal.   metoprolol succinate 50 MG 24 hr tablet Commonly known as:  TOPROL-XL TAKE 1 TABLET BY MOUTH ONCE DAILY WITH  OR  IMMEDIATELY  FOLLOWING  A  MEAL What changed:  See the new instructions.   montelukast 10 MG tablet Commonly known as:  SINGULAIR Take 1 tablet (10 mg total) by mouth at bedtime.   multivitamin with minerals Tabs tablet Take 1 tablet by mouth daily.   omeprazole 20 MG capsule Commonly known as:  PRILOSEC TAKE 1 CAPSULE BY MOUTH ONCE DAILY      Follow-up Information    Gatha Mayer, MD Follow up on 07/31/2018.   Specialty:  Gastroenterology Why:  3:45 pm Contact information: [address] [place] [address] [phone]        Janith Lima, MD. Schedule an appointment as soon as possible for a visit in 1 week(s).   Specialty:  Internal Medicine Contact information: [address] [place] [address] [phone]          Allergies  Allergen Reactions  . Hydralazine Shortness Of Breath    Heart palpitations  . Lisinopril Cough  . Penicillins Other (See Comments)    [place] DID THE REACTION INVOLVE: Swelling of the face/tongue/throat, SOB, or low BP? No Yes Sudden or severe rash/hives, skin peeling, or the inside of the mouth or nose? No Did it require medical treatment? No When did it last happen?teens  If all above answers are "NO", may proceed with cephalosporin use.  Marland Kitchen Tribenzor [Olmesartan-Amlodipine-Hctz] Nausea Only    dizziness      Consultations:   GI   Other Procedures/Studies: Mr Abdomen Mrcp Wo Contrast  Result Date: 07/28/2018 CLINICAL DATA:  68 year old male  with history of right-sided flank pain and abnormal liver function tests. EXAM: MRI ABDOMEN WITHOUT CONTRAST  (INCLUDING MRCP) TECHNIQUE: Multiplanar multisequence MR imaging of the abdomen was performed. Heavily T2-weighted images of the biliary and pancreatic ducts were obtained, and three-dimensional MRCP images were rendered by post processing. COMPARISON:  No prior abdominal MRI. CT the abdomen and pelvis 07/27/2018. Abdominal ultrasound 07/27/2018. FINDINGS: Comment: Per report from the technologist, the patient refused to continue part way through the examination, limiting the acquisition of images in the diagnostic sensitivity and specificity of the examination. Additionally, today's study is limited by lack of IV gadolinium for detection and characterization of visceral  and/or vascular lesions. Lower chest: Unremarkable. Hepatobiliary: No definite cystic or solid hepatic lesions are confidently identified on today's noncontrast examination. Gallbladder is moderately distended, but otherwise unremarkable in appearance. No filling defects in the gallbladder to suggest gallstones. There is severe intra and extrahepatic biliary ductal dilatation. Common bile duct measures up to 15 mm in the porta hepatis and is abruptly cut off proximally. The obstruction of the gallbladder appears extrinsic secondary to a pancreatic head mass (discussed below). Pancreas: In the head of the pancreas (axial image 22 of series 6 and coronal image 19 of series 4) there is a 4.8 x 3.3 x 5.4 cm lesion that is heterogeneous in signal intensity on T2 weighted images with multiple internal areas of T2 hyperintensity, favored to reflect the presence of innumerable small cystic areas, concerning for pancreatic neoplasm. This appears to occlude the common bile duct. Pancreatic duct does not appear occluded, and is normal in caliber. Body and tail of the pancreas are unremarkable in appearance. Spleen:  Unremarkable. Adrenals/Urinary  Tract: Multiple T2 hyperintense lesions in both kidneys, incompletely characterized on today's examination, but favored to represent cysts, measuring up to 1.9 cm in the medial aspect of the upper pole the right kidney. No hydroureteronephrosis in the visualized portions of the abdomen. Bilateral adrenal glands are normal in appearance. Stomach/Bowel: Visualized portions are unremarkable. Vascular/Lymphatic: No aneurysm identified in the visualized abdominal vasculature. No definite lymphadenopathy noted in the visualized abdomen. Other: No significant volume of ascites noted in the visualized portions of the abdomen. Small supraumbilical ventral hernia containing only omental fat. Musculoskeletal: No aggressive appearing osseous lesions are noted in the visualized portions of the skeleton. IMPRESSION: 1. 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 At this time, this is associated with severe intra and extrahepatic biliary ductal dilatation. This is poorly evaluated on today's incomplete noncontrast abdominal MRI. Further evaluation with endoscopic ultrasound is strongly recommended for definitive diagnosis. Electronically Signed   By: Vinnie Langton M.D.   On: 07/28/2018 15:10   Mr 3d Recon At Scanner  Result Date: 07/28/2018 CLINICAL DATA:  68 year old male with history of right-sided flank pain and abnormal liver function tests. EXAM: MRI ABDOMEN WITHOUT CONTRAST  (INCLUDING MRCP) TECHNIQUE: Multiplanar multisequence MR imaging of the abdomen was performed. Heavily T2-weighted images of the biliary and pancreatic ducts were obtained, and three-dimensional MRCP images were rendered by post processing. COMPARISON:  No prior abdominal MRI. CT the abdomen and pelvis 07/27/2018. Abdominal ultrasound 07/27/2018. FINDINGS: Comment: Per report from the technologist, the patient refused to continue part way through the examination, limiting the  acquisition of images in the diagnostic sensitivity and specificity of the examination. Additionally, today's study is limited by lack of IV gadolinium for detection and characterization of visceral and/or vascular lesions. Lower chest: Unremarkable. Hepatobiliary: No definite cystic or solid hepatic lesions are confidently identified on today's noncontrast examination. Gallbladder is moderately distended, but otherwise unremarkable in appearance. No filling defects in the gallbladder to suggest gallstones. There is severe intra and extrahepatic biliary ductal dilatation. Common bile duct measures up to 15 mm in the porta hepatis and is abruptly cut off proximally. The obstruction of the gallbladder appears extrinsic secondary to a pancreatic head mass (discussed below). Pancreas: In the head of the pancreas (axial image 22 of series 6 and coronal image 19 of series 4) there is a 4.8 x 3.3 x 5.4 cm lesion that is heterogeneous in  signal intensity on T2 weighted images with multiple internal areas of T2 hyperintensity, favored to reflect the presence of innumerable small cystic areas, concerning for pancreatic neoplasm. This appears to occlude the common bile duct. Pancreatic duct does not appear occluded, and is normal in caliber. Body and tail of the pancreas are unremarkable in appearance. Spleen:  Unremarkable. Adrenals/Urinary Tract: Multiple T2 hyperintense lesions in both kidneys, incompletely characterized on today's examination, but favored to represent cysts, measuring up to 1.9 cm in the medial aspect of the upper pole the right kidney. No hydroureteronephrosis in the visualized portions of the abdomen. Bilateral adrenal glands are normal in appearance. Stomach/Bowel: Visualized portions are unremarkable. Vascular/Lymphatic: No aneurysm identified in the visualized abdominal vasculature. No definite lymphadenopathy noted in the visualized abdomen. Other: No significant volume of ascites noted in the  visualized portions of the abdomen. Small supraumbilical ventral hernia containing only omental fat. Musculoskeletal: No aggressive appearing osseous lesions are noted in the visualized portions of the skeleton. IMPRESSION: 1. 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 At this time, this is associated with severe intra and extrahepatic biliary ductal dilatation. This is poorly evaluated on today's incomplete noncontrast abdominal MRI. Further evaluation with endoscopic ultrasound is strongly recommended for definitive diagnosis. Electronically Signed   By: Vinnie Langton M.D.   On: 07/28/2018 15:10   Dg Ercp  Result Date: 07/30/2018 CLINICAL DATA:  ERCP with biliary brush biopsy and stent placement EXAM: ERCP TECHNIQUE: Multiple spot images obtained with the fluoroscopic device and submitted for interpretation post-procedure. COMPARISON:  MRCP-07/28/2018 FLUOROSCOPY TIME:  3 minutes, 5 seconds FINDINGS: Thirteen spot fluoroscopic images of the right upper abdominal quadrant during ERCP are provided for review Initial image demonstrates an ERCP probe overlying the right upper abdominal quadrant. Subsequent image demonstrates selective cannulation of the pancreatic and common bile ducts. Contrast injection demonstrates moderate to long segment narrowing of the distal aspect of the CBD with upstream dilatation of the cystic and proper hepatic ducts. There is minimal opacification of the left side of the intrahepatic biliary tree which appears mildly dilated and somewhat irregular. Completion image demonstrates placement of an internal biliary stent overlying the distal CBD traversing the segment of narrowing of the CBD. IMPRESSION: ERCP with biliary stent placement as above. These images were submitted for radiologic interpretation only. Please see the procedural report for the amount of contrast and the fluoroscopy time utilized. Electronically  Signed   By: Sandi Mariscal M.D.   On: 07/30/2018 11:27   Ct Renal Stone Study  Result Date: 07/27/2018 CLINICAL DATA:  Right flank pain and discolored urine for 4-5 days. EXAM: CT ABDOMEN AND PELVIS WITHOUT CONTRAST TECHNIQUE: Multidetector CT imaging of the abdomen and pelvis was performed following the standard protocol without IV contrast. COMPARISON:  None. FINDINGS: Lower chest: There is cardiomegaly. No pleural or pericardial effusion. Lung bases are clear. Hepatobiliary: No focal liver abnormality is seen. No gallstones, gallbladder wall thickening, or biliary dilatation. Pancreas: Unremarkable. No pancreatic ductal dilatation or surrounding inflammatory changes. Spleen: Normal in size without focal abnormality. Adrenals/Urinary Tract: Adrenal glands are unremarkable. Kidneys are normal, without renal calculi, focal solid lesion, or hydronephrosis. Small right renal cyst is noted. Bladder is unremarkable. Stomach/Bowel: Surgical anastomosis at the junction of the descending and sigmoid colon noted. Scattered diverticulosis is seen throughout the colon but there is no evidence of diverticulitis. The appendix is not visualized and may have been removed.  Small hiatal hernia is noted. The stomach is otherwise unremarkable. Small bowel loops appear normal. Vascular/Lymphatic: Aortic atherosclerosis. No enlarged abdominal or pelvic lymph nodes. Reproductive: Prostate gland is enlarged. Other: Small fat containing supraumbilical hernia is identified. Musculoskeletal: No fracture or worrisome lesion. Facet degenerative disease results in 0.7 cm anterolisthesis L5 on S1. IMPRESSION: Negative for urinary tract stone. No acute abnormality abdomen or pelvis. Cardiomegaly. Atherosclerosis. Diverticulosis without diverticulitis. Fat containing supraumbilical hernia. Lower lumbar spondylosis. Electronically Signed   By: Inge Rise M.D.   On: 07/27/2018 12:39   Vas US Aorta/ivc/iliacs  Result Date:  07/27/2018 ABDOMINAL AORTA STUDY Indications: Patient c/o right flank discomfort. Risk Factors: Hypertension, hyperlipidemia, Diabetes, past history of smoking. Limitations: Air/bowel gas.  Comparison Study: 06/28/14 at [facility], visceral duplex showed no AAA, but did show                   increased left common iliac artery size at 1.5 cm x 1.6 cm,                   and elevated velocity of 229 cm/sec. Performing Technologist: [employer] BS, RVT, RDCS  Examination Guidelines: A complete evaluation includes B-mode imaging, spectral Doppler, color Doppler, and power Doppler as needed of all accessible portions of each vessel. Bilateral testing is considered an integral part of a complete examination. Limited examinations for reoccurring indications may be performed as noted.  Abdominal Aorta Findings: +-------------+-------+----------+----------+--------+--------+--------+ Location     AP (cm)Trans (cm)PSV (cm/s)WaveformThrombusComments +-------------+-------+----------+----------+--------+--------+--------+ Proximal     2.10   2.20      110                                +-------------+-------+----------+----------+--------+--------+--------+ Mid          1.90   1.90      67                                 +-------------+-------+----------+----------+--------+--------+--------+ Distal       1.80   2.00      43                                 +-------------+-------+----------+----------+--------+--------+--------+ RT CIA Prox  1.6    1.7       101                                +-------------+-------+----------+----------+--------+--------+--------+ RT CIA Mid                    76                                 +-------------+-------+----------+----------+--------+--------+--------+ RT CIA Distal                 98                                 +-------------+-------+----------+----------+--------+--------+--------+ RT EIA Prox  1.0    1.1       104                                 +-------------+-------+----------+----------+--------+--------+--------+  RT EIA Mid                    77                                 +-------------+-------+----------+----------+--------+--------+--------+ RT EIA Distal                 72                                 +-------------+-------+----------+----------+--------+--------+--------+ LT CIA Prox  1.6    1.6       153                                +-------------+-------+----------+----------+--------+--------+--------+ LT CIA Mid                    219                                +-------------+-------+----------+----------+--------+--------+--------+ LT EIA Prox  1.1    1.1       74                                 +-------------+-------+----------+----------+--------+--------+--------+ LT EIA Mid                    87                                 +-------------+-------+----------+----------+--------+--------+--------+ LT EIA Distal                 73                                 +-------------+-------+----------+----------+--------+--------+--------+  Visualization of the Left CIA Distal artery was limited. Limited imaging of the right flank reveals an enlarged gall bladder, measuring 12 cm in length and 4.5 cm in width (normal values are respectively 7-10 cm long, and 2.8-3.9 wide). IVC/Iliac Findings: +--------+------+--------+--------+   IVC   PatentThrombusComments +--------+------+--------+--------+ IVC Proxpatent                 +--------+------+--------+--------+  Summary: Abdominal Aorta: No evidence of an abdominal aortic aneurysm was visualized. 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 The largest aortic measurement is 2.2 cm. 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  *See table(s) above for measurements and observations. Suggest follow up study in 12 months.  Electronically signed by Ida Rogue MD on 07/27/2018 at 10:27:46 PM.    Final    US Abdomen Limited Ruq  Result Date: 07/27/2018 CLINICAL DATA:  Elevated LFT EXAM: ULTRASOUND ABDOMEN LIMITED RIGHT UPPER QUADRANT COMPARISON:  CT 07/27/2018 FINDINGS: Gallbladder: Moderate sludge in the gallbladder. Normal wall thickness. Negative sonographic Murphy. Common bile duct: Diameter: 10 mm Liver: Increased echogenicity. Mild intra hepatic biliary dilatation. No focal hepatic  abnormality. Portal vein is patent on color Doppler imaging with normal direction of blood flow towards the liver. Isoechoic area surrounding the right kidney, no correlate on CT, may represent prominent retroperitoneal fat deposition. IMPRESSION: 1. Moderate sludge in the gallbladder without sonographic evidence for an acute cholecystitis 2. Mild intra hepatic biliary dilatation with enlarged extrahepatic common bile duct up to 1 cm. Further evaluation with MRCP as clinically indicated 3. Echogenic liver suggesting steatosis and or hepatocellular disease. Electronically Signed   By: Donavan Foil M.D.   On: 07/27/2018 20:03     TODAY-DAY OF DISCHARGE:  Subjective:   [name] [name] today has no headache,no chest abdominal pain,no new weakness tingling or numbness, feels much better wants to go home today.   Objective:   Blood pressure (!) 142/75, pulse 88, temperature 98.4 F (36.9 C), temperature source Oral, resp. rate 18, height 5\' 10"  (1.778 m), weight 88.4 kg, SpO2 98 %.  Intake/Output Summary (Last 24 hours) at 07/31/2018 0923 Last data filed at 07/30/2018 1500 Gross per 24 hour  Intake 1260 ml  Output -  Net 1260 ml   Filed Weights   07/27/18 2034  Weight: 88.4 kg     Exam: Awake Alert, Oriented *3, No new F.N deficits, Normal affect [place].AT,PERRAL Supple Neck,No JVD, No cervical lymphadenopathy appriciated.  Symmetrical Chest wall movement, Good air movement bilaterally, CTAB RRR,No Gallops,Rubs or new Murmurs, No Parasternal Heave +ve B.Sounds, Abd Soft, Non tender, No organomegaly appriciated, No rebound -guarding or rigidity. No Cyanosis, Clubbing or edema, No new Rash or bruise   PERTINENT RADIOLOGIC STUDIES: Mr Abdomen Mrcp Wo Contrast  Result Date: 07/28/2018 CLINICAL DATA:  69 year old male with history of right-sided flank pain and abnormal liver function tests. EXAM: MRI ABDOMEN WITHOUT CONTRAST  (INCLUDING MRCP) TECHNIQUE: Multiplanar multisequence MR imaging of the abdomen was performed. Heavily T2-weighted images of the biliary and pancreatic ducts were obtained, and three-dimensional MRCP images were rendered by post processing. COMPARISON:  No prior abdominal MRI. CT the abdomen and pelvis 07/27/2018. Abdominal ultrasound 07/27/2018. FINDINGS: Comment: Per report from the technologist, the patient refused to continue part way through the examination, limiting the acquisition of images in the diagnostic sensitivity and specificity of the examination. Additionally, today's study is limited by lack of IV gadolinium for detection and characterization of visceral and/or vascular lesions. Lower chest: Unremarkable. Hepatobiliary: No definite cystic or solid hepatic lesions are confidently identified on today's noncontrast examination. Gallbladder is moderately distended, but otherwise unremarkable in appearance. No filling defects in the gallbladder to suggest gallstones. There is severe intra and extrahepatic biliary ductal dilatation. Common bile duct measures up to 15 mm in the porta hepatis and is abruptly cut off proximally. The obstruction of the gallbladder appears extrinsic secondary to a pancreatic head mass (discussed below). Pancreas: In the  head of the pancreas (axial image 22 of series 6 and coronal image 19 of series 4) there is a 4.8 x 3.3 x 5.4 cm lesion that is heterogeneous in signal intensity on T2 weighted images with multiple internal areas of T2 hyperintensity, favored to reflect the presence of innumerable small cystic areas, concerning for pancreatic neoplasm. This appears to occlude the common bile duct. Pancreatic duct does not appear occluded, and is normal in caliber. Body and tail of the pancreas are unremarkable in appearance. Spleen:  Unremarkable. Adrenals/Urinary Tract: Multiple T2 hyperintense lesions in both kidneys, incompletely characterized on today's examination, but favored to represent cysts, measuring up to 1.9 cm in the medial aspect  of the upper pole the right kidney. No hydroureteronephrosis in the visualized portions of the abdomen. Bilateral adrenal glands are normal in appearance. Stomach/Bowel: Visualized portions are unremarkable. Vascular/Lymphatic: No aneurysm identified in the visualized abdominal vasculature. No definite lymphadenopathy noted in the visualized abdomen. Other: No significant volume of ascites noted in the visualized portions of the abdomen. Small supraumbilical ventral hernia containing only omental fat. Musculoskeletal: No aggressive appearing osseous lesions are noted in the visualized portions of the skeleton. IMPRESSION: 1. 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 At this time, this is associated with severe intra and extrahepatic biliary ductal dilatation. This is poorly evaluated on today's incomplete noncontrast abdominal MRI. Further evaluation with endoscopic ultrasound is strongly recommended for definitive diagnosis. Electronically Signed   By: Vinnie Langton M.D.   On: 07/28/2018 15:10   Mr 3d Recon At Scanner  Result Date: 07/28/2018 CLINICAL DATA:  68 year old male with history of right-sided flank pain and  abnormal liver function tests. EXAM: MRI ABDOMEN WITHOUT CONTRAST  (INCLUDING MRCP) TECHNIQUE: Multiplanar multisequence MR imaging of the abdomen was performed. Heavily T2-weighted images of the biliary and pancreatic ducts were obtained, and three-dimensional MRCP images were rendered by post processing. COMPARISON:  No prior abdominal MRI. CT the abdomen and pelvis 07/27/2018. Abdominal ultrasound 07/27/2018. FINDINGS: Comment: Per report from the technologist, the patient refused to continue part way through the examination, limiting the acquisition of images in the diagnostic sensitivity and specificity of the examination. Additionally, today's study is limited by lack of IV gadolinium for detection and characterization of visceral and/or vascular lesions. Lower chest: Unremarkable. Hepatobiliary: No definite cystic or solid hepatic lesions are confidently identified on today's noncontrast examination. Gallbladder is moderately distended, but otherwise unremarkable in appearance. No filling defects in the gallbladder to suggest gallstones. There is severe intra and extrahepatic biliary ductal dilatation. Common bile duct measures up to 15 mm in the porta hepatis and is abruptly cut off proximally. The obstruction of the gallbladder appears extrinsic secondary to a pancreatic head mass (discussed below). Pancreas: In the head of the pancreas (axial image 22 of series 6 and coronal image 19 of series 4) there is a 4.8 x 3.3 x 5.4 cm lesion that is heterogeneous in signal intensity on T2 weighted images with multiple internal areas of T2 hyperintensity, favored to reflect the presence of innumerable small cystic areas, concerning for pancreatic neoplasm. This appears to occlude the common bile duct. Pancreatic duct does not appear occluded, and is normal in caliber. Body and tail of the pancreas are unremarkable in appearance. Spleen:  Unremarkable. Adrenals/Urinary Tract: Multiple T2 hyperintense lesions in both  kidneys, incompletely characterized on today's examination, but favored to represent cysts, measuring up to 1.9 cm in the medial aspect of the upper pole the right kidney. No hydroureteronephrosis in the visualized portions of the abdomen. Bilateral adrenal glands are normal in appearance. Stomach/Bowel: Visualized portions are unremarkable. Vascular/Lymphatic: No aneurysm identified in the visualized abdominal vasculature. No definite lymphadenopathy noted in the visualized abdomen. Other: No significant volume of ascites noted in the visualized portions of the abdomen. Small supraumbilical ventral hernia containing only omental fat. Musculoskeletal: No aggressive appearing osseous lesions are noted in the visualized portions of the skeleton. IMPRESSION: 1. 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 At this time, this is associated with severe intra and  extrahepatic biliary ductal dilatation. This is poorly evaluated on today's incomplete noncontrast abdominal MRI. Further evaluation with endoscopic ultrasound is strongly recommended for definitive diagnosis. Electronically Signed   By: Vinnie Langton M.D.   On: 07/28/2018 15:10   Dg Ercp  Result Date: 07/30/2018 CLINICAL DATA:  ERCP with biliary brush biopsy and stent placement EXAM: ERCP TECHNIQUE: Multiple spot images obtained with the fluoroscopic device and submitted for interpretation post-procedure. COMPARISON:  MRCP-07/28/2018 FLUOROSCOPY TIME:  3 minutes, 5 seconds FINDINGS: Thirteen spot fluoroscopic images of the right upper abdominal quadrant during ERCP are provided for review Initial image demonstrates an ERCP probe overlying the right upper abdominal quadrant. Subsequent image demonstrates selective cannulation of the pancreatic and common bile ducts. Contrast injection demonstrates moderate to long segment narrowing of the distal aspect of the CBD with upstream dilatation of the  cystic and proper hepatic ducts. There is minimal opacification of the left side of the intrahepatic biliary tree which appears mildly dilated and somewhat irregular. Completion image demonstrates placement of an internal biliary stent overlying the distal CBD traversing the segment of narrowing of the CBD. IMPRESSION: ERCP with biliary stent placement as above. These images were submitted for radiologic interpretation only. Please see the procedural report for the amount of contrast and the fluoroscopy time utilized. Electronically Signed   By: Sandi Mariscal M.D.   On: 07/30/2018 11:27   Ct Renal Stone Study  Result Date: 07/27/2018 CLINICAL DATA:  Right flank pain and discolored urine for 4-5 days. EXAM: CT ABDOMEN AND PELVIS WITHOUT CONTRAST TECHNIQUE: Multidetector CT imaging of the abdomen and pelvis was performed following the standard protocol without IV contrast. COMPARISON:  None. FINDINGS: Lower chest: There is cardiomegaly. No pleural or pericardial effusion. Lung bases are clear. Hepatobiliary: No focal liver abnormality is seen. No gallstones, gallbladder wall thickening, or biliary dilatation. Pancreas: Unremarkable. No pancreatic ductal dilatation or surrounding inflammatory changes. Spleen: Normal in size without focal abnormality. Adrenals/Urinary Tract: Adrenal glands are unremarkable. Kidneys are normal, without renal calculi, focal solid lesion, or hydronephrosis. Small right renal cyst is noted. Bladder is unremarkable. Stomach/Bowel: Surgical anastomosis at the junction of the descending and sigmoid colon noted. Scattered diverticulosis is seen throughout the colon but there is no evidence of diverticulitis. The appendix is not visualized and may have been removed. Small hiatal hernia is noted. The stomach is otherwise unremarkable. Small bowel loops appear normal. Vascular/Lymphatic: Aortic atherosclerosis. No enlarged abdominal or pelvic lymph nodes. Reproductive: Prostate gland is  enlarged. Other: Small fat containing supraumbilical hernia is identified. Musculoskeletal: No fracture or worrisome lesion. Facet degenerative disease results in 0.7 cm anterolisthesis L5 on S1. IMPRESSION: Negative for urinary tract stone. No acute abnormality abdomen or pelvis. Cardiomegaly. Atherosclerosis. Diverticulosis without diverticulitis. Fat containing supraumbilical hernia. Lower lumbar spondylosis. Electronically Signed   By: Inge Rise M.D.   On: 07/27/2018 12:39   Vas US Aorta/ivc/iliacs  Result Date: 07/27/2018 ABDOMINAL AORTA STUDY Indications: Patient c/o right flank discomfort. Risk Factors: Hypertension, hyperlipidemia, Diabetes, past history of smoking. Limitations: Air/bowel gas.  Comparison Study: 06/28/14 at [facility], visceral duplex showed no AAA, but did show                   increased left common iliac artery size at 1.5 cm x 1.6 cm,                   and elevated velocity of 229 cm/sec. Performing Technologist: Hester Mates BS, RVT, RDCS  Examination Guidelines: A  complete evaluation includes B-mode imaging, spectral Doppler, color Doppler, and power Doppler as needed of all accessible portions of each vessel. Bilateral testing is considered an integral part of a complete examination. Limited examinations for reoccurring indications may be performed as noted.  Abdominal Aorta Findings: +-------------+-------+----------+----------+--------+--------+--------+ Location     AP (cm)Trans (cm)PSV (cm/s)WaveformThrombusComments +-------------+-------+----------+----------+--------+--------+--------+ Proximal     2.10   2.20      110                                +-------------+-------+----------+----------+--------+--------+--------+ Mid          1.90   1.90      67                                 +-------------+-------+----------+----------+--------+--------+--------+ Distal       1.80   2.00      43                                  +-------------+-------+----------+----------+--------+--------+--------+ RT CIA Prox  1.6    1.7       101                                +-------------+-------+----------+----------+--------+--------+--------+ RT CIA Mid                    76                                 +-------------+-------+----------+----------+--------+--------+--------+ RT CIA Distal                 98                                 +-------------+-------+----------+----------+--------+--------+--------+ RT EIA Prox  1.0    1.1       104                                +-------------+-------+----------+----------+--------+--------+--------+ RT EIA Mid                    77                                 +-------------+-------+----------+----------+--------+--------+--------+ RT EIA Distal                 72                                 +-------------+-------+----------+----------+--------+--------+--------+ LT CIA Prox  1.6    1.6       153                                +-------------+-------+----------+----------+--------+--------+--------+ LT CIA Mid                    219                                +-------------+-------+----------+----------+--------+--------+--------+  LT EIA Prox  1.1    1.1       74                                 +-------------+-------+----------+----------+--------+--------+--------+ LT EIA Mid                    87                                 +-------------+-------+----------+----------+--------+--------+--------+ LT EIA Distal                 73                                 +-------------+-------+----------+----------+--------+--------+--------+  Visualization of the Left CIA Distal artery was limited. Limited imaging of the right flank reveals an enlarged gall bladder, measuring 12 cm in length and 4.5 cm in width (normal values are respectively 7-10 cm long, and 2.8-3.9 wide). IVC/Iliac Findings:  +--------+------+--------+--------+   IVC   PatentThrombusComments +--------+------+--------+--------+ IVC Proxpatent                 +--------+------+--------+--------+  Summary: Abdominal Aorta: No evidence of an abdominal aortic aneurysm was visualized. 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 The largest aortic measurement is 2.2 cm. 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  *See table(s) above for measurements and observations. Suggest follow up study in 12 months.  Electronically signed by Ida Rogue MD on 07/27/2018 at 10:27:46 PM.    Final    US Abdomen Limited Ruq  Result Date: 07/27/2018 CLINICAL DATA:  Elevated LFT EXAM: ULTRASOUND ABDOMEN LIMITED RIGHT UPPER QUADRANT COMPARISON:  CT 07/27/2018 FINDINGS: Gallbladder: Moderate sludge in the gallbladder. Normal wall thickness. Negative sonographic Murphy. Common bile duct: Diameter: 10 mm Liver: Increased echogenicity. Mild intra hepatic biliary dilatation. No focal hepatic abnormality. Portal vein is patent on color Doppler imaging with normal direction of blood flow towards the liver. Isoechoic area surrounding the right kidney, no correlate on CT, may represent prominent retroperitoneal fat deposition. IMPRESSION: 1. Moderate sludge in the gallbladder without sonographic evidence for an acute cholecystitis 2. Mild intra hepatic biliary dilatation with enlarged extrahepatic common bile duct up to 1 cm. Further evaluation with MRCP as clinically indicated 3. Echogenic liver suggesting steatosis and or hepatocellular disease. Electronically Signed   By: Donavan Foil M.D.   On: 07/27/2018 20:03     PERTINENT LAB  RESULTS: CBC: No results for input(s): WBC, HGB, HCT, PLT in the last 72 hours. CMET CMP     Component Value Date/Time   NA 139 07/31/2018 0217   K 3.8 07/31/2018 0217   CL 102 07/31/2018 0217   CO2 28 07/31/2018 0217   GLUCOSE 114 (H) 07/31/2018 0217   BUN 19 07/31/2018 0217   CREATININE 1.72 (H) 07/31/2018 0217   CALCIUM 8.9 07/31/2018 0217   PROT 7.6 07/31/2018 0217   ALBUMIN 3.4 (L) 07/31/2018 0217   AST 213 (H) 07/31/2018 0217   ALT 373 (H) 07/31/2018 0217   ALKPHOS 203 (H) 07/31/2018 0217   BILITOT  5.1 (H) 07/31/2018 0217   GFRNONAA 40 (L) 07/31/2018 0217   GFRAA 46 (L) 07/31/2018 0217    GFR Estimated Creatinine Clearance: 45.4 mL/min (A) (by C-G formula based on SCr of 1.72 mg/dL (H)). No results for input(s): LIPASE, AMYLASE in the last 72 hours. No results for input(s): CKTOTAL, CKMB, CKMBINDEX, TROPONINI in the last 72 hours. Invalid input(s): POCBNP No results for input(s): DDIMER in the last 72 hours. No results for input(s): HGBA1C in the last 72 hours. No results for input(s): CHOL, HDL, LDLCALC, TRIG, CHOLHDL, LDLDIRECT in the last 72 hours. No results for input(s): TSH, T4TOTAL, T3FREE, THYROIDAB in the last 72 hours.  Invalid input(s): FREET3 No results for input(s): VITAMINB12, FOLATE, FERRITIN, TIBC, IRON, RETICCTPCT in the last 72 hours. Coags: Recent Labs    07/29/18 0647  INR 1.02   Microbiology: No results found for this or any previous visit (from the past 240 hour(s)).  FURTHER DISCHARGE INSTRUCTIONS:  Get Medicines reviewed and adjusted: Please take all your medications with you for your next visit with your Primary MD  Laboratory/radiological data: Please request your Primary MD to go over all hospital tests and procedure/radiological results at the follow up, please ask your Primary MD to get all Hospital records sent to his/her office.  In some cases, they will be blood work, cultures and biopsy results pending at the time of your  discharge. Please request that your primary care M.D. goes through all the records of your hospital data and follows up on these results.  Also Note the following: If you experience worsening of your admission symptoms, develop shortness of breath, life threatening emergency, suicidal or homicidal thoughts you must seek medical attention immediately by calling 911 or calling your MD immediately  if symptoms less severe.  You must read complete instructions/literature along with all the possible adverse reactions/side effects for all the Medicines you take and that have been prescribed to you. Take any new Medicines after you have completely understood and accpet all the possible adverse reactions/side effects.   Do not drive when taking Pain medications or sleeping medications (Benzodaizepines)  Do not take more than prescribed Pain, Sleep and Anxiety Medications. It is not advisable to combine anxiety,sleep and pain medications without talking with your primary care practitioner  Special Instructions: If you have smoked or chewed Tobacco  in the last 2 yrs please stop smoking, stop any regular Alcohol  and or any Recreational drug use.  Wear Seat belts while driving.  Please note: You were cared for by a hospitalist during your hospital stay. Once you are discharged, your primary care physician will handle any further medical issues. Please note that NO REFILLS for any discharge medications will be authorized once you are discharged, as it is imperative that you return to your primary care physician (or establish a relationship with a primary care physician if you do not have one) for your post hospital discharge needs so that they can reassess your need for medications and monitor your lab values.  Total Time spent coordinating discharge including counseling, education and face to face time equals 35 minutes.  SignedOren Binet 07/31/2018 9:23 AM

## 2018-07-31 NOTE — Plan of Care (Signed)
  Problem: Education: Goal: Knowledge of General Education information will improve Description: Including pain rating scale, medication(s)/side effects and non-pharmacologic comfort measures Outcome: Adequate for Discharge   Problem: Health Behavior/Discharge Planning: Goal: Ability to manage health-related needs will improve Outcome: Adequate for Discharge   Problem: Clinical Measurements: Goal: Ability to maintain clinical measurements within normal limits will improve Outcome: Adequate for Discharge Goal: Will remain free from infection Outcome: Adequate for Discharge Goal: Diagnostic test results will improve Outcome: Adequate for Discharge Goal: Cardiovascular complication will be avoided Outcome: Adequate for Discharge   Problem: Activity: Goal: Risk for activity intolerance will decrease Outcome: Adequate for Discharge   Problem: Nutrition: Goal: Adequate nutrition will be maintained Outcome: Adequate for Discharge   Problem: Coping: Goal: Level of anxiety will decrease Outcome: Adequate for Discharge   

## 2018-07-31 NOTE — Progress Notes (Signed)
Kerney Elbe to be D/C'd  per MD order. Discussed with the patient and all questions fully answered.  VSS, Skin clean, dry and intact without evidence of skin break down, no evidence of skin tears noted.  IV catheter discontinued intact. Site without signs and symptoms of complications. Dressing and pressure applied.  An After Visit Summary was printed and given to the patient. Patient received prescription.  D/c education completed with patient/family including follow up instructions, medication list, d/c activities limitations if indicated, with other d/c instructions as indicated by MD - patient able to verbalize understanding, all questions fully answered.   Patient instructed to return to ED, call 911, or call MD for any changes in condition.   Patient to be escorted via [place], and D/C home via private auto.

## 2018-08-02 ENCOUNTER — Encounter (HOSPITAL_COMMUNITY): Payer: Self-pay | Admitting: Gastroenterology

## 2018-08-03 ENCOUNTER — Telehealth: Payer: Self-pay | Admitting: *Deleted

## 2018-08-03 NOTE — Telephone Encounter (Signed)
Pt was on patientping report admitted 07/27/18 to [facility]. On Friday;  07/31/18 11:24 AM pt was discharged from [facility] observation to Home. Will call pt to follow-up and make hosp follow-up appt if need.Marland KitchenJohny Chess

## 2018-08-03 NOTE — Telephone Encounter (Signed)
Called pt concerning hospital discharge from Friday 07/31/18. Pt states he have already made follow-up appt w/Dr. Ronnald Ramp for 08/13/18. Inform pt had some additional questions concerning discharge. Completed TCM below.[name] [name]  Transition Care Management Follow-up Telephone Call   Date discharged? 07/31/18   How have you been since you were released from the hospital? Pt states he is doing alright   Do you understand why you were in the hospital? YES   Do you understand the discharge instructions? YES   Where were you discharged to? Home   Items Reviewed:  Medications reviewed: YES  Allergies reviewed: YES  Dietary changes reviewed: YES, heart healthy  Referrals reviewed: YES, pt states he has made appt w/GI provider for September 01, 2018   Functional Questionnaire:   Activities of Daily Living (ADLs):   He states he are independent in the following: ambulation, bathing and hygiene, feeding, continence, grooming, toileting and dressing States he doesn't require assistance    Any transportation issues/concerns?: YES   Any patient concerns? NO   Confirmed importance and date/time of follow-up visits scheduled YES, appt 08/13/18   Provider Appointment booked with Dr. Ronnald Ramp   Confirmed with patient if condition begins to worsen call PCP or go to the ER.  Patient was given the office number and encouraged to call back with question or concerns.  : YES

## 2018-08-04 ENCOUNTER — Other Ambulatory Visit: Payer: Self-pay

## 2018-08-04 ENCOUNTER — Telehealth: Payer: Self-pay

## 2018-08-04 DIAGNOSIS — K8689 Other specified diseases of pancreas: Secondary | ICD-10-CM

## 2018-08-04 NOTE — Progress Notes (Signed)
08/13/18 10 am [facility]

## 2018-08-04 NOTE — Telephone Encounter (Signed)
EUS scheduled, pt instructed and medications reviewed.  Patient instructions mailed to home.  Patient to call with any questions or concerns.  Anti coag letter sent to Dr Stanford Breed

## 2018-08-04 NOTE — Telephone Encounter (Signed)
[facility] Pre-operative Risk Assessment     Request for surgical clearance:     Endoscopy Procedure  What type of surgery is being performed?     EUS  When is this surgery scheduled?     08/13/18  What type of clearance is required ?   Pharmacy  Are there any medications that need to be held prior to surgery and how long? Eliquis  Practice name and name of physician performing surgery?      [facility]  What is your office phone and fax number?      Phone- [phone]  [phone]  Anesthesia type (None, local, MAC, general) ?       MAC

## 2018-08-04 NOTE — Telephone Encounter (Signed)
-----   Message from Milus Banister, MD sent at 08/03/2018  4:46 PM EST ----- Regarding: RE: Follow-up need for EUS I am covering the hospitals next week and I'm sure I can find a time to get this done. He should also be referred to medical oncology and also to surgery to get those balls rolling.  Is he aware of the biliary brushing results?   Bali Lyn, can you find a time for upper EUS at [facility] or [facility] next week.  I would prefer that it be at a time when I have "Purple MD" backup in case inpatient service is very busy.   For pancreatic mass.    Thanks all  ----- Message ----- From: Irving Copas., MD Sent: 08/03/2018   4:04 PM EST To: Milus Banister, MD, Ladene Artist, MD, # Subject: RE: Follow-up need for EUS                     Sanari Offner, Please let Dan and I know what our availability is for next EUS spot. I can add someone to next Wednesday if there is still time in the slots at [facility] on my Wednesday.  We may have to adjust my PM clinic a bit but I think we could try and work it. Thanks. GM ----- Message ----- From: Ladene Artist, MD Sent: 08/03/2018   3:57 PM EST To: Milus Banister, MD, Willia Craze, NP, # Subject: RE: Follow-up need for EUS                     The bile duct cytology showed atypical cells. Luanna Salk is his primary gastroenterologist. Proceed with EUS / FNA as next step?  ----- Message ----- From: Milus Banister, MD Sent: 08/03/2018   7:58 AM EST To: Ladene Artist, MD, Willia Craze, NP, # Subject: RE: Follow-up need for EUS                     Agree with Gabe. Please let us know about final cytology results.   ----- Message ----- From: Irving Copas., MD Sent: 07/31/2018  12:06 PM EST To: Milus Banister, MD, Ladene Artist, MD, # Subject: Follow-up need for EUS                         Paula,If the patient's brushings come back positive then an urgent EUS would not be needed.I would wait for those to come back and then let us know.I know that  both Linna Hoff and my schedule for advanced procedures is relatively booked until February.But please let us know when they return.Gabe ----- Message ----- From: Willia Craze, NP Sent: 07/31/2018   9:51 AM EST To: Milus Banister, MD, #  Good morning,  This patient needs EUS / FNA at some point. Any takers?  Thanks PG

## 2018-08-04 NOTE — Telephone Encounter (Signed)
Will fwd to [facility] for rec's re: anticoagulation. Richardson Dopp, PA-C    08/04/2018 5:07 PM

## 2018-08-05 NOTE — Telephone Encounter (Signed)
The patient has been notified of this information and all questions answered. The pt has been advised of the information and verbalized understanding.    

## 2018-08-05 NOTE — Telephone Encounter (Signed)
Patient with diagnosis of Afib on Eliquis for anticoagulation.    Procedure: EUS Date of procedure: 08/13/18  CHADS2-VASc score of  6 (CHF, HTN, AGE, DM2, stroke/tia x 2, CAD, AGE, male)  CrCl 2ml/min  Per office protocol, patient can hold Eliquis for 24 hours prior to procedure.  With history of TIA would recommend resuming Eliquis as soon as safe post procedure.

## 2018-08-06 NOTE — Telephone Encounter (Signed)
   Primary Cardiologist: Kirk Ruths, MD  Chart reviewed as part of pre-operative protocol coverage. Patient was contacted 08/06/2018 in reference to pre-operative risk assessment for pending surgery as outlined below.  [name] [name] was last seen on 06/19/18 by Dr. Stanford Breed.  Since that day, [name] [name] has done well.  He has not perceived palpitations or fast heart beats. No chest discomfort or shortness of breath.   Therefore, based on ACC/AHA guidelines, the patient would be at acceptable risk for the planned procedure without further cardiovascular testing.   According to our pharmacist:        Patient with diagnosis of Afib on Eliquis for anticoagulation.    Procedure: EUS Date of procedure: 08/13/18  CHADS2-VASc score of  6 (CHF, HTN, AGE, DM2, stroke/tia x 2, CAD, AGE, male)  CrCl 9ml/min  Per office protocol, patient can hold Eliquis for 24 hours prior to procedure.  With history of TIA would recommend resuming Eliquis as soon as safe post procedure.            I will route this recommendation to the requesting party via Epic fax function and remove from pre-op pool.  Please call with questions.  [name], NP 08/06/2018, 4:25 PM

## 2018-08-10 ENCOUNTER — Telehealth: Payer: Self-pay | Admitting: Cardiology

## 2018-08-10 NOTE — Telephone Encounter (Signed)
Would continue apixaban for now.  If rash does not continue to improve and resolve then call us again and we will consider changing to a different anticoagulant.  Seems less likely since rash is improving.  Would hold apixaban 2 days prior to procedure and resume after when okay with GI. Kirk Ruths, MD

## 2018-08-10 NOTE — Telephone Encounter (Signed)
Advised patient, verbalized understanding  

## 2018-08-10 NOTE — Telephone Encounter (Signed)
New message   Pt c/o medication issue:  1. Name of Medication: Eliquis  2. How are you currently taking this medication (dosage and times per day)? Twice daily  3. Are you having a reaction (difficulty breathing--STAT)?no   4. What is your medication issue? Patient is having extreme itching and a rash from this medication. Please call to discuss.

## 2018-08-10 NOTE — Telephone Encounter (Signed)
Spoke with patient and he just finished up his first 30 day supply of Eliquis. He is concerned about bumps and itching he has been having on his shoulders, arms, and back as this is listed as possible side effects. He is unsure if this is secondary to his Eliquis or recent anesthesia. He has never had a reaction to anesthesia but this all started afterwards. His bumps and itching have improved but still present, was an 8 now a 2-5. Explained to patient since improving not likely Eliquis. He also wanted for Dr Stanford Breed to know he is having another upcoming procedure, upper endoscopy  and was told to hold his Eliquis 24 hours before. Will forward to Dr Stanford Breed for review.

## 2018-08-13 ENCOUNTER — Ambulatory Visit (HOSPITAL_COMMUNITY): Payer: Medicare Other | Admitting: Certified Registered Nurse Anesthetist

## 2018-08-13 ENCOUNTER — Other Ambulatory Visit: Payer: Self-pay

## 2018-08-13 ENCOUNTER — Ambulatory Visit (HOSPITAL_COMMUNITY): Payer: Medicare Other

## 2018-08-13 ENCOUNTER — Inpatient Hospital Stay: Payer: Medicare Other | Admitting: Internal Medicine

## 2018-08-13 ENCOUNTER — Inpatient Hospital Stay (HOSPITAL_COMMUNITY)
Admission: RE | Admit: 2018-08-13 | Discharge: 2018-08-13 | DRG: 919 | Disposition: A | Discharge status: Home / Self Care | Payer: Medicare Other | Attending: Internal Medicine | Admitting: Internal Medicine

## 2018-08-13 ENCOUNTER — Encounter (HOSPITAL_COMMUNITY): Payer: Self-pay

## 2018-08-13 ENCOUNTER — Encounter (HOSPITAL_COMMUNITY)
Admission: RE | Disposition: A | Discharge status: Home / Self Care | Payer: Self-pay | Source: Home / Self Care | Attending: Gastroenterology

## 2018-08-13 DIAGNOSIS — I4891 Unspecified atrial fibrillation: Secondary | ICD-10-CM | POA: Diagnosis present

## 2018-08-13 DIAGNOSIS — J449 Chronic obstructive pulmonary disease, unspecified: Secondary | ICD-10-CM | POA: Diagnosis present

## 2018-08-13 DIAGNOSIS — Z87891 Personal history of nicotine dependence: Secondary | ICD-10-CM

## 2018-08-13 DIAGNOSIS — E1122 Type 2 diabetes mellitus with diabetic chronic kidney disease: Secondary | ICD-10-CM | POA: Diagnosis present

## 2018-08-13 DIAGNOSIS — T85520A Displacement of bile duct prosthesis, initial encounter: Secondary | ICD-10-CM | POA: Diagnosis present

## 2018-08-13 DIAGNOSIS — R8569 Abnormal cytological findings in specimens from other digestive organs and abdominal cavity: Secondary | ICD-10-CM | POA: Diagnosis not present

## 2018-08-13 DIAGNOSIS — I10 Essential (primary) hypertension: Secondary | ICD-10-CM | POA: Diagnosis not present

## 2018-08-13 DIAGNOSIS — K828 Other specified diseases of gallbladder: Secondary | ICD-10-CM | POA: Diagnosis present

## 2018-08-13 DIAGNOSIS — Y848 Other medical procedures as the cause of abnormal reaction of the patient, or of later complication, without mention of misadventure at the time of the procedure: Secondary | ICD-10-CM | POA: Diagnosis not present

## 2018-08-13 DIAGNOSIS — K831 Obstruction of bile duct: Secondary | ICD-10-CM | POA: Diagnosis present

## 2018-08-13 DIAGNOSIS — K869 Disease of pancreas, unspecified: Secondary | ICD-10-CM | POA: Diagnosis not present

## 2018-08-13 DIAGNOSIS — Z4659 Encounter for fitting and adjustment of other gastrointestinal appliance and device: Secondary | ICD-10-CM | POA: Diagnosis not present

## 2018-08-13 DIAGNOSIS — K219 Gastro-esophageal reflux disease without esophagitis: Secondary | ICD-10-CM | POA: Diagnosis present

## 2018-08-13 DIAGNOSIS — R591 Generalized enlarged lymph nodes: Secondary | ICD-10-CM | POA: Diagnosis not present

## 2018-08-13 DIAGNOSIS — K861 Other chronic pancreatitis: Secondary | ICD-10-CM | POA: Diagnosis present

## 2018-08-13 DIAGNOSIS — N182 Chronic kidney disease, stage 2 (mild): Secondary | ICD-10-CM | POA: Diagnosis present

## 2018-08-13 DIAGNOSIS — Z794 Long term (current) use of insulin: Secondary | ICD-10-CM | POA: Diagnosis not present

## 2018-08-13 DIAGNOSIS — K8309 Other cholangitis: Secondary | ICD-10-CM | POA: Diagnosis present

## 2018-08-13 DIAGNOSIS — Z7901 Long term (current) use of anticoagulants: Secondary | ICD-10-CM | POA: Diagnosis not present

## 2018-08-13 DIAGNOSIS — K8689 Other specified diseases of pancreas: Secondary | ICD-10-CM

## 2018-08-13 DIAGNOSIS — R1031 Right lower quadrant pain: Secondary | ICD-10-CM | POA: Diagnosis not present

## 2018-08-13 DIAGNOSIS — F40240 Claustrophobia: Secondary | ICD-10-CM | POA: Diagnosis present

## 2018-08-13 HISTORY — PX: ESOPHAGOGASTRODUODENOSCOPY: SHX5428

## 2018-08-13 HISTORY — PX: EUS: SHX5427

## 2018-08-13 HISTORY — PX: ERCP: SHX5425

## 2018-08-13 HISTORY — PX: STENT REMOVAL: SHX6421

## 2018-08-13 HISTORY — PX: BILIARY STENT PLACEMENT: SHX5538

## 2018-08-13 HISTORY — PX: FINE NEEDLE ASPIRATION: SHX5430

## 2018-08-13 LAB — COMPREHENSIVE METABOLIC PANEL
ALT: 253 U/L — ABNORMAL HIGH (ref 0–44)
AST: 170 U/L — ABNORMAL HIGH (ref 15–41)
Albumin: 3.7 g/dL (ref 3.5–5.0)
Alkaline Phosphatase: 387 U/L — ABNORMAL HIGH (ref 38–126)
Anion gap: 14 (ref 5–15)
BUN: 40 mg/dL — ABNORMAL HIGH (ref 8–23)
CO2: 24 mmol/L (ref 22–32)
CREATININE: 2.04 mg/dL — AB (ref 0.61–1.24)
Calcium: 9.5 mg/dL (ref 8.9–10.3)
Chloride: 98 mmol/L (ref 98–111)
GFR calc Af Amer: 37 mL/min — ABNORMAL LOW (ref >60)
GFR calc non Af Amer: 32 mL/min — ABNORMAL LOW (ref >60)
Glucose, Bld: 138 mg/dL — ABNORMAL HIGH (ref 70–99)
Potassium: 3.6 mmol/L (ref 3.5–5.1)
Sodium: 136 mmol/L (ref 135–145)
Total Bilirubin: 23.3 mg/dL (ref 0.3–1.2)
Total Protein: 8.4 g/dL — ABNORMAL HIGH (ref 6.5–8.1)

## 2018-08-13 LAB — GLUCOSE, CAPILLARY: Glucose-Capillary: 126 mg/dL — ABNORMAL HIGH (ref 70–99)

## 2018-08-13 SURGERY — UPPER ENDOSCOPIC ULTRASOUND (EUS) RADIAL
Anesthesia: Monitor Anesthesia Care

## 2018-08-13 MED ORDER — CIPROFLOXACIN IN D5W 400 MG/200ML IV SOLN
INTRAVENOUS | Status: AC
  Filled 2018-08-13: qty 200

## 2018-08-13 MED ORDER — CIPROFLOXACIN IN D5W 400 MG/200ML IV SOLN
INTRAVENOUS | Status: DC | PRN
  Administered 2018-08-13: 400 mg via INTRAVENOUS

## 2018-08-13 MED ORDER — SUGAMMADEX SODIUM 200 MG/2ML IV SOLN
INTRAVENOUS | Status: DC | PRN
  Administered 2018-08-13: 200 mg via INTRAVENOUS

## 2018-08-13 MED ORDER — PROPOFOL 10 MG/ML IV BOLUS
INTRAVENOUS | Status: DC | PRN
  Administered 2018-08-13: 200 mg via INTRAVENOUS

## 2018-08-13 MED ORDER — EPHEDRINE SULFATE-NACL 50-0.9 MG/10ML-% IV SOSY
PREFILLED_SYRINGE | INTRAVENOUS | Status: DC | PRN
  Administered 2018-08-13: 10 mg via INTRAVENOUS
  Administered 2018-08-13: 5 mg via INTRAVENOUS

## 2018-08-13 MED ORDER — INDOMETHACIN 50 MG RE SUPP
RECTAL | Status: DC | PRN
  Administered 2018-08-13: 100 mg via RECTAL

## 2018-08-13 MED ORDER — PHENYLEPHRINE 40 MCG/ML (10ML) SYRINGE FOR IV PUSH (FOR BLOOD PRESSURE SUPPORT)
PREFILLED_SYRINGE | INTRAVENOUS | Status: DC | PRN
  Administered 2018-08-13 (×3): 80 ug via INTRAVENOUS

## 2018-08-13 MED ORDER — FENTANYL CITRATE (PF) 100 MCG/2ML IJ SOLN
INTRAMUSCULAR | Status: DC | PRN
  Administered 2018-08-13 (×2): 25 ug via INTRAVENOUS

## 2018-08-13 MED ORDER — CIPROFLOXACIN HCL 500 MG PO TABS: 500.0000 mg | ORAL_TABLET | Freq: Two times a day (BID) | ORAL | 0 refills | Status: DC

## 2018-08-13 MED ORDER — PROPOFOL 10 MG/ML IV BOLUS
INTRAVENOUS | Status: AC
  Filled 2018-08-13: qty 40

## 2018-08-13 MED ORDER — DEXAMETHASONE SODIUM PHOSPHATE 10 MG/ML IJ SOLN
INTRAMUSCULAR | Status: DC | PRN
  Administered 2018-08-13: 5 mg via INTRAVENOUS

## 2018-08-13 MED ORDER — ONDANSETRON HCL 4 MG/2ML IJ SOLN
INTRAMUSCULAR | Status: DC | PRN
  Administered 2018-08-13: 4 mg via INTRAVENOUS

## 2018-08-13 MED ORDER — LACTATED RINGERS IV SOLN
INTRAVENOUS | Status: DC
  Administered 2018-08-13 (×2): via INTRAVENOUS

## 2018-08-13 MED ORDER — ROCURONIUM BROMIDE 50 MG/5ML IV SOSY
PREFILLED_SYRINGE | INTRAVENOUS | Status: DC | PRN
  Administered 2018-08-13: 20 mg via INTRAVENOUS
  Administered 2018-08-13: 30 mg via INTRAVENOUS
  Administered 2018-08-13 (×2): 20 mg via INTRAVENOUS

## 2018-08-13 MED ORDER — PHENYLEPHRINE HCL 10 MG/ML IJ SOLN
INTRAVENOUS | Status: DC | PRN
  Administered 2018-08-13: 35 ug/min via INTRAVENOUS

## 2018-08-13 MED ORDER — FENTANYL CITRATE (PF) 100 MCG/2ML IJ SOLN
INTRAMUSCULAR | Status: AC
  Filled 2018-08-13: qty 2

## 2018-08-13 MED ORDER — SODIUM CHLORIDE 0.9 % IV SOLN: INTRAVENOUS | Status: DC

## 2018-08-13 MED ORDER — LIDOCAINE 2% (20 MG/ML) 5 ML SYRINGE
INTRAMUSCULAR | Status: DC | PRN
  Administered 2018-08-13: 60 mg via INTRAVENOUS

## 2018-08-13 MED ORDER — SODIUM CHLORIDE 0.9 % IV SOLN
INTRAVENOUS | Status: DC | PRN
  Administered 2018-08-13: 80 mL

## 2018-08-13 MED ORDER — GLUCAGON HCL RDNA (DIAGNOSTIC) 1 MG IJ SOLR
INTRAMUSCULAR | Status: AC
  Filled 2018-08-13: qty 1

## 2018-08-13 MED ORDER — INDOMETHACIN 50 MG RE SUPP
RECTAL | Status: AC
  Filled 2018-08-13: qty 2

## 2018-08-13 NOTE — Progress Notes (Signed)
Received phone call from main lab about a critical value: bilirubin 23.3  Dr. Ardis Hughs made aware at this time.  Will continue to monitor patient.

## 2018-08-13 NOTE — Op Note (Signed)
[facility] Patient Name: [name] [name] Procedure Date: 08/13/2018 MRN: [id number] Attending MD: Milus Banister , MD Date of Birth: 09/14/1950 CSN: [id number] Age: 68 Admit Type: Outpatient Procedure:                Upper EUS Indications:              Mass in pancreas, obstructive jaundice, s/p ERCP 2                            weeks ago with Dr. Fuller Plan, biliary brushing cytology                            showed atypical cells. Now with clearly worse                            jaundice (d/c tbili was 5, today it is >20) Providers:                Milus Banister, MD, Carmie End, Jesusita Oka,                            RN, Cherylynn Ridges, Technician, Virgia Land, CRNA Referring MD:             Oletha Cruel, MD; Gerrit Heck, DO Medicines:                General Anesthesia, Cipro 458 mg IV Complications:            No immediate complications. Estimated blood loss:                            None. Estimated Blood Loss:     Estimated blood loss: none. Procedure:                Pre-Anesthesia Assessment:                           - Prior to the procedure, a History and Physical                            was performed, and patient medications and                            allergies were reviewed. The patient's tolerance of                            previous anesthesia was also reviewed. The risks                            and benefits of the procedure and the sedation                            options and risks were discussed with the patient.                            All questions were answered, and informed consent  was obtained. Prior Anticoagulants: The patient has                            taken Eliquis (apixaban), last dose was 3 days                            prior to procedure. ASA Grade Assessment: III - A                            patient with severe systemic disease. After                            reviewing the  risks and benefits, the patient was                            deemed in satisfactory condition to undergo the                            procedure.                           After obtaining informed consent, the endoscope was                            passed under direct vision. Throughout the                            procedure, the patient's blood pressure, pulse, and                            oxygen saturations were monitored continuously. The                            [id number] ([id number]) Olympus Linear EUS was                            introduced through the mouth, and advanced to the                            second part of duodenum. The [id number]-AL5 ([id number])                            Olympus Radial EUS was introduced through the                            mouth, and advanced to the second part of duodenum.                            The [id number]-[id number] ([id number]) Olympus gastroscope was                            introduced through the mouth, and advanced to the  second part of duodenum. The upper EUS was                            accomplished without difficulty. The patient                            tolerated the procedure well. Scope In: Scope Out: Findings:      ENDOSCOPIC FINDING: :      1. The previously placed plastic biliary stent appeared to have       migrated, there was 3-4cm of the stent extending into the duodenum. I       removed it with a snare      ENDOSONOGRAPHIC FINDING: :      1. Poor image quality overall precluded ideal visualization. There was a       large mixed (solid>cystic) mass in the head of pancreas with very poorly       defined outer margins, approximate greatest dimention 4.5cm across. The       remaining pancreatic parenchyma was abnormal, he appears to have chronic       pancreatitis. Given these limitations I was unable to confirm invasion       into major vascular structures. I sampled the mass with 5 transduodenal        passes with a 22 gauge EUS FNA needle.      2. Cystic duct and gallbaldder were dilated, filled with sludge.      3. CBD was slightly dilated (9-63mm).      4. Main pancreatic duct was normal, non-dilated.      5. NO peripancreatic adenopathy.      6. Limited views of the liver were normal. Impression:               - Previously placed biliary stent had migrated out                            of position endoscopically and his Tbili was >20                            this morning and so I removed the stent and it was                            sent for cytology review.                           - There was a large mixed (solid>cystic) mass in                            the head of pancreas with very poorly defined outer                            margins, approximate greatest dimention 4.5cm                            across. The remaining pancreatic parenchyma was                            abnormal,  he appears to have chronic pancreatitis.                            Given these limitations I was unable to confirm                            invasion into major vascular structures. I sampled                            the mass with 5 transduodenal passes with a 22                            gauge EUS FNA needle. Preliminary cytology review                            shows atypical cells. Moderate Sedation:      Not Applicable - Patient had care per Anesthesia. Recommendation:           - ERCP now to place new biliary stent.                           - Await final path results.                           - His Cr has risen since discharge, may benefit                            from brief stay in hosp for hydration, pain control. Procedure Code(s):        --- Professional ---                           [phone], Esophagogastroduodenoscopy, flexible,                            transoral; with transendoscopic ultrasound-guided                            intramural or transmural fine needle                             aspiration/biopsy(s), (includes endoscopic                            ultrasound examination limited to the esophagus,                            stomach or duodenum, and adjacent structures) Diagnosis Code(s):        --- Professional ---                           T18.3XXS, Foreign body in small intestine, sequela                           K86.89, Other specified diseases of pancreas  R59.1, Generalized enlarged lymph nodes CPT copyright 2018 American Medical Association. All rights reserved. The codes documented in this report are preliminary and upon coder review may  be revised to meet current compliance requirements. Milus Banister, MD 08/13/2018 12:38:44 PM This report has been signed electronically. Number of Addenda: 0

## 2018-08-13 NOTE — Op Note (Addendum)
[facility] Patient Name: [name] [name] Procedure Date: 08/13/2018 MRN: [id number] Attending MD: Milus Banister , MD Date of Birth: 01/30/1951 CSN: [id number] Age: 68 Admit Type: Outpatient Procedure:                ERCP Indications:              Malignant stricture of the common bile duct,                            Abdominal pain of suspected biliary origin Providers:                Owens Loffler, MD, Carmie End, RN, Burtis Junes,                            RN, Raynelle Bring, RN, Cherylynn Ridges, Technician,                            Virgia Land, CRNA Referring MD:             Lucio Edward, MD; Gerrit Heck, DO Medicines:                General Anesthesia, cipro 400mg  IV, Indomethacin                            397 mg PR Complications:            No immediate complications. Estimated blood loss:                            None Estimated Blood Loss:     Estimated blood loss: none. Procedure:                Pre-Anesthesia Assessment:                           - Prior to the procedure, a History and Physical                            was performed, and patient medications and                            allergies were reviewed. The patient's tolerance of                            previous anesthesia was also reviewed. The risks                            and benefits of the procedure and the sedation                            options and risks were discussed with the patient.                            All questions were answered, and informed consent  was obtained. Prior Anticoagulants: The patient has                            taken Eliquis (apixaban), last dose was 3 days                            prior to procedure. ASA Grade Assessment: III - A                            patient with severe systemic disease. After                            reviewing the risks and benefits, the patient was                            deemed in  satisfactory condition to undergo the                            procedure.                           After obtaining informed consent, the scope was                            passed under direct vision. Throughout the                            procedure, the patient's blood pressure, pulse, and                            oxygen saturations were monitored continuously. The                            [id number]-[id number] ([id number]) Olympus duodenoscope was                            introduced through the mouth, and used to inject                            contrast into and used to inject contrast into the                            bile duct. The ERCP was accomplished without                            difficulty. The patient tolerated the procedure                            well. Scope In: Scope Out: Findings:      The scout film was normal. The duodenoscope was advanced to the region       of the major papilla without detailed examination of the UGI tract. The       major papilla was edematous but not neoplastic appearing. A 44 autotome  over a .035 hydrawire was used to cannulate the bile duct and contrast       was injected. Cholangiogram revealed a 1cm long mid bile duct stricture.       The distal most 2cm of the CBD was normal appearing. The biliary tree       proximal to the stricture was diffusely dilated and the cystic duct       partially opacified. I brushed the biliary stricture for cytology and       then placed a 6cm long 65mm diameter fully covered SEMS in good       position, bridging the mid bile duct stricture. The distal most 1cm of       the stent extended across the major papilla into the duodenum. Mild       purulence passed from the bile duct into the dudoenum intermittently       throughout the case. The pancreatic duct was never injected with dye or       cannulated with a wire. Impression:               - The previously placed plastic biliary stent had                             migrated, was removed during the EUS portion of his                            day today. The stent was sent for cytology review.                           - Mid bile duct stricture was noted, brushed for                            cytology and stented with a 6cm long 37mm diameter                            fully covered SEMS in good postion.                           - Mild purulence passed from the bile duct into the                            dudoenum intermittently throughout the case. Moderate Sedation:      Not Applicable - Patient had care per Anesthesia. Recommendation:           - I considered brief hospital stay since his Cr had                            risen to 2 and there was mild purulence however he                            really prefers to go home. He will complete 5 days                            of twice daily cipro.                           -  Await cytology results from his tests today (bile                            duct brushing, biliary stent, FNA of pancreatic                            mass). Procedure Code(s):        --- Professional ---                           [phone], Endoscopic retrograde                            cholangiopancreatography (ERCP); with placement of                            endoscopic stent into biliary or pancreatic duct,                            including pre- and post-dilation and guide wire                            passage, when performed, including sphincterotomy,                            when performed, each stent Diagnosis Code(s):        --- Professional ---                           K83.1, Obstruction of bile duct                           R10.9, Unspecified abdominal pain CPT copyright 2018 American Medical Association. All rights reserved. The codes documented in this report are preliminary and upon coder review may  be revised to meet current compliance requirements. Milus Banister, MD 08/13/2018  1:36:26 PM This report has been signed electronically. Number of Addenda: 0

## 2018-08-13 NOTE — Anesthesia Preprocedure Evaluation (Addendum)
Anesthesia Evaluation  Patient identified by MRN, date of birth, ID band Patient awake    Reviewed: Allergy & Precautions, NPO status , Patient's Chart, lab work & pertinent test results  Airway Mallampati: III  TM Distance: >3 FB Neck ROM: Full    Dental  (+) Poor Dentition, Missing   Pulmonary asthma , COPD,  COPD inhaler, former smoker,    Pulmonary exam normal breath sounds clear to auscultation       Cardiovascular hypertension, Pt. on medications and Pt. on home beta blockers Normal cardiovascular exam+ dysrhythmias Atrial Fibrillation  Rhythm:Regular Rate:Normal  ECHO: LV EF: 50% - 55%  ECG: a-flutter, rate 66  Sees cardiologist (Cardiologist)    Neuro/Psych TIAnegative psych ROS   GI/Hepatic Neg liver ROS, GERD  Medicated and Controlled,Jaundice   Endo/Other  diabetes, Insulin Dependent, Oral Hypoglycemic Agents  Renal/GU Renal InsufficiencyRenal disease     Musculoskeletal negative musculoskeletal ROS (+)   Abdominal   Peds  Hematology negative hematology ROS (+)   Anesthesia Other Findings pancreatic mass  Reproductive/Obstetrics                            Anesthesia Physical Anesthesia Plan  ASA: III  Anesthesia Plan: General   Post-op Pain Management:    Induction: Intravenous  PONV Risk Score and Plan: 2 and Treatment may vary due to age or medical condition, Ondansetron and Dexamethasone  Airway Management Planned: Oral ETT  Additional Equipment:   Intra-op Plan:   Post-operative Plan: Extubation in OR  Informed Consent: I have reviewed the patients History and Physical, chart, labs and discussed the procedure including the risks, benefits and alternatives for the proposed anesthesia with the patient or authorized representative who has indicated his/her understanding and acceptance.     Dental advisory given  Plan Discussed with: CRNA  Anesthesia Plan  Comments:       Anesthesia Quick Evaluation

## 2018-08-13 NOTE — Anesthesia Postprocedure Evaluation (Signed)
Anesthesia Post Note  Patient: [name] [name]  Procedure(s) Performed: UPPER ENDOSCOPIC ULTRASOUND (EUS) RADIAL (N/A ) ENDOSCOPIC RETROGRADE CHOLANGIOPANCREATOGRAPHY (ERCP) (N/A ) STENT REMOVAL FINE NEEDLE ASPIRATION (FNA) LINEAR (N/A ) BILIARY STENT PLACEMENT (N/A ) BILIARY BRUSHING     Patient location during evaluation: PACU Anesthesia Type: General Level of consciousness: awake and alert Pain management: pain level controlled Vital Signs Assessment: post-procedure vital signs reviewed and stable Respiratory status: spontaneous breathing, nonlabored ventilation, respiratory function stable and patient connected to nasal cannula oxygen Cardiovascular status: blood pressure returned to baseline and stable Postop Assessment: no apparent nausea or vomiting Anesthetic complications: no    Last Vitals:  Vitals:   08/13/18 1350 08/13/18 1400  BP: 133/85 127/73  Pulse: 62 93  Resp: 16 20  Temp:    SpO2: 99% 96%    Last Pain:  Vitals:   08/13/18 1339  TempSrc: Oral  PainSc: 0-No pain                 Ayako Tapanes P Telsa Dillavou

## 2018-08-13 NOTE — H&P (Signed)
He is much more jaundice that I would expect him to be after biliary stent placment 2 weeks ago. I suspect his stent has either migrated or is occluded.  He will get CMET now, planning on adding ERCP to today's EUS.  He is aware, will change consent to reflect the change.

## 2018-08-13 NOTE — Interval H&P Note (Signed)
History and Physical Interval Note:  08/13/2018 9:45 AM  [name] [name]  has presented today for surgery, with the diagnosis of panc mass  The various methods of treatment have been discussed with the patient and family. After consideration of risks, benefits and other options for treatment, the patient has consented to  Procedure(s): UPPER ENDOSCOPIC ULTRASOUND (EUS) RADIAL (N/A) as a surgical intervention .  The patient's history has been reviewed, patient examined, no change in status, stable for surgery.  I have reviewed the patient's chart and labs.  Questions were answered to the patient's satisfaction.     Milus Banister

## 2018-08-13 NOTE — Transfer of Care (Signed)
Immediate Anesthesia Transfer of Care Note  Patient: [name] [name]  Procedure(s) Performed: UPPER ENDOSCOPIC ULTRASOUND (EUS) RADIAL (N/A ) ENDOSCOPIC RETROGRADE CHOLANGIOPANCREATOGRAPHY (ERCP) (N/A ) STENT REMOVAL FINE NEEDLE ASPIRATION (FNA) LINEAR (N/A ) BILIARY STENT PLACEMENT (N/A ) BILIARY BRUSHING  Patient Location: PACU  Anesthesia Type:General  Level of Consciousness: awake, alert  and oriented  Airway & Oxygen Therapy: Patient Spontanous Breathing and Patient connected to face mask oxygen  Post-op Assessment: Report given to RN and Post -op Vital signs reviewed and stable  Post vital signs: Reviewed and stable  Last Vitals:  Vitals Value Taken Time  BP    Temp    Pulse    Resp    SpO2      Last Pain:  Vitals:   08/13/18 0851  TempSrc: Oral  PainSc: 6          Complications: No apparent anesthesia complications

## 2018-08-13 NOTE — Anesthesia Procedure Notes (Signed)
Procedure Name: Intubation Date/Time: 08/13/2018 10:39 AM Performed by: Maxwell Caul, CRNA Pre-anesthesia Checklist: Patient identified, Emergency Drugs available, Suction available and Patient being monitored Patient Re-evaluated:Patient Re-evaluated prior to induction Oxygen Delivery Method: Circle system utilized Preoxygenation: Pre-oxygenation with 100% oxygen Induction Type: IV induction Ventilation: Mask ventilation without difficulty Laryngoscope Size: Mac and 4 Tube type: Oral Tube size: 7.5 mm Number of attempts: 1 Airway Equipment and Method: Stylet Placement Confirmation: ETT inserted through vocal cords under direct vision,  positive ETCO2 and breath sounds checked- equal and bilateral Secured at: 21 cm Tube secured with: Tape Dental Injury: Teeth and Oropharynx as per pre-operative assessment

## 2018-08-13 NOTE — Discharge Instructions (Signed)
YOU HAD AN ENDOSCOPIC PROCEDURE TODAY: Refer to the procedure report and other information in the discharge instructions given to you for any specific questions about what was found during the examination. If this information does not answer your questions, please call [place] office at [phone] to clarify.   YOU SHOULD EXPECT: Some feelings of bloating in the abdomen. Passage of more gas than usual. Walking can help get rid of the air that was put into your GI tract during the procedure and reduce the bloating. If you had a lower endoscopy (such as a colonoscopy or flexible sigmoidoscopy) you may notice spotting of blood in your stool or on the toilet paper. Some abdominal soreness may be present for a day or two, also.  DIET: Your first meal following the procedure should be a light meal and then it is ok to progress to your normal diet. A half-sandwich or bowl of soup is an example of a good first meal. Heavy or fried foods are harder to digest and may make you feel nauseous or bloated. Drink plenty of fluids but you should avoid alcoholic beverages for 24 hours. If you had a esophageal dilation, please see attached instructions for diet.    ACTIVITY: Your care partner should take you home directly after the procedure. You should plan to take it easy, moving slowly for the rest of the day. You can resume normal activity the day after the procedure however YOU SHOULD NOT DRIVE, use power tools, machinery or perform tasks that involve climbing or major physical exertion for 24 hours (because of the sedation medicines used during the test).   SYMPTOMS TO REPORT IMMEDIATELY: A gastroenterologist can be reached at any hour. Please call [phone]  for any of the following symptoms:   Following upper endoscopy (EGD, EUS, ERCP, esophageal dilation) Vomiting of blood or coffee ground material  New, significant abdominal pain  New, significant chest pain or pain under the shoulder blades  Painful or  persistently difficult swallowing  New shortness of breath  Black, tarry-looking or red, bloody stools  FOLLOW UP:  If any biopsies were taken you will be contacted by phone or by letter within the next 1-3 weeks. Call [phone]  if you have not heard about the biopsies in 3 weeks.  Please also call with any specific questions about appointments or follow up tests.  

## 2018-08-14 ENCOUNTER — Telehealth: Payer: Self-pay | Admitting: Cardiology

## 2018-08-14 NOTE — Telephone Encounter (Signed)
Spoke with patient and he is happy with Eliquis and would prefer to stay on that. Offered him a couple weeks of samples and he stated that would help him save up for deductible. Eliquis 5 mg #2 lot [id number] exp 6/22, patient aware.

## 2018-08-14 NOTE — Telephone Encounter (Signed)
New Message   Pt c/o medication issue:  1. Name of Medication: apixaban (ELIQUIS) 5 MG TABS tablet   2. How are you currently taking this medication (dosage and times per day)? Take 1 tablet (5 mg total) by mouth 2 (two) times daily.  3. Are you having a reaction (difficulty breathing--STAT)?   4. What is your medication issue? Patient is calling because he has not reached his medicare threshold for rx so the cost of the eliquis is $400. He can not afford that. He wants to know if there is anything else that he can take. Please call to discuss.

## 2018-08-17 ENCOUNTER — Encounter (HOSPITAL_COMMUNITY): Payer: Self-pay | Admitting: Gastroenterology

## 2018-08-19 ENCOUNTER — Encounter: Payer: Self-pay | Admitting: Internal Medicine

## 2018-08-19 ENCOUNTER — Other Ambulatory Visit (INDEPENDENT_AMBULATORY_CARE_PROVIDER_SITE_OTHER): Payer: Medicare Other

## 2018-08-19 ENCOUNTER — Ambulatory Visit (INDEPENDENT_AMBULATORY_CARE_PROVIDER_SITE_OTHER): Payer: Medicare Other | Admitting: Internal Medicine

## 2018-08-19 VITALS — BP 132/80 | HR 70 | Temp 97.6°F | Resp 16 | Ht 70.0 in | Wt 177.0 lb

## 2018-08-19 DIAGNOSIS — I10 Essential (primary) hypertension: Secondary | ICD-10-CM

## 2018-08-19 DIAGNOSIS — K8309 Other cholangitis: Secondary | ICD-10-CM | POA: Diagnosis not present

## 2018-08-19 DIAGNOSIS — Z794 Long term (current) use of insulin: Secondary | ICD-10-CM | POA: Diagnosis not present

## 2018-08-19 DIAGNOSIS — E1121 Type 2 diabetes mellitus with diabetic nephropathy: Secondary | ICD-10-CM

## 2018-08-19 DIAGNOSIS — I480 Paroxysmal atrial fibrillation: Secondary | ICD-10-CM | POA: Diagnosis not present

## 2018-08-19 LAB — URINALYSIS, ROUTINE W REFLEX MICROSCOPIC
HGB URINE DIPSTICK: NEGATIVE
Ketones, ur: NEGATIVE
Leukocytes, UA: NEGATIVE
Nitrite: NEGATIVE
RBC / HPF: NONE SEEN (ref 0–?)
Specific Gravity, Urine: 1.015 (ref 1.000–1.030)
Total Protein, Urine: NEGATIVE
Urine Glucose: NEGATIVE
Urobilinogen, UA: 0.2 (ref 0.0–1.0)
pH: 5.5 (ref 5.0–8.0)

## 2018-08-19 LAB — COMPREHENSIVE METABOLIC PANEL
ALT: 92 U/L — ABNORMAL HIGH (ref 0–53)
AST: 54 U/L — ABNORMAL HIGH (ref 0–37)
Albumin: 4.1 g/dL (ref 3.5–5.2)
Alkaline Phosphatase: 280 U/L — ABNORMAL HIGH (ref 39–117)
BUN: 37 mg/dL — ABNORMAL HIGH (ref 6–23)
CO2: 28 mEq/L (ref 19–32)
Calcium: 10.2 mg/dL (ref 8.4–10.5)
Chloride: 99 mEq/L (ref 96–112)
Creatinine, Ser: 1.79 mg/dL — ABNORMAL HIGH (ref 0.40–1.50)
GFR: 45.72 mL/min — ABNORMAL LOW (ref >60.00)
Glucose, Bld: 99 mg/dL (ref 70–99)
Potassium: 4.5 mEq/L (ref 3.5–5.1)
Sodium: 136 mEq/L (ref 135–145)
Total Bilirubin: 5.2 mg/dL — ABNORMAL HIGH (ref 0.2–1.2)
Total Protein: 8.7 g/dL — ABNORMAL HIGH (ref 6.0–8.3)

## 2018-08-19 LAB — TSH: TSH: 1.93 u[IU]/mL (ref 0.35–4.50)

## 2018-08-19 LAB — HEMOGLOBIN A1C: Hgb A1c MFr Bld: 6.1 % (ref 4.6–6.5)

## 2018-08-19 NOTE — Patient Instructions (Signed)
Type 2 Diabetes Mellitus, Diagnosis, Adult Type 2 diabetes (type 2 diabetes mellitus) is a long-term (chronic) disease. In type 2 diabetes, one or both of these problems may be present:  The pancreas does not make enough of a hormone called insulin.  Cells in the body do not respond properly to insulin that the body makes (insulin resistance). Normally, insulin allows blood sugar (glucose) to enter cells in the body. The cells use glucose for energy. Insulin resistance or lack of insulin causes excess glucose to build up in the blood instead of going into cells. As a result, high blood glucose (hyperglycemia) develops. What increases the risk? The following factors may make you more likely to develop type 2 diabetes:  Having a family member with type 2 diabetes.  Being overweight or obese.  Having an inactive (sedentary) lifestyle.  Having been diagnosed with insulin resistance.  Having a history of prediabetes, gestational diabetes, or polycystic ovary syndrome (PCOS).  Being of American-Indian, African-American, Hispanic/Latino, or Asian/Pacific Islander descent. What are the signs or symptoms? In the early stage of this condition, you may not have symptoms. Symptoms develop slowly and may include:  Increased thirst (polydipsia).  Increased hunger(polyphagia).  Increased urination (polyuria).  Increased urination during the night (nocturia).  Unexplained weight loss.  Frequent infections that keep coming back (recurring).  Fatigue.  Weakness.  Vision changes, such as blurry vision.  Cuts or bruises that are slow to heal.  Tingling or numbness in the hands or feet.  Dark patches on the skin (acanthosis nigricans). How is this diagnosed? This condition is diagnosed based on your symptoms, your medical history, a physical exam, and your blood glucose level. Your blood glucose may be checked with one or more of the following blood tests:  A fasting blood glucose (FBG)  test. You will not be allowed to eat (you will fast) for 8 hours or longer before a blood sample is taken.  A random blood glucose test. This test checks blood glucose at any time of day regardless of when you ate.  An A1c (hemoglobin A1c) blood test. This test provides information about blood glucose control over the previous 2-3 months.  An oral glucose tolerance test (OGTT). This test measures your blood glucose at two times: ? After fasting. This is your baseline blood glucose level. ? Two hours after drinking a beverage that contains glucose. You may be diagnosed with type 2 diabetes if:  Your FBG level is 126 mg/dL (7.0 mmol/L) or higher.  Your random blood glucose level is 200 mg/dL (11.1 mmol/L) or higher.  Your A1c level is 6.5% or higher.  Your OGTT result is higher than 200 mg/dL (11.1 mmol/L). These blood tests may be repeated to confirm your diagnosis. How is this treated? Your treatment may be managed by a specialist called an endocrinologist. Type 2 diabetes may be treated by following instructions from your health care provider about:  Making diet and lifestyle changes. This may include: ? Following an individualized nutrition plan that is developed by a diet and nutrition specialist (registered dietitian). ? Exercising regularly. ? Finding ways to manage stress.  Checking your blood glucose level as often as told.  Taking diabetes medicines or insulin daily. This helps to keep your blood glucose levels in the healthy range. ? If you use insulin, you may need to adjust the dosage depending on how physically active you are and what foods you eat. Your health care provider will tell you how to adjust your dosage.    Taking medicines to help prevent complications from diabetes, such as: ? Aspirin. ? Medicine to lower cholesterol. ? Medicine to control blood pressure. Your health care provider will set individualized treatment goals for you. Your goals will be based on  your age, other medical conditions you have, and how you respond to diabetes treatment. Generally, the goal of treatment is to maintain the following blood glucose levels:  Before meals (preprandial): 80-130 mg/dL (4.4-7.2 mmol/L).  After meals (postprandial): below 180 mg/dL (10 mmol/L).  A1c level: less than 7%. Follow these instructions at home: Questions to ask your health care provider  Consider asking the following questions: ? Do I need to meet with a diabetes educator? ? Where can I find a support group for people with diabetes? ? What equipment will I need to manage my diabetes at home? ? What diabetes medicines do I need, and when should I take them? ? How often do I need to check my blood glucose? ? What number can I call if I have questions? ? When is my next appointment? General instructions  Take over-the-counter and prescription medicines only as told by your health care provider.  Keep all follow-up visits as told by your health care provider. This is important.  For more information about diabetes, visit: ? American Diabetes Association (ADA): www.diabetes.org ? American Association of Diabetes Educators (AADE): www.diabeteseducator.org Contact a health care provider if:  Your blood glucose is at or above 240 mg/dL (13.3 mmol/L) for 2 days in a row.  You have been sick or have had a fever for 2 days or longer, and you are not getting better.  You have any of the following problems for more than 6 hours: ? You cannot eat or drink. ? You have nausea and vomiting. ? You have diarrhea. Get help right away if:  Your blood glucose is lower than 54 mg/dL (3.0 mmol/L).  You become confused or you have trouble thinking clearly.  You have difficulty breathing.  You have moderate or large ketone levels in your urine. Summary  Type 2 diabetes (type 2 diabetes mellitus) is a long-term (chronic) disease. In type 2 diabetes, the pancreas does not make enough of a  hormone called insulin, or cells in the body do not respond properly to insulin that the body makes (insulin resistance).  This condition is treated by making diet and lifestyle changes and taking diabetes medicines or insulin.  Your health care provider will set individualized treatment goals for you. Your goals will be based on your age, other medical conditions you have, and how you respond to diabetes treatment.  Keep all follow-up visits as told by your health care provider. This is important. This information is not intended to replace advice given to you by your health care provider. Make sure you discuss any questions you have with your health care provider. Document Released: 07/15/2005 Document Revised: 02/13/2017 Document Reviewed: 08/18/2015 Elsevier Interactive Patient Education  2019 Elsevier Inc.  

## 2018-08-19 NOTE — Progress Notes (Signed)
Subjective:  Patient ID: [name] [name], male    DOB: Nov 26, 1950  Age: 68 y.o. 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  He is currently being seen by GI and a stent has been placed.  Specimens thus far have not revealed a malignancy. He tells me that over the last few days he has improved significantly with better energy, better appetite, and improving icterus.  Outpatient Medications Prior to Visit  Medication Sig Dispense Refill  . albuterol (PROVENTIL HFA;VENTOLIN HFA) 108 (90 Base) MCG/ACT inhaler Inhale 2 puffs into the lungs every 6 (six) hours as needed for wheezing. 18 g 5  . amLODipine (NORVASC) 5 MG tablet TAKE 1 TABLET BY MOUTH ONCE DAILY (Patient taking differently: Take 5 mg by mouth daily. ) 90 tablet 1  . apixaban (ELIQUIS) 5 MG TABS tablet Take 1 tablet (5 mg total) by mouth 2 (two) times daily. 60 tablet 6  . fish oil-omega-3 fatty acids 1000 MG capsule Take 2 g by mouth at bedtime.     Marland Kitchen glucose blood (ONETOUCH VERIO) test strip USE AS INSTRUCTED THREE TIMES DAILY. DX CODE E11.21 100 each 11  . Glycopyrrolate-Formoterol (BEVESPI AEROSPHERE) 9-4.8 MCG/ACT AERO Inhale 2 Act 2 (two) times daily into the lungs. 10.7 g 11  . hydrochlorothiazide (HYDRODIURIL) 25 MG tablet TAKE 1 TABLET BY MOUTH ONCE DAILY (Patient taking differently: Take 25 mg by mouth daily. ) 90 tablet 0  . Insulin Detemir (LEVEMIR FLEXPEN) 100 UNIT/ML Pen Inject 15 units once daily. (Patient taking differently: Inject 15 Units into the skin at bedtime. ) 5 pen 1  . insulin regular (NOVOLIN R RELION) 100 units/mL injection INJECT 5-8 UNITS SUBCUTANEOUSLY THREE TIMES DAILY before meals (Patient taking differently: Inject 5-8 Units into the skin 2 (two) times daily  before a meal. ) 10 mL 3  . Insulin Syringe-Needle U-100 (EASY TOUCH INSULIN SYRINGE) 31G X 5/16" 0.3 ML MISC USE AS DIRECTED 4 TIMES DAILY 200 each 4  . levocetirizine (XYZAL) 5 MG tablet Take 1 tablet (5 mg total) by mouth every evening. 90 tablet 1  . linagliptin (TRADJENTA) 5 MG TABS tablet Take 1 tablet (5 mg total) by mouth daily. (Patient taking differently: Take 5 mg by mouth at bedtime. ) 30 tablet 11  . losartan (COZAAR) 100 MG tablet TAKE 1 TABLET BY MOUTH ONCE DAILY (Patient taking differently: Take 100 mg by mouth daily. ) 90 tablet 1  . metoprolol succinate (TOPROL-XL) 50 MG 24 hr tablet TAKE 1 TABLET BY MOUTH ONCE DAILY WITH  OR  IMMEDIATELY  FOLLOWING  A  MEAL (Patient taking differently: Take 50 mg by mouth daily. ) 30 tablet 6  . montelukast (SINGULAIR) 10 MG tablet Take 1 tablet (10 mg total) by mouth at bedtime. 90 tablet 1  . Multiple Vitamin (MULTIVITAMIN WITH MINERALS) TABS tablet Take 1 tablet by mouth daily.    Marland Kitchen omeprazole (PRILOSEC) 20 MG capsule TAKE 1 CAPSULE BY MOUTH ONCE DAILY (Patient taking differently: Take 20 mg by mouth daily. ) 30 capsule 5  . ciprofloxacin (CIPRO) 500 MG tablet Take 1 tablet (500 mg total) by mouth 2 (two) times daily. 10 tablet 0  . diazepam (VALIUM) 5 MG tablet Take 1 tablet (5 mg total)  by mouth every 6 (six) hours as needed for anxiety. 5 tablet 0  . metFORMIN (GLUCOPHAGE) 500 MG tablet Take 1 tablet (500 mg total) by mouth 2 (two) times daily with a meal. 90 tablet 0   No facility-administered medications prior to visit.     ROS Review of Systems  Constitutional: Positive for fatigue and unexpected weight change. Negative for appetite change, diaphoresis and fever.  HENT: Negative.  Negative for trouble swallowing.   Eyes: Negative for visual disturbance.  Respiratory: Negative for cough, chest tightness, shortness of breath and wheezing.   Cardiovascular: Negative for chest pain, palpitations and leg swelling.  Gastrointestinal:  Negative for abdominal pain, diarrhea and nausea.  Endocrine: Negative.   Genitourinary: Negative.  Negative for difficulty urinating and dysuria.  Musculoskeletal: Negative.  Negative for arthralgias.  Skin: Negative.  Negative for color change, pallor and rash.  Neurological: Negative.  Negative for dizziness, light-headedness and numbness.  Hematological: Negative for adenopathy. Does not bruise/bleed easily.  Psychiatric/Behavioral: Negative.     Objective:  BP 132/80 (BP Location: Left Arm, Patient Position: Sitting, Cuff Size: Normal)   Pulse 70   Temp 97.6 F (36.4 C) (Oral)   Resp 16   Ht 5\' 10"  (1.778 m)   Wt 177 lb (80.3 kg)   SpO2 98%   BMI 25.40 kg/m   BP Readings from Last 3 Encounters:  08/19/18 132/80  08/13/18 127/73  07/31/18 (!) 142/75    Wt Readings from Last 3 Encounters:  08/19/18 177 lb (80.3 kg)  08/13/18 188 lb 4.4 oz (85.4 kg)  07/27/18 194 lb 14.2 oz (88.4 kg)    Physical Exam Vitals signs reviewed.  Constitutional:      General: He is not in acute distress.    Appearance: He is ill-appearing. He is not toxic-appearing or diaphoretic.  HENT:     Nose: No congestion or rhinorrhea.     Mouth/Throat:     Mouth: Mucous membranes are moist.     Pharynx: Oropharynx is clear. No oropharyngeal exudate.  Eyes:     General: Scleral icterus present.     Conjunctiva/sclera: Conjunctivae normal.  Neck:     Musculoskeletal: Normal range of motion and neck supple.  Cardiovascular:     Rate and Rhythm: Normal rate and regular rhythm.     Heart sounds: No murmur. No friction rub. No gallop.   Pulmonary:     Effort: Pulmonary effort is normal.     Breath sounds: No stridor. No wheezing, rhonchi or rales.  Abdominal:     General: Abdomen is flat. Bowel sounds are normal.     Palpations: Abdomen is soft. There is no hepatomegaly or splenomegaly.     Tenderness: There is no abdominal tenderness. There is no guarding or rebound.     Hernia: No hernia is  present.  Musculoskeletal: Normal range of motion.        General: No swelling.     Right lower leg: No edema.  Skin:    General: Skin is warm and dry.     Findings: No erythema or rash.  Neurological:     General: No focal deficit present.     Mental Status: He is oriented to person, place, and time. Mental status is at baseline.     Lab Results  Component Value Date   WBC 6.5 07/27/2018   HGB 13.3 07/27/2018   HCT 41.8 07/27/2018   PLT 325 07/27/2018   GLUCOSE 99 08/19/2018   CHOL 178  02/23/2018   TRIG 108.0 02/23/2018   HDL 48.00 02/23/2018   LDLDIRECT 92.0 04/03/2017   LDLCALC 108 (H) 02/23/2018   ALT 92 (H) 08/19/2018   AST 54 (H) 08/19/2018   NA 136 08/19/2018   K 4.5 08/19/2018   CL 99 08/19/2018   CREATININE 1.79 (H) 08/19/2018   BUN 37 (H) 08/19/2018   CO2 28 08/19/2018   TSH 1.93 08/19/2018   PSA 1.32 09/16/2016   INR 1.02 07/29/2018   HGBA1C 6.1 08/19/2018   MICROALBUR 3.3 (H) 02/23/2018    Dg Ercp Biliary & Pancreatic Ducts  Result Date: 08/13/2018 CLINICAL DATA:  Your CP and stent EXAM: ERCP TECHNIQUE: Multiple spot images obtained with the fluoroscopic device and submitted for interpretation post-procedure. FLUOROSCOPY TIME:  Fluoroscopy Time:  3 minutes and 7 seconds Radiation Exposure Index (if provided by the fluoroscopic device): 69.4 mGy Number of Acquired Spot Images: 0 COMPARISON:  None. FINDINGS: Imaging demonstrates cannulation of the common bile duct. Narrowing of the mid common bile duct is noted. The patient has a known pancreatic head mass. Subsequent images demonstrate placement of a metallic stent extending from the common hepatic duct, across the ampulla and in the duodenum. IMPRESSION: Successful biliary stent placement. These images were submitted for radiologic interpretation only. Please see the procedural report for the amount of contrast and the fluoroscopy time utilized. Electronically Signed   By: Marybelle Killings M.D.   On: 08/13/2018 13:32     Assessment & Plan:   Korbyn was seen today for atrial fibrillation, hypertension and diabetes.  Diagnoses and all orders for this visit:  Paroxysmal atrial fibrillation ([place])- He is maintaining sinus rhythm.  Will continue anticoagulation with Eliquis. -     TSH; Future  Type 2 diabetes mellitus with diabetic nephropathy, with long-term current use of insulin ([place])- His A1c is down to 6.1% and he has had an acute kidney injury.  I recommended that he stop taking metformin. -     Comprehensive metabolic panel; Future -     Hemoglobin A1c; Future -     HM Diabetes Foot Exam  Essential hypertension, benign- His blood pressure is adequately well controlled.  His electrolytes are normal.  His renal function is improving. -     Comprehensive metabolic panel; Future -     TSH; Future -     Urinalysis, Routine w reflex microscopic; Future  Cholangitis- Based on his symptoms, exam, and labs this is improving.  He will continue with the work-up as directed by GI. -     Comprehensive metabolic panel; Future   I have discontinued Patrice Mestas's metFORMIN, diazepam, and ciprofloxacin. I am also having him maintain his fish oil-omega-3 fatty acids, Insulin Syringe-Needle U-100, Insulin Detemir, linagliptin, insulin regular, Glycopyrrolate-Formoterol, albuterol, glucose blood, levocetirizine, montelukast, omeprazole, apixaban, hydrochlorothiazide, losartan, amLODipine, metoprolol succinate, and multivitamin with minerals.  No orders of the defined types were placed in this encounter.    Follow-up: Return in about 3 months (around 11/18/2018).  Scarlette Calico, MD

## 2018-08-20 ENCOUNTER — Other Ambulatory Visit: Payer: Self-pay

## 2018-08-20 DIAGNOSIS — K8689 Other specified diseases of pancreas: Secondary | ICD-10-CM

## 2018-08-25 ENCOUNTER — Telehealth: Payer: Self-pay | Admitting: Internal Medicine

## 2018-08-25 NOTE — Telephone Encounter (Signed)
Please CB [phone]

## 2018-08-25 NOTE — Telephone Encounter (Signed)
Pt stated he just had a stent put in his pancreas.  He is experiencing severe abdominal discomfort and requested a CB on the number below.

## 2018-08-26 NOTE — Telephone Encounter (Signed)
Left message for patient to call back  

## 2018-08-27 NOTE — Telephone Encounter (Signed)
I spoke with the patient and all of his pain has resolved.  He has figured out that if he eats on a regular basis his pain is controlled.

## 2018-09-01 ENCOUNTER — Other Ambulatory Visit (INDEPENDENT_AMBULATORY_CARE_PROVIDER_SITE_OTHER): Payer: Medicare Other

## 2018-09-01 ENCOUNTER — Ambulatory Visit (INDEPENDENT_AMBULATORY_CARE_PROVIDER_SITE_OTHER): Payer: Medicare Other | Admitting: Internal Medicine

## 2018-09-01 ENCOUNTER — Encounter: Payer: Self-pay | Admitting: Internal Medicine

## 2018-09-01 DIAGNOSIS — K8689 Other specified diseases of pancreas: Secondary | ICD-10-CM

## 2018-09-01 DIAGNOSIS — K831 Obstruction of bile duct: Secondary | ICD-10-CM | POA: Diagnosis not present

## 2018-09-01 LAB — COMPREHENSIVE METABOLIC PANEL WITH GFR
ALT: 17 U/L (ref 0–53)
AST: 17 U/L (ref 0–37)
Albumin: 3.8 g/dL (ref 3.5–5.2)
Alkaline Phosphatase: 148 U/L — ABNORMAL HIGH (ref 39–117)
BUN: 23 mg/dL (ref 6–23)
CO2: 28 meq/L (ref 19–32)
Calcium: 9.4 mg/dL (ref 8.4–10.5)
Chloride: 99 meq/L (ref 96–112)
Creatinine, Ser: 1.5 mg/dL (ref 0.40–1.50)
GFR: 56.06 mL/min — ABNORMAL LOW (ref >60.00)
Glucose, Bld: 144 mg/dL — ABNORMAL HIGH (ref 70–99)
Potassium: 3.4 meq/L — ABNORMAL LOW (ref 3.5–5.1)
Sodium: 138 meq/L (ref 135–145)
Total Bilirubin: 2.7 mg/dL — ABNORMAL HIGH (ref 0.2–1.2)
Total Protein: 7.2 g/dL (ref 6.0–8.3)

## 2018-09-01 MED ORDER — HYDROCODONE-ACETAMINOPHEN 5-325 MG PO TABS: 1.0000 | ORAL_TABLET | Freq: Four times a day (QID) | ORAL | 0 refills | Status: DC | PRN

## 2018-09-01 NOTE — Assessment & Plan Note (Signed)
Improved after stenting Recheck CMET

## 2018-09-01 NOTE — Patient Instructions (Addendum)
Your provider has requested that you go to the basement level for lab work before leaving today. Press "B" on the elevator. The lab is located at the first door on the left as you exit the elevator.   Hold your Tradjenta per Dr Carlean Purl until further notice.   We have sent the following medications to your pharmacy for you to pick up at your convenience: Vicodin   I appreciate the opportunity to care for you. Silvano Rusk, MD, [facility]

## 2018-09-01 NOTE — Assessment & Plan Note (Addendum)
Awaiting reimaging with CT when creatinine NL so far benign ? From chronic pancreatitis Hold Tradjenta - is implicated as a cause of pancreatitis - not taking regularly anyway Treat epigastic pain w/ Vicodin 5 mg # 10 so can sleep  Further care through Dr. Ardis Hughs

## 2018-09-01 NOTE — Progress Notes (Signed)
[name] 68 y.o. [birth year] [id number]  Assessment & Plan:  Pancreatic mass Awaiting reimaging with CT when creatinine NL so far benign ? From chronic pancreatitis Hold Tradjenta - is implicated as a cause of pancreatitis - not taking regularly anyway Treat epigastic pain w/ Vicodin 5 mg # 10 so can sleep  Further care through Dr. Ardis Hughs  Biliary obstruction Improved after stenting Recheck CMET  I appreciate the opportunity to care for this patient. CC: [name] Lima, MD Dr. Oretha Caprice  Subjective:   Chief Complaint: Pancreatic mass, follow-up  HPI 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  He was initially seen by Dr. Fuller Plan who performed ERCP with brushings and a plastic stent was placed on January 2.  Brushings were atypical.  This appointment was made at that time, I have never seen the patient but he canceled an appointment with me so he was assigned to me.  He then presented for EUS and Dr. Ardis Hughs noted him to be very jaundiced on January 16 so an ERCP showed that the stent had migrated so he replaced it with a covered biliary stent took more brushings and also did an FNA of the pancreatic mass which came back negative for malignancy but with atypical cells.  So he does not have a clear diagnosis of cancer though that is suspected.  His creatinine has been elevated and Dr. Ardis Hughs anticipates repeating a CT scan when the creatinine improves where he will tolerate a CT scan with contrast.  His urine is lighter his stools are normal color though he is a bit constipated.  He is having some problems at night he cannot sleep due to epigastric pain but overall he feels better than he did he would like something to help with the pain so he could sleep because he is having fatigue. Allergies  Allergen Reactions  . Hydralazine Shortness Of Breath    Heart palpitations  .  Lisinopril Cough  . Penicillins Other (See Comments)    [place] DID THE REACTION INVOLVE: Swelling of the face/tongue/throat, SOB, or low BP? No Yes Sudden or severe rash/hives, skin peeling, or the inside of the mouth or nose? No Did it require medical treatment? No When did it last happen?teens  If all above answers are "NO", may proceed with cephalosporin use.  Marland Kitchen Tribenzor [Olmesartan-Amlodipine-Hctz] Nausea Only    dizziness   Current Meds  Medication Sig  . albuterol (PROVENTIL HFA;VENTOLIN HFA) 108 (90 Base) MCG/ACT inhaler Inhale 2 puffs into the lungs every 6 (six) hours as needed for wheezing.  Marland Kitchen amLODipine (NORVASC) 5 MG tablet TAKE 1 TABLET BY MOUTH ONCE DAILY (Patient taking differently: Take 5 mg by mouth daily. )  . apixaban (ELIQUIS) 5 MG TABS tablet Take 1 tablet (5 mg total) by mouth 2 (two) times daily.  . fish oil-omega-3 fatty acids 1000 MG capsule Take 2 g by mouth at bedtime.   Marland Kitchen glucose blood (ONETOUCH VERIO) test strip USE AS INSTRUCTED THREE TIMES DAILY. DX CODE E11.21  . Glycopyrrolate-Formoterol (BEVESPI AEROSPHERE) 9-4.8 MCG/ACT AERO Inhale 2 Act 2 (two) times daily into the lungs.  . hydrochlorothiazide (HYDRODIURIL) 25 MG tablet TAKE 1 TABLET BY MOUTH ONCE DAILY (Patient taking differently: Take 25 mg by mouth daily. )  . Insulin Detemir (LEVEMIR FLEXPEN) 100 UNIT/ML Pen Inject 15 units once daily. (Patient taking differently: Inject 15 Units into the skin at bedtime. )  .  insulin regular (NOVOLIN R RELION) 100 units/mL injection INJECT 5-8 UNITS SUBCUTANEOUSLY THREE TIMES DAILY before meals (Patient taking differently: Inject 5-8 Units into the skin 2 (two) times daily before a meal. )  . Insulin Syringe-Needle U-100 (EASY TOUCH INSULIN SYRINGE) 31G X 5/16" 0.3 ML MISC USE AS DIRECTED 4 TIMES DAILY  . levocetirizine (XYZAL) 5 MG tablet Take 1 tablet (5 mg total) by mouth every evening.  . linagliptin (TRADJENTA) 5 MG TABS tablet Take 1 tablet (5 mg total)  by mouth daily. (Patient taking differently: Take 5 mg by mouth at bedtime. )  . losartan (COZAAR) 100 MG tablet TAKE 1 TABLET BY MOUTH ONCE DAILY (Patient taking differently: Take 100 mg by mouth daily. )  . metoprolol succinate (TOPROL-XL) 50 MG 24 hr tablet TAKE 1 TABLET BY MOUTH ONCE DAILY WITH  OR  IMMEDIATELY  FOLLOWING  A  MEAL (Patient taking differently: Take 50 mg by mouth daily. )  . montelukast (SINGULAIR) 10 MG tablet Take 1 tablet (10 mg total) by mouth at bedtime.  . Multiple Vitamin (MULTIVITAMIN WITH MINERALS) TABS tablet Take 1 tablet by mouth daily.  Marland Kitchen omeprazole (PRILOSEC) 20 MG capsule TAKE 1 CAPSULE BY MOUTH ONCE DAILY (Patient taking differently: Take 20 mg by mouth daily. )   Past Medical History:  Diagnosis Date  . Allergy   . Asthma   . Atrial fibrillation ([place])    a. initially occuring in 2014, has declined anticoagulation since having a GI bleed with initiation of Xarelto  . Cardiomyopathy 2011   a. Patient was told that he had an ejection fraction of 37% by echo done elsewhere in 2011 b. 06/2014: echo showing preserved EF of 55-60%, severe asymetric septal hypertrophy with no SAM or LVOT gradient  . COPD (chronic obstructive pulmonary disease) ([place])   . Diabetes mellitus without complication ([place])   . Diverticulitis   . GERD (gastroesophageal reflux disease)   . History of colon polyps   . Hypertension   . Tobacco abuse    Past Surgical History:  Procedure Laterality Date  . BILIARY STENT PLACEMENT  07/30/2018   Procedure: BILIARY STENT PLACEMENT;  Surgeon: Ladene Artist, MD;  Location: [facility] ENDOSCOPY;  Service: Endoscopy;;  . BILIARY STENT PLACEMENT N/A 08/13/2018   Procedure: BILIARY STENT PLACEMENT;  Surgeon: Milus Banister, MD;  Location: [facility] ENDOSCOPY;  Service: Endoscopy;  Laterality: N/A;  . COLON SURGERY  2009  . COLONOSCOPY    . ERCP    . ERCP N/A 07/30/2018   Procedure: ENDOSCOPIC RETROGRADE CHOLANGIOPANCREATOGRAPHY (ERCP);  Surgeon: Ladene Artist, MD;  Location: [facility] ENDOSCOPY;  Service: Endoscopy;  Laterality: N/A;  . ERCP N/A 08/13/2018   Procedure: ENDOSCOPIC RETROGRADE CHOLANGIOPANCREATOGRAPHY (ERCP);  Surgeon: Milus Banister, MD;  Location: [facility] ENDOSCOPY;  Service: Endoscopy;  Laterality: N/A;  . ESOPHAGOGASTRODUODENOSCOPY N/A 08/13/2018   Procedure: ESOPHAGOGASTRODUODENOSCOPY (EGD);  Surgeon: Milus Banister, MD;  Location: [facility] ENDOSCOPY;  Service: Endoscopy;  Laterality: N/A;  . EUS N/A 08/13/2018   Procedure: UPPER ENDOSCOPIC ULTRASOUND (EUS) RADIAL;  Surgeon: Milus Banister, MD;  Location: [facility] ENDOSCOPY;  Service: Endoscopy;  Laterality: N/A;  . FINE NEEDLE ASPIRATION N/A 08/13/2018   Procedure: FINE NEEDLE ASPIRATION (FNA) LINEAR;  Surgeon: Milus Banister, MD;  Location: [facility] ENDOSCOPY;  Service: Endoscopy;  Laterality: N/A;  . 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   Review of Systems See HPI  Objective:   Physical Exam BP 90/70 (BP Location: Left Arm, Patient Position: Sitting, Cuff Size: Normal)   Pulse 76   Ht 5' 7.25" (1.708 m) Comment: height measured without shoes  Wt 173 lb 4 oz (78.6 kg)   BMI 26.93 kg/m  NAD ? Mild icterus abd soft mildly tender in epigastrium, BS + and small umbilical hernia

## 2018-09-02 ENCOUNTER — Other Ambulatory Visit: Payer: Self-pay

## 2018-09-02 DIAGNOSIS — K831 Obstruction of bile duct: Secondary | ICD-10-CM

## 2018-09-02 DIAGNOSIS — K8689 Other specified diseases of pancreas: Secondary | ICD-10-CM

## 2018-09-07 ENCOUNTER — Other Ambulatory Visit (INDEPENDENT_AMBULATORY_CARE_PROVIDER_SITE_OTHER): Payer: Medicare Other

## 2018-09-07 DIAGNOSIS — K831 Obstruction of bile duct: Secondary | ICD-10-CM | POA: Diagnosis not present

## 2018-09-07 DIAGNOSIS — K8689 Other specified diseases of pancreas: Secondary | ICD-10-CM | POA: Diagnosis not present

## 2018-09-07 LAB — BASIC METABOLIC PANEL
BUN: 19 mg/dL (ref 6–23)
CALCIUM: 9.1 mg/dL (ref 8.4–10.5)
CO2: 33 meq/L — AB (ref 19–32)
Chloride: 95 mEq/L — ABNORMAL LOW (ref 96–112)
Creatinine, Ser: 1.38 mg/dL (ref 0.40–1.50)
GFR: 61.71 mL/min (ref >60.00)
Glucose, Bld: 171 mg/dL — ABNORMAL HIGH (ref 70–99)
Potassium: 3.5 mEq/L (ref 3.5–5.1)
Sodium: 134 mEq/L — ABNORMAL LOW (ref 135–145)

## 2018-09-08 ENCOUNTER — Other Ambulatory Visit: Payer: Self-pay

## 2018-09-08 DIAGNOSIS — K8689 Other specified diseases of pancreas: Secondary | ICD-10-CM

## 2018-09-09 ENCOUNTER — Telehealth: Payer: Self-pay | Admitting: Internal Medicine

## 2018-09-09 NOTE — Telephone Encounter (Signed)
Detailed voice mail left for the pt to pick up contrast at [facility] radiology today .

## 2018-09-09 NOTE — Telephone Encounter (Signed)
Pt is scheduled for a CT scan tomorrow, he wants to know where he can p/u contrast. I looked at the front desk but could not find one for him. Pls call pt.

## 2018-09-10 ENCOUNTER — Telehealth: Payer: Self-pay | Admitting: Gastroenterology

## 2018-09-10 ENCOUNTER — Other Ambulatory Visit: Payer: Self-pay

## 2018-09-10 ENCOUNTER — Ambulatory Visit (HOSPITAL_COMMUNITY)
Admission: RE | Admit: 2018-09-10 | Discharge: 2018-09-10 | Disposition: A | Discharge status: Home / Self Care | Payer: Medicare Other | Source: Ambulatory Visit | Attending: Gastroenterology | Admitting: Gastroenterology

## 2018-09-10 DIAGNOSIS — K8689 Other specified diseases of pancreas: Secondary | ICD-10-CM

## 2018-09-10 DIAGNOSIS — K869 Disease of pancreas, unspecified: Secondary | ICD-10-CM | POA: Diagnosis not present

## 2018-09-10 DIAGNOSIS — K449 Diaphragmatic hernia without obstruction or gangrene: Secondary | ICD-10-CM | POA: Diagnosis not present

## 2018-09-10 DIAGNOSIS — K831 Obstruction of bile duct: Secondary | ICD-10-CM

## 2018-09-10 MED ORDER — SODIUM CHLORIDE (PF) 0.9 % IJ SOLN
INTRAMUSCULAR | Status: AC
  Filled 2018-09-10: qty 50

## 2018-09-10 MED ORDER — SODIUM CHLORIDE 0.9 % IV BOLUS
500.0000 mL | Freq: Once | INTRAVENOUS | Status: AC
  Administered 2018-09-10: 500 mL via INTRAVENOUS

## 2018-09-10 MED ORDER — IOHEXOL 300 MG/ML  SOLN
100.0000 mL | Freq: Once | INTRAMUSCULAR | Status: AC | PRN
  Administered 2018-09-10: 100 mL via INTRAVENOUS

## 2018-09-10 MED ORDER — SODIUM CHLORIDE 0.9 % IV SOLN
INTRAVENOUS | Status: AC
  Filled 2018-09-10: qty 1000

## 2018-09-10 NOTE — Telephone Encounter (Signed)
[name] [name] I put the order in for the saline but I don't have a number to call Tammy back.

## 2018-09-10 NOTE — Telephone Encounter (Signed)
Please return [name] [name]'s call [phone].

## 2018-09-10 NOTE — Telephone Encounter (Signed)
Order faxed to [phone] confirmed receipt  with [facility]

## 2018-09-11 ENCOUNTER — Encounter (HOSPITAL_COMMUNITY): Payer: Self-pay | Admitting: Internal Medicine

## 2018-09-11 ENCOUNTER — Emergency Department (HOSPITAL_COMMUNITY)
Admission: EM | Admit: 2018-09-11 | Discharge: 2018-09-11 | Disposition: A | Discharge status: Home / Self Care | Payer: Medicare Other | Attending: Emergency Medicine | Admitting: Emergency Medicine

## 2018-09-11 ENCOUNTER — Emergency Department (HOSPITAL_COMMUNITY): Payer: Medicare Other

## 2018-09-11 DIAGNOSIS — I480 Paroxysmal atrial fibrillation: Secondary | ICD-10-CM | POA: Diagnosis not present

## 2018-09-11 DIAGNOSIS — I4510 Unspecified right bundle-branch block: Secondary | ICD-10-CM | POA: Diagnosis not present

## 2018-09-11 DIAGNOSIS — Z794 Long term (current) use of insulin: Secondary | ICD-10-CM | POA: Insufficient documentation

## 2018-09-11 DIAGNOSIS — Z7901 Long term (current) use of anticoagulants: Secondary | ICD-10-CM | POA: Diagnosis not present

## 2018-09-11 DIAGNOSIS — Z8673 Personal history of transient ischemic attack (TIA), and cerebral infarction without residual deficits: Secondary | ICD-10-CM | POA: Insufficient documentation

## 2018-09-11 DIAGNOSIS — J45909 Unspecified asthma, uncomplicated: Secondary | ICD-10-CM | POA: Diagnosis not present

## 2018-09-11 DIAGNOSIS — R0902 Hypoxemia: Secondary | ICD-10-CM | POA: Diagnosis not present

## 2018-09-11 DIAGNOSIS — Z880 Allergy status to penicillin: Secondary | ICD-10-CM | POA: Insufficient documentation

## 2018-09-11 DIAGNOSIS — J449 Chronic obstructive pulmonary disease, unspecified: Secondary | ICD-10-CM | POA: Diagnosis not present

## 2018-09-11 DIAGNOSIS — R001 Bradycardia, unspecified: Secondary | ICD-10-CM | POA: Diagnosis not present

## 2018-09-11 DIAGNOSIS — Z79899 Other long term (current) drug therapy: Secondary | ICD-10-CM | POA: Insufficient documentation

## 2018-09-11 DIAGNOSIS — E1140 Type 2 diabetes mellitus with diabetic neuropathy, unspecified: Secondary | ICD-10-CM | POA: Diagnosis not present

## 2018-09-11 DIAGNOSIS — I10 Essential (primary) hypertension: Secondary | ICD-10-CM | POA: Diagnosis not present

## 2018-09-11 DIAGNOSIS — Z87891 Personal history of nicotine dependence: Secondary | ICD-10-CM | POA: Insufficient documentation

## 2018-09-11 DIAGNOSIS — R000 Tachycardia, unspecified: Secondary | ICD-10-CM | POA: Diagnosis not present

## 2018-09-11 DIAGNOSIS — R05 Cough: Secondary | ICD-10-CM | POA: Diagnosis not present

## 2018-09-11 DIAGNOSIS — R531 Weakness: Secondary | ICD-10-CM | POA: Diagnosis not present

## 2018-09-11 LAB — CBC WITH DIFFERENTIAL/PLATELET
Abs Immature Granulocytes: 0.02 10*3/uL (ref 0.00–0.07)
BASOS PCT: 1 %
Basophils Absolute: 0.1 10*3/uL (ref 0.0–0.1)
Eosinophils Absolute: 0.3 10*3/uL (ref 0.0–0.5)
Eosinophils Relative: 4 %
HCT: 39.4 % (ref 39.0–52.0)
Hemoglobin: 12.9 g/dL — ABNORMAL LOW (ref 13.0–17.0)
Immature Granulocytes: 0 %
Lymphocytes Relative: 25 %
Lymphs Abs: 1.9 10*3/uL (ref 0.7–4.0)
MCH: 30.8 pg (ref 26.0–34.0)
MCHC: 32.7 g/dL (ref 30.0–36.0)
MCV: 94 fL (ref 80.0–100.0)
Monocytes Absolute: 0.6 10*3/uL (ref 0.1–1.0)
Monocytes Relative: 8 %
NEUTROS PCT: 62 %
Neutro Abs: 4.7 10*3/uL (ref 1.7–7.7)
Platelets: 377 10*3/uL (ref 150–400)
RBC: 4.19 MIL/uL — AB (ref 4.22–5.81)
RDW: 12.6 % (ref 11.5–15.5)
WBC: 7.6 10*3/uL (ref 4.0–10.5)
nRBC: 0 % (ref 0.0–0.2)

## 2018-09-11 LAB — URINALYSIS, ROUTINE W REFLEX MICROSCOPIC
Bilirubin Urine: NEGATIVE
GLUCOSE, UA: NEGATIVE mg/dL
Hgb urine dipstick: NEGATIVE
Ketones, ur: NEGATIVE mg/dL
Leukocytes,Ua: NEGATIVE
Nitrite: NEGATIVE
PH: 7 (ref 5.0–8.0)
Protein, ur: NEGATIVE mg/dL
Specific Gravity, Urine: 1.016 (ref 1.005–1.030)

## 2018-09-11 LAB — COMPREHENSIVE METABOLIC PANEL
ALT: 15 U/L (ref 0–44)
AST: 26 U/L (ref 15–41)
Albumin: 3.1 g/dL — ABNORMAL LOW (ref 3.5–5.0)
Alkaline Phosphatase: 94 U/L (ref 38–126)
Anion gap: 14 (ref 5–15)
BUN: 12 mg/dL (ref 8–23)
CO2: 23 mmol/L (ref 22–32)
Calcium: 8.7 mg/dL — ABNORMAL LOW (ref 8.9–10.3)
Chloride: 99 mmol/L (ref 98–111)
Creatinine, Ser: 1.52 mg/dL — ABNORMAL HIGH (ref 0.61–1.24)
GFR calc Af Amer: 53 mL/min — ABNORMAL LOW (ref >60)
GFR calc non Af Amer: 46 mL/min — ABNORMAL LOW (ref >60)
Glucose, Bld: 137 mg/dL — ABNORMAL HIGH (ref 70–99)
Potassium: 3.2 mmol/L — ABNORMAL LOW (ref 3.5–5.1)
Sodium: 136 mmol/L (ref 135–145)
TOTAL PROTEIN: 7.1 g/dL (ref 6.5–8.1)
Total Bilirubin: 1.9 mg/dL — ABNORMAL HIGH (ref 0.3–1.2)

## 2018-09-11 LAB — PROTIME-INR
INR: 1.16
Prothrombin Time: 14.7 seconds (ref 11.4–15.2)

## 2018-09-11 LAB — PHOSPHORUS: Phosphorus: 2.6 mg/dL (ref 2.5–4.6)

## 2018-09-11 LAB — MAGNESIUM: Magnesium: 1.6 mg/dL — ABNORMAL LOW (ref 1.7–2.4)

## 2018-09-11 MED ORDER — LACTATED RINGERS IV BOLUS
1000.0000 mL | Freq: Once | INTRAVENOUS | Status: AC
  Administered 2018-09-11: 1000 mL via INTRAVENOUS

## 2018-09-11 MED ORDER — METOPROLOL SUCCINATE ER 50 MG PO TB24: 50.0000 mg | ORAL_TABLET | Freq: Every day | ORAL | 0 refills | Status: DC

## 2018-09-11 MED ORDER — DILTIAZEM HCL 25 MG/5ML IV SOLN
20.0000 mg | Freq: Once | INTRAVENOUS | Status: AC
  Administered 2018-09-11: 20 mg via INTRAVENOUS
  Filled 2018-09-11: qty 5

## 2018-09-11 MED ORDER — ONDANSETRON HCL 4 MG/2ML IJ SOLN
4.0000 mg | Freq: Once | INTRAMUSCULAR | Status: AC
  Administered 2018-09-11: 4 mg via INTRAVENOUS
  Filled 2018-09-11: qty 2

## 2018-09-11 MED ORDER — METOPROLOL SUCCINATE ER 25 MG PO TB24
50.0000 mg | ORAL_TABLET | Freq: Once | ORAL | Status: AC
  Administered 2018-09-11: 50 mg via ORAL
  Filled 2018-09-11: qty 2

## 2018-09-11 NOTE — ED Provider Notes (Signed)
[facility] EMERGENCY DEPARTMENT Provider Note   CSN: [id number] Arrival date & time: 09/11/18  1505     History   Chief Complaint Chief Complaint  Patient presents with  . Atrial Fibrillation    HPI [name] [name] is a 68 y.o. male.   Atrial Fibrillation  This is a recurrent problem. The current episode started 12 to 24 hours ago. The problem has been gradually improving. Pertinent negatives include no chest pain, no abdominal pain, no headaches and no shortness of breath. Nothing aggravates the symptoms. Nothing relieves the symptoms. He has tried nothing for the symptoms. The treatment provided no relief.  Weakness  Severity:  Moderate Onset quality:  Gradual Duration:  1 day Timing:  Constant Progression:  Unchanged Chronicity:  Recurrent Context: not alcohol use, not dehydration and not stress   Context comment:  Recent pancreatic stenting Relieved by:  None tried Worsened by:  Nothing Ineffective treatments:  None tried Associated symptoms: no abdominal pain, no arthralgias, no chest pain, no cough, no difficulty walking, no dysuria, no numbness in extremities, no fever, no headaches, no seizures, no shortness of breath, no vision change and no vomiting   Risk factors: no anemia     Past Medical History:  Diagnosis Date  . Allergy   . Asthma   . Atrial fibrillation ([place])    a. initially occuring in 2014, has declined anticoagulation since having a GI bleed with initiation of Xarelto  . Cardiomyopathy 2011   a. Patient was told that he had an ejection fraction of 37% by echo done elsewhere in 2011 b. 06/2014: echo showing preserved EF of 55-60%, severe asymetric septal hypertrophy with no SAM or LVOT gradient  . COPD (chronic obstructive pulmonary disease) ([place])   . Diabetes mellitus without complication ([place])   . Diverticulitis   . GERD (gastroesophageal reflux disease)   . History of colon polyps   . Hypertension   . Tobacco abuse      Patient Active Problem List   Diagnosis Date Noted  . Biliary obstruction   . Pancreatic mass   . Jaundice   . Abnormal finding of biliary tract   . Atrial flutter by electrocardiogram ([place]) 03/16/2018  . TIA (transient ischemic attack) 02/23/2018  . Overweight (BMI 25.0-29.9) 03/11/2016  . Allergic rhinitis due to pollen 09/04/2015  . Type 2 diabetes mellitus with diabetic nephropathy, with long-term current use of insulin ([place]) 08/24/2015  . History of colonic polyps 12/29/2014  . 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 Tobacco abuse 06/10/2014  . BPH associated with nocturia 03/15/2014  . Routine general medical examination at a health care facility 11/05/2012  . Atrial fibrillation ([place]) 10/19/2012  . Hyperlipidemia with target LDL less than 100 05/05/2012  . GERD (gastroesophageal reflux disease)   . COPD (chronic obstructive pulmonary disease) ([place])   . Cardiomyopathy ([place])   . Essential hypertension, benign 06/26/2011    Past Surgical History:  Procedure Laterality Date  . BILIARY STENT PLACEMENT  07/30/2018   Procedure: BILIARY STENT PLACEMENT;  Surgeon: Ladene Artist, MD;  Location: [facility] ENDOSCOPY;  Service: Endoscopy;;  . BILIARY STENT PLACEMENT N/A 08/13/2018   Procedure: BILIARY STENT PLACEMENT;  Surgeon: Milus Banister, MD;  Location: [facility] ENDOSCOPY;  Service: Endoscopy;  Laterality: N/A;  . COLON SURGERY  2009  . COLONOSCOPY    . ERCP    . ERCP N/A 07/30/2018   Procedure: ENDOSCOPIC RETROGRADE CHOLANGIOPANCREATOGRAPHY (ERCP);  Surgeon: Ladene Artist, MD;  Location: [facility] ENDOSCOPY;  Service: Endoscopy;  Laterality: N/A;  . ERCP N/A 08/13/2018   Procedure: ENDOSCOPIC RETROGRADE CHOLANGIOPANCREATOGRAPHY (ERCP);  Surgeon: Milus Banister, MD;  Location: [facility] ENDOSCOPY;  Service: Endoscopy;  Laterality: N/A;  . ESOPHAGOGASTRODUODENOSCOPY N/A 08/13/2018   Procedure: ESOPHAGOGASTRODUODENOSCOPY (EGD);  Surgeon: Milus Banister, MD;  Location: [facility] ENDOSCOPY;  Service: Endoscopy;   Laterality: N/A;  . EUS N/A 08/13/2018   Procedure: UPPER ENDOSCOPIC ULTRASOUND (EUS) RADIAL;  Surgeon: Milus Banister, MD;  Location: [facility] ENDOSCOPY;  Service: Endoscopy;  Laterality: N/A;  . FINE NEEDLE ASPIRATION N/A 08/13/2018   Procedure: FINE NEEDLE ASPIRATION (FNA) LINEAR;  Surgeon: Milus Banister, MD;  Location: [facility] ENDOSCOPY;  Service: Endoscopy;  Laterality: N/A;  . STENT REMOVAL  08/13/2018   Procedure: STENT REMOVAL;  Surgeon: Milus Banister, MD;  Location: [facility] ENDOSCOPY;  Service: Endoscopy;;        Home Medications    Prior to Admission medications   Medication Sig Start Date End Date Taking? Authorizing Provider  acetaminophen (TYLENOL) 500 MG tablet Take 1,000 mg by mouth every 6 (six) hours as needed (pain).   Yes [provider]  albuterol (PROVENTIL HFA;VENTOLIN HFA) 108 (90 Base) MCG/ACT inhaler Inhale 2 puffs into the lungs every 6 (six) hours as needed for wheezing. 09/23/17  Yes Janith Lima, MD  amLODipine (NORVASC) 5 MG tablet TAKE 1 TABLET BY MOUTH ONCE DAILY Patient taking differently: Take 5 mg by mouth daily.  07/15/18  Yes Strader, [place] M, PA-C  apixaban (ELIQUIS) 5 MG TABS tablet Take 1 tablet (5 mg total) by mouth 2 (two) times daily. 06/19/18  Yes Lelon Perla, MD  fish oil-omega-3 fatty acids 1000 MG capsule Take 2 g by mouth at bedtime.    Yes [provider]  hydrochlorothiazide (HYDRODIURIL) 25 MG tablet TAKE 1 TABLET BY MOUTH ONCE DAILY Patient taking differently: Take 25 mg by mouth daily.  06/26/18  Yes Janith Lima, MD  Insulin Detemir (LEVEMIR FLEXPEN) 100 UNIT/ML Pen Inject 15 units once daily. Patient taking differently: Inject 15 Units into the skin at bedtime.  07/31/16  Yes Philemon Kingdom, MD  insulin regular (NOVOLIN R RELION) 100 units/mL injection INJECT 5-8 UNITS SUBCUTANEOUSLY THREE TIMES DAILY before meals Patient taking differently: Inject 5-8 Units into the skin 3 (three) times daily as needed for high  blood sugar (CBG >120).  04/16/17  Yes Philemon Kingdom, MD  losartan (COZAAR) 100 MG tablet TAKE 1 TABLET BY MOUTH ONCE DAILY Patient taking differently: Take 100 mg by mouth daily.  06/30/18  Yes Strader, [place] M, PA-C  metoprolol succinate (TOPROL-XL) 50 MG 24 hr tablet TAKE 1 TABLET BY MOUTH ONCE DAILY WITH  OR  IMMEDIATELY  FOLLOWING  A  MEAL Patient taking differently: Take 50 mg by mouth daily.  07/20/18  Yes Lelon Perla, MD  Multiple Vitamin (MULTIVITAMIN WITH MINERALS) TABS tablet Take 1 tablet by mouth daily.   Yes [provider]  omeprazole (PRILOSEC) 20 MG capsule TAKE 1 CAPSULE BY MOUTH ONCE DAILY Patient taking differently: Take 20 mg by mouth daily.  03/16/18  Yes Janith Lima, MD  glucose blood (ONETOUCH VERIO) test strip USE AS INSTRUCTED THREE TIMES DAILY. DX CODE E11.21 01/12/18   Philemon Kingdom, MD  Glycopyrrolate-Formoterol (BEVESPI AEROSPHERE) 9-4.8 MCG/ACT AERO Inhale 2 Act 2 (two) times daily into the lungs. Patient not taking: Reported on 09/11/2018 06/12/17   Janith Lima, MD  HYDROcodone-acetaminophen (NORCO/VICODIN) 5-325 MG tablet Take 1 tablet by mouth every  6 (six) hours as needed for moderate pain. Patient not taking: Reported on 09/11/2018 09/01/18   Gatha Mayer, MD  Insulin Syringe-Needle U-100 (EASY TOUCH INSULIN SYRINGE) 31G X 5/16" 0.3 ML MISC USE AS DIRECTED 4 TIMES DAILY 05/01/16   Philemon Kingdom, MD  levocetirizine (XYZAL) 5 MG tablet Take 1 tablet (5 mg total) by mouth every evening. Patient not taking: Reported on 09/11/2018 02/23/18   Janith Lima, MD  linagliptin (TRADJENTA) 5 MG TABS tablet Take 1 tablet (5 mg total) by mouth daily. Patient not taking: Reported on 09/11/2018 09/11/16   Philemon Kingdom, MD  metoprolol succinate (TOPROL-XL) 50 MG 24 hr tablet Take 1 tablet (50 mg total) by mouth daily for 30 days. 09/11/18 10/11/18  Orson Aloe, MD  montelukast (SINGULAIR) 10 MG tablet Take 1 tablet (10 mg total) by mouth at  bedtime. Patient not taking: Reported on 09/11/2018 02/23/18   Janith Lima, MD    Family History Family History  Problem Relation Age of Onset  . Prostate cancer Father   . Colon cancer Father   . Diabetes Father   . Arthritis Father   . Diabetes type II Mother   . Atrial fibrillation Mother   . Hypertension Mother   . Arthritis Mother   . Cancer Other        Colon,Breast, Prostate Cancer  . Hypertension Other   . Prostate cancer Brother        5 brothers - 2 were dx with prostate cancer  . Esophageal cancer Neg Hx   . Stomach cancer Neg Hx   . Rectal cancer Neg Hx     Social History Social History   Tobacco Use  . Smoking status: Former Smoker    Types: Cigarettes    Last attempt to quit: 07/30/2011    Years since quitting: 7.1  . Smokeless tobacco: Never Used  . Tobacco comment: Smoked since age 35  Substance Use Topics  . Alcohol use: Yes    Alcohol/week: 1.0 standard drinks    Types: 1 Standard drinks or equivalent per week  . Drug use: No     Allergies   Hydralazine; Lisinopril; Penicillins; and Tribenzor [olmesartan-amlodipine-hctz]   Review of Systems Review of Systems  Constitutional: Negative for chills and fever.  HENT: Negative for ear pain and sore throat.   Eyes: Negative for pain and visual disturbance.  Respiratory: Negative for cough and shortness of breath.   Cardiovascular: Negative for chest pain and palpitations.  Gastrointestinal: Negative for abdominal pain and vomiting.  Genitourinary: Negative for dysuria and hematuria.  Musculoskeletal: Negative for arthralgias and back pain.  Skin: Negative for color change and rash.  Neurological: Positive for weakness. Negative for seizures, syncope and headaches.  All other systems reviewed and are negative.    Physical Exam Updated Vital Signs BP 125/88   Pulse 73   Temp 97.8 F (36.6 C) (Oral)   Resp 14   SpO2 99%   Physical Exam Vitals signs and nursing note reviewed.    Constitutional:      Appearance: He is well-developed.  HENT:     Head: Normocephalic and atraumatic.  Eyes:     Conjunctiva/sclera: Conjunctivae normal.  Neck:     Musculoskeletal: Neck supple.  Cardiovascular:     Rate and Rhythm: Normal rate and regular rhythm.     Heart sounds: No murmur.  Pulmonary:     Effort: Pulmonary effort is normal. No respiratory distress.     Breath sounds:  Normal breath sounds.  Abdominal:     Palpations: Abdomen is soft.     Tenderness: There is no abdominal tenderness.  Musculoskeletal: Normal range of motion.        General: No swelling or tenderness.  Skin:    General: Skin is warm and dry.  Neurological:     General: No focal deficit present.     Mental Status: He is alert and oriented to person, place, and time. Mental status is at baseline.     Cranial Nerves: No cranial nerve deficit.     Sensory: No sensory deficit.      ED Treatments / Results  Labs (all labs ordered are listed, but only abnormal results are displayed) Labs Reviewed  COMPREHENSIVE METABOLIC PANEL - Abnormal; Notable for the following components:      Result Value   Potassium 3.2 (*)    Glucose, Bld 137 (*)    Creatinine, Ser 1.52 (*)    Calcium 8.7 (*)    Albumin 3.1 (*)    Total Bilirubin 1.9 (*)    GFR calc non Af Amer 46 (*)    GFR calc Af Amer 53 (*)    All other components within normal limits  CBC WITH DIFFERENTIAL/PLATELET - Abnormal; Notable for the following components:   RBC 4.19 (*)    Hemoglobin 12.9 (*)    All other components within normal limits  MAGNESIUM - Abnormal; Notable for the following components:   Magnesium 1.6 (*)    All other components within normal limits  URINALYSIS, ROUTINE W REFLEX MICROSCOPIC  PROTIME-INR  PHOSPHORUS    EKG EKG Interpretation  Date/Time:  Friday September 11 2018 16:30:35 EST Ventricular Rate:  105 PR Interval:    QRS Duration: 133 QT Interval:  380 QTC Calculation: 503 R Axis:   -11 Text  Interpretation:  Atrial fibrillation Ventricular premature complex Right bundle branch block Borderline ST elevation, lateral leads No significant change was found Confirmed by Jola Schmidt [phone]) on 09/11/2018 7:27:19 PM   Radiology Ct Abdomen W Wo Contrast  Result Date: 09/11/2018 CLINICAL DATA:  68 year old male with history of pancreatic mass diagnosed in December 2019 status post pancreatic stent placement on 08/13/2018. EXAM: CT ABDOMEN WITHOUT AND WITH CONTRAST TECHNIQUE: Multidetector CT imaging of the abdomen was performed following the standard protocol before and following the bolus administration of intravenous contrast. CONTRAST:  170mL OMNIPAQUE IOHEXOL 300 MG/ML  SOLN COMPARISON:  CT the abdomen and pelvis 07/27/2018. MRI of the abdomen 07/10/2018. FINDINGS: Lower chest: Atherosclerotic calcifications in the left anterior descending and right coronary artery. Small to moderate hiatal hernia. Hepatobiliary: No suspicious cystic or solid hepatic lesions. No intra hepatic biliary ductal dilatation. Pneumobilia related to prior sphincterotomy. Common bile duct stent noted in the common bile duct. Small amount of amorphous high attenuation material in the gallbladder, new compared to the prior study, likely related to retained contrast material from recent ERCP. Gallbladder is nearly completely decompressed. Pancreas: Again noted is a large ill-defined heterogeneously enhancing mass in the head of the pancreas estimated to measure approximately 5.7 x 3.2 x 5.6 cm (axial image 53 of series 7 and coronal image 48 of series 14). Dilatation of the main pancreatic duct measuring up to 6 mm in the body of the pancreas. No peripancreatic fluid collections or inflammatory changes. Spleen: Unremarkable. Adrenals/Urinary Tract: Low-attenuation lesions in both kidneys, largest of which are compatible with simple cysts, with the largest of these in the medial aspect of the upper pole  of the right kidney  measuring 2.1 cm in diameter. Other subcentimeter low-attenuation lesions in both kidneys, too small to characterize, but statistically likely to represent tiny cysts. No hydroureteronephrosis in the visualized portions of the abdomen. Bilateral adrenal glands are normal in appearance. Stomach/Bowel: Normal appearance of the stomach. No pathologic dilatation of visualized portions of small bowel or colon. Several scattered colonic diverticulae are noted, without surrounding inflammatory changes to suggest an acute diverticulitis at this time. Vascular/Lymphatic: Aortic atherosclerosis, without evidence of aneurysm or dissection in the abdominal vasculature. The previously described pancreatic mass makes contact with the splenoportal confluence and proximal portal vein, without significant narrowing. The lesion is beneath the level of the common hepatic artery which appears widely patent. The medial aspect of the lesion comes in direct contact with the superior mesenteric artery proximally (axial image 40 of series 7) with complete obscuration of the intervening fat plane. On coronal imaging this soft tissue partially encases the superior mesenteric artery (axial image 46 of series 12) from approximately 8:00 to 1:00. Ill-defined soft tissue paralleling the celiac axis on the right side measuring 11 mm in short axis, more prominent than the prior study, either developing lymphadenopathy or direct infiltration of tumor (axial image 35 of series 4). Other: No significant volume of ascites noted in the visualized portions of the peritoneal cavity. Small supraumbilical ventral hernia containing only omental fat. Musculoskeletal: No aggressive appearing osseous lesions are noted in the visualized portions of the skeleton. IMPRESSION: 1. Pancreatic mass again noted in the head of the pancreas intimately associated with the distal aspect of the common bile duct (which has been stented). This mass appears slightly increased  in size compared to prior examinations, is associated with mild pancreatic ductal dilatation, and is closely associated with numerous vascular structures, including the proximal superior mesenteric artery which appears partially encased. Increasing soft tissue adjacent to the celiac axis is also concerning for lymphadenopathy in the upper retroperitoneum. 2. Small ventral hernia containing only omental fat. 3. Small to moderate hiatal hernia. 4. Colonic diverticulosis. 5. Aortic atherosclerosis, in addition to least 2 vessel coronary artery disease. Please note that although the presence of coronary artery calcium documents the presence of coronary artery disease, the severity of this disease and any potential stenosis cannot be assessed on this non-gated CT examination. Assessment for potential risk factor modification, dietary therapy or pharmacologic therapy may be warranted, if clinically indicated. 6. Additional incidental findings, as above. Electronically Signed   By: Vinnie Langton M.D.   On: 09/11/2018 09:03   Dg Chest Portable 1 View  Result Date: 09/11/2018 CLINICAL DATA:  Cough and congestion, atrial fibrillation. EXAM: PORTABLE CHEST 1 VIEW COMPARISON:  09/29/2012 FINDINGS: The heart size and mediastinal contours are within normal limits. Nonaneurysmal atherosclerotic aorta is again noted at the arch. Both lungs are clear. The visualized skeletal structures are unremarkable. IMPRESSION: No active disease. Electronically Signed   By: Ashley Royalty M.D.   On: 09/11/2018 16:14    Procedures Procedures (including critical care time)  Medications Ordered in ED Medications  lactated ringers bolus 1,000 mL (0 mLs Intravenous Stopped 09/11/18 1838)  diltiazem (CARDIZEM) injection 20 mg (20 mg Intravenous Given 09/11/18 1852)  ondansetron (ZOFRAN) injection 4 mg (4 mg Intravenous Given 09/11/18 1851)  metoprolol succinate (TOPROL-XL) 24 hr tablet 50 mg (50 mg Oral Given 09/11/18 2038)     Initial  Impression / Assessment and Plan / ED Course  I have reviewed the triage vital signs and the nursing  notes.  Pertinent labs & imaging results that were available during my care of the patient were reviewed by me and considered in my medical decision making (see chart for details).     MDM:  68 y.o.malewith prior history of DM-2, hypertension, COPD, atrial fibrillation on anticoagulation-presented with feelings of fatigue, accelerated heart rate, A. Fib.  Patient is not currently on any rate control management however is on anticoagulation.  Patient's symptoms likely secondary due to A. Fib.  Patient denies any infectious-like symptoms such as cough, fever, night sweats.  Patient also denies any chest pain, shortness of breath, lower peripheral swelling or any other complaints at this time.  Physical exam positive for irregular heart rhythm on monitor, tachycardia however patient resting comfortably, talking in complete sentences, no acute distress.  Hemodynamically stable.  We will obtain laboratory studies including CBC, CMP, checks x-ray as well as EKG here our facility.  We will give fluid bolus.  Discussed with pharmacy regarding appropriate rhythm control management due to the patient being asymptomatic and would prefer to not be in the hospital.  Laboratory studies indicate relative baseline findings, no acute intervention necessary at this time.  Patient given IV push Cardizem 20 mg with rate control here in the emergency department.  Patient continues to deny any chest pain, shortness of breath.  On further discussion with the patient indicates that he is run out of his metoprolol and has not taken it today.  Likely the etiology of the patient's uncontrolled rate.  We will give patient home dose here in the emergency department as well as provide prescription.  Patient is to follow-up with A. fib clinic as well as cardiology service.  The above care was discussed and agreed upon by my  attending physician.  Final Clinical Impressions(s) / ED Diagnoses   Final diagnoses:  Paroxysmal atrial fibrillation [facility])    ED Discharge Orders         Ordered    Amb referral to AFIB Clinic     09/11/18 1612    metoprolol succinate (TOPROL-XL) 50 MG 24 hr tablet  Daily     09/11/18 2027           Orson Aloe, MD 09/11/18 2324    Jola Schmidt, MD 09/13/18 [phone]

## 2018-09-11 NOTE — ED Notes (Signed)
Patient denies pain and is resting comfortably.  

## 2018-09-11 NOTE — ED Notes (Signed)
Pt. Ambulated throughout the hallway. No SOB/Dizziness or CP stated by the pt. Pt. Back on monitor.

## 2018-09-11 NOTE — ED Triage Notes (Signed)
Pt BIB [facility] from home c/o generalized weakness since this morning. States he felt his heart start to "flutter" this morning as well. Hx of a-fib and is on eloquis. VSS for EMS.

## 2018-09-14 ENCOUNTER — Other Ambulatory Visit: Payer: Self-pay

## 2018-09-14 ENCOUNTER — Telehealth: Payer: Self-pay | Admitting: Gastroenterology

## 2018-09-14 ENCOUNTER — Telehealth: Payer: Self-pay

## 2018-09-14 ENCOUNTER — Telehealth (HOSPITAL_COMMUNITY): Payer: Self-pay | Admitting: *Deleted

## 2018-09-14 NOTE — Telephone Encounter (Signed)
See other phone note

## 2018-09-14 NOTE — Telephone Encounter (Signed)
Pt would like to speak with you regarding scheduling a procedure.

## 2018-09-14 NOTE — Telephone Encounter (Signed)
Left message on machine to call back  

## 2018-09-14 NOTE — Telephone Encounter (Signed)
[facility] Pre-operative Risk Assessment     Request for surgical clearance:     Endoscopy Procedure  What type of surgery is being performed?     EUS  When is this surgery scheduled?     2/20 Please respond ASAP   What type of clearance is required ?   Pharmacy  Are there any medications that need to be held prior to surgery and how long? Eliquis  Practice name and name of physician performing surgery?      [facility]  What is your office phone and fax number?      Phone- [phone]  [phone]  Anesthesia type (None, local, MAC, general) ?       MAC

## 2018-09-14 NOTE — Telephone Encounter (Signed)
Patient with diagnosis of atrial fibrillation on Eliquis for anticoagulation.    Procedure: EUS Date of procedure: 09/17/2018  CHADS2-VASc score of  5 (, HTN, AGE, DM2, stroke/tia x 2,)  CrCl 51  Platelet count 377  Per office protocol, patient can hold Eliquis for 2 days prior to procedure.

## 2018-09-14 NOTE — Telephone Encounter (Signed)
EUS scheduled, pt instructed and medications reviewed.  Patient instructions mailed to home.  Patient to call with any questions or concerns. The pt was also advised to stop Eliquis 2 days prior

## 2018-09-14 NOTE — Telephone Encounter (Signed)
LMOM for pt to clbk to sched follow up from ED 

## 2018-09-15 NOTE — Telephone Encounter (Signed)
appt has been moved to 3/12 per pt he is unable to make the appt for this Thursday.  Instructed and instructions mailed to the home.  Dr Jac Canavan he can not make app this Thurs.

## 2018-09-15 NOTE — Telephone Encounter (Signed)
Can you please see if February 27 or March 5 will work for him.  I should still have time available for both of those days and I recommend that he have this done sooner rather than later.

## 2018-09-15 NOTE — Telephone Encounter (Signed)
I offered those appointments as well and he states he has to work with his daughters schedule and that is the only day she can be with him.  I did advise him that this is something you prefer he not wait to have done and he told me he understood but just has no one else to be with him.  (His daughter lives in [place].)

## 2018-09-16 ENCOUNTER — Telehealth: Payer: Self-pay | Admitting: Cardiology

## 2018-09-16 ENCOUNTER — Other Ambulatory Visit: Payer: Self-pay | Admitting: Internal Medicine

## 2018-09-16 DIAGNOSIS — G459 Transient cerebral ischemic attack, unspecified: Secondary | ICD-10-CM

## 2018-09-16 NOTE — Telephone Encounter (Signed)
Spoke with pt, 30 day card and application for assistance placed at the front desk for patient pick up.

## 2018-09-16 NOTE — Telephone Encounter (Signed)
Spoke to patient he stated Eliquis too expensive.Advised discount card only applies to [employer] not medicare.Advised I will send message to Dr.Crenshaw's RN for patient assistance.

## 2018-09-16 NOTE — Telephone Encounter (Signed)
Pt c/o medication issue:  1. Name of Medication: apixaban (ELIQUIS) 5 MG TABS tablet  2. How are you currently taking this medication (dosage and times per day)? As prescribed  3. Are you having a reaction (difficulty breathing--STAT)? no  4. What is your medication issue? Medication is too expensive with his new insurance. He was calling because the Pharmacy told him the Doctor may have a discount card for the medication

## 2018-09-18 ENCOUNTER — Telehealth: Payer: Self-pay | Admitting: Cardiology

## 2018-09-18 NOTE — Telephone Encounter (Signed)
New Message .         Patient calling in inquiring about a discount card for "Eliquis". Would like a call back

## 2018-09-19 ENCOUNTER — Other Ambulatory Visit: Payer: Self-pay | Admitting: Internal Medicine

## 2018-09-21 ENCOUNTER — Encounter: Payer: Self-pay | Admitting: Gastroenterology

## 2018-09-21 ENCOUNTER — Ambulatory Visit (INDEPENDENT_AMBULATORY_CARE_PROVIDER_SITE_OTHER): Payer: Medicare Other | Admitting: Gastroenterology

## 2018-09-21 VITALS — BP 110/70 | HR 52 | Ht 70.0 in | Wt 165.6 lb

## 2018-09-21 DIAGNOSIS — R1013 Epigastric pain: Secondary | ICD-10-CM | POA: Diagnosis not present

## 2018-09-21 DIAGNOSIS — K8689 Other specified diseases of pancreas: Secondary | ICD-10-CM

## 2018-09-21 DIAGNOSIS — K5900 Constipation, unspecified: Secondary | ICD-10-CM | POA: Diagnosis not present

## 2018-09-21 MED ORDER — NALOXEGOL OXALATE 25 MG PO TABS: 25.0000 mg | ORAL_TABLET | Freq: Every day | ORAL | 11 refills | Status: DC

## 2018-09-21 NOTE — H&P (View-Only) (Signed)
P  Chief Complaint:    HOP mass, abdominal pain  GI History: 68 year old male admitted in December 2019 with right flank pain and elevated liver enzymes, with AST/ALT/ALP 380/401/199 with T bili 9.8.  Evaluation to date: -CT abdomen/pelvis (07/27/2018): No renal stones, diverticulosis, otherwise normal -RUQ [place] (07/28/2018): GB sludge and CBD 1 cm -MRCP (07/29/2018): 4.8 x 3.3 x 5.4 cm HOP mass with occlusion and abrupt cut off of CBD - ERCP (07/30/2018, Dr. Fuller Plan): Malignant appearing stricture in the lower CBD.  Brushings with atypical cells, o/w negative.  Plastic stent placed - ERCP (08/13/2018, Dr. Ardis Hughs): Stent migration, replaced with covered biliary stent, additional brushings (atypical cells). -EUS (08/13/2018, Dr. Ardis Hughs): FNA of pancreatic mass came back negative for malignancy but with atypical cells -CT (09/10/2018): Ill-defined HOP mass measuring 5.7 x 3.2 x 5.6 cm with dilation of the main PD up to 6 mm in the body.  Mass contacts splenoportal confluence and proximal portal vein and closely associated with/contacts proximal SMA (partial encasement) and adjacent to celiac axis (extension for LAD?). No inflammatory changes.   HPI:    Patient is a 68 y.o. male presenting [facility] for follow-up.  He was most recently seen by Dr. Carlean Purl on 09/01/2018 for follow-up from recent hospitalization. At that time, endorsed constipation and difficulty sleeping due to epigastric pain, but overall improved.  Was given Rx for Vicodin for nocturnal abdominal pain to aid in sleeping.  Improved LAE's since repeat ERCP with metal stent placement on 08/13/2018.  Most recent labs with normal AST/ALT/ALP and T bili 1.9 (from 2.7, 5.2, 23.3).  Creatinine 1.5.  CBC essentially normal and INR 1.16 (stable).   Given high clinical suspicion for underlying malignancy, sent for short interval repeat CT. CT repeated 09/10/18 and notable for increased size of HOP mass with possible involvement of  the SMA and PV as outlined above.   Today he states he conitnues to have gnawing MEG pain. No radiation and no associated n/v or change in bowel habits. Pain disrupts his sleep. Good PO intake. Not exacerbated by PO intake. This pain present since metal stent placed. Having regular BMs but still occasional straining. No f/c and weight is stable.    Review of systems:     No chest pain, no SOB, no fevers, no urinary sx   Past Medical History:  Diagnosis Date   Allergy    Asthma    Atrial fibrillation ([place])    a. initially occuring in 2014, has declined anticoagulation since having a GI bleed with initiation of Xarelto   Cardiomyopathy 2011   a. Patient was told that he had an ejection fraction of 37% by echo done elsewhere in 2011 b. 06/2014: echo showing preserved EF of 55-60%, severe asymetric septal hypertrophy with no SAM or LVOT gradient   COPD (chronic obstructive pulmonary disease) ([place])    Diabetes mellitus without complication ([place])    Diverticulitis    GERD (gastroesophageal reflux disease)    History of colon polyps    Hypertension    Tobacco abuse     Patient's surgical history, family medical history, social history, medications and allergies were all reviewed in Epic    Current Outpatient Medications  Medication Sig Dispense Refill   acetaminophen (TYLENOL) 500 MG tablet Take 1,000 mg by mouth every 6 (six) hours as needed (pain).     albuterol (PROVENTIL HFA;VENTOLIN HFA) 108 (90 Base) MCG/ACT inhaler Inhale 2 puffs into the lungs every 6 (six) hours  as needed for wheezing. 18 g 5   amLODipine (NORVASC) 5 MG tablet TAKE 1 TABLET BY MOUTH ONCE DAILY (Patient taking differently: Take 5 mg by mouth daily. ) 90 tablet 1   apixaban (ELIQUIS) 5 MG TABS tablet Take 1 tablet (5 mg total) by mouth 2 (two) times daily. 60 tablet 6   fish oil-omega-3 fatty acids 1000 MG capsule Take 2 g by mouth at bedtime.      glucose blood (ONETOUCH VERIO) test strip USE AS  INSTRUCTED THREE TIMES DAILY. DX CODE E11.21 100 each 11   Glycopyrrolate-Formoterol (BEVESPI AEROSPHERE) 9-4.8 MCG/ACT AERO Inhale 2 Act 2 (two) times daily into the lungs. 10.7 g 11   hydrochlorothiazide (HYDRODIURIL) 25 MG tablet TAKE 1 TABLET BY MOUTH ONCE DAILY (Patient taking differently: Take 25 mg by mouth daily. ) 90 tablet 0   HYDROcodone-acetaminophen (NORCO/VICODIN) 5-325 MG tablet Take 1 tablet by mouth every 6 (six) hours as needed for moderate pain. 10 tablet 0   Insulin Detemir (LEVEMIR FLEXPEN) 100 UNIT/ML Pen Inject 15 units once daily. (Patient taking differently: Inject 15 Units into the skin at bedtime. ) 5 pen 1   insulin regular (NOVOLIN R RELION) 100 units/mL injection INJECT 5-8 UNITS SUBCUTANEOUSLY THREE TIMES DAILY before meals (Patient taking differently: Inject 5-8 Units into the skin 3 (three) times daily as needed for high blood sugar (CBG >120). ) 10 mL 3   Insulin Syringe-Needle U-100 (EASY TOUCH INSULIN SYRINGE) 31G X 5/16" 0.3 ML MISC USE AS DIRECTED 4 TIMES DAILY 200 each 4   levocetirizine (XYZAL) 5 MG tablet Take 1 tablet (5 mg total) by mouth every evening. 90 tablet 1   losartan (COZAAR) 100 MG tablet TAKE 1 TABLET BY MOUTH ONCE DAILY (Patient taking differently: Take 100 mg by mouth daily. ) 90 tablet 1   metoprolol succinate (TOPROL-XL) 50 MG 24 hr tablet TAKE 1 TABLET BY MOUTH ONCE DAILY WITH  OR  IMMEDIATELY  FOLLOWING  A  MEAL (Patient taking differently: Take 50 mg by mouth daily. ) 30 tablet 6   metoprolol succinate (TOPROL-XL) 50 MG 24 hr tablet Take 1 tablet (50 mg total) by mouth daily for 30 days. 30 tablet 0   montelukast (SINGULAIR) 10 MG tablet Take 1 tablet (10 mg total) by mouth at bedtime. 90 tablet 1   Multiple Vitamin (MULTIVITAMIN WITH MINERALS) TABS tablet Take 1 tablet by mouth daily.     omeprazole (PRILOSEC) 20 MG capsule TAKE 1 CAPSULE BY MOUTH ONCE DAILY (Patient taking differently: Take 20 mg by mouth daily. ) 30 capsule 5     No current facility-administered medications for this visit.     Physical Exam:     BP 110/70    Pulse (!) 52    Ht 5\' 10"  (1.778 m)    Wt 165 lb 9.6 oz (75.1 kg)    BMI 23.76 kg/m   GENERAL:  Pleasant male in NAD PSYCH: : Cooperative, normal affect EENT:  conjunctiva pink, mucous membranes moist, neck supple without masses CARDIAC:  RRR, no murmur heard, no peripheral edema PULM: Normal respiratory effort, lungs CTA bilaterally, no wheezing ABDOMEN:  Nondistended, soft, nontender. No obvious masses, no hepatomegaly,  normal bowel sounds SKIN:  turgor, no lesions seen Musculoskeletal:  Normal muscle tone, normal strength NEURO: Alert and oriented x 3, no focal neurologic deficits   IMPRESSION and PLAN:    #1. Head of Pancreas Mass: HOP mass that has increased in size on short interval repeat CT,  with possible vascular involvement and ?involvement of Celiac axis. We discussed the high clinical suspicion for Pancreatic CA despite the inconclusive path to date with plan as below:   -He is already scheduled for repeat EUS with FNA with Dr. Ardis Hughs for next week.  Pending results, will likely proceed with surgical referral and possible review at tumor board.      #2.  Constipation: - Trial course of Movantik for possible OIC.  Provided with samples today. - Resume high fiber diet - Resume adequate fluid intake  #3.  Abdominal pain: Midepigastric pain which is relieved with previous Rx for Vicodin.  Able to get some sleep with the pain medication.  Discussed ongoing pain meds, which he held off on for today in favor of trying to treat some component of OIC with course of Movantik as above.  To review again at follow-up appointment.   I spent a total of 15 minutes of face-to-face time with the patient. Greater than 50% of the time was spent counseling and coordinating care.        Lavena Bullion ,DO, FACG 09/21/2018, 10:29 AM

## 2018-09-21 NOTE — Progress Notes (Signed)
P  Chief Complaint:    HOP mass, abdominal pain  GI History: 68 year old male admitted in December 2019 with right flank pain and elevated liver enzymes, with AST/ALT/ALP 380/401/199 with T bili 9.8.  Evaluation to date: -CT abdomen/pelvis (07/27/2018): No renal stones, diverticulosis, otherwise normal -RUQ [place] (07/28/2018): GB sludge and CBD 1 cm -MRCP (07/29/2018): 4.8 x 3.3 x 5.4 cm HOP mass with occlusion and abrupt cut off of CBD - ERCP (07/30/2018, Dr. Fuller Plan): Malignant appearing stricture in the lower CBD.  Brushings with atypical cells, o/w negative.  Plastic stent placed - ERCP (08/13/2018, Dr. Ardis Hughs): Stent migration, replaced with covered biliary stent, additional brushings (atypical cells). -EUS (08/13/2018, Dr. Ardis Hughs): FNA of pancreatic mass came back negative for malignancy but with atypical cells -CT (09/10/2018): Ill-defined HOP mass measuring 5.7 x 3.2 x 5.6 cm with dilation of the main PD up to 6 mm in the body.  Mass contacts splenoportal confluence and proximal portal vein and closely associated with/contacts proximal SMA (partial encasement) and adjacent to celiac axis (extension for LAD?). No inflammatory changes.   HPI:    Patient is a 68 y.o. male presenting to the [facility]ology Clinic for follow-up.  He was most recently seen by Dr. Carlean Purl on 09/01/2018 for follow-up from recent hospitalization. At that time, endorsed constipation and difficulty sleeping due to epigastric pain, but overall improved.  Was given Rx for Vicodin for nocturnal abdominal pain to aid in sleeping.  Improved LAE's since repeat ERCP with metal stent placement on 08/13/2018.  Most recent labs with normal AST/ALT/ALP and T bili 1.9 (from 2.7, 5.2, 23.3).  Creatinine 1.5.  CBC essentially normal and INR 1.16 (stable).   Given high clinical suspicion for underlying malignancy, sent for short interval repeat CT. CT repeated 09/10/18 and notable for increased size of HOP mass with possible involvement of  the SMA and PV as outlined above.   Today he states he conitnues to have gnawing MEG pain. No radiation and no associated n/v or change in bowel habits. Pain disrupts his sleep. Good PO intake. Not exacerbated by PO intake. This pain present since metal stent placed. Having regular BMs but still occasional straining. No f/c and weight is stable.    Review of systems:     No chest pain, no SOB, no fevers, no urinary sx   Past Medical History:  Diagnosis Date  . Allergy   . Asthma   . Atrial fibrillation ([place])    a. initially occuring in 2014, has declined anticoagulation since having a GI bleed with initiation of Xarelto  . Cardiomyopathy 2011   a. Patient was told that he had an ejection fraction of 37% by echo done elsewhere in 2011 b. 06/2014: echo showing preserved EF of 55-60%, severe asymetric septal hypertrophy with no SAM or LVOT gradient  . COPD (chronic obstructive pulmonary disease) ([place])   . Diabetes mellitus without complication ([place])   . Diverticulitis   . GERD (gastroesophageal reflux disease)   . History of colon polyps   . Hypertension   . Tobacco abuse     Patient's surgical history, family medical history, social history, medications and allergies were all reviewed in Epic    Current Outpatient Medications  Medication Sig Dispense Refill  . acetaminophen (TYLENOL) 500 MG tablet Take 1,000 mg by mouth every 6 (six) hours as needed (pain).    Marland Kitchen albuterol (PROVENTIL HFA;VENTOLIN HFA) 108 (90 Base) MCG/ACT inhaler Inhale 2 puffs into the lungs every 6 (six) hours  as needed for wheezing. 18 g 5  . amLODipine (NORVASC) 5 MG tablet TAKE 1 TABLET BY MOUTH ONCE DAILY (Patient taking differently: Take 5 mg by mouth daily. ) 90 tablet 1  . apixaban (ELIQUIS) 5 MG TABS tablet Take 1 tablet (5 mg total) by mouth 2 (two) times daily. 60 tablet 6  . fish oil-omega-3 fatty acids 1000 MG capsule Take 2 g by mouth at bedtime.     Marland Kitchen glucose blood (ONETOUCH VERIO) test strip USE AS  INSTRUCTED THREE TIMES DAILY. DX CODE E11.21 100 each 11  . Glycopyrrolate-Formoterol (BEVESPI AEROSPHERE) 9-4.8 MCG/ACT AERO Inhale 2 Act 2 (two) times daily into the lungs. 10.7 g 11  . hydrochlorothiazide (HYDRODIURIL) 25 MG tablet TAKE 1 TABLET BY MOUTH ONCE DAILY (Patient taking differently: Take 25 mg by mouth daily. ) 90 tablet 0  . HYDROcodone-acetaminophen (NORCO/VICODIN) 5-325 MG tablet Take 1 tablet by mouth every 6 (six) hours as needed for moderate pain. 10 tablet 0  . Insulin Detemir (LEVEMIR FLEXPEN) 100 UNIT/ML Pen Inject 15 units once daily. (Patient taking differently: Inject 15 Units into the skin at bedtime. ) 5 pen 1  . insulin regular (NOVOLIN R RELION) 100 units/mL injection INJECT 5-8 UNITS SUBCUTANEOUSLY THREE TIMES DAILY before meals (Patient taking differently: Inject 5-8 Units into the skin 3 (three) times daily as needed for high blood sugar (CBG >120). ) 10 mL 3  . Insulin Syringe-Needle U-100 (EASY TOUCH INSULIN SYRINGE) 31G X 5/16" 0.3 ML MISC USE AS DIRECTED 4 TIMES DAILY 200 each 4  . levocetirizine (XYZAL) 5 MG tablet Take 1 tablet (5 mg total) by mouth every evening. 90 tablet 1  . losartan (COZAAR) 100 MG tablet TAKE 1 TABLET BY MOUTH ONCE DAILY (Patient taking differently: Take 100 mg by mouth daily. ) 90 tablet 1  . metoprolol succinate (TOPROL-XL) 50 MG 24 hr tablet TAKE 1 TABLET BY MOUTH ONCE DAILY WITH  OR  IMMEDIATELY  FOLLOWING  A  MEAL (Patient taking differently: Take 50 mg by mouth daily. ) 30 tablet 6  . metoprolol succinate (TOPROL-XL) 50 MG 24 hr tablet Take 1 tablet (50 mg total) by mouth daily for 30 days. 30 tablet 0  . montelukast (SINGULAIR) 10 MG tablet Take 1 tablet (10 mg total) by mouth at bedtime. 90 tablet 1  . Multiple Vitamin (MULTIVITAMIN WITH MINERALS) TABS tablet Take 1 tablet by mouth daily.    Marland Kitchen omeprazole (PRILOSEC) 20 MG capsule TAKE 1 CAPSULE BY MOUTH ONCE DAILY (Patient taking differently: Take 20 mg by mouth daily. ) 30 capsule 5     No current facility-administered medications for this visit.     Physical Exam:     BP 110/70   Pulse (!) 52   Ht 5\' 10"  (1.778 m)   Wt 165 lb 9.6 oz (75.1 kg)   BMI 23.76 kg/m   GENERAL:  Pleasant male in NAD PSYCH: : Cooperative, normal affect EENT:  conjunctiva pink, mucous membranes moist, neck supple without masses CARDIAC:  RRR, no murmur heard, no peripheral edema PULM: Normal respiratory effort, lungs CTA bilaterally, no wheezing ABDOMEN:  Nondistended, soft, nontender. No obvious masses, no hepatomegaly,  normal bowel sounds SKIN:  turgor, no lesions seen Musculoskeletal:  Normal muscle tone, normal strength NEURO: Alert and oriented x 3, no focal neurologic deficits   IMPRESSION and PLAN:    #1. Head of Pancreas Mass: HOP mass that has increased in size on short interval repeat CT, with possible vascular involvement  and ?involvement of Celiac axis. We discussed the high clinical suspicion for Pancreatic CA despite the inconclusive path to date with plan as below:   -He is already scheduled for repeat EUS with FNA with Dr. Ardis Hughs for next week.  Pending results, will likely proceed with surgical referral and possible review at tumor board.      #2.  Constipation: - Trial course of Movantik for possible OIC.  Provided with samples today. - Resume high fiber diet - Resume adequate fluid intake  #3.  Abdominal pain: Midepigastric pain which is relieved with previous Rx for Vicodin.  Able to get some sleep with the pain medication.  Discussed ongoing pain meds, which he held off on for today in favor of trying to treat some component of OIC with course of Movantik as above.  To review again at follow-up appointment.   I spent a total of 15 minutes of face-to-face time with the patient. Greater than 50% of the time was spent counseling and coordinating care.        Lavena Bullion ,DO, FACG 09/21/2018, 10:29 AM

## 2018-09-21 NOTE — Patient Instructions (Signed)
If you are age 70 or older, your body mass index should be between 23-30. Your Body mass index is 23.76 kg/m. If this is out of the aforementioned range listed, please consider follow up with your Primary Care Provider.  If you are age 49 or younger, your body mass index should be between 19-25. Your Body mass index is 23.76 kg/m. If this is out of the aformentioned range listed, please consider follow up with your Primary Care Provider.   We have sent the following medications to your pharmacy for you to pick up at your convenience: Movantik 25mg   You have been give a sample of  Movantik 25mg  (15 tablets)  It was a pleasure to see you today!  Vito Cirigliano, D.O.

## 2018-09-22 NOTE — Telephone Encounter (Signed)
Pt advised there is already a 30 day card and pt assistance form at the front desk and he will pick up tomorrow and return to [place] after he fills out his portion.

## 2018-09-22 NOTE — Telephone Encounter (Signed)
New Message        Patient is returning a call, not sure who called patient.

## 2018-09-22 NOTE — Telephone Encounter (Signed)
LMTCB

## 2018-09-24 ENCOUNTER — Telehealth: Payer: Self-pay | Admitting: Internal Medicine

## 2018-09-24 NOTE — Telephone Encounter (Signed)
Copied from [place] [id number]. Topic: Quick Communication - Rx Refill/Question >> Sep 24, 2018  2:57 PM Lillie Fragmin Krista Blue wrote: Medication: clopidogrel bisulfate PLAVIX tablets  Has the patient contacted their pharmacy? Yes.    (Agent: If yes, when and what did the pharmacy advise?) Pharmacy sent renewal form to practice. No response from practice  Preferred Pharmacy (with phone number or street name): [place], [address] [place] [phone] (Phone) [phone] (Fax)    Agent: Please be advised that RX refills may take up to 3 business days. We ask that you follow-up with your pharmacy.

## 2018-09-24 NOTE — Telephone Encounter (Signed)
Patient requesting Plavix refill. This medication was discontinued in [place] by Dr. Stanford Breed. TC to patient. Left VM that this medication is no longer on his medication list.

## 2018-09-25 ENCOUNTER — Other Ambulatory Visit: Payer: Self-pay | Admitting: Internal Medicine

## 2018-09-25 DIAGNOSIS — G459 Transient cerebral ischemic attack, unspecified: Secondary | ICD-10-CM

## 2018-09-28 ENCOUNTER — Other Ambulatory Visit: Payer: Self-pay | Admitting: Internal Medicine

## 2018-10-05 ENCOUNTER — Telehealth: Payer: Self-pay | Admitting: Cardiology

## 2018-10-05 ENCOUNTER — Telehealth: Payer: Self-pay | Admitting: Gastroenterology

## 2018-10-05 NOTE — Telephone Encounter (Signed)
Please advise on the plan of care for this patient-as is stated in the 09/21/2018 OV note:  He is already scheduled for repeat EUS (10/08/2018) with FNA with Dr. Ardis Hughs for next week.  Pending results, will likely proceed with surgical referral and possible review at tumor board.      Please advise

## 2018-10-05 NOTE — Telephone Encounter (Signed)
Pt inquired whether his scheduled procedure at [facility] 3.12.20 is necessary.  He stated that during his last visit with Dr. Bryan Lemma, he thought that "we were going to go with a different route."  He requested a CB to discuss.

## 2018-10-05 NOTE — Telephone Encounter (Signed)
Yes, this is correct. The procedure is necessary. I spoke with Dr. Ardis Hughs that week and he agrees with the plan for EUS. Please relay to the patient. Thank you.

## 2018-10-05 NOTE — Telephone Encounter (Signed)
Called and spoke with patient-patient informed of MD recommendations of proceeding with scheduled procedure to be done by Dr. Ardis Hughs; patient is agreeable with plan of care and was also advised to call back to the office should questions/concerns arise; patient verbalized understanding of information/instructions;

## 2018-10-05 NOTE — Telephone Encounter (Signed)
Pt called has his paper work for [employer] filled out he will be bringing by the office this afternoon at lunch time, he said he would pt attention Hilda Blades on the paperwork

## 2018-10-05 NOTE — Telephone Encounter (Signed)
New Message   Pt is calling because he has the [facility] form for medication filled out and wants to know if he needs to mail it in or drop it off Please call back

## 2018-10-07 NOTE — Telephone Encounter (Signed)
Left message for pt to call, I never got the paperwork. ? Did he bring it by and what office was it brought to?

## 2018-10-08 ENCOUNTER — Ambulatory Visit (HOSPITAL_COMMUNITY)
Admission: RE | Admit: 2018-10-08 | Discharge: 2018-10-08 | Disposition: A | Discharge status: Home / Self Care | Payer: Medicare Other | Attending: Gastroenterology | Admitting: Gastroenterology

## 2018-10-08 ENCOUNTER — Ambulatory Visit (HOSPITAL_COMMUNITY): Payer: Medicare Other | Admitting: Anesthesiology

## 2018-10-08 ENCOUNTER — Telehealth: Payer: Self-pay

## 2018-10-08 ENCOUNTER — Encounter (HOSPITAL_COMMUNITY): Payer: Self-pay | Admitting: *Deleted

## 2018-10-08 ENCOUNTER — Other Ambulatory Visit: Payer: Self-pay

## 2018-10-08 ENCOUNTER — Encounter (HOSPITAL_COMMUNITY)
Admission: RE | Disposition: A | Discharge status: Home / Self Care | Payer: Self-pay | Source: Home / Self Care | Attending: Gastroenterology

## 2018-10-08 DIAGNOSIS — R1013 Epigastric pain: Secondary | ICD-10-CM | POA: Diagnosis not present

## 2018-10-08 DIAGNOSIS — Z7901 Long term (current) use of anticoagulants: Secondary | ICD-10-CM | POA: Diagnosis not present

## 2018-10-08 DIAGNOSIS — C250 Malignant neoplasm of head of pancreas: Secondary | ICD-10-CM | POA: Insufficient documentation

## 2018-10-08 DIAGNOSIS — K5900 Constipation, unspecified: Secondary | ICD-10-CM | POA: Insufficient documentation

## 2018-10-08 DIAGNOSIS — I10 Essential (primary) hypertension: Secondary | ICD-10-CM | POA: Insufficient documentation

## 2018-10-08 DIAGNOSIS — Z794 Long term (current) use of insulin: Secondary | ICD-10-CM | POA: Diagnosis not present

## 2018-10-08 DIAGNOSIS — Z79899 Other long term (current) drug therapy: Secondary | ICD-10-CM | POA: Insufficient documentation

## 2018-10-08 DIAGNOSIS — Z87891 Personal history of nicotine dependence: Secondary | ICD-10-CM | POA: Insufficient documentation

## 2018-10-08 DIAGNOSIS — E119 Type 2 diabetes mellitus without complications: Secondary | ICD-10-CM | POA: Insufficient documentation

## 2018-10-08 DIAGNOSIS — I4891 Unspecified atrial fibrillation: Secondary | ICD-10-CM | POA: Insufficient documentation

## 2018-10-08 DIAGNOSIS — K219 Gastro-esophageal reflux disease without esophagitis: Secondary | ICD-10-CM | POA: Insufficient documentation

## 2018-10-08 DIAGNOSIS — D136 Benign neoplasm of pancreas: Secondary | ICD-10-CM

## 2018-10-08 DIAGNOSIS — J449 Chronic obstructive pulmonary disease, unspecified: Secondary | ICD-10-CM | POA: Insufficient documentation

## 2018-10-08 DIAGNOSIS — K8689 Other specified diseases of pancreas: Secondary | ICD-10-CM

## 2018-10-08 DIAGNOSIS — K831 Obstruction of bile duct: Secondary | ICD-10-CM | POA: Diagnosis not present

## 2018-10-08 HISTORY — PX: FINE NEEDLE ASPIRATION: SHX6590

## 2018-10-08 HISTORY — PX: ESOPHAGOGASTRODUODENOSCOPY (EGD) WITH PROPOFOL: SHX5813

## 2018-10-08 HISTORY — PX: EUS: SHX5427

## 2018-10-08 LAB — GLUCOSE, CAPILLARY
Glucose-Capillary: 127 mg/dL — ABNORMAL HIGH (ref 70–99)
Glucose-Capillary: 97 mg/dL (ref 70–99)

## 2018-10-08 SURGERY — UPPER ENDOSCOPIC ULTRASOUND (EUS) RADIAL
Anesthesia: Monitor Anesthesia Care

## 2018-10-08 MED ORDER — PROPOFOL 500 MG/50ML IV EMUL
INTRAVENOUS | Status: DC | PRN
  Administered 2018-10-08: 100 ug/kg/min via INTRAVENOUS

## 2018-10-08 MED ORDER — SODIUM CHLORIDE 0.9 % IV SOLN: Freq: Once | INTRAVENOUS | Status: DC

## 2018-10-08 MED ORDER — PROPOFOL 10 MG/ML IV BOLUS
INTRAVENOUS | Status: DC | PRN
  Administered 2018-10-08 (×8): 10 mg via INTRAVENOUS

## 2018-10-08 MED ORDER — PROPOFOL 10 MG/ML IV BOLUS
INTRAVENOUS | Status: AC
  Filled 2018-10-08: qty 20

## 2018-10-08 MED ORDER — PROPOFOL 10 MG/ML IV BOLUS
INTRAVENOUS | Status: AC
  Filled 2018-10-08: qty 40

## 2018-10-08 MED ORDER — LACTATED RINGERS IV SOLN
INTRAVENOUS | Status: DC
  Administered 2018-10-08: 1000 mL via INTRAVENOUS
  Administered 2018-10-08: 12:00:00 via INTRAVENOUS

## 2018-10-08 NOTE — Telephone Encounter (Signed)
-----   Message from Milus Banister, MD sent at 10/08/2018  1:22 PM EDT ----- Shanna Cisco, Just completed (repeat) EUS.  Looks like I made the diagnosis this time. See full report in epic.   The mass in the head of the pancreas was NOT WELL VISUALIZED during this exam due to extensive air artifact introduced by the previously placed meal biliary stent.  I was able to view some of the mass however and preliminary cytology report from transduodenal EUS FNA was positive for malignancy (adenocarcinoma).  Await final pathology results.  Limited visualization during this exam precludes local staging however recent CT scan suggests significant vascular involvement (including SMA).      Domanic Matusek, He needs referral to medical oncology for newly diagnosed pancreatic adenocarcinoma, probably not surgically resectable.   Dawn, Can you add him to next GI tumor board as well.     THANK YOU, ALL

## 2018-10-08 NOTE — Anesthesia Postprocedure Evaluation (Signed)
Anesthesia Post Note  Patient: [name] [name]  Procedure(s) Performed: UPPER ENDOSCOPIC ULTRASOUND (EUS) RADIAL (N/A ) FINE NEEDLE ASPIRATION     Patient location during evaluation: PACU Anesthesia Type: MAC Level of consciousness: awake and alert Pain management: pain level controlled Vital Signs Assessment: post-procedure vital signs reviewed and stable Respiratory status: spontaneous breathing, nonlabored ventilation, respiratory function stable and patient connected to nasal cannula oxygen Cardiovascular status: stable and blood pressure returned to baseline Postop Assessment: no apparent nausea or vomiting Anesthetic complications: no    Last Vitals:  Vitals:   10/08/18 1320 10/08/18 1330  BP: 136/71 136/79  Pulse: (!) 56 60  Resp: 17 16  Temp:    SpO2: 96% 99%    Last Pain:  Vitals:   10/08/18 1330  TempSrc:   PainSc: 0-No pain                 Skarleth Delmonico S

## 2018-10-08 NOTE — Transfer of Care (Signed)
Immediate Anesthesia Transfer of Care Note  Patient: [name] [name]  Procedure(s) Performed: UPPER ENDOSCOPIC ULTRASOUND (EUS) RADIAL (N/A ) FINE NEEDLE ASPIRATION  Patient Location: PACU and Endoscopy Unit  Anesthesia Type:MAC  Level of Consciousness: awake, alert , oriented and patient cooperative  Airway & Oxygen Therapy: Patient Spontanous Breathing and Patient connected to nasal cannula oxygen  Post-op Assessment: Report given to RN and Post -op Vital signs reviewed and stable  Post vital signs: Reviewed and stable  Last Vitals:  Vitals Value Taken Time  BP    Temp    Pulse 59 10/08/2018  1:12 PM  Resp    SpO2 96 % 10/08/2018  1:12 PM  Vitals shown include unvalidated device data.  Last Pain:  Vitals:   10/08/18 1039  TempSrc: Oral  PainSc: 0-No pain         Complications: No apparent anesthesia complications

## 2018-10-08 NOTE — Interval H&P Note (Signed)
History and Physical Interval Note:  10/08/2018 10:23 AM  [name] [name]  has presented today for surgery, with the diagnosis of pancreatic mass.  The various methods of treatment have been discussed with the patient and family. After consideration of risks, benefits and other options for treatment, the patient has consented to  Procedure(s): UPPER ENDOSCOPIC ULTRASOUND (EUS) RADIAL (N/A) as a surgical intervention.  The patient's history has been reviewed, patient examined, no change in status, stable for surgery.  I have reviewed the patient's chart and labs.  Questions were answered to the patient's satisfaction.     Milus Banister

## 2018-10-08 NOTE — Discharge Instructions (Signed)
Upper Endoscopy, Adult Upper endoscopy is a procedure to look inside the upper GI (gastrointestinal) tract. The upper GI tract is made up of:  The part of the body that moves food from your mouth to your stomach (esophagus).  The stomach.  The first part of your small intestine (duodenum). This procedure is also called esophagogastroduodenoscopy (EGD) or gastroscopy. In this procedure, your health care provider passes a thin, flexible tube (endoscope) through your mouth and down your esophagus into your stomach. A small camera is attached to the end of the tube. Images from the camera appear on a monitor in the exam room. During this procedure, your health care provider may also remove a small piece of tissue to be sent to a lab and examined under a microscope (biopsy). Your health care provider may do an upper endoscopy to diagnose cancers of the upper GI tract. You may also have this procedure to find the cause of other conditions, such as:  Stomach pain.  Heartburn.  Pain or problems when swallowing.  Nausea and vomiting.  Stomach bleeding.  Stomach ulcers. Tell a health care provider about:  Any allergies you have.  All medicines you are taking, including vitamins, herbs, eye drops, creams, and over-the-counter medicines.  Any problems you or family members have had with anesthetic medicines.  Any blood disorders you have.  Any surgeries you have had.  Any medical conditions you have.  Whether you are pregnant or may be pregnant. What are the risks? Generally, this is a safe procedure. However, problems may occur, including:  Infection.  Bleeding.  Allergic reactions to medicines.  A tear or hole (perforation) in the esophagus, stomach, or duodenum. What happens before the procedure? Staying hydrated Follow instructions from your health care provider about hydration, which may include:  Up to 2 hours before the procedure - you may continue to drink clear  liquids, such as water, clear fruit juice, black coffee, and plain tea.  Eating and drinking restrictions Follow instructions from your health care provider about eating and drinking, which may include:  8 hours before the procedure - stop eating heavy meals or foods, such as meat, fried foods, or fatty foods.  6 hours before the procedure - stop eating light meals or foods, such as toast or cereal.  6 hours before the procedure - stop drinking milk or drinks that contain milk.  2 hours before the procedure - stop drinking clear liquids. Medicines Ask your health care provider about:  Changing or stopping your regular medicines. This is especially important if you are taking diabetes medicines or blood thinners.  Taking medicines such as aspirin and ibuprofen. These medicines can thin your blood. Do not take these medicines unless your health care provider tells you to take them.  Taking over-the-counter medicines, vitamins, herbs, and supplements. General instructions  Plan to have someone take you home from the hospital or clinic.  If you will be going home right after the procedure, plan to have someone with you for 24 hours.  Ask your health care provider what steps will be taken to help prevent infection. What happens during the procedure?   An IV will be inserted into one of your veins.  You may be given one or more of the following: ? A medicine to help you relax (sedative). ? A medicine to numb the throat (local anesthetic).  You will lie on your left side on an exam table.  Your health care provider will pass the endoscope through  your mouth and down your esophagus. °· Your health care provider will use the scope to check the inside of your esophagus, stomach, and duodenum. Biopsies may be taken. °· The endoscope will be removed. °The procedure may vary among health care providers and hospitals. °What happens after the procedure? °· Your blood pressure, heart rate,  breathing rate, and blood oxygen level will be monitored until you leave the hospital or clinic. °· Do not drive for 24 hours if you were given a sedative during your procedure. °· When your throat is no longer numb, you may be given some fluids to drink. °· It is up to you to get the results of your procedure. Ask your health care provider, or the department that is doing the procedure, when your results will be ready. °Summary °· Upper endoscopy is a procedure to look inside the upper GI tract. °· During the procedure, an IV will be inserted into one of your veins. You may be given a medicine to help you relax. °· A medicine will be used to numb your throat. °· The endoscope will be passed through your mouth and down your esophagus. °This information is not intended to replace advice given to you by your health care provider. Make sure you discuss any questions you have with your health care provider. °Document Released: 07/12/2000 Document Revised: 12/15/2017 Document Reviewed: 12/15/2017 °Elsevier Interactive Patient Education © 2019 [employer] ° °

## 2018-10-08 NOTE — Anesthesia Preprocedure Evaluation (Addendum)
Anesthesia Evaluation  Patient identified by MRN, date of birth, ID band Patient awake    Reviewed: Allergy & Precautions, NPO status , Patient's Chart, lab work & pertinent test results  Airway Mallampati: II  TM Distance: >3 FB Neck ROM: Full    Dental no notable dental hx.    Pulmonary COPD, former smoker,    Pulmonary exam normal breath sounds clear to auscultation       Cardiovascular hypertension, Normal cardiovascular exam+ dysrhythmias  Rhythm:Regular Rate:Normal     Neuro/Psych TIAnegative psych ROS   GI/Hepatic Neg liver ROS, GERD  ,30 pound weight loss over 1.5 months   Endo/Other  diabetes, Insulin Dependent  Renal/GU negative Renal ROS  negative genitourinary   Musculoskeletal negative musculoskeletal ROS (+)   Abdominal   Peds negative pediatric ROS (+)  Hematology negative hematology ROS (+)   Anesthesia Other Findings   Reproductive/Obstetrics negative OB ROS                            Anesthesia Physical Anesthesia Plan  ASA: III  Anesthesia Plan: MAC   Post-op Pain Management:    Induction: Intravenous  PONV Risk Score and Plan:   Airway Management Planned: Simple Face Mask  Additional Equipment:   Intra-op Plan:   Post-operative Plan:   Informed Consent: I have reviewed the patients History and Physical, chart, labs and discussed the procedure including the risks, benefits and alternatives for the proposed anesthesia with the patient or authorized representative who has indicated his/her understanding and acceptance.     Dental advisory given  Plan Discussed with: CRNA and Surgeon  Anesthesia Plan Comments:         Anesthesia Quick Evaluation

## 2018-10-08 NOTE — Op Note (Signed)
[facility] Patient Name: [name] [name] Procedure Date: 10/08/2018 MRN: [id number] Attending MD: Milus Banister , MD Date of Birth: Mar 30, 1951 CSN: [id number] Age: 68 Admit Type: Outpatient Procedure:                Upper EUS Indications:              Mass in head of pancreas. 07/2018 ERCP Dr. Fuller Plan                            with placement of plastic biliary stent; biliary                            stricture brushing showed atypical cells. ERCP/EUS                            Dr. Ardis Hughs; plastic stent had migrated, bili                            elevated, fully covered 6cm long SEMS placed; FNA                            of mass; atypical cells again on biliary brushing                            and also EUS FNA Providers:                Milus Banister, MD, Cleda Daub, RN, Elspeth Cho Tech., Technician, Karis Juba, CRNA Referring MD:              Medicines:                Monitored Anesthesia Care Complications:            No immediate complications. Estimated blood loss:                            None. Estimated Blood Loss:     Estimated blood loss: none. Procedure:                Pre-Anesthesia Assessment:                           - Prior to the procedure, a History and Physical                            was performed, and patient medications and                            allergies were reviewed. The patient's tolerance of                            previous anesthesia was also reviewed. The risks  and benefits of the procedure and the sedation                            options and risks were discussed with the patient.                            All questions were answered, and informed consent                            was obtained. Prior Anticoagulants: The patient has                            taken Eliquis (apixaban), last dose was 6 days                            prior to  procedure. ASA Grade Assessment: III - A                            patient with severe systemic disease. After                            reviewing the risks and benefits, the patient was                            deemed in satisfactory condition to undergo the                            procedure.                           After obtaining informed consent, the endoscope was                            passed under direct vision. Throughout the                            procedure, the patient's blood pressure, pulse, and                            oxygen saturations were monitored continuously. The                            [id number]-[id number]-[id number] ([id number]) Olympus Radial EUS was                            introduced through the mouth, and advanced to the                            second part of duodenum. The [id number] ([id number])                            Olympus Linear EUS was introduced through the  mouth, and advanced to the second part of duodenum.                            The upper EUS was accomplished without difficulty.                            The patient tolerated the procedure well. Scope In: Scope Out: Findings:      Endoscopic findings (limited views with radial and linear       echoendoscopes): :      1. Normal UGI tract except for previously placed SEMS extending into the       duodenum across the major papilla.      EUS findings:      1. The previously placed SEMS introduced significant air artifact and so       I was not able to adequately evaluate the pancreatic head mass. There       was irregular pancreatic parenchyma adjacent to the metal biliary stent       which I suspected to be a small portion of the 5cm mass described on the       recent CT scan and I sampled it using 6 transduodenal passes with a 25       guage EUS FNA needle. Preliminary cytology review was positive for       malignancy (adenocarcinoma). Impression:               - The  mass in the head of the pancreas was NOT WELL                            VISUALIZED during this exam due to extensive air                            artifact introduced by the previously placed meal                            biliary stent. I was able to view some of the mass                            however and preliminary cytology report from                            transduodenal EUS FNA was positive for malignancy                            (adenocarcinoma). Await final pathology results.                            Limited visualization during this exam precludes                            local staging however recent CT scan suggests                            significant vascular involvement (including SMA). Moderate Sedation:      Not Applicable - Patient had care per Anesthesia.  Recommendation:           - Discharge patient to home (ambulatory).                           - Await cytology results.                           - [facility] will begin referral process to medical                            oncology. Procedure Code(s):        --- Professional ---                           [phone], Esophagogastroduodenoscopy, flexible,                            transoral; with transendoscopic ultrasound-guided                            intramural or transmural fine needle                            aspiration/biopsy(s), (includes endoscopic                            ultrasound examination limited to the esophagus,                            stomach or duodenum, and adjacent structures) Diagnosis Code(s):        --- Professional ---                           R93.3, Abnormal findings on diagnostic imaging of                            other parts of digestive tract CPT copyright 2018 [employer]. All rights reserved. The codes documented in this report are preliminary and upon coder review may  be revised to meet current compliance requirements. Milus Banister,  MD 10/08/2018 1:22:21 PM This report has been signed electronically. Number of Addenda: 0

## 2018-10-09 ENCOUNTER — Encounter (HOSPITAL_COMMUNITY): Payer: Self-pay | Admitting: Gastroenterology

## 2018-10-12 ENCOUNTER — Telehealth: Payer: Self-pay

## 2018-10-12 NOTE — Progress Notes (Signed)
Called and left detailed message on patient's VM with T/D/L of initial consult on 10/13/18 @ 8 AM. Requested patient arrive at 7:40 to register. Also spoke directly to patient's daughter who will make sure patient receives message.

## 2018-10-12 NOTE — Telephone Encounter (Signed)
-----   Message from Ladene Artist, MD sent at 10/11/2018  7:52 PM EDT ----- Regarding: RE: Good to have confirmation however unfortunate. ----- Message ----- From: Milus Banister, MD Sent: 10/08/2018   1:22 PM EDT To: Ladene Artist, MD, Timothy Lasso, RN, #  Vito and Norberto Sorenson, Just completed (repeat) EUS.  Looks like I made the diagnosis this time. See full report in epic.   The mass in the head of the pancreas was NOT WELL VISUALIZED during this exam due to extensive air artifact introduced by the previously placed meal biliary stent.  I was able to view some of the mass however and preliminary cytology report from transduodenal EUS FNA was positive for malignancy (adenocarcinoma).  Await final pathology results.  Limited visualization during this exam precludes local staging however recent CT scan suggests significant vascular involvement (including SMA).      Belva Koziel, He needs referral to medical oncology for newly diagnosed pancreatic adenocarcinoma, probably not surgically resectable.   Dawn, Can you add him to next GI tumor board as well.     THANK YOU, ALL

## 2018-10-12 NOTE — Telephone Encounter (Signed)
Referral was made to med oncology.

## 2018-10-13 ENCOUNTER — Telehealth: Payer: Self-pay | Admitting: *Deleted

## 2018-10-13 ENCOUNTER — Ambulatory Visit: Payer: Medicare Other | Admitting: Oncology

## 2018-10-13 ENCOUNTER — Telehealth: Payer: Self-pay

## 2018-10-13 NOTE — Telephone Encounter (Signed)
Patient was a No Show for appointment this morning with Dr. Benay Spice. I left message on patient's VM requesting that he call back to discuss need for med/onc referral and to reschedule. I left my firect phone number on VM.

## 2018-10-13 NOTE — Telephone Encounter (Signed)
Daughter called to follow up on her dad's appointment. Did he come? Forwarded message to [employer].

## 2018-10-13 NOTE — Telephone Encounter (Signed)
Patient returned my voice mail. Stated that his phone is not working and he is calling from a neighbor's house. He received my message yesterday about new patient appointment but unable to call this morning. Patient stated that he would like to communicate by e-mail. Sent e-mail asking him if he could be available at short notice to come in for apt with Dr. Benay Spice. Have not received a response.  Daughter called [facility] asking if her father made his appointment today. I called her back to let her know of no show.

## 2018-10-14 ENCOUNTER — Telehealth: Payer: Self-pay

## 2018-10-14 ENCOUNTER — Telehealth: Payer: Self-pay | Admitting: Gastroenterology

## 2018-10-14 NOTE — Telephone Encounter (Signed)
Called patient and left VM requesting that he call back to reschedule missed initial med/onc consult. No response from sent e-mail on 10/13/18.

## 2018-10-14 NOTE — Telephone Encounter (Signed)
I spoke with [name] [name] this afternoon regarding his recent EUS with FNA demonstrating pancreatic Adenocarcinoma.  The results had already been conveyed to him by Dr. Ardis Hughs as well.  He was scheduled for an appointment with medical oncology yesterday, but unfortunately he missed the appointment.  He is coordinating to reschedule this appointment.  All questions answered and he was appreciative of the phone update.  To follow-up in the GI clinic PRN.

## 2018-10-14 NOTE — Telephone Encounter (Signed)
Spoke with patient to advise of rescheduled initial consult on 10/22/18 @ 2 PM with Dr. Benay Spice. Patient aware to arrive 30 minutes early.

## 2018-10-19 ENCOUNTER — Other Ambulatory Visit: Payer: Self-pay

## 2018-10-19 ENCOUNTER — Observation Stay (HOSPITAL_COMMUNITY)
Admission: EM | Admit: 2018-10-19 | Discharge: 2018-10-20 | Disposition: A | Discharge status: Home / Self Care | Payer: Medicare Other | Attending: Internal Medicine | Admitting: Internal Medicine

## 2018-10-19 ENCOUNTER — Emergency Department (HOSPITAL_COMMUNITY): Payer: Medicare Other

## 2018-10-19 ENCOUNTER — Encounter (HOSPITAL_COMMUNITY): Payer: Self-pay

## 2018-10-19 DIAGNOSIS — I129 Hypertensive chronic kidney disease with stage 1 through stage 4 chronic kidney disease, or unspecified chronic kidney disease: Secondary | ICD-10-CM | POA: Diagnosis not present

## 2018-10-19 DIAGNOSIS — Z87891 Personal history of nicotine dependence: Secondary | ICD-10-CM | POA: Diagnosis not present

## 2018-10-19 DIAGNOSIS — Z833 Family history of diabetes mellitus: Secondary | ICD-10-CM | POA: Diagnosis not present

## 2018-10-19 DIAGNOSIS — R1030 Lower abdominal pain, unspecified: Secondary | ICD-10-CM | POA: Diagnosis not present

## 2018-10-19 DIAGNOSIS — Z808 Family history of malignant neoplasm of other organs or systems: Secondary | ICD-10-CM | POA: Insufficient documentation

## 2018-10-19 DIAGNOSIS — Z794 Long term (current) use of insulin: Secondary | ICD-10-CM | POA: Insufficient documentation

## 2018-10-19 DIAGNOSIS — I10 Essential (primary) hypertension: Secondary | ICD-10-CM | POA: Diagnosis present

## 2018-10-19 DIAGNOSIS — I480 Paroxysmal atrial fibrillation: Secondary | ICD-10-CM | POA: Insufficient documentation

## 2018-10-19 DIAGNOSIS — R339 Retention of urine, unspecified: Secondary | ICD-10-CM | POA: Diagnosis present

## 2018-10-19 DIAGNOSIS — N179 Acute kidney failure, unspecified: Secondary | ICD-10-CM | POA: Diagnosis not present

## 2018-10-19 DIAGNOSIS — C250 Malignant neoplasm of head of pancreas: Secondary | ICD-10-CM | POA: Diagnosis not present

## 2018-10-19 DIAGNOSIS — C259 Malignant neoplasm of pancreas, unspecified: Secondary | ICD-10-CM | POA: Diagnosis present

## 2018-10-19 DIAGNOSIS — I429 Cardiomyopathy, unspecified: Secondary | ICD-10-CM | POA: Diagnosis not present

## 2018-10-19 DIAGNOSIS — Z8249 Family history of ischemic heart disease and other diseases of the circulatory system: Secondary | ICD-10-CM | POA: Diagnosis not present

## 2018-10-19 DIAGNOSIS — Z800 Family history of malignant neoplasm of digestive organs: Secondary | ICD-10-CM | POA: Diagnosis not present

## 2018-10-19 DIAGNOSIS — Z880 Allergy status to penicillin: Secondary | ICD-10-CM | POA: Diagnosis not present

## 2018-10-19 DIAGNOSIS — R1084 Generalized abdominal pain: Secondary | ICD-10-CM | POA: Diagnosis not present

## 2018-10-19 DIAGNOSIS — Z888 Allergy status to other drugs, medicaments and biological substances status: Secondary | ICD-10-CM | POA: Insufficient documentation

## 2018-10-19 DIAGNOSIS — N183 Chronic kidney disease, stage 3 (moderate): Secondary | ICD-10-CM | POA: Diagnosis not present

## 2018-10-19 DIAGNOSIS — K219 Gastro-esophageal reflux disease without esophagitis: Secondary | ICD-10-CM | POA: Insufficient documentation

## 2018-10-19 DIAGNOSIS — Z803 Family history of malignant neoplasm of breast: Secondary | ICD-10-CM | POA: Insufficient documentation

## 2018-10-19 DIAGNOSIS — E785 Hyperlipidemia, unspecified: Secondary | ICD-10-CM | POA: Diagnosis not present

## 2018-10-19 DIAGNOSIS — K5900 Constipation, unspecified: Secondary | ICD-10-CM | POA: Insufficient documentation

## 2018-10-19 DIAGNOSIS — Z79899 Other long term (current) drug therapy: Secondary | ICD-10-CM | POA: Insufficient documentation

## 2018-10-19 DIAGNOSIS — J449 Chronic obstructive pulmonary disease, unspecified: Secondary | ICD-10-CM | POA: Insufficient documentation

## 2018-10-19 DIAGNOSIS — E1122 Type 2 diabetes mellitus with diabetic chronic kidney disease: Secondary | ICD-10-CM | POA: Diagnosis not present

## 2018-10-19 DIAGNOSIS — N401 Enlarged prostate with lower urinary tract symptoms: Principal | ICD-10-CM

## 2018-10-19 DIAGNOSIS — E1121 Type 2 diabetes mellitus with diabetic nephropathy: Secondary | ICD-10-CM

## 2018-10-19 DIAGNOSIS — Z7901 Long term (current) use of anticoagulants: Secondary | ICD-10-CM | POA: Insufficient documentation

## 2018-10-19 DIAGNOSIS — R338 Other retention of urine: Secondary | ICD-10-CM | POA: Diagnosis not present

## 2018-10-19 DIAGNOSIS — Z8042 Family history of malignant neoplasm of prostate: Secondary | ICD-10-CM | POA: Diagnosis not present

## 2018-10-19 DIAGNOSIS — E860 Dehydration: Secondary | ICD-10-CM | POA: Diagnosis not present

## 2018-10-19 DIAGNOSIS — I4891 Unspecified atrial fibrillation: Secondary | ICD-10-CM | POA: Diagnosis present

## 2018-10-19 DIAGNOSIS — R351 Nocturia: Secondary | ICD-10-CM

## 2018-10-19 DIAGNOSIS — N189 Chronic kidney disease, unspecified: Secondary | ICD-10-CM

## 2018-10-19 HISTORY — DX: Malignant (primary) neoplasm, unspecified: C80.1

## 2018-10-19 LAB — COMPREHENSIVE METABOLIC PANEL
ALT: 17 U/L (ref 0–44)
AST: 27 U/L (ref 15–41)
Albumin: 4.3 g/dL (ref 3.5–5.0)
Alkaline Phosphatase: 84 U/L (ref 38–126)
Anion gap: 17 — ABNORMAL HIGH (ref 5–15)
BUN: 39 mg/dL — ABNORMAL HIGH (ref 8–23)
CO2: 25 mmol/L (ref 22–32)
Calcium: 9.7 mg/dL (ref 8.9–10.3)
Chloride: 94 mmol/L — ABNORMAL LOW (ref 98–111)
Creatinine, Ser: 2.55 mg/dL — ABNORMAL HIGH (ref 0.61–1.24)
GFR calc non Af Amer: 25 mL/min — ABNORMAL LOW (ref >60)
GFR, EST AFRICAN AMERICAN: 29 mL/min — AB (ref >60)
Glucose, Bld: 141 mg/dL — ABNORMAL HIGH (ref 70–99)
Potassium: 3.6 mmol/L (ref 3.5–5.1)
SODIUM: 136 mmol/L (ref 135–145)
Total Bilirubin: 2.3 mg/dL — ABNORMAL HIGH (ref 0.3–1.2)
Total Protein: 8.4 g/dL — ABNORMAL HIGH (ref 6.5–8.1)

## 2018-10-19 LAB — GLUCOSE, CAPILLARY
GLUCOSE-CAPILLARY: 116 mg/dL — AB (ref 70–99)
Glucose-Capillary: 95 mg/dL (ref 70–99)

## 2018-10-19 LAB — URINALYSIS, ROUTINE W REFLEX MICROSCOPIC
BILIRUBIN URINE: NEGATIVE
Glucose, UA: NEGATIVE mg/dL
Hgb urine dipstick: NEGATIVE
Ketones, ur: 5 mg/dL — AB
Leukocytes,Ua: NEGATIVE
NITRITE: NEGATIVE
Protein, ur: NEGATIVE mg/dL
Specific Gravity, Urine: 1.014 (ref 1.005–1.030)
pH: 6 (ref 5.0–8.0)

## 2018-10-19 LAB — CBC WITH DIFFERENTIAL/PLATELET
Abs Immature Granulocytes: 0.02 10*3/uL (ref 0.00–0.07)
BASOS PCT: 1 %
Basophils Absolute: 0 10*3/uL (ref 0.0–0.1)
Eosinophils Absolute: 0.1 10*3/uL (ref 0.0–0.5)
Eosinophils Relative: 1 %
HCT: 39.3 % (ref 39.0–52.0)
Hemoglobin: 12.6 g/dL — ABNORMAL LOW (ref 13.0–17.0)
Immature Granulocytes: 0 %
Lymphocytes Relative: 16 %
Lymphs Abs: 1 10*3/uL (ref 0.7–4.0)
MCH: 31 pg (ref 26.0–34.0)
MCHC: 32.1 g/dL (ref 30.0–36.0)
MCV: 96.8 fL (ref 80.0–100.0)
Monocytes Absolute: 0.5 10*3/uL (ref 0.1–1.0)
Monocytes Relative: 7 %
Neutro Abs: 4.9 10*3/uL (ref 1.7–7.7)
Neutrophils Relative %: 75 %
PLATELETS: 458 10*3/uL — AB (ref 150–400)
RBC: 4.06 MIL/uL — AB (ref 4.22–5.81)
RDW: 12.6 % (ref 11.5–15.5)
WBC: 6.5 10*3/uL (ref 4.0–10.5)
nRBC: 0 % (ref 0.0–0.2)

## 2018-10-19 LAB — LIPASE, BLOOD: Lipase: 73 U/L — ABNORMAL HIGH (ref 11–51)

## 2018-10-19 MED ORDER — SODIUM CHLORIDE 0.9 % IV BOLUS
1000.0000 mL | Freq: Once | INTRAVENOUS | Status: AC
  Administered 2018-10-19: 1000 mL via INTRAVENOUS

## 2018-10-19 MED ORDER — IOHEXOL 300 MG/ML  SOLN
30.0000 mL | Freq: Once | INTRAMUSCULAR | Status: AC | PRN
  Administered 2018-10-19: 30 mL via ORAL

## 2018-10-19 MED ORDER — APIXABAN 5 MG PO TABS
5.0000 mg | ORAL_TABLET | Freq: Two times a day (BID) | ORAL | Status: DC
  Administered 2018-10-20: 5 mg via ORAL
  Filled 2018-10-19: qty 1

## 2018-10-19 MED ORDER — ADULT MULTIVITAMIN W/MINERALS CH
1.0000 | ORAL_TABLET | Freq: Every day | ORAL | Status: DC
  Administered 2018-10-20: 1 via ORAL
  Filled 2018-10-19: qty 1

## 2018-10-19 MED ORDER — APIXABAN 5 MG PO TABS
5.0000 mg | ORAL_TABLET | Freq: Once | ORAL | Status: AC
  Administered 2018-10-19: 5 mg via ORAL
  Filled 2018-10-19: qty 1

## 2018-10-19 MED ORDER — ACETAMINOPHEN 500 MG PO TABS: 1000.0000 mg | ORAL_TABLET | Freq: Four times a day (QID) | ORAL | Status: DC | PRN

## 2018-10-19 MED ORDER — LOSARTAN POTASSIUM 50 MG PO TABS
100.0000 mg | ORAL_TABLET | Freq: Every day | ORAL | Status: DC
  Administered 2018-10-20: 100 mg via ORAL
  Filled 2018-10-19: qty 2

## 2018-10-19 MED ORDER — NALOXEGOL OXALATE 25 MG PO TABS
25.0000 mg | ORAL_TABLET | Freq: Every day | ORAL | Status: DC
  Administered 2018-10-20: 25 mg via ORAL
  Filled 2018-10-19: qty 1

## 2018-10-19 MED ORDER — MORPHINE SULFATE (PF) 4 MG/ML IV SOLN
4.0000 mg | Freq: Once | INTRAVENOUS | Status: AC
  Administered 2018-10-19: 4 mg via INTRAVENOUS
  Filled 2018-10-19: qty 1

## 2018-10-19 MED ORDER — METOPROLOL SUCCINATE ER 50 MG PO TB24
50.0000 mg | ORAL_TABLET | Freq: Every day | ORAL | Status: DC
  Administered 2018-10-19 – 2018-10-20 (×2): 50 mg via ORAL
  Filled 2018-10-19 (×2): qty 1

## 2018-10-19 MED ORDER — INSULIN DETEMIR 100 UNIT/ML ~~LOC~~ SOLN
15.0000 [IU] | Freq: Every day | SUBCUTANEOUS | Status: DC
  Filled 2018-10-19 (×2): qty 0.15

## 2018-10-19 MED ORDER — ALUM & MAG HYDROXIDE-SIMETH 200-200-20 MG/5ML PO SUSP: 30.0000 mL | Freq: Four times a day (QID) | ORAL | Status: DC | PRN

## 2018-10-19 MED ORDER — INSULIN ASPART 100 UNIT/ML ~~LOC~~ SOLN
8.0000 [IU] | Freq: Three times a day (TID) | SUBCUTANEOUS | Status: DC
  Administered 2018-10-20: 8 [IU] via SUBCUTANEOUS

## 2018-10-19 MED ORDER — PANTOPRAZOLE SODIUM 40 MG PO TBEC
40.0000 mg | DELAYED_RELEASE_TABLET | Freq: Every day | ORAL | Status: DC
  Administered 2018-10-19 – 2018-10-20 (×2): 40 mg via ORAL
  Filled 2018-10-19 (×2): qty 1

## 2018-10-19 MED ORDER — TAMSULOSIN HCL 0.4 MG PO CAPS
0.4000 mg | ORAL_CAPSULE | Freq: Every day | ORAL | Status: DC
  Administered 2018-10-19: 0.4 mg via ORAL
  Filled 2018-10-19: qty 1

## 2018-10-19 MED ORDER — AMLODIPINE BESYLATE 5 MG PO TABS
5.0000 mg | ORAL_TABLET | Freq: Every day | ORAL | Status: DC
  Administered 2018-10-20: 5 mg via ORAL
  Filled 2018-10-19: qty 1

## 2018-10-19 MED ORDER — OMEGA-3-ACID ETHYL ESTERS 1 G PO CAPS
2.0000 g | ORAL_CAPSULE | Freq: Every day | ORAL | Status: DC
  Filled 2018-10-19: qty 2

## 2018-10-19 MED ORDER — ENSURE ENLIVE PO LIQD
237.0000 mL | Freq: Two times a day (BID) | ORAL | Status: DC
  Administered 2018-10-20: 237 mL via ORAL

## 2018-10-19 MED ORDER — ALBUTEROL SULFATE (2.5 MG/3ML) 0.083% IN NEBU: 3.0000 mL | INHALATION_SOLUTION | Freq: Four times a day (QID) | RESPIRATORY_TRACT | Status: DC | PRN

## 2018-10-19 NOTE — ED Notes (Signed)
ED TO INPATIENT HANDOFF REPORT  ED Nurse Name and Phone #: Arlen Legendre RN  S Name/Age/Gender [name] [name] 68 y.o. male Room/Bed: WA24/WA24  Code Status   Code Status: Prior  Home/SNF/Other Home Patient oriented to: self, place, time and situation Is this baseline? Yes   Triage Complete: Triage complete  Chief Complaint cancer, Lower left abd pain, groin pain  Triage Note Per [facility]- Pt resides at home. Pt recently DX with pancreatic Ca. Pt endorse stomach, groin pain. Denies N/V/D and fever. Pt also endorse urinary rentention and constipation sudden onset this AM.    Allergies Allergies  Allergen Reactions  . Hydralazine Shortness Of Breath    Heart palpitations  . Lisinopril Cough  . Penicillins Other (See Comments)    [place] DID THE REACTION INVOLVE: Swelling of the face/tongue/throat, SOB, or low BP? No Yes Sudden or severe rash/hives, skin peeling, or the inside of the mouth or nose? No Did it require medical treatment? No When did it last happen?teens  If all above answers are "NO", may proceed with cephalosporin use.  Marland Kitchen Tribenzor [Olmesartan-Amlodipine-Hctz] Nausea Only    dizziness    Level of Care/Admitting Diagnosis ED Disposition    ED Disposition Condition Comment   Admit  Hospital Area: [facility] [[id number]]  Level of Care: Med-Surg [16]  Diagnosis: Urinary retention [[id number]]  Admitting Physician: Barb Merino [[id number]]  Attending Physician: Barb Merino [[id number]]  PT Class (Do Not Modify): Observation [104]  PT Acc Code (Do Not Modify): Observation [10022]       B Medical/Surgery History Past Medical History:  Diagnosis Date  . Allergy   . Asthma   . Atrial fibrillation ([place])    a. initially occuring in 2014, has declined anticoagulation since having a GI bleed with initiation of Xarelto  . Cancer ([place])   . Cardiomyopathy 2011   a. Patient was told that he had an ejection fraction of 37% by echo done elsewhere in  2011 b. 06/2014: echo showing preserved EF of 55-60%, severe asymetric septal hypertrophy with no SAM or LVOT gradient  . COPD (chronic obstructive pulmonary disease) ([place])   . Diabetes mellitus without complication ([place])   . Diverticulitis   . GERD (gastroesophageal reflux disease)   . History of colon polyps   . Hypertension   . Tobacco abuse    Past Surgical History:  Procedure Laterality Date  . BILIARY STENT PLACEMENT  07/30/2018   Procedure: BILIARY STENT PLACEMENT;  Surgeon: Ladene Artist, MD;  Location: [facility] END[facility]COPY;  Service: Endoscopy;;  . BILIARY STENT PLACEMENT N/A 08/13/2018   Procedure: BILIARY STENT PLACEMENT;  Surgeon: Milus Banister, MD;  Location: [facility] ENDOSCOPY;  Service: Endoscopy;  Laterality: N/A;  . COLON SURGERY  2009  . COLONOSCOPY    . ERCP    . ERCP N/A 07/30/2018   Procedure: ENDOSCOPIC RETROGRADE CHOLANGIOPANCREATOGRAPHY (ERCP);  Surgeon: Ladene Artist, MD;  Location: [facility] ENDOSCOPY;  Service: Endoscopy;  Laterality: N/A;  . ERCP N/A 08/13/2018   Procedure: ENDOSCOPIC RETROGRADE CHOLANGIOPANCREATOGRAPHY (ERCP);  Surgeon: Milus Banister, MD;  Location: [facility] ENDOSCOPY;  Service: Endoscopy;  Laterality: N/A;  . ESOPHAGOGASTRODUODENOSCOPY N/A 08/13/2018   Procedure: ESOPHAGOGASTRODUODENOSCOPY (EGD);  Surgeon: Milus Banister, MD;  Location: [facility] ENDOSCOPY;  Service: Endoscopy;  Laterality: N/A;  . ESOPHAGOGASTRODUODENOSCOPY (EGD) WITH PROPOFOL N/A 10/08/2018   Procedure: ESOPHAGOGASTRODUODENOSCOPY (EGD) WITH PROPOFOL;  Surgeon: Milus Banister, MD;  Location: [facility] ENDOSCOPY;  Service: Endoscopy;  Laterality: N/A;  . EUS N/A 08/13/2018  Procedure: UPPER ENDOSCOPIC ULTRASOUND (EUS) RADIAL;  Surgeon: Milus Banister, MD;  Location: [facility] ENDOSCOPY;  Service: Endoscopy;  Laterality: N/A;  . EUS N/A 10/08/2018   Procedure: UPPER ENDOSCOPIC ULTRASOUND (EUS) RADIAL;  Surgeon: Milus Banister, MD;  Location: [facility] ENDOSCOPY;  Service: Endoscopy;  Laterality: N/A;  . FINE NEEDLE  ASPIRATION N/A 08/13/2018   Procedure: FINE NEEDLE ASPIRATION (FNA) LINEAR;  Surgeon: Milus Banister, MD;  Location: [facility] ENDOSCOPY;  Service: Endoscopy;  Laterality: N/A;  . FINE NEEDLE ASPIRATION  10/08/2018   Procedure: FINE NEEDLE ASPIRATION;  Surgeon: Milus Banister, MD;  Location: [facility] ENDOSCOPY;  Service: Endoscopy;;  . Lavell Islam REMOVAL  08/13/2018   Procedure: STENT REMOVAL;  Surgeon: Milus Banister, MD;  Location: [facility] ENDOSCOPY;  Service: Endoscopy;;     A IV Location/Drains/Wounds Patient Lines/Drains/Airways Status   Active Line/Drains/Airways    Name:   Placement date:   Placement time:   Site:   Days:   Peripheral IV 10/19/18 Right Forearm   10/19/18    1117    Forearm   less than 1   Urethral Catheter JS   10/19/18    1135    -   less than 1   GI Stent 10 Fr.   08/13/18    1310    -   67          Intake/Output Last 24 hours No intake or output data in the 24 hours ending 10/19/18 1653  Labs/Imaging Results for orders placed or performed during the hospital encounter of 10/19/18 (from the past 48 hour(s))  CBC with Differential     Status: Abnormal   Collection Time: 10/19/18 11:20 AM  Result Value Ref Range   WBC 6.5 4.0 - 10.5 K/uL   RBC 4.06 (L) 4.22 - 5.81 MIL/uL   Hemoglobin 12.6 (L) 13.0 - 17.0 g/dL   HCT 39.3 39.0 - 52.0 %   MCV 96.8 80.0 - 100.0 fL   MCH 31.0 26.0 - 34.0 pg   MCHC 32.1 30.0 - 36.0 g/dL   RDW 12.6 11.5 - 15.5 %   Platelets 458 (H) 150 - 400 K/uL   nRBC 0.0 0.0 - 0.2 %   Neutrophils Relative % 75 %   Neutro Abs 4.9 1.7 - 7.7 K/uL   Lymphocytes Relative 16 %   Lymphs Abs 1.0 0.7 - 4.0 K/uL   Monocytes Relative 7 %   Monocytes Absolute 0.5 0.1 - 1.0 K/uL   Eosinophils Relative 1 %   Eosinophils Absolute 0.1 0.0 - 0.5 K/uL   Basophils Relative 1 %   Basophils Absolute 0.0 0.0 - 0.1 K/uL   Immature Granulocytes 0 %   Abs Immature Granulocytes 0.02 0.00 - 0.07 K/uL    Comment: Performed at [facility], [place] [address] [place], [place] [address]  Comprehensive metabolic panel     Status: Abnormal   Collection Time: 10/19/18 11:20 AM  Result Value Ref Range   Sodium 136 135 - 145 mmol/L   Potassium 3.6 3.5 - 5.1 mmol/L   Chloride 94 (L) 98 - 111 mmol/L   CO2 25 22 - 32 mmol/L   Glucose, Bld 141 (H) 70 - 99 mg/dL   BUN 39 (H) 8 - 23 mg/dL   Creatinine, Ser 2.55 (H) 0.61 - 1.24 mg/dL   Calcium 9.7 8.9 - 10.3 mg/dL   Total Protein 8.4 (H) 6.5 - 8.1 g/dL   Albumin 4.3 3.5 - 5.0 g/dL  AST 27 15 - 41 U/L   ALT 17 0 - 44 U/L   Alkaline Phosphatase 84 38 - 126 U/L   Total Bilirubin 2.3 (H) 0.3 - 1.2 mg/dL   GFR calc non Af Amer 25 (L) >60 mL/min   GFR calc Af Amer 29 (L) >60 mL/min   Anion gap 17 (H) 5 - 15    Comment: Performed at [facility], [place] [address], [place], [place] [address]  Urinalysis, Routine w reflex microscopic     Status: Abnormal   Collection Time: 10/19/18 11:20 AM  Result Value Ref Range   Color, Urine YELLOW YELLOW   APPearance CLEAR CLEAR   Specific Gravity, Urine 1.014 1.005 - 1.030   pH 6.0 5.0 - 8.0   Glucose, UA NEGATIVE NEGATIVE mg/dL   Hgb urine dipstick NEGATIVE NEGATIVE   Bilirubin Urine NEGATIVE NEGATIVE   Ketones, ur 5 (A) NEGATIVE mg/dL   Protein, ur NEGATIVE NEGATIVE mg/dL   Nitrite NEGATIVE NEGATIVE   Leukocytes,Ua NEGATIVE NEGATIVE    Comment: Performed at [place] [address] [place], [place] [address]  Lipase, blood     Status: Abnormal   Collection Time: 10/19/18 11:20 AM  Result Value Ref Range   Lipase 73 (H) 11 - 51 U/L    Comment: Performed at [facility], [place] [address] [place], [place] [address]   Ct Abdomen Pelvis Wo Contrast  Result Date: 10/19/2018 CLINICAL DATA:  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 CBD stent placed January 2020. Abdominal pain. EXAM: CT ABDOMEN AND PELVIS WITHOUT CONTRAST TECHNIQUE: Multidetector CT imaging of the abdomen and pelvis was  performed following the standard protocol without IV contrast. COMPARISON:  09/10/2018 CT abdomen. 07/28/2018 MRI abdomen. 07/27/2018 unenhanced CT abdomen/pelvis. FINDINGS: Lower chest: No significant pulmonary nodules or acute consolidative airspace disease. Coronary atherosclerosis. Hepatobiliary: Normal liver size. No liver mass. Pneumobilia throughout the mildly dilated left liver lobe intrahepatic bile ducts, unchanged. Pneumobilia in the nondistended gallbladder with no radiopaque cholelithiasis. No gallbladder wall thickening or pericholecystic fluid. Reflux of oral contrast into right liver lobe bile ducts. Well-positioned CBD stent with pneumobilia in CBD. Distal CBD stent tip in the descending duodenal lumen. Pancreas: Poorly marginated pancreatic head 5.6 x 3.0 cm mass (series 2/image 31), previously 5.7 x 3.2 cm on 09/10/2018 CT, not appreciably changed. Stable mild pancreatic duct dilation in the pancreatic body and tail. Progression of pancreatic parenchymal atrophy in the pancreatic body and tail. Spleen: Atrophic spleen.  No splenic mass. Adrenals/Urinary Tract: Normal adrenals. No renal stones. No hydronephrosis. Small simple bilateral renal cysts, largest 1.8 cm in the medial upper right kidney. No new contour deforming renal lesions. Bladder collapsed by indwelling Foley catheter. Expected gas within the nondependent bladder lumen due to instrumentation. Stomach/Bowel: Normal non-distended stomach. Normal caliber small bowel with no small bowel wall thickening. Normal appendix. Oral contrast transits to the right colon. Stable postsurgical changes from subtotal distal colectomy with intact appearing distal colonic anastomosis. Moderate diffuse diverticulosis in the remnant large-bowel, with no acute large bowel wall thickening or acute pericolonic fat stranding. Vascular/Lymphatic: Atherosclerotic nonaneurysmal abdominal aorta. Similar soft tissue at least partially encasing the proximal SMA and  celiac trunk. No pathologically enlarged lymph nodes in the abdomen or pelvis. Reproductive: Moderate prostatomegaly. Other: No pneumoperitoneum, ascites or focal fluid collection. Stable moderate fat containing supraumbilical midline ventral abdominal hernia. Musculoskeletal: No aggressive appearing focal osseous lesions. Moderate thoracolumbar spondylosis. IMPRESSION: 1. Locally advanced pancreatic head neoplasm  appears stable. Progressive parenchymal atrophy in the pancreatic body and tail. 2. Stable well-positioned patent CBD stent. 3. No findings of metastatic disease on this noncontrast CT. 4. No acute abnormality. No evidence of bowel obstruction or acute bowel inflammation. Moderate diffuse colonic diverticulosis, with no noncontrast CT findings of acute diverticulitis. 5. Moderate prostatomegaly. Bladder decompressed by indwelling Foley catheter. 6. Stable moderate fat containing supraumbilical midline ventral abdominal hernia. 7.  Aortic Atherosclerosis (ICD10-I70.0). Electronically Signed   By: Ilona Sorrel M.D.   On: 10/19/2018 14:59    Pending Labs Unresulted Labs (From admission, onward)    Start     Ordered   10/19/18 1120  Urine culture  ONCE - STAT,   STAT     10/19/18 1121          Vitals/Pain Today's Vitals   10/19/18 1450 10/19/18 1451 10/19/18 1530 10/19/18 1630  BP:   124/73 121/77  Pulse:   (!) 50 61  Resp:   14 16  Temp:    98.7 F (37.1 C)  TempSrc:    Oral  SpO2:   98% 100%  Weight:      Height:      PainSc: 0-No pain Asleep      Isolation Precautions No active isolations  Medications Medications  apixaban (ELIQUIS) tablet 5 mg (has no administration in time range)  sodium chloride 0.9 % bolus 1,000 mL (1,000 mLs Intravenous New Bag/Given (Non-Interop) 10/19/18 1138)  morphine 4 MG/ML injection 4 mg (4 mg Intravenous Given 10/19/18 1140)  iohexol (OMNIPAQUE) 300 MG/ML solution 30 mL (30 mLs Oral Contrast Given 10/19/18 1224)    Mobility walks      Focused Assessments    R Recommendations: See Admitting Provider Note  Report given to:   Additional Notes:

## 2018-10-19 NOTE — ED Provider Notes (Signed)
[place] DEPT Provider Note   CSN: [id number] Arrival date & time: 10/19/18  1025    History   Chief Complaint Chief Complaint  Patient presents with   Pancreatic Cancer   Abdominal Pain   Groin Pain   Urinary Retention   Constipation    HPI [name] [name] is a 68 y.o. male.     HPI   This morning 130AM-2AM pain intensified and at 230AM was unable to urinate but feels like needs to all the time Has lower left sided abdominal pain and radiates to testicles, thought maybe U, continuous but will wax and wane as it does now. Can't sit on bottom because pain intensifies. No upper abdominal pain. Since 2AM feel like need to have BM but can't, no constipation prior to that in last few days Had burning with urination started around the same time No fevers, no cough, No nausea/vomiting  Past Medical History:  Diagnosis Date   Allergy    Asthma    Atrial fibrillation ([place])    a. initially occuring in 2014, has declined anticoagulation since having a GI bleed with initiation of Xarelto   Cancer [facility])    Cardiomyopathy 2011   a. Patient was told that he had an ejection fraction of 37% by echo done elsewhere in 2011 b. 06/2014: echo showing preserved EF of 55-60%, severe asymetric septal hypertrophy with no SAM or LVOT gradient   COPD (chronic obstructive pulmonary disease) ([place])    Diabetes mellitus without complication ([place])    Diverticulitis    GERD (gastroesophageal reflux disease)    History of colon polyps    Hypertension    Tobacco abuse     Patient Active Problem List   Diagnosis Date Noted   Urinary retention 10/19/2018   Pancreatic cancer ([place]) 10/19/2018   Acute kidney injury superimposed on CKD ([place]) 10/19/2018   Biliary obstruction    Pancreatic mass    Jaundice    Abnormal finding of biliary tract    Atrial flutter by electrocardiogram ([place]) 03/16/2018   TIA (transient ischemic attack) 02/23/2018     Overweight (BMI 25.0-29.9) 03/11/2016   Allergic rhinitis due to pollen 09/04/2015   Type 2 diabetes mellitus with diabetic nephropathy, with long-term current use of insulin ([place]) 08/24/2015   History of colonic polyps 12/29/2014   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   Tobacco abuse 06/10/2014   BPH associated with nocturia 03/15/2014   Routine general medical examination at a health care facility 11/05/2012   Atrial fibrillation ([place]) 10/19/2012   Hyperlipidemia with target LDL less than 100 05/05/2012   GERD (gastroesophageal reflux disease)    COPD (chronic obstructive pulmonary disease) ([place])    Cardiomyopathy ([place])    Essential hypertension, benign 06/26/2011    Past Surgical History:  Procedure Laterality Date   BILIARY STENT PLACEMENT  07/30/2018   Procedure: BILIARY STENT PLACEMENT;  Surgeon: Ladene Artist, MD;  Location: [facility] ENDOSCOPY;  Service: Endoscopy;;   BILIARY STENT PLACEMENT N/A 08/13/2018   Procedure: BILIARY STENT PLACEMENT;  Surgeon: Milus Banister, MD;  Location: [facility] ENDOSCOPY;  Service: Endoscopy;  Laterality: N/A;   COLON SURGERY  2009   COLONOSCOPY     ERCP     ERCP N/A 07/30/2018   Procedure: ENDOSCOPIC RETROGRADE CHOLANGIOPANCREATOGRAPHY (ERCP);  Surgeon: Ladene Artist, MD;  Location: [facility] ENDOSCOPY;  Service: Endoscopy;  Laterality: N/A;   ERCP N/A 08/13/2018   Procedure: ENDOSCOPIC RETROGRADE CHOLANGIOPANCREATOGRAPHY (ERCP);  Surgeon: Milus Banister, MD;  Location:  [facility] ENDOSCOPY;  Service: Endoscopy;  Laterality: N/A;   ESOPHAGOGASTRODUODENOSCOPY N/A 08/13/2018   Procedure: ESOPHAGOGASTRODUODENOSCOPY (EGD);  Surgeon: Milus Banister, MD;  Location: [facility] ENDOSCOPY;  Service: Endoscopy;  Laterality: N/A;   ESOPHAGOGASTRODUODENOSCOPY (EGD) WITH PROPOFOL N/A 10/08/2018   Procedure: ESOPHAGOGASTRODUODENOSCOPY (EGD) WITH PROPOFOL;  Surgeon: Milus Banister, MD;  Location: [facility] ENDOSCOPY;  Service: Endoscopy;  Laterality: N/A;   EUS N/A  08/13/2018   Procedure: UPPER ENDOSCOPIC ULTRASOUND (EUS) RADIAL;  Surgeon: Milus Banister, MD;  Location: [facility] ENDOSCOPY;  Service: Endoscopy;  Laterality: N/A;   EUS N/A 10/08/2018   Procedure: UPPER ENDOSCOPIC ULTRASOUND (EUS) RADIAL;  Surgeon: Milus Banister, MD;  Location: [facility] ENDOSCOPY;  Service: Endoscopy;  Laterality: N/A;   FINE NEEDLE ASPIRATION N/A 08/13/2018   Procedure: FINE NEEDLE ASPIRATION (FNA) LINEAR;  Surgeon: Milus Banister, MD;  Location: [facility] ENDOSCOPY;  Service: Endoscopy;  Laterality: N/A;   FINE NEEDLE ASPIRATION  10/08/2018   Procedure: FINE NEEDLE ASPIRATION;  Surgeon: Milus Banister, MD;  Location: [facility] ENDOSCOPY;  Service: Endoscopy;;   STENT REMOVAL  08/13/2018   Procedure: STENT REMOVAL;  Surgeon: Milus Banister, MD;  Location: [facility] ENDOSCOPY;  Service: Endoscopy;;        Home Medications    Prior to Admission medications   Medication Sig Start Date End Date Taking? Authorizing Provider  acetaminophen (TYLENOL) 500 MG tablet Take 1,000 mg by mouth every 6 (six) hours as needed (pain).   Yes [provider]  albuterol (PROVENTIL HFA;VENTOLIN HFA) 108 (90 Base) MCG/ACT inhaler Inhale 2 puffs into the lungs every 6 (six) hours as needed for wheezing. 09/23/17  Yes Janith Lima, MD  alum & mag hydroxide-simeth (MAALOX/MYLANTA) 200-200-20 MG/5ML suspension Take 30 mLs by mouth every 6 (six) hours as needed for indigestion or heartburn.   Yes [provider]  amLODipine (NORVASC) 5 MG tablet TAKE 1 TABLET BY MOUTH ONCE DAILY Patient taking differently: Take 5 mg by mouth daily.  07/15/18  Yes Strader, [place] M, PA-C  apixaban (ELIQUIS) 5 MG TABS tablet Take 1 tablet (5 mg total) by mouth 2 (two) times daily. 06/19/18  Yes Lelon Perla, MD  fish oil-omega-3 fatty acids 1000 MG capsule Take 2 g by mouth daily.    Yes [provider]  hydrochlorothiazide (HYDRODIURIL) 25 MG tablet TAKE 1 TABLET BY MOUTH ONCE DAILY Patient taking  differently: Take 25 mg by mouth daily.  06/26/18  Yes Janith Lima, MD  Insulin Detemir (LEVEMIR FLEXPEN) 100 UNIT/ML Pen Inject 15 units once daily. Patient taking differently: Inject 15 Units into the skin at bedtime.  07/31/16  Yes Philemon Kingdom, MD  insulin regular (NOVOLIN R RELION) 100 units/mL injection INJECT 5-8 UNITS SUBCUTANEOUSLY THREE TIMES DAILY before meals Patient taking differently: Inject 5-8 Units into the skin 3 (three) times daily as needed for high blood sugar (CBG >120).  04/16/17  Yes Philemon Kingdom, MD  losartan (COZAAR) 100 MG tablet TAKE 1 TABLET BY MOUTH ONCE DAILY Patient taking differently: Take 100 mg by mouth daily.  06/30/18  Yes Strader, [place] M, PA-C  metoprolol succinate (TOPROL-XL) 50 MG 24 hr tablet TAKE 1 TABLET BY MOUTH ONCE DAILY WITH  OR  IMMEDIATELY  FOLLOWING  A  MEAL Patient taking differently: Take 50 mg by mouth daily.  07/20/18  Yes Lelon Perla, MD  Multiple Vitamin (MULTIVITAMIN WITH MINERALS) TABS tablet Take 1 tablet by mouth daily.   Yes [provider]  naloxegol oxalate (Juda)  25 MG TABS tablet Take 1 tablet (25 mg total) by mouth daily. 09/21/18  Yes Cirigliano, Vito V, DO  omeprazole (PRILOSEC) 20 MG capsule Take 1 capsule (20 mg total) by mouth daily. 09/28/18  Yes Janith Lima, MD  glucose blood (ONETOUCH VERIO) test strip USE AS INSTRUCTED THREE TIMES DAILY. DX CODE E11.21 01/12/18   Philemon Kingdom, MD  Insulin Syringe-Needle U-100 (EASY TOUCH INSULIN SYRINGE) 31G X 5/16" 0.3 ML MISC USE AS DIRECTED 4 TIMES DAILY 05/01/16   Philemon Kingdom, MD  metoprolol succinate (TOPROL-XL) 50 MG 24 hr tablet Take 1 tablet (50 mg total) by mouth daily for 30 days. 09/11/18 10/11/18  Orson Aloe, MD    Family History Family History  Problem Relation Age of Onset   Prostate cancer Father    Colon cancer Father    Diabetes Father    Arthritis Father    Diabetes type II Mother    Atrial fibrillation Mother     Hypertension Mother    Arthritis Mother    Cancer Other        Colon,Breast, Prostate Cancer   Hypertension Other    Prostate cancer Brother        5 brothers - 2 were dx with prostate cancer   Esophageal cancer Neg Hx    Stomach cancer Neg Hx    Rectal cancer Neg Hx     Social History Social History   Tobacco Use   Smoking status: Former Smoker    Types: Cigarettes    Last attempt to quit: 07/30/2011    Years since quitting: 7.2   Smokeless tobacco: Never Used   Tobacco comment: Smoked since age 16  Substance Use Topics   Alcohol use: Yes    Alcohol/week: 1.0 standard drinks    Types: 1 Standard drinks or equivalent per week   Drug use: No     Allergies   Hydralazine; Lisinopril; Penicillins; and Tribenzor [olmesartan-amlodipine-hctz]   Review of Systems Review of Systems  Constitutional: Positive for appetite change and fatigue. Negative for fever.  HENT: Negative for sore throat.   Eyes: Negative for visual disturbance.  Respiratory: Negative for shortness of breath.   Cardiovascular: Negative for chest pain.  Gastrointestinal: Positive for abdominal pain and constipation. Negative for nausea.  Genitourinary: Positive for difficulty urinating and dysuria.  Musculoskeletal: Negative for back pain and neck stiffness.  Skin: Negative for rash.  Neurological: Negative for syncope and headaches.     Physical Exam Updated Vital Signs BP 126/79 (BP Location: Left Arm)    Pulse 73    Temp 98.9 F (37.2 C) (Oral)    Resp 16    Ht 5\' 10"  (1.778 m)    Wt 74.8 kg    SpO2 99%    BMI 23.66 kg/m   Physical Exam Vitals signs and nursing note reviewed.  Constitutional:      General: He is not in acute distress.    Appearance: He is well-developed. He is not diaphoretic.  HENT:     Head: Normocephalic and atraumatic.  Eyes:     Conjunctiva/sclera: Conjunctivae normal.  Neck:     Musculoskeletal: Normal range of motion.  Cardiovascular:     Rate and  Rhythm: Normal rate and regular rhythm.     Heart sounds: Normal heart sounds. No murmur. No friction rub. No gallop.   Pulmonary:     Effort: Pulmonary effort is normal. No respiratory distress.     Breath sounds: Normal breath sounds. No wheezing  or rales.  Abdominal:     General: There is no distension.     Palpations: Abdomen is soft.     Tenderness: There is abdominal tenderness in the suprapubic area and left lower quadrant. There is no guarding.  Genitourinary:    Prostate: Not tender.     Rectum: Normal.  Skin:    General: Skin is warm and dry.  Neurological:     Mental Status: He is alert and oriented to person, place, and time.      ED Treatments / Results  Labs (all labs ordered are listed, but only abnormal results are displayed) Labs Reviewed  CBC WITH DIFFERENTIAL/PLATELET - Abnormal; Notable for the following components:      Result Value   RBC 4.06 (*)    Hemoglobin 12.6 (*)    Platelets 458 (*)    All other components within normal limits  COMPREHENSIVE METABOLIC PANEL - Abnormal; Notable for the following components:   Chloride 94 (*)    Glucose, Bld 141 (*)    BUN 39 (*)    Creatinine, Ser 2.55 (*)    Total Protein 8.4 (*)    Total Bilirubin 2.3 (*)    GFR calc non Af Amer 25 (*)    GFR calc Af Amer 29 (*)    Anion gap 17 (*)    All other components within normal limits  URINALYSIS, ROUTINE W REFLEX MICROSCOPIC - Abnormal; Notable for the following components:   Ketones, ur 5 (*)    All other components within normal limits  LIPASE, BLOOD - Abnormal; Notable for the following components:   Lipase 73 (*)    All other components within normal limits  GLUCOSE, CAPILLARY - Abnormal; Notable for the following components:   Glucose-Capillary 116 (*)    All other components within normal limits  URINE CULTURE  GLUCOSE, CAPILLARY  BASIC METABOLIC PANEL    EKG None  Radiology Ct Abdomen Pelvis Wo Contrast  Result Date: 10/19/2018 CLINICAL DATA:   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 CBD stent placed January 2020. Abdominal pain. EXAM: CT ABDOMEN AND PELVIS WITHOUT CONTRAST TECHNIQUE: Multidetector CT imaging of the abdomen and pelvis was performed following the standard protocol without IV contrast. COMPARISON:  09/10/2018 CT abdomen. 07/28/2018 MRI abdomen. 07/27/2018 unenhanced CT abdomen/pelvis. FINDINGS: Lower chest: No significant pulmonary nodules or acute consolidative airspace disease. Coronary atherosclerosis. Hepatobiliary: Normal liver size. No liver mass. Pneumobilia throughout the mildly dilated left liver lobe intrahepatic bile ducts, unchanged. Pneumobilia in the nondistended gallbladder with no radiopaque cholelithiasis. No gallbladder wall thickening or pericholecystic fluid. Reflux of oral contrast into right liver lobe bile ducts. Well-positioned CBD stent with pneumobilia in CBD. Distal CBD stent tip in the descending duodenal lumen. Pancreas: Poorly marginated pancreatic head 5.6 x 3.0 cm mass (series 2/image 31), previously 5.7 x 3.2 cm on 09/10/2018 CT, not appreciably changed. Stable mild pancreatic duct dilation in the pancreatic body and tail. Progression of pancreatic parenchymal atrophy in the pancreatic body and tail. Spleen: Atrophic spleen.  No splenic mass. Adrenals/Urinary Tract: Normal adrenals. No renal stones. No hydronephrosis. Small simple bilateral renal cysts, largest 1.8 cm in the medial upper right kidney. No new contour deforming renal lesions. Bladder collapsed by indwelling Foley catheter. Expected gas within the nondependent bladder lumen due to instrumentation. Stomach/Bowel: Normal non-distended stomach. Normal caliber small bowel with no small bowel wall thickening. Normal appendix. Oral contrast transits to the right colon. Stable postsurgical changes from subtotal distal colectomy with intact  appearing distal colonic anastomosis. Moderate diffuse diverticulosis in the remnant  large-bowel, with no acute large bowel wall thickening or acute pericolonic fat stranding. Vascular/Lymphatic: Atherosclerotic nonaneurysmal abdominal aorta. Similar soft tissue at least partially encasing the proximal SMA and celiac trunk. No pathologically enlarged lymph nodes in the abdomen or pelvis. Reproductive: Moderate prostatomegaly. Other: No pneumoperitoneum, ascites or focal fluid collection. Stable moderate fat containing supraumbilical midline ventral abdominal hernia. Musculoskeletal: No aggressive appearing focal osseous lesions. Moderate thoracolumbar spondylosis. IMPRESSION: 1. Locally advanced pancreatic head neoplasm appears stable. Progressive parenchymal atrophy in the pancreatic body and tail. 2. Stable well-positioned patent CBD stent. 3. No findings of metastatic disease on this noncontrast CT. 4. No acute abnormality. No evidence of bowel obstruction or acute bowel inflammation. Moderate diffuse colonic diverticulosis, with no noncontrast CT findings of acute diverticulitis. 5. Moderate prostatomegaly. Bladder decompressed by indwelling Foley catheter. 6. Stable moderate fat containing supraumbilical midline ventral abdominal hernia. 7.  Aortic Atherosclerosis (ICD10-I70.0). Electronically Signed   By: Ilona Sorrel M.D.   On: 10/19/2018 14:59    Procedures Procedures (including critical care time)  Medications Ordered in ED Medications  acetaminophen (TYLENOL) tablet 1,000 mg (has no administration in time range)  amLODipine (NORVASC) tablet 5 mg (has no administration in time range)  losartan (COZAAR) tablet 100 mg (has no administration in time range)  metoprolol succinate (TOPROL-XL) 24 hr tablet 50 mg (50 mg Oral Given 10/19/18 1802)  insulin detemir (LEVEMIR) injection 15 Units (15 Units Subcutaneous Not Given 10/19/18 2215)  insulin aspart (novoLOG) injection 8 Units (has no administration in time range)  alum & mag hydroxide-simeth (MAALOX/MYLANTA) 200-200-20 MG/5ML  suspension 30 mL (has no administration in time range)  naloxegol oxalate (MOVANTIK) tablet 25 mg (has no administration in time range)  pantoprazole (PROTONIX) EC tablet 40 mg (40 mg Oral Given 10/19/18 1802)  apixaban (ELIQUIS) tablet 5 mg (has no administration in time range)  omega-3 acid ethyl esters (LOVAZA) capsule 2 g (has no administration in time range)  multivitamin with minerals tablet 1 tablet (has no administration in time range)  albuterol (PROVENTIL) (2.5 MG/3ML) 0.083% nebulizer solution 3 mL (has no administration in time range)  tamsulosin (FLOMAX) capsule 0.4 mg (0.4 mg Oral Given 10/19/18 1802)  feeding supplement (ENSURE ENLIVE) (ENSURE ENLIVE) liquid 237 mL (has no administration in time range)  sodium chloride 0.9 % bolus 1,000 mL (0 mLs Intravenous Stopped 10/19/18 1657)  morphine 4 MG/ML injection 4 mg (4 mg Intravenous Given 10/19/18 1140)  iohexol (OMNIPAQUE) 300 MG/ML solution 30 mL (30 mLs Oral Contrast Given 10/19/18 1224)  apixaban (ELIQUIS) tablet 5 mg (5 mg Oral Given 10/19/18 1802)     Initial Impression / Assessment and Plan / ED Course  I have reviewed the triage vital signs and the nursing notes.  Pertinent labs & imaging results that were available during my care of the patient were reviewed by me and considered in my medical decision making (see chart for details).        89YN male with complicated history above including hx of diverticulitis presents with concern for left lower quadrant pain, urinary retention and constipation.  No sign of UTI or prostatitis.  CT abdomen shows no acute findings.  Pt with urinary retention in ED and foley catheter placed. Pain improved in ED. AKI present with elevated BUN, no hydronephrosis on CT.  Suspect multifactorial AKI with dehydration and urinary retention. Given IV fluids, will admit for continued hydration and care for AKI. Will need urology  follow up.   Final Clinical Impressions(s) / ED Diagnoses   Final  diagnoses:  AKI (acute kidney injury) [facility])  Urinary retention  Dehydration    ED Discharge Orders    None       Gareth Morgan, MD 10/20/18 0030

## 2018-10-19 NOTE — ED Notes (Signed)
Bladder Scan 416ML

## 2018-10-19 NOTE — H&P (Signed)
History and Physical    [name] PTW:[id number] DOB: March 29, 1951 DOA: 10/19/2018  PCP: Janith Lima, MD  Patient coming from: Home.  I have personally briefly reviewed patient's old medical records available.   Chief Complaint: Abdominal pain.  HPI: [name] [name] is a 68 y.o. male with medical history significant of recently diagnosed pancreatic cancer, paroxysmal atrial fibrillation, asthma, GERD, hypertension, type 2 diabetes on insulin, history of BPH who is presenting to the emergency room with lower abdominal pain, constipation and unable to urinate.  Patient is difficult historian.  On interview, he deviates and talks about different issues than what is discussed.  I reviewed his previous records, previous hospitalization.  Patient states that he does not have any prostate problem, on further questioning he does endorse that he has hesitancy and incomplete voiding for long time.  Today morning he started having left-sided abdominal pain, going to his groin, severe, intermittent.  He felt like he has to urinate but he could not.  Patient says he does not have constipation, he is review of records shows that he is on naloxegol for constipation.  Denies any urinary symptoms including burning, change in color. ED Course: Hemodynamically stable.  Creatinine 2.5 which is slightly worse than his baseline.  His baseline creatinine about 1.7.  Reportedly bladder scan showed 450 mL so a catheter was placed in the ER which is draining 450 mL and then followed by 1 L of urine. Because of acute renal injury, monitoring for urinary obstruction he was advised admission.  Review of Systems: As per HPI otherwise 10 point review of systems negative.    Past Medical History:  Diagnosis Date   Allergy    Asthma    Atrial fibrillation ([place])    a. initially occuring in 2014, has declined anticoagulation since having a GI bleed with initiation of Xarelto   Cancer [facility])    Cardiomyopathy 2011     a. Patient was told that he had an ejection fraction of 37% by echo done elsewhere in 2011 b. 06/2014: echo showing preserved EF of 55-60%, severe asymetric septal hypertrophy with no SAM or LVOT gradient   COPD (chronic obstructive pulmonary disease) ([place])    Diabetes mellitus without complication ([place])    Diverticulitis    GERD (gastroesophageal reflux disease)    History of colon polyps    Hypertension    Tobacco abuse     Past Surgical History:  Procedure Laterality Date   BILIARY STENT PLACEMENT  07/30/2018   Procedure: BILIARY STENT PLACEMENT;  Surgeon: Ladene Artist, MD;  Location: [facility] ENDOSCOPY;  Service: Endoscopy;;   BILIARY STENT PLACEMENT N/A 08/13/2018   Procedure: BILIARY STENT PLACEMENT;  Surgeon: Milus Banister, MD;  Location: [facility] ENDOSCOPY;  Service: Endoscopy;  Laterality: N/A;   COLON SURGERY  2009   COLONOSCOPY     ERCP     ERCP N/A 07/30/2018   Procedure: ENDOSCOPIC RETROGRADE CHOLANGIOPANCREATOGRAPHY (ERCP);  Surgeon: Ladene Artist, MD;  Location: [facility] ENDOSCOPY;  Service: Endoscopy;  Laterality: N/A;   ERCP N/A 08/13/2018   Procedure: ENDOSCOPIC RETROGRADE CHOLANGIOPANCREATOGRAPHY (ERCP);  Surgeon: Milus Banister, MD;  Location: [facility] ENDOSCOPY;  Service: Endoscopy;  Laterality: N/A;   ESOPHAGOGASTRODUODENOSCOPY N/A 08/13/2018   Procedure: ESOPHAGOGASTRODUODENOSCOPY (EGD);  Surgeon: Milus Banister, MD;  Location: [facility] ENDOSCOPY;  Service: Endoscopy;  Laterality: N/A;   ESOPHAGOGASTRODUODENOSCOPY (EGD) WITH PROPOFOL N/A 10/08/2018   Procedure: ESOPHAGOGASTRODUODENOSCOPY (EGD) WITH PROPOFOL;  Surgeon: Milus Banister, MD;  Location: [facility] ENDOSCOPY;  Service: Endoscopy;  Laterality: N/A;   EUS N/A 08/13/2018   Procedure: UPPER ENDOSCOPIC ULTRASOUND (EUS) RADIAL;  Surgeon: Milus Banister, MD;  Location: [facility] ENDOSCOPY;  Service: Endoscopy;  Laterality: N/A;   EUS N/A 10/08/2018   Procedure: UPPER ENDOSCOPIC ULTRASOUND (EUS) RADIAL;  Surgeon: Milus Banister, MD;  Location: [facility] ENDOSCOPY;  Service: Endoscopy;  Laterality: N/A;   FINE NEEDLE ASPIRATION N/A 08/13/2018   Procedure: FINE NEEDLE ASPIRATION (FNA) LINEAR;  Surgeon: Milus Banister, MD;  Location: [facility] ENDOSCOPY;  Service: Endoscopy;  Laterality: N/A;   FINE NEEDLE ASPIRATION  10/08/2018   Procedure: FINE NEEDLE ASPIRATION;  Surgeon: Milus Banister, MD;  Location: [facility] ENDOSCOPY;  Service: Endoscopy;;   STENT REMOVAL  08/13/2018   Procedure: STENT REMOVAL;  Surgeon: Milus Banister, MD;  Location: [facility] ENDOSCOPY;  Service: Endoscopy;;     reports that he quit smoking about 7 years ago. His smoking use included cigarettes. He has never used smokeless tobacco. He reports current alcohol use of about 1.0 standard drinks of alcohol per week. He reports that he does not use drugs.  Allergies  Allergen Reactions   Hydralazine Shortness Of Breath    Heart palpitations   Lisinopril Cough   Penicillins Other (See Comments)    [place] DID THE REACTION INVOLVE: Swelling of the face/tongue/throat, SOB, or low BP? No Yes Sudden or severe rash/hives, skin peeling, or the inside of the mouth or nose? No Did it require medical treatment? No When did it last happen?teens  If all above answers are NO, may proceed with cephalosporin use.   Tribenzor [Olmesartan-Amlodipine-Hctz] Nausea Only    dizziness    Family History  Problem Relation Age of Onset   Prostate cancer Father    Colon cancer Father    Diabetes Father    Arthritis Father    Diabetes type II Mother    Atrial fibrillation Mother    Hypertension Mother    Arthritis Mother    Cancer Other        Colon,Breast, Prostate Cancer   Hypertension Other    Prostate cancer Brother        5 brothers - 2 were dx with prostate cancer   Esophageal cancer Neg Hx    Stomach cancer Neg Hx    Rectal cancer Neg Hx      Prior to Admission medications   Medication Sig Start Date End Date Taking? Authorizing  Provider  acetaminophen (TYLENOL) 500 MG tablet Take 1,000 mg by mouth every 6 (six) hours as needed (pain).   Yes [provider]  albuterol (PROVENTIL HFA;VENTOLIN HFA) 108 (90 Base) MCG/ACT inhaler Inhale 2 puffs into the lungs every 6 (six) hours as needed for wheezing. 09/23/17  Yes Janith Lima, MD  alum & mag hydroxide-simeth (MAALOX/MYLANTA) 200-200-20 MG/5ML suspension Take 30 mLs by mouth every 6 (six) hours as needed for indigestion or heartburn.   Yes [provider]  amLODipine (NORVASC) 5 MG tablet TAKE 1 TABLET BY MOUTH ONCE DAILY Patient taking differently: Take 5 mg by mouth daily.  07/15/18  Yes Strader, [place] M, PA-C  apixaban (ELIQUIS) 5 MG TABS tablet Take 1 tablet (5 mg total) by mouth 2 (two) times daily. 06/19/18  Yes Lelon Perla, MD  fish oil-omega-3 fatty acids 1000 MG capsule Take 2 g by mouth daily.    Yes [provider]  hydrochlorothiazide (HYDRODIURIL) 25 MG tablet TAKE 1 TABLET BY MOUTH ONCE DAILY Patient taking differently: Take 25  mg by mouth daily.  06/26/18  Yes Janith Lima, MD  Insulin Detemir (LEVEMIR FLEXPEN) 100 UNIT/ML Pen Inject 15 units once daily. Patient taking differently: Inject 15 Units into the skin at bedtime.  07/31/16  Yes Philemon Kingdom, MD  insulin regular (NOVOLIN R RELION) 100 units/mL injection INJECT 5-8 UNITS SUBCUTANEOUSLY THREE TIMES DAILY before meals Patient taking differently: Inject 5-8 Units into the skin 3 (three) times daily as needed for high blood sugar (CBG >120).  04/16/17  Yes Philemon Kingdom, MD  losartan (COZAAR) 100 MG tablet TAKE 1 TABLET BY MOUTH ONCE DAILY Patient taking differently: Take 100 mg by mouth daily.  06/30/18  Yes Strader, [place] M, PA-C  metoprolol succinate (TOPROL-XL) 50 MG 24 hr tablet TAKE 1 TABLET BY MOUTH ONCE DAILY WITH  OR  IMMEDIATELY  FOLLOWING  A  MEAL Patient taking differently: Take 50 mg by mouth daily.  07/20/18  Yes Lelon Perla, MD  Multiple  Vitamin (MULTIVITAMIN WITH MINERALS) TABS tablet Take 1 tablet by mouth daily.   Yes [provider]  naloxegol oxalate (MOVANTIK) 25 MG TABS tablet Take 1 tablet (25 mg total) by mouth daily. 09/21/18  Yes Cirigliano, Vito V, DO  omeprazole (PRILOSEC) 20 MG capsule Take 1 capsule (20 mg total) by mouth daily. 09/28/18  Yes Janith Lima, MD  glucose blood (ONETOUCH VERIO) test strip USE AS INSTRUCTED THREE TIMES DAILY. DX CODE E11.21 01/12/18   Philemon Kingdom, MD  Insulin Syringe-Needle U-100 (EASY TOUCH INSULIN SYRINGE) 31G X 5/16" 0.3 ML MISC USE AS DIRECTED 4 TIMES DAILY 05/01/16   Philemon Kingdom, MD  metoprolol succinate (TOPROL-XL) 50 MG 24 hr tablet Take 1 tablet (50 mg total) by mouth daily for 30 days. 09/11/18 10/11/18  Orson Aloe, MD    Physical Exam: Vitals:   10/19/18 1336 10/19/18 1430 10/19/18 1530 10/19/18 1630  BP: (!) 156/92 137/84 124/73 121/77  Pulse: (!) 56  (!) 50 61  Resp: 14 15 14 16   Temp:    98.7 F (37.1 C)  TempSrc:    Oral  SpO2: 100% 100% 98% 100%  Weight:      Height:        Constitutional: NAD, calm, comfortable Vitals:   10/19/18 1336 10/19/18 1430 10/19/18 1530 10/19/18 1630  BP: (!) 156/92 137/84 124/73 121/77  Pulse: (!) 56  (!) 50 61  Resp: 14 15 14 16   Temp:    98.7 F (37.1 C)  TempSrc:    Oral  SpO2: 100% 100% 98% 100%  Weight:      Height:       Eyes: PERRL, lids and conjunctivae normal ENMT: Mucous membranes are moist. Posterior pharynx clear of any exudate or lesions.Normal dentition.  Neck: normal, supple, no masses, no thyromegaly Respiratory: clear to auscultation bilaterally, no wheezing, no crackles. Normal respiratory effort. No accessory muscle use.  Cardiovascular: Regular rate and rhythm, no murmurs / rubs / gallops. No extremity edema. 2+ pedal pulses. No carotid bruits.  Abdomen: no tenderness, no masses palpated. No hepatosplenomegaly. Bowel sounds positive.  Musculoskeletal: no clubbing / cyanosis. No  joint deformity upper and lower extremities. Good ROM, no contractures. Normal muscle tone.  Skin: no rashes, lesions, ulcers. No induration Neurologic: CN 2-12 grossly intact. Sensation intact, DTR normal. Strength 5/5 in all 4.  Psychiatric: Normal judgment and insight. Alert and oriented x 3. Normal mood.   Foley catheter with clear urine.  Labs on Admission: I have personally reviewed following labs and  imaging studies  CBC: Recent Labs  Lab 10/19/18 1120  WBC 6.5  NEUTROABS 4.9  HGB 12.6*  HCT 39.3  MCV 96.8  PLT 366*   Basic Metabolic Panel: Recent Labs  Lab 10/19/18 1120  NA 136  K 3.6  CL 94*  CO2 25  GLUCOSE 141*  BUN 39*  CREATININE 2.55*  CALCIUM 9.7   GFR: Estimated Creatinine Clearance: 28.2 mL/min (A) (by C-G formula based on SCr of 2.55 mg/dL (H)). Liver Function Tests: Recent Labs  Lab 10/19/18 1120  AST 27  ALT 17  ALKPHOS 84  BILITOT 2.3*  PROT 8.4*  ALBUMIN 4.3   Recent Labs  Lab 10/19/18 1120  LIPASE 73*   No results for input(s): AMMONIA in the last 168 hours. Coagulation Profile: No results for input(s): INR, PROTIME in the last 168 hours. Cardiac Enzymes: No results for input(s): CKTOTAL, CKMB, CKMBINDEX, TROPONINI in the last 168 hours. BNP (last 3 results) No results for input(s): PROBNP in the last 8760 hours. HbA1C: No results for input(s): HGBA1C in the last 72 hours. CBG: No results for input(s): GLUCAP in the last 168 hours. Lipid Profile: No results for input(s): CHOL, HDL, LDLCALC, TRIG, CHOLHDL, LDLDIRECT in the last 72 hours. Thyroid Function Tests: No results for input(s): TSH, T4TOTAL, FREET4, T3FREE, THYROIDAB in the last 72 hours. Anemia Panel: No results for input(s): VITAMINB12, FOLATE, FERRITIN, TIBC, IRON, RETICCTPCT in the last 72 hours. Urine analysis:    Component Value Date/Time   COLORURINE YELLOW 10/19/2018 1120   APPEARANCEUR CLEAR 10/19/2018 1120   LABSPEC 1.014 10/19/2018 1120   PHURINE 6.0  10/19/2018 1120   GLUCOSEU NEGATIVE 10/19/2018 1120   GLUCOSEU NEGATIVE 08/19/2018 1357   HGBUR NEGATIVE 10/19/2018 1120   BILIRUBINUR NEGATIVE 10/19/2018 1120   KETONESUR 5 (A) 10/19/2018 1120   PROTEINUR NEGATIVE 10/19/2018 1120   UROBILINOGEN 0.2 08/19/2018 1357   NITRITE NEGATIVE 10/19/2018 1120   LEUKOCYTESUR NEGATIVE 10/19/2018 1120    Radiological Exams on Admission: Ct Abdomen Pelvis Wo Contrast  Result Date: 10/19/2018 CLINICAL DATA:  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 CBD stent placed January 2020. Abdominal pain. EXAM: CT ABDOMEN AND PELVIS WITHOUT CONTRAST TECHNIQUE: Multidetector CT imaging of the abdomen and pelvis was performed following the standard protocol without IV contrast. COMPARISON:  09/10/2018 CT abdomen. 07/28/2018 MRI abdomen. 07/27/2018 unenhanced CT abdomen/pelvis. FINDINGS: Lower chest: No significant pulmonary nodules or acute consolidative airspace disease. Coronary atherosclerosis. Hepatobiliary: Normal liver size. No liver mass. Pneumobilia throughout the mildly dilated left liver lobe intrahepatic bile ducts, unchanged. Pneumobilia in the nondistended gallbladder with no radiopaque cholelithiasis. No gallbladder wall thickening or pericholecystic fluid. Reflux of oral contrast into right liver lobe bile ducts. Well-positioned CBD stent with pneumobilia in CBD. Distal CBD stent tip in the descending duodenal lumen. Pancreas: Poorly marginated pancreatic head 5.6 x 3.0 cm mass (series 2/image 31), previously 5.7 x 3.2 cm on 09/10/2018 CT, not appreciably changed. Stable mild pancreatic duct dilation in the pancreatic body and tail. Progression of pancreatic parenchymal atrophy in the pancreatic body and tail. Spleen: Atrophic spleen.  No splenic mass. Adrenals/Urinary Tract: Normal adrenals. No renal stones. No hydronephrosis. Small simple bilateral renal cysts, largest 1.8 cm in the medial upper right kidney. No new contour deforming renal  lesions. Bladder collapsed by indwelling Foley catheter. Expected gas within the nondependent bladder lumen due to instrumentation. Stomach/Bowel: Normal non-distended stomach. Normal caliber small bowel with no small bowel wall thickening. Normal appendix. Oral contrast transits to the  right colon. Stable postsurgical changes from subtotal distal colectomy with intact appearing distal colonic anastomosis. Moderate diffuse diverticulosis in the remnant large-bowel, with no acute large bowel wall thickening or acute pericolonic fat stranding. Vascular/Lymphatic: Atherosclerotic nonaneurysmal abdominal aorta. Similar soft tissue at least partially encasing the proximal SMA and celiac trunk. No pathologically enlarged lymph nodes in the abdomen or pelvis. Reproductive: Moderate prostatomegaly. Other: No pneumoperitoneum, ascites or focal fluid collection. Stable moderate fat containing supraumbilical midline ventral abdominal hernia. Musculoskeletal: No aggressive appearing focal osseous lesions. Moderate thoracolumbar spondylosis. IMPRESSION: 1. Locally advanced pancreatic head neoplasm appears stable. Progressive parenchymal atrophy in the pancreatic body and tail. 2. Stable well-positioned patent CBD stent. 3. No findings of metastatic disease on this noncontrast CT. 4. No acute abnormality. No evidence of bowel obstruction or acute bowel inflammation. Moderate diffuse colonic diverticulosis, with no noncontrast CT findings of acute diverticulitis. 5. Moderate prostatomegaly. Bladder decompressed by indwelling Foley catheter. 6. Stable moderate fat containing supraumbilical midline ventral abdominal hernia. 7.  Aortic Atherosclerosis (ICD10-I70.0). Electronically Signed   By: Ilona Sorrel M.D.   On: 10/19/2018 14:59     Assessment/Plan Principal Problem:   Urinary retention Active Problems:   Essential hypertension, benign   GERD (gastroesophageal reflux disease)   COPD (chronic obstructive pulmonary  disease) ([facility])   Cardiomyopathy ([facility])   Hyperlipidemia with target LDL less than 100   Atrial fibrillation ([facility])   Type 2 diabetes mellitus with diabetic nephropathy, with long-term current use of insulin ([facility])   Pancreatic cancer ([place])   Acute kidney injury superimposed on CKD ([place])     1.  Urinary retention: Probably acute on chronic urinary retention due to BPH and constipation.  Foley catheter is placed in the ER.  Will continue tonight.  Will start patient on Flomax tonight, since he had less than 500 mL retention, will do voiding trial in the morning as patient does not want to go home with Foley catheter.  If recurrent retention, Foley catheter will be reinserted, will schedule follow-up with urology and discharged with catheter.  Urinalysis is normal.  No indication for antibiotics.  2.  Acute renal failure with history of chronic kidney disease stage III: Probably due to above.  Hydrated with IV fluids.  Will recheck levels in the morning to ensure stabilization.  3.  Essential hypertension: Fairly stable.  Resume all home medications.  Holding diuretics because of acute renal failure.  4.  GERD: On PPI that we will continue.  5.  Paroxysmal A. fib: Rate controlled.  He is therapeutic on Eliquis.  Rate controlled on metoprolol.  6.  Type 2 diabetes on insulin: Fairly controlled.  Will continue same doses of long-acting insulin as well as prandial insulin.  7.  Cancer of the pancreatic head: Follows outpatient.  He does not have any upper abdominal pain.    DVT prophylaxis: Eliquis Code Status: Full code Family Communication: No family at bedside Disposition Plan: Home when is stable.  Anticipate tomorrow. Consults called: None Admission status: MedSurg   Barb Merino MD [facility] Pager [phone]  If 7PM-7AM, please contact night-coverage [email or web address] Password [email or web address]  10/19/2018, 5:20 PM

## 2018-10-19 NOTE — ED Triage Notes (Signed)
Per [facility]- Pt resides at home. Pt recently DX with pancreatic Ca. Pt endorse stomach, groin pain. Denies N/V/D and fever. Pt also endorse urinary rentention and constipation sudden onset this AM.

## 2018-10-19 NOTE — Progress Notes (Signed)
Went to do nursing admission hx. Explained to pt who I am and that I am here to do his nursing history for upstairs. Pt complaining about care and not getting his eliquis. He put call light on and one of the er rn came in to answer his  questions. When I started asking him questions, he states that he has his medications with him. I told him that he should not take them that his nurses will be giving him his medications.Pt became angry and stated that he is leaving and going home. I told him that I will go and tell his nurse that he wants to leave. He then called me back in the room and said he is staying and he will answer the questions on nursing history. Admitting hospitalist came in to do his history. Lucius Conn BSN, RN-BC Admissions RN 10/19/2018 4:57 PM

## 2018-10-19 NOTE — ED Notes (Signed)
ED Provider at bedside. SCHLOSSMAN 

## 2018-10-19 NOTE — ED Notes (Signed)
Provider at bedside

## 2018-10-19 NOTE — ED Notes (Signed)
PER [facility] HOME MEDICATIONS IN THE ROOM

## 2018-10-19 NOTE — ED Notes (Signed)
HOSPITALIST AT BEDSIDE DISCUSSED PT'S REQUEST TO TAKE HOME MEDICATIONS. PT MADE AWARE OF POLICIES AGAINST TAKING HOME MEDICATIONS. ADMITTING RN Sherrian Divers WITNESSED CONVERSATION AT BEDSIDE. PT VERBALIZED UNDERSTANDING AFTER ADDITIONAL INQUIRIES MADE BY [facility] THE HOSPITALIST AND ADMITTING RN. NO CHANGE IN DECISION FROM EITHER STAFF ON HIS REQUEST.

## 2018-10-19 NOTE — ED Notes (Signed)
PT ATTEMPTED TO GO TO THE BATHROOM TO URINATE AND HAVE BM HOWEVER NOT ABLE TO GO

## 2018-10-20 DIAGNOSIS — R339 Retention of urine, unspecified: Secondary | ICD-10-CM | POA: Diagnosis not present

## 2018-10-20 LAB — BASIC METABOLIC PANEL
Anion gap: 11 (ref 5–15)
BUN: 35 mg/dL — ABNORMAL HIGH (ref 8–23)
CHLORIDE: 98 mmol/L (ref 98–111)
CO2: 27 mmol/L (ref 22–32)
Calcium: 9 mg/dL (ref 8.9–10.3)
Creatinine, Ser: 2.12 mg/dL — ABNORMAL HIGH (ref 0.61–1.24)
GFR calc Af Amer: 36 mL/min — ABNORMAL LOW (ref >60)
GFR calc non Af Amer: 31 mL/min — ABNORMAL LOW (ref >60)
Glucose, Bld: 111 mg/dL — ABNORMAL HIGH (ref 70–99)
Potassium: 3.1 mmol/L — ABNORMAL LOW (ref 3.5–5.1)
Sodium: 136 mmol/L (ref 135–145)

## 2018-10-20 LAB — GLUCOSE, CAPILLARY
Glucose-Capillary: 125 mg/dL — ABNORMAL HIGH (ref 70–99)
Glucose-Capillary: 79 mg/dL (ref 70–99)

## 2018-10-20 LAB — URINE CULTURE: Culture: NO GROWTH

## 2018-10-20 MED ORDER — OMEGA-3-ACID ETHYL ESTERS 1 G PO CAPS
2.0000 g | ORAL_CAPSULE | Freq: Every day | ORAL | Status: DC
  Administered 2018-10-20: 2 g via ORAL
  Filled 2018-10-20: qty 2

## 2018-10-20 MED ORDER — TAMSULOSIN HCL 0.4 MG PO CAPS: 0.4000 mg | ORAL_CAPSULE | Freq: Every day | ORAL | 0 refills | Status: AC

## 2018-10-20 MED ORDER — POTASSIUM CHLORIDE CRYS ER 20 MEQ PO TBCR
40.0000 meq | EXTENDED_RELEASE_TABLET | Freq: Once | ORAL | Status: AC
  Administered 2018-10-20: 40 meq via ORAL
  Filled 2018-10-20: qty 2

## 2018-10-20 NOTE — Discharge Instructions (Signed)

## 2018-10-20 NOTE — Discharge Summary (Signed)
Physician Discharge Summary  [name] [name] ACZ:[id number] DOB: 1951-04-14 DOA: 10/19/2018  PCP: Janith Lima, MD  Admit date: 10/19/2018 Discharge date: 10/20/2018  Admitted From: Home Disposition:  Home  Recommendations for Outpatient Follow-up:  1. Follow up with PCP in 1-2 weeks 2. Please obtain BMP/CBC in one week 3. Please follow up with urology as an outpatient to have your Foley catheter removed. 4. Please hold your losartan to your told by her doctor that you are correct you may restart it.  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  CT scan of the abdomen did not show any acute process.  Patient was noted to have greater than 400 mL's in the bladder and a Foley catheter.  Patient was started on tamsulosin.  Patient's creatinine improved without any IV fluids.  Patient was discharged home.  Patient was told to hold his losartan and to restart this once his creatinine returned to baseline.  #) Pancreatic mass complicated by CBD obstruction status post biliary stents and exchanges: Patient has known pancreatic mass that is currently pending work-up.  He has had a CBD stent.  This was noted to be in position on CT scan done admission.  Patient will follow-up as an outpatient with GI for further management of this.  #) Paroxysmal atrial fibrillation: Patient was continued on beta-blocker and apixaban.  #) Type 2 diabetes: Patient was continued on home insulin regimen, and sliding-scale insulin was used here.  #) Hypertension: Patient was continued on beta-blocker.  His losartan was held and will continue to be held until his creatinine decreased to baseline.  Discharge Diagnoses:  Principal  Problem:   Urinary retention Active Problems:   Essential hypertension, benign   GERD (gastroesophageal reflux disease)   COPD (chronic obstructive pulmonary disease) (HCC)   Cardiomyopathy (HCC)   Hyperlipidemia with target LDL less than 100   Atrial fibrillation (HCC)   Type 2 diabetes mellitus with diabetic nephropathy, with long-term current use of insulin (HCC)   Pancreatic cancer ([place])   Acute kidney injury superimposed on CKD [facility])    Discharge Instructions  Discharge Instructions    Ambulatory referral to Urology   Complete by:  As directed    Call MD for:  difficulty breathing, headache or visual disturbances   Complete by:  As directed    Call MD for:  extreme fatigue   Complete by:  As directed    Call MD for:  hives   Complete by:  As directed    Call MD for:  persistant dizziness or light-headedness   Complete by:  As directed    Call MD for:  persistant nausea and vomiting   Complete by:  As directed    Call MD for:  redness, tenderness, or signs of infection (pain, swelling, redness, odor or green/yellow discharge around incision site)   Complete by:  As directed    Call MD for:  severe uncontrolled pain   Complete by:  As directed    Call MD for:  temperature >100.4   Complete by:  As directed    Diet - low sodium heart healthy   Complete by:  As directed    Discharge instructions   Complete by:  As directed    Please f/u  with your PCP in 1 week. Please f/u with Urology for foley catheter management.   Increase activity slowly   Complete by:  As directed      Allergies as of 10/20/2018      Reactions   Hydralazine Shortness Of Breath   Heart palpitations   Lisinopril Cough   Penicillins Other (See Comments)   [place]ps DID THE REACTION INVOLVE: Swelling of the face/tongue/throat, SOB, or low BP? No Yes Sudden or severe rash/hives, skin peeling, or the inside of the mouth or nose? No Did it require medical treatment? No When did it last  happen?teens  If all above answers are "NO", may proceed with cephalosporin use.   Tribenzor [olmesartan-amlodipine-hctz] Nausea Only   dizziness      Medication List    STOP taking these medications   losartan 100 MG tablet Commonly known as:  COZAAR     TAKE these medications   acetaminophen 500 MG tablet Commonly known as:  TYLENOL Take 1,000 mg by mouth every 6 (six) hours as needed (pain).   albuterol 108 (90 Base) MCG/ACT inhaler Commonly known as:  PROVENTIL HFA;VENTOLIN HFA Inhale 2 puffs into the lungs every 6 (six) hours as needed for wheezing.   alum & mag hydroxide-simeth 200-200-20 MG/5ML suspension Commonly known as:  MAALOX/MYLANTA Take 30 mLs by mouth every 6 (six) hours as needed for indigestion or heartburn.   amLODipine 5 MG tablet Commonly known as:  NORVASC TAKE 1 TABLET BY MOUTH ONCE DAILY   apixaban 5 MG Tabs tablet Commonly known as:  ELIQUIS Take 1 tablet (5 mg total) by mouth 2 (two) times daily.   fish oil-omega-3 fatty acids 1000 MG capsule Take 2 g by mouth daily.   glucose blood test strip Commonly known as:  OneTouch Verio USE AS INSTRUCTED THREE TIMES DAILY. DX CODE E11.21   hydrochlorothiazide 25 MG tablet Commonly known as:  HYDRODIURIL TAKE 1 TABLET BY MOUTH ONCE DAILY   Insulin Detemir 100 UNIT/ML Pen Commonly known as:  Levemir FlexPen Inject 15 units once daily. What changed:    how much to take  how to take this  when to take this  additional instructions   insulin regular 100 units/mL injection Commonly known as:  NovoLIN R ReliOn INJECT 5-8 UNITS SUBCUTANEOUSLY THREE TIMES DAILY before meals What changed:    how much to take  how to take this  when to take this  reasons to take this  additional instructions   Insulin Syringe-Needle U-100 31G X 5/16" 0.3 ML Misc Commonly known as:  Easy Touch Insulin Syringe USE AS DIRECTED 4 TIMES DAILY   metoprolol succinate 50 MG 24 hr tablet Commonly known  as:  TOPROL-XL TAKE 1 TABLET BY MOUTH ONCE DAILY WITH  OR  IMMEDIATELY  FOLLOWING  A  MEAL What changed:    See the new instructions.  Another medication with the same name was removed. Continue taking this medication, and follow the directions you see here.   multivitamin with minerals Tabs tablet Take 1 tablet by mouth daily.   naloxegol oxalate 25 MG Tabs tablet Commonly known as:  Movantik Take 1 tablet (25 mg total) by mouth daily.   omeprazole 20 MG capsule Commonly known as:  PRILOSEC Take 1 capsule (20 mg total) by mouth daily.   tamsulosin 0.4 MG Caps capsule Commonly known as:  FLOMAX Take 1 capsule (0.4 mg total) by mouth daily after supper for 30 days.       Allergies  Allergen Reactions  . Hydralazine Shortness Of Breath    Heart palpitations  . Lisinopril Cough  . Penicillins Other (See Comments)    [place] DID THE REACTION INVOLVE: Swelling of the face/tongue/throat, SOB, or low BP? No Yes Sudden or severe rash/hives, skin peeling, or the inside of the mouth or nose? No Did it require medical treatment? No When did it last happen?teens  If all above answers are "NO", may proceed with cephalosporin use.  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 CBD stent placed January 2020. Abdominal pain. EXAM: CT ABDOMEN AND PELVIS WITHOUT CONTRAST TECHNIQUE: Multidetector CT imaging of the abdomen and pelvis was performed following the standard protocol without IV contrast. COMPARISON:  09/10/2018 CT abdomen. 07/28/2018 MRI abdomen. 07/27/2018 unenhanced CT abdomen/pelvis. FINDINGS: Lower chest: No significant pulmonary nodules or acute consolidative airspace disease. Coronary atherosclerosis. Hepatobiliary: Normal liver size. No liver mass. Pneumobilia  throughout the mildly dilated left liver lobe intrahepatic bile ducts, unchanged. Pneumobilia in the nondistended gallbladder with no radiopaque cholelithiasis. No gallbladder wall thickening or pericholecystic fluid. Reflux of oral contrast into right liver lobe bile ducts. Well-positioned CBD stent with pneumobilia in CBD. Distal CBD stent tip in the descending duodenal lumen. Pancreas: Poorly marginated pancreatic head 5.6 x 3.0 cm mass (series 2/image 31), previously 5.7 x 3.2 cm on 09/10/2018 CT, not appreciably changed. Stable mild pancreatic duct dilation in the pancreatic body and tail. Progression of pancreatic parenchymal atrophy in the pancreatic body and tail. Spleen: Atrophic spleen.  No splenic mass. Adrenals/Urinary Tract: Normal adrenals. No renal stones. No hydronephrosis. Small simple bilateral renal cysts, largest 1.8 cm in the medial upper right kidney. No new contour deforming renal lesions. Bladder collapsed by indwelling Foley catheter. Expected gas within the nondependent bladder lumen due to instrumentation. Stomach/Bowel: Normal non-distended stomach. Normal caliber small bowel with no small bowel wall thickening. Normal appendix. Oral contrast transits to the right colon. Stable postsurgical changes from subtotal distal colectomy with intact appearing distal colonic anastomosis. Moderate diffuse diverticulosis in the remnant large-bowel, with no acute large bowel wall thickening or acute pericolonic fat stranding. Vascular/Lymphatic: Atherosclerotic nonaneurysmal abdominal aorta. Similar soft tissue at least partially encasing the proximal SMA and celiac trunk. No pathologically enlarged lymph nodes in the abdomen or pelvis. Reproductive: Moderate prostatomegaly. Other: No pneumoperitoneum, ascites or focal fluid collection. Stable moderate fat containing supraumbilical midline ventral abdominal hernia. Musculoskeletal: No aggressive appearing focal osseous lesions. Moderate thoracolumbar  spondylosis. IMPRESSION: 1. Locally advanced pancreatic head neoplasm appears stable. Progressive parenchymal atrophy in the pancreatic body and tail. 2. Stable well-positioned patent CBD stent. 3. No findings of metastatic disease on this noncontrast CT. 4. No acute abnormality. No evidence of bowel obstruction or acute bowel inflammation. Moderate diffuse colonic diverticulosis, with no noncontrast CT findings of acute diverticulitis. 5. Moderate prostatomegaly. Bladder decompressed by indwelling Foley catheter. 6. Stable moderate fat containing supraumbilical midline ventral abdominal hernia. 7.  Aortic Atherosclerosis (ICD10-I70.0). Electronically Signed   By: Ilona Sorrel M.D.   On: 10/19/2018 14:59      Subjective:   Discharge Exam: Vitals:   10/19/18 2150 10/20/18 0447  BP: 126/79 121/73  Pulse: 73 63  Resp: 16 16  Temp: 98.9 F (37.2 C) 98.7 F (37.1 C)  SpO2: 99% 97%   Vitals:   10/19/18 1736 10/19/18 2150 10/20/18 0446  10/20/18 0447  BP: (!) 152/82 126/79  121/73  Pulse:  73  63  Resp:  16  16  Temp: 98.5 F (36.9 C) 98.9 F (37.2 C)  98.7 F (37.1 C)  TempSrc: Oral Oral  Oral  SpO2:  99%  97%  Weight:   68.2 kg   Height:        General: Pt is alert, awake, not in acute distress Cardiovascular: RRR, S1/S2 +, no rubs, no gallops Respiratory: CTA bilaterally, no wheezing, no rhonchi Abdominal: Soft, NT, ND, bowel sounds + Extremities: no edema    The results of significant diagnostics from this hospitalization (including imaging, microbiology, ancillary and laboratory) are listed below for reference.     Microbiology: Recent Results (from the past 240 hour(s))  Urine culture     Status: None   Collection Time: 10/19/18 11:20 AM  Result Value Ref Range Status   Specimen Description   Final    URINE, CLEAN CATCH Performed at [facility], [place] [address], [place], [place] [address]    Special Requests   Final    NONE Performed at [facility], [place] [address], [place], [place] [address]    Culture   Final    NO GROWTH Performed at [facility], [place] [address], [place], [place] [address]    Report Status 10/20/2018 FINAL  Final     Labs: BNP (last 3 results) No results for input(s): BNP in the last 8760 hours. Basic Metabolic Panel: Recent Labs  Lab 10/19/18 1120 10/20/18 0435  NA 136 136  K 3.6 3.1*  CL 94* 98  CO2 25 27  GLUCOSE 141* 111*  BUN 39* 35*  CREATININE 2.55* 2.12*  CALCIUM 9.7 9.0   Liver Function Tests: Recent Labs  Lab 10/19/18 1120  AST 27  ALT 17  ALKPHOS 84  BILITOT 2.3*  PROT 8.4*  ALBUMIN 4.3   Recent Labs  Lab 10/19/18 1120  LIPASE 73*   No results for input(s): AMMONIA in the last 168 hours. CBC: Recent Labs  Lab 10/19/18 1120  WBC 6.5  NEUTROABS 4.9  HGB 12.6*  HCT 39.3  MCV 96.8  PLT 458*   Cardiac Enzymes: No results for input(s): CKTOTAL, CKMB, CKMBINDEX, TROPONINI in the last 168 hours. BNP: Invalid input(s): POCBNP CBG: Recent Labs  Lab 10/19/18 1742 10/19/18 2146 10/20/18 0729  GLUCAP 95 116* 125*   D-Dimer No results for input(s): DDIMER in the last 72 hours. Hgb A1c No results for input(s): HGBA1C in the last 72 hours. Lipid Profile No results for input(s): CHOL, HDL, LDLCALC, TRIG, CHOLHDL, LDLDIRECT in the last 72 hours. Thyroid function studies No results for input(s): TSH, T4TOTAL, T3FREE, THYROIDAB in the last 72 hours.  Invalid input(s): FREET3 Anemia work up No results for input(s): VITAMINB12, FOLATE, FERRITIN, TIBC, IRON, RETICCTPCT in the last 72 hours. Urinalysis    Component Value Date/Time   COLORURINE YELLOW 10/19/2018 1120   APPEARANCEUR CLEAR 10/19/2018 1120   LABSPEC 1.014 10/19/2018 1120   PHURINE 6.0 10/19/2018 1120   GLUCOSEU NEGATIVE 10/19/2018 1120   GLUCOSEU NEGATIVE 08/19/2018 1357   HGBUR NEGATIVE 10/19/2018 1120   BILIRUBINUR NEGATIVE 10/19/2018 1120   KETONESUR 5 (A) 10/19/2018  1120   PROTEINUR NEGATIVE 10/19/2018 1120   UROBILINOGEN 0.2 08/19/2018 1357   NITRITE NEGATIVE 10/19/2018 1120   LEUKOCYTESUR NEGATIVE 10/19/2018 1120   Sepsis Labs Invalid input(s): PROCALCITONIN,  WBC,  LACTICIDVEN Microbiology Recent Results (from the past  240 hour(s))  Urine culture     Status: None   Collection Time: 10/19/18 11:20 AM  Result Value Ref Range Status   Specimen Description   Final    URINE, CLEAN CATCH Performed at [facility], [place] [address], [place], [place] [address]    Special Requests   Final    NONE Performed at [facility], [place] [address] [place], [place] [address]    Culture   Final    NO GROWTH Performed at [facility], [place] [address], [place], [place] [address]    Report Status 10/20/2018 FINAL  Final     Time coordinating discharge: 69  SIGNED:   Cristy Folks, MD  [facility] 10/20/2018, 10:03 AM  If 7PM-7AM, please contact night-coverage [email or web address] Password [email or web address]

## 2018-10-20 NOTE — Progress Notes (Signed)
Initial Nutrition Assessment RD working remotely.   DOCUMENTATION CODES:   Not applicable  INTERVENTION:  - Continue Ensure Enlive BID, each supplement provides 350 kcal and 20 grams of protein. - Continue to encourage PO intakes.    NUTRITION DIAGNOSIS:   Increased nutrient needs related to catabolic illness, cancer and cancer related treatments as evidenced by estimated needs.  GOAL:   Patient will meet greater than or equal to 90% of their needs  MONITOR:   PO intake, Supplement acceptance, Weight trends, Labs  REASON FOR ASSESSMENT:   Malnutrition Screening Tool  ASSESSMENT:   68 y.o. male with medical history significant of recently diagnosed pancreatic cancer, atrial fibrillation, asthma, GERD, HTN, type 2 DM on insulin, and history of BPH. He presented to the ED with lower abdominal pain, constipation, and inability to urinate.   BMI indicates normal weight. Per review of RN flow sheet, patient consumed 50% of dinner last night and 75% of breakfast this AM. Ensure Enlive was ordered BID per ONS protocol at the time of admission and patient accepted bottle offered this AM.   Per chart review, weight today is 150 lb and weight on 3/12 was 165 lb. This indicates 15 lb weight loss (9% body weight) in 2 weeks; unsure of accuracy of this. Weight on 2/4 was 173 lb which indicates 8 lb weight loss (4.6% body weight) from 2/4-3/12; not significant for time frame.   Discharge order and discharge summary noted to already be entered.    Medications reviewed; 8 units novolog TID, 15 units levemir/day, daily multivitamin with minerals, 2 g lovaza/day, 40 mEq K-Dur x1 dose today.  Labs reviewed; CBGs: 125 and 75 mg/dl today, K: 3.1 mmol/l, BUN: 35 mg/d, creatinine: 2.12 mg/dl, GFR: 36 ml/min.      NUTRITION - FOCUSED PHYSICAL EXAM:  Unable to complete at this time.   Diet Order:   Diet Order            Diet - low sodium heart healthy        Diet heart healthy/carb  modified Room service appropriate? Yes; Fluid consistency: Thin  Diet effective now              EDUCATION NEEDS:   No education needs have been identified at this time  Skin:  Skin Assessment: Reviewed RN Assessment  Last BM:  3/22  Height:   Ht Readings from Last 1 Encounters:  10/19/18 5\' 10"  (1.778 m)    Weight:   Wt Readings from Last 1 Encounters:  10/20/18 68.2 kg    Ideal Body Weight:  75.45 kg  BMI:  Body mass index is 21.57 kg/m.  Estimated Nutritional Needs:   Kcal:  8416-6063 kcal  Protein:  110-125 grams  Fluid:  >/= 2.1 L/day     Jarome Matin, MS, RD, LDN, [facility] Inpatient Clinical Dietitian Pager # [phone] After hours/weekend pager # [phone]

## 2018-10-21 ENCOUNTER — Telehealth: Payer: Self-pay | Admitting: *Deleted

## 2018-10-21 NOTE — Telephone Encounter (Signed)
I think it should be an OV

## 2018-10-21 NOTE — Telephone Encounter (Signed)
Called pt there was no answer LMOM RTC...lmb 

## 2018-10-21 NOTE — Telephone Encounter (Signed)
Patient was admitted 10/19/18 with lower abdominal/groin pain and symptoms of lower urinary tract.  CT scan of the abdomen did not show any acute process.  Patient was noted to have greater than 400 mL's in the bladder and a Foley catheter.  Patient was started on tamsulosin.  Patient's creatinine improved without any IV fluids.  Patient was discharged home 10/20/18, and was told to hold his losartan and see his PCP within 7-14 days consider to restart losartan once his creatinine returned to baseline. Would you like to see pt in the office or virtual visit.Marland KitchenJohny Chess

## 2018-10-22 ENCOUNTER — Inpatient Hospital Stay: Payer: Medicare Other | Attending: Oncology | Admitting: Oncology

## 2018-10-22 ENCOUNTER — Telehealth: Payer: Self-pay | Admitting: Oncology

## 2018-10-22 ENCOUNTER — Other Ambulatory Visit: Payer: Self-pay

## 2018-10-22 VITALS — BP 112/58 | HR 85 | Temp 97.9°F | Resp 20 | Ht 70.0 in | Wt 150.7 lb

## 2018-10-22 DIAGNOSIS — R630 Anorexia: Secondary | ICD-10-CM | POA: Diagnosis not present

## 2018-10-22 DIAGNOSIS — I4891 Unspecified atrial fibrillation: Secondary | ICD-10-CM | POA: Diagnosis not present

## 2018-10-22 DIAGNOSIS — C250 Malignant neoplasm of head of pancreas: Secondary | ICD-10-CM | POA: Insufficient documentation

## 2018-10-22 DIAGNOSIS — R42 Dizziness and giddiness: Secondary | ICD-10-CM | POA: Insufficient documentation

## 2018-10-22 DIAGNOSIS — R339 Retention of urine, unspecified: Secondary | ICD-10-CM

## 2018-10-22 DIAGNOSIS — F17210 Nicotine dependence, cigarettes, uncomplicated: Secondary | ICD-10-CM | POA: Insufficient documentation

## 2018-10-22 DIAGNOSIS — Z86010 Personal history of colonic polyps: Secondary | ICD-10-CM | POA: Diagnosis not present

## 2018-10-22 DIAGNOSIS — J449 Chronic obstructive pulmonary disease, unspecified: Secondary | ICD-10-CM | POA: Insufficient documentation

## 2018-10-22 DIAGNOSIS — K831 Obstruction of bile duct: Secondary | ICD-10-CM | POA: Diagnosis not present

## 2018-10-22 DIAGNOSIS — I10 Essential (primary) hypertension: Secondary | ICD-10-CM | POA: Diagnosis not present

## 2018-10-22 DIAGNOSIS — I429 Cardiomyopathy, unspecified: Secondary | ICD-10-CM | POA: Insufficient documentation

## 2018-10-22 DIAGNOSIS — N289 Disorder of kidney and ureter, unspecified: Secondary | ICD-10-CM | POA: Diagnosis not present

## 2018-10-22 DIAGNOSIS — Z8042 Family history of malignant neoplasm of prostate: Secondary | ICD-10-CM | POA: Insufficient documentation

## 2018-10-22 DIAGNOSIS — E119 Type 2 diabetes mellitus without complications: Secondary | ICD-10-CM | POA: Diagnosis not present

## 2018-10-22 DIAGNOSIS — K219 Gastro-esophageal reflux disease without esophagitis: Secondary | ICD-10-CM | POA: Insufficient documentation

## 2018-10-22 DIAGNOSIS — Z803 Family history of malignant neoplasm of breast: Secondary | ICD-10-CM | POA: Insufficient documentation

## 2018-10-22 DIAGNOSIS — R634 Abnormal weight loss: Secondary | ICD-10-CM | POA: Insufficient documentation

## 2018-10-22 NOTE — Progress Notes (Signed)
Reports checking his CBG tid at home with range of 90-115. Reports significant weight loss over past few months--no appetite

## 2018-10-22 NOTE — Progress Notes (Signed)
[name] [name] Patient Consult   Requesting MD: Luie Laneve 68 y.o.  [birth year]    Reason for Consult: Pancreas cancer   HPI: [name] [name] reports developing back pain and jaundice in December 2019.  He felt he had a kidney stone.  A CT renal stone study was negative.  He was admitted.  An MRI of the abdomen on 07/28/2018 revealed a pancreas head mass and biliary dilatation.  He underwent an ERCP on 07/30/2018.  A malignant appearing biliary stricture was noted in the lower bile duct.  A plastic stent was placed in the common bile duct.  He had persistent jaundice and was taken to a repeat ERCP and EUS Dr. Ardis Hughs on 08/13/2018.  The previously placed plastic stent had migrated and was removed.  Fully covered stent was placed.  The mass could not be completely evaluated secondary to artifact from the stent.  The mass was biopsied.  The cytology from the 07/30/2018 and 08/13/2018 procedures are nondiagnostic of malignancy.  A CT of the abdomen and pelvis on 09/10/2018 revealed an ill-defined mass in the head of the pancreas measuring 5.7 x 3.2 x 5.6 cm with dilatation of the main pancreatic duct.  Mass contacts the splenoportal confluence and proximal portal vein without narrowing.  The medial aspect of the lesion is in direct contact with the superior mesenteric artery.  There is partial encasement of the superior mesenteric artery.  There is soft tissue adjacent to the celiac axis.  He was taken to a repeat EUS by Dr. Ardis Hughs on 10/08/2018.  The pancreas mass could not be evaluated secondary to artifact from the stent.  The mass was biopsied.  The cytology ([id number]) revealed adenocarcinoma.  He reports developing urinary retention's subsequent to this procedure and underwent placement of a Foley catheter.  The catheter remains in place.  He is scheduled for a urology appointment next week.  He is taking Flomax.  He feels "dizzy "today.  Past Medical History:   Diagnosis Date  . Allergy   . Asthma   . Atrial fibrillation ([place])    a. initially occuring in 2014, has declined anticoagulation since having a GI bleed with initiation of Xarelto  .  Pancreas cancer  December 2019  . Cardiomyopathy 2011   a. Patient was told that he had an ejection fraction of 37% by echo done elsewhere in 2011 b. 06/2014: echo showing preserved EF of 55-60%, severe asymetric septal hypertrophy with no SAM or LVOT gradient  . COPD (chronic obstructive pulmonary disease) ([place])   . Diabetes mellitus without complication ([place])   . Diverticulitis   . GERD (gastroesophageal reflux disease)   . History of colon polyps   . Hypertension   . Tobacco abuse     Past Surgical History:  Procedure Laterality Date  . BILIARY STENT PLACEMENT  07/30/2018   Procedure: BILIARY STENT PLACEMENT;  Surgeon: Ladene Artist, MD;  Location: [facility] ENDOSCOPY;  Service: Endoscopy;;  . BILIARY STENT PLACEMENT N/A 08/13/2018   Procedure: BILIARY STENT PLACEMENT;  Surgeon: Milus Banister, MD;  Location: [facility] ENDOSCOPY;  Service: Endoscopy;  Laterality: N/A;  . COLON SURGERY  2009  . COLONOSCOPY    . ERCP    . ERCP N/A 07/30/2018   Procedure: ENDOSCOPIC RETROGRADE CHOLANGIOPANCREATOGRAPHY (ERCP);  Surgeon: Ladene Artist, MD;  Location: [facility] ENDOSCOPY;  Service: Endoscopy;  Laterality: N/A;  . ERCP N/A 08/13/2018   Procedure: ENDOSCOPIC RETROGRADE CHOLANGIOPANCREATOGRAPHY (ERCP);  Surgeon: Milus Banister,  MD;  Location: [facility] ENDOSCOPY;  Service: Endoscopy;  Laterality: N/A;  . ESOPHAGOGASTRODUODENOSCOPY N/A 08/13/2018   Procedure: ESOPHAGOGASTRODUODENOSCOPY (EGD);  Surgeon: Milus Banister, MD;  Location: [facility] ENDOSCOPY;  Service: Endoscopy;  Laterality: N/A;  . ESOPHAGOGASTRODUODENOSCOPY (EGD) WITH PROPOFOL N/A 10/08/2018   Procedure: ESOPHAGOGASTRODUODENOSCOPY (EGD) WITH PROPOFOL;  Surgeon: Milus Banister, MD;  Location: [facility] ENDOSCOPY;  Service: Endoscopy;  Laterality: N/A;  . EUS N/A 08/13/2018    Procedure: UPPER ENDOSCOPIC ULTRASOUND (EUS) RADIAL;  Surgeon: Milus Banister, MD;  Location: [facility] ENDOSCOPY;  Service: Endoscopy;  Laterality: N/A;  . EUS N/A 10/08/2018   Procedure: UPPER ENDOSCOPIC ULTRASOUND (EUS) RADIAL;  Surgeon: Milus Banister, MD;  Location: [facility] ENDOSCOPY;  Service: Endoscopy;  Laterality: N/A;  . FINE NEEDLE ASPIRATION N/A 08/13/2018   Procedure: FINE NEEDLE ASPIRATION (FNA) LINEAR;  Surgeon: Milus Banister, MD;  Location: [facility] ENDOSCOPY;  Service: Endoscopy;  Laterality: N/A;  . FINE NEEDLE ASPIRATION  10/08/2018   Procedure: FINE NEEDLE ASPIRATION;  Surgeon: Milus Banister, MD;  Location: [facility] ENDOSCOPY;  Service: Endoscopy;;  . Lavell Islam REMOVAL  08/13/2018   Procedure: STENT REMOVAL;  Surgeon: Milus Banister, MD;  Location: [facility] ENDOSCOPY;  Service: Endoscopy;;     .  Surgery for diverticulitis Medications: Reviewed  Allergies:  Allergies  Allergen Reactions  . Hydralazine Shortness Of Breath    Heart palpitations  . Lisinopril Cough  . Penicillins Other (See Comments)    [place] DID THE REACTION INVOLVE: Swelling of the face/tongue/throat, SOB, or low BP? No Yes Sudden or severe rash/hives, skin peeling, or the inside of the mouth or nose? No Did it require medical treatment? No When did it last happen?teens  If all above answers are "NO", may proceed with cephalosporin use.  Marland Kitchen Tribenzor [Olmesartan-Amlodipine-Hctz] Nausea Only    dizziness    Family history: His father died of prostate cancer.  His brother had prostate cancer.  His mother had breast cancer.  No other family history of cancer  Social History:   He lives alone in [place].  He is retired Futures trader.  He does not use tobacco.  He reports rare alcohol use.  No transfusion history.  No risk factor for HIV or hepatitis.  He has a son in [place] and a daughter in [place].  ROS:   Positives include: Anorexia, 40 pound weight loss, urinary retention requiring placement of a  Foley catheter 10/20/2018, "dizzy "today  A complete ROS was otherwise negative.  Physical Exam:  Blood pressure (!) 112/58, pulse 85, temperature 97.9 F (36.6 C), temperature source Oral, resp. rate 20, height 5\' 10"  (1.778 m), weight 150 lb 11.2 oz (68.4 kg).  HEENT: Multiple missing teeth, oral cavity without visible mass Lungs: Clear bilaterally, no respiratory distress Cardiac: Regular rhythm Abdomen: No hepatosplenomegaly, nontender, no mass  Vascular: No leg edema Lymph nodes: No cervical, supraclavicular, axillary, or inguinal nodes Neurologic: Alert and oriented, the motor exam appears grossly intact, he was able to ambulate to the examination table Skin: No rash Musculoskeletal: No spine tenderness   LAB:  CBC  Lab Results  Component Value Date   WBC 6.5 10/19/2018   HGB 12.6 (L) 10/19/2018   HCT 39.3 10/19/2018   MCV 96.8 10/19/2018   PLT 458 (H) 10/19/2018   NEUTROABS 4.9 10/19/2018        CMP  Lab Results  Component Value Date   NA 136 10/20/2018   K 3.1 (L) 10/20/2018   CL 98 10/20/2018  CO2 27 10/20/2018   GLUCOSE 111 (H) 10/20/2018   BUN 35 (H) 10/20/2018   CREATININE 2.12 (H) 10/20/2018   CALCIUM 9.0 10/20/2018   PROT 8.4 (H) 10/19/2018   ALBUMIN 4.3 10/19/2018   AST 27 10/19/2018   ALT 17 10/19/2018   ALKPHOS 84 10/19/2018   BILITOT 2.3 (H) 10/19/2018   GFRNONAA 31 (L) 10/20/2018   GFRAA 36 (L) 10/20/2018     Imaging:  CT images from 09/10/2018-reviewed   Assessment/Plan:   1. Pancreas cancer clinical stage III (cT4,cN0), adenocarcinoma on FNA biopsy of pancreas mass 10/08/2018  Presenting with obstructive jaundice December 2019  MRI abdomen 07/28/2018- bile duct obstruction secondary to pancreas head mass  ERCP/bile duct stent 07/30/2018  Repeat ERCP with placement of covered stent 08/13/2018  CT on 09/10/2018-pancreas mass associated with superior mesenteric artery-partially encased, increased soft tissue adjacent to the celiac  axis  EUS biopsy 10/08/2018- adenocarcinoma 2.  Diabetes 3.  Renal insufficiency 4.  COPD 5.  History of diverticulitis 6.  Cardiomyopathy 7.  History of atrial fibrillation 8.  History of hypertension 9.  Anorexia/weight loss secondary to #1 10. Obstructive jaundice secondary to #1, status post placement of a bile duct stent Disposition:   [name] [name] has been diagnosed with locally advanced pancreas cancer.  I discussed the prognosis and reviewed the imaging results with [name] [name].  He appears to have locally advanced and unresectable pancreas cancer.  He understands no therapy will be curative.  We discussed treatment options including comfort/supportive care versus a trial of systemic therapy.  We also discussed radiation therapy.  I recommend a trial of chemotherapy if his performance status improves.  He plans to see urology to consider removal of the Foley catheter next week.  He is also scheduled to see his primary physician next week.  He may be able to consolidate the medical regimen for diabetes and hypertension given the recent weight loss.  The "dizziness "he experienced in the office today may be related to polypharmacy including Flomax.  The goals of chemotherapy is to palliate his symptoms and potentially extend survival.  His performance status does not appear adequate to receive FOLFIRINOX.  I will recommend gemcitabine/Abraxane if his performance status has improved when he returns next week.  We discussed the need for Port-A-Cath placement prior to chemotherapy.  [name] [name] will see Dr. Ronnald Ramp prior to a return visit here 10/29/2018.  Betsy Coder, MD  10/22/2018, 2:39 PM

## 2018-10-22 NOTE — Telephone Encounter (Signed)
Scheduled appt per 3/26 los. ° °Printed calendar and avs. °

## 2018-10-22 NOTE — Telephone Encounter (Signed)
Transition Care Management Follow-up Telephone Call   Date discharged? 10/20/18   How have you been since you were released from the hospital? Pt states he is doing fine   Do you understand why you were in the hospital? YES   Do you understand the discharge instructions? YES   Where were you discharged to? Home   Items Reviewed:  Medications reviewed: YES, BP med was stopped until he see Dr. Ronnald Ramp. Ask pt if he has been checking BP pt states its been running in the 130's last check this am 138/72  Allergies reviewed: YES  Dietary changes reviewed: YES  Referrals reviewed: YES, he states he have appt next Tues 10/27/18 w/his uologist   Functional Questionnaire:   Activities of Daily Living (ADLs):   He states he are independent in the following: ambulation, bathing and hygiene, feeding, grooming, toileting and dressing States they require assistance with the following: continence currently has a foley catheter in place   Any transportation issues/concerns?: NO   Any patient concerns? NO   Confirmed importance and date/time of follow-up visits scheduled: YES appt 10/26/18   Provider Appointment booked with Dr. Ronnald Ramp  Confirmed with patient if condition begins to worsen call PCP or go to the ER.  Patient was given the office number and encouraged to call back with question or concerns.  : YES

## 2018-10-22 NOTE — Patient Instructions (Signed)
Coronavirus (COVID-19) Are you at risk?  Are you at risk for the Coronavirus (COVID-19)?  To be considered HIGH RISK for Coronavirus (COVID-19), you have to meet the following criteria:  . Traveled to [place], [place], [place], [place] or [place]; or in the [place] to [place], [place], [place], or [place]; and have fever, cough, and shortness of breath within the last 2 weeks of travel OR . Been in close contact with a person diagnosed with COVID-19 within the last 2 weeks and have fever, cough, and shortness of breath . IF YOU DO NOT MEET THESE CRITERIA, YOU ARE CONSIDERED LOW RISK FOR COVID-19.  What to do if you are HIGH RISK for COVID-19?  . If you are having a medical emergency, call 911. . Seek medical care right away. Before you go to a doctor's office, urgent care or emergency department, call ahead and tell them about your recent travel, contact with someone diagnosed with COVID-19, and your symptoms. You should receive instructions from your physician's office regarding next steps of care.  . When you arrive at healthcare provider, tell the healthcare staff immediately you have returned from visiting [place], [place], [place], [place] or [place]; or traveled in the [place] to [place], [place], [place], or [place]; in the last two weeks or you have been in close contact with a person diagnosed with COVID-19 in the last 2 weeks.   . Tell the health care staff about your symptoms: fever, cough and shortness of breath. . After you have been seen by a medical provider, you will be either: o Tested for (COVID-19) and discharged home on quarantine except to seek medical care if symptoms worsen, and asked to  - Stay home and avoid contact with others until you get your results (4-5 days)  - Avoid travel on public transportation if possible (such as bus, train, or airplane) or o Sent to the Emergency Department by EMS for evaluation, COVID-19 testing, and possible  admission depending on your condition and test results.  What to do if you are LOW RISK for COVID-19?  Reduce your risk of any infection by using the same precautions used for avoiding the common cold or flu:  . Wash your hands often with soap and warm water for at least 20 seconds.  If soap and water are not readily available, use an alcohol-based hand sanitizer with at least 60% alcohol.  . If coughing or sneezing, cover your mouth and nose by coughing or sneezing into the elbow areas of your shirt or coat, into a tissue or into your sleeve (not your hands). . Avoid shaking hands with others and consider head nods or verbal greetings only. . Avoid touching your eyes, nose, or mouth with unwashed hands.  . Avoid close contact with people who are sick. . Avoid places or events with large numbers of people in one location, like concerts or sporting events. . Carefully consider travel plans you have or are making. . If you are planning any travel outside or inside the [place], visit the CDC's Travelers' Health webpage for the latest health notices. . If you have some symptoms but not all symptoms, continue to monitor at home and seek medical attention if your symptoms worsen. . If you are having a medical emergency, call 911.   ADDITIONAL HEALTHCARE OPTIONS FOR PATIENTS  [facility] / e-Visit: [email or web address]         [facility]: [phone]  [facility]: [phone]                   [facility]: [phone]   

## 2018-10-26 ENCOUNTER — Inpatient Hospital Stay: Payer: Medicare Other | Admitting: Internal Medicine

## 2018-10-27 ENCOUNTER — Other Ambulatory Visit (INDEPENDENT_AMBULATORY_CARE_PROVIDER_SITE_OTHER): Payer: Medicare Other

## 2018-10-27 ENCOUNTER — Ambulatory Visit (INDEPENDENT_AMBULATORY_CARE_PROVIDER_SITE_OTHER): Payer: Medicare Other | Admitting: Internal Medicine

## 2018-10-27 ENCOUNTER — Encounter: Payer: Self-pay | Admitting: Internal Medicine

## 2018-10-27 ENCOUNTER — Other Ambulatory Visit: Payer: Self-pay

## 2018-10-27 VITALS — BP 114/76 | HR 64 | Temp 97.4°F | Resp 18 | Ht 70.0 in | Wt 148.0 lb

## 2018-10-27 DIAGNOSIS — D539 Nutritional anemia, unspecified: Secondary | ICD-10-CM | POA: Insufficient documentation

## 2018-10-27 DIAGNOSIS — N183 Chronic kidney disease, stage 3 unspecified: Secondary | ICD-10-CM

## 2018-10-27 DIAGNOSIS — I10 Essential (primary) hypertension: Secondary | ICD-10-CM

## 2018-10-27 DIAGNOSIS — R338 Other retention of urine: Secondary | ICD-10-CM | POA: Diagnosis not present

## 2018-10-27 DIAGNOSIS — C250 Malignant neoplasm of head of pancreas: Secondary | ICD-10-CM

## 2018-10-27 DIAGNOSIS — R64 Cachexia: Secondary | ICD-10-CM | POA: Diagnosis not present

## 2018-10-27 DIAGNOSIS — Z794 Long term (current) use of insulin: Secondary | ICD-10-CM

## 2018-10-27 DIAGNOSIS — E1121 Type 2 diabetes mellitus with diabetic nephropathy: Secondary | ICD-10-CM | POA: Diagnosis not present

## 2018-10-27 LAB — CBC WITH DIFFERENTIAL/PLATELET
Basophils Absolute: 0.1 10*3/uL (ref 0.0–0.1)
Basophils Relative: 0.7 % (ref 0.0–3.0)
Eosinophils Absolute: 0.2 10*3/uL (ref 0.0–0.7)
Eosinophils Relative: 2.2 % (ref 0.0–5.0)
HCT: 36 % — ABNORMAL LOW (ref 39.0–52.0)
Hemoglobin: 12.1 g/dL — ABNORMAL LOW (ref 13.0–17.0)
LYMPHS PCT: 20.2 % (ref 12.0–46.0)
Lymphs Abs: 1.7 10*3/uL (ref 0.7–4.0)
MCHC: 33.6 g/dL (ref 30.0–36.0)
MCV: 94.6 fl (ref 78.0–100.0)
Monocytes Absolute: 0.5 10*3/uL (ref 0.1–1.0)
Monocytes Relative: 6.2 % (ref 3.0–12.0)
Neutro Abs: 5.8 10*3/uL (ref 1.4–7.7)
Neutrophils Relative %: 70.7 % (ref 43.0–77.0)
Platelets: 402 10*3/uL — ABNORMAL HIGH (ref 150.0–400.0)
RBC: 3.81 Mil/uL — ABNORMAL LOW (ref 4.22–5.81)
RDW: 13.1 % (ref 11.5–15.5)
WBC: 8.2 10*3/uL (ref 4.0–10.5)

## 2018-10-27 LAB — POCT GLYCOSYLATED HEMOGLOBIN (HGB A1C): Hemoglobin A1C: 6.6 % — AB (ref 4.0–5.6)

## 2018-10-27 LAB — IBC PANEL
Iron: 51 ug/dL (ref 42–165)
Saturation Ratios: 19.3 % — ABNORMAL LOW (ref 20.0–50.0)
Transferrin: 189 mg/dL — ABNORMAL LOW (ref 212.0–360.0)

## 2018-10-27 LAB — FERRITIN: Ferritin: 296.6 ng/mL (ref 22.0–322.0)

## 2018-10-27 LAB — VITAMIN B12: Vitamin B-12: 391 pg/mL (ref 211–911)

## 2018-10-27 LAB — BASIC METABOLIC PANEL
BUN: 28 mg/dL — AB (ref 6–23)
CO2: 28 mEq/L (ref 19–32)
Calcium: 9.6 mg/dL (ref 8.4–10.5)
Chloride: 99 mEq/L (ref 96–112)
Creatinine, Ser: 1.78 mg/dL — ABNORMAL HIGH (ref 0.40–1.50)
GFR: 45.99 mL/min — ABNORMAL LOW (ref >60.00)
Glucose, Bld: 122 mg/dL — ABNORMAL HIGH (ref 70–99)
Potassium: 3.5 mEq/L (ref 3.5–5.1)
Sodium: 138 mEq/L (ref 135–145)

## 2018-10-27 LAB — FOLATE: Folate: 8.4 ng/mL (ref >5.9)

## 2018-10-27 MED ORDER — INSULIN DETEMIR 100 UNIT/ML FLEXPEN: 10.0000 [IU] | PEN_INJECTOR | Freq: Every day | SUBCUTANEOUS | 1 refills | Status: DC

## 2018-10-27 MED ORDER — DRONABINOL 2.5 MG PO CAPS: 2.5000 mg | ORAL_CAPSULE | Freq: Two times a day (BID) | ORAL | 0 refills | Status: DC

## 2018-10-27 NOTE — Patient Instructions (Signed)

## 2018-10-27 NOTE — Progress Notes (Signed)
Subjective:  Patient ID: [name] [name], male    DOB: October 23, 1950  Age: 68 y.o. MRN: [id number]  CC: Hypertension and Diabetes   HPI [name] presents for f/up - Since I last saw him he has been diagnosed with adenocarcinoma of the pancreas.  He is in the process of deciding on treatment options.  About a week ago he was seen in the ED for urinary retention and a foley catheter was placed.  He has followed up with a urologist and the catheter has been removed.  He is passing his urine well.  He complains of loss of appetite and weight loss.  He also thinks his blood pressure has been well controlled but he complains of lightheadedness.  Outpatient Medications Prior to Visit  Medication Sig Dispense Refill   acetaminophen (TYLENOL) 500 MG tablet Take 1,000 mg by mouth every 6 (six) hours as needed (pain).     albuterol (PROVENTIL HFA;VENTOLIN HFA) 108 (90 Base) MCG/ACT inhaler Inhale 2 puffs into the lungs every 6 (six) hours as needed for wheezing. 18 g 5   alum & mag hydroxide-simeth (MAALOX/MYLANTA) 200-200-20 MG/5ML suspension Take 30 mLs by mouth every 6 (six) hours as needed for indigestion or heartburn.     amLODipine (NORVASC) 5 MG tablet TAKE 1 TABLET BY MOUTH ONCE DAILY (Patient taking differently: Take 5 mg by mouth daily. ) 90 tablet 1   apixaban (ELIQUIS) 5 MG TABS tablet Take 1 tablet (5 mg total) by mouth 2 (two) times daily. 60 tablet 6   fish oil-omega-3 fatty acids 1000 MG capsule Take 2 g by mouth daily.      glucose blood (ONETOUCH VERIO) test strip USE AS INSTRUCTED THREE TIMES DAILY. DX CODE E11.21 100 each 11   insulin regular (NOVOLIN R RELION) 100 units/mL injection INJECT 5-8 UNITS SUBCUTANEOUSLY THREE TIMES DAILY before meals (Patient taking differently: Inject 5-8 Units into the skin 3 (three) times daily as needed for high blood sugar (CBG >120). ) 10 mL 3   Insulin Syringe-Needle U-100 (EASY TOUCH INSULIN SYRINGE) 31G X 5/16" 0.3 ML MISC USE AS  DIRECTED 4 TIMES DAILY 200 each 4   metoprolol succinate (TOPROL-XL) 50 MG 24 hr tablet TAKE 1 TABLET BY MOUTH ONCE DAILY WITH  OR  IMMEDIATELY  FOLLOWING  A  MEAL (Patient taking differently: Take 50 mg by mouth daily. ) 30 tablet 6   omeprazole (PRILOSEC) 20 MG capsule Take 1 capsule (20 mg total) by mouth daily. 90 capsule 1   tamsulosin (FLOMAX) 0.4 MG CAPS capsule Take 1 capsule (0.4 mg total) by mouth daily after supper for 30 days. 30 capsule 0   hydrochlorothiazide (HYDRODIURIL) 25 MG tablet TAKE 1 TABLET BY MOUTH ONCE DAILY (Patient taking differently: Take 25 mg by mouth daily. ) 90 tablet 0   Insulin Detemir (LEVEMIR FLEXPEN) 100 UNIT/ML Pen Inject 15 units once daily. (Patient taking differently: Inject 15 Units into the skin at bedtime. ) 5 pen 1   Multiple Vitamin (MULTIVITAMIN WITH MINERALS) TABS tablet Take 1 tablet by mouth daily.     naloxegol oxalate (MOVANTIK) 25 MG TABS tablet Take 1 tablet (25 mg total) by mouth daily. 30 tablet 11   No facility-administered medications prior to visit.     ROS Review of Systems  Constitutional: Positive for activity change, appetite change, fatigue and unexpected weight change. Negative for chills, diaphoresis and fever.  HENT: Negative.  Negative for trouble swallowing.   Respiratory: Negative.  Negative for cough, chest  tightness and shortness of breath.   Cardiovascular: Negative for chest pain, palpitations and leg swelling.  Gastrointestinal: Negative for abdominal pain, blood in stool, diarrhea, nausea and vomiting.  Endocrine: Negative.  Negative for polydipsia, polyphagia and polyuria.  Genitourinary: Negative.  Negative for difficulty urinating.  Musculoskeletal: Negative.  Negative for back pain and myalgias.  Skin: Negative.  Negative for color change and pallor.  Allergic/Immunologic: Negative.   Neurological: Positive for weakness and light-headedness. Negative for dizziness, numbness and headaches.  Hematological:  Negative for adenopathy. Does not bruise/bleed easily.  Psychiatric/Behavioral: Negative.     Objective:  BP 114/76    Pulse 64    Temp (!) 97.4 F (36.3 C) (Oral)    Resp 18    Ht 5\' 10"  (1.778 m)    Wt 148 lb (67.1 kg)    SpO2 96% Comment: unable to obtain   BMI 21.24 kg/m   BP Readings from Last 3 Encounters:  10/27/18 114/76  10/22/18 (!) 112/58  10/20/18 121/73    Wt Readings from Last 3 Encounters:  10/27/18 148 lb (67.1 kg)  10/22/18 150 lb 11.2 oz (68.4 kg)  10/20/18 150 lb 5.7 oz (68.2 kg)    Physical Exam Constitutional:      General: He is not in acute distress.    Appearance: He is ill-appearing. He is not toxic-appearing or diaphoretic.  HENT:     Nose: Nose normal. No congestion.     Mouth/Throat:     Mouth: Mucous membranes are dry.     Pharynx: No oropharyngeal exudate or posterior oropharyngeal erythema.  Eyes:     General: No scleral icterus.    Conjunctiva/sclera: Conjunctivae normal.  Neck:     Musculoskeletal: Normal range of motion. No neck rigidity.  Cardiovascular:     Rate and Rhythm: Normal rate and regular rhythm.     Heart sounds: No murmur. No gallop.   Pulmonary:     Effort: Pulmonary effort is normal. No respiratory distress.     Breath sounds: No stridor. No wheezing, rhonchi or rales.  Abdominal:     General: Abdomen is flat. Bowel sounds are normal. There is no distension.     Palpations: There is no hepatomegaly, splenomegaly or mass.     Tenderness: There is no abdominal tenderness. There is no guarding.  Musculoskeletal: Normal range of motion.        General: No swelling.     Right lower leg: No edema.     Left lower leg: No edema.  Lymphadenopathy:     Cervical: No cervical adenopathy.  Skin:    General: Skin is warm and dry.     Coloration: Skin is not pale.  Neurological:     General: No focal deficit present.     Mental Status: He is alert and oriented to person, place, and time.  Psychiatric:        Mood and Affect:  Mood normal.        Behavior: Behavior normal.     Lab Results  Component Value Date   WBC 8.2 10/27/2018   HGB 12.1 (L) 10/27/2018   HCT 36.0 (L) 10/27/2018   PLT 402.0 (H) 10/27/2018   GLUCOSE 122 (H) 10/27/2018   CHOL 178 02/23/2018   TRIG 108.0 02/23/2018   HDL 48.00 02/23/2018   LDLDIRECT 92.0 04/03/2017   LDLCALC 108 (H) 02/23/2018   ALT 17 10/19/2018   AST 27 10/19/2018   NA 138 10/27/2018   K 3.5 10/27/2018  CL 99 10/27/2018   CREATININE 1.78 (H) 10/27/2018   BUN 28 (H) 10/27/2018   CO2 28 10/27/2018   TSH 1.93 08/19/2018   PSA 1.32 09/16/2016   INR 1.16 09/11/2018   HGBA1C 6.6 (A) 10/27/2018   MICROALBUR 3.3 (H) 02/23/2018    Ct Abdomen Pelvis Wo Contrast  Result Date: 10/19/2018 CLINICAL DATA:  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 CBD stent placed January 2020. Abdominal pain. EXAM: CT ABDOMEN AND PELVIS WITHOUT CONTRAST TECHNIQUE: Multidetector CT imaging of the abdomen and pelvis was performed following the standard protocol without IV contrast. COMPARISON:  09/10/2018 CT abdomen. 07/28/2018 MRI abdomen. 07/27/2018 unenhanced CT abdomen/pelvis. FINDINGS: Lower chest: No significant pulmonary nodules or acute consolidative airspace disease. Coronary atherosclerosis. Hepatobiliary: Normal liver size. No liver mass. Pneumobilia throughout the mildly dilated left liver lobe intrahepatic bile ducts, unchanged. Pneumobilia in the nondistended gallbladder with no radiopaque cholelithiasis. No gallbladder wall thickening or pericholecystic fluid. Reflux of oral contrast into right liver lobe bile ducts. Well-positioned CBD stent with pneumobilia in CBD. Distal CBD stent tip in the descending duodenal lumen. Pancreas: Poorly marginated pancreatic head 5.6 x 3.0 cm mass (series 2/image 31), previously 5.7 x 3.2 cm on 09/10/2018 CT, not appreciably changed. Stable mild pancreatic duct dilation in the pancreatic body and tail. Progression of pancreatic  parenchymal atrophy in the pancreatic body and tail. Spleen: Atrophic spleen.  No splenic mass. Adrenals/Urinary Tract: Normal adrenals. No renal stones. No hydronephrosis. Small simple bilateral renal cysts, largest 1.8 cm in the medial upper right kidney. No new contour deforming renal lesions. Bladder collapsed by indwelling Foley catheter. Expected gas within the nondependent bladder lumen due to instrumentation. Stomach/Bowel: Normal non-distended stomach. Normal caliber small bowel with no small bowel wall thickening. Normal appendix. Oral contrast transits to the right colon. Stable postsurgical changes from subtotal distal colectomy with intact appearing distal colonic anastomosis. Moderate diffuse diverticulosis in the remnant large-bowel, with no acute large bowel wall thickening or acute pericolonic fat stranding. Vascular/Lymphatic: Atherosclerotic nonaneurysmal abdominal aorta. Similar soft tissue at least partially encasing the proximal SMA and celiac trunk. No pathologically enlarged lymph nodes in the abdomen or pelvis. Reproductive: Moderate prostatomegaly. Other: No pneumoperitoneum, ascites or focal fluid collection. Stable moderate fat containing supraumbilical midline ventral abdominal hernia. Musculoskeletal: No aggressive appearing focal osseous lesions. Moderate thoracolumbar spondylosis. IMPRESSION: 1. Locally advanced pancreatic head neoplasm appears stable. Progressive parenchymal atrophy in the pancreatic body and tail. 2. Stable well-positioned patent CBD stent. 3. No findings of metastatic disease on this noncontrast CT. 4. No acute abnormality. No evidence of bowel obstruction or acute bowel inflammation. Moderate diffuse colonic diverticulosis, with no noncontrast CT findings of acute diverticulitis. 5. Moderate prostatomegaly. Bladder decompressed by indwelling Foley catheter. 6. Stable moderate fat containing supraumbilical midline ventral abdominal hernia. 7.  Aortic  Atherosclerosis (ICD10-I70.0). Electronically Signed   By: Ilona Sorrel M.D.   On: 10/19/2018 14:59    Assessment & Plan:   Cedrik was seen today for hypertension and diabetes.  Diagnoses and all orders for this visit:  Essential hypertension, benign- His blood pressure is over controlled and he is prerenal with renal insufficiency.  Also, his potassium level was recently low so I have asked him to stop taking the thiazide diuretic. -     Basic metabolic panel; Future  Malignant neoplasm of head of pancreas [facility])- He will start treatment for this soon. -     dronabinol (MARINOL) 2.5 MG capsule; Take 1 capsule (2.5 mg total) by mouth  2 (two) times daily before lunch and supper.  Type 2 diabetes mellitus with diabetic nephropathy, with long-term current use of insulin ([place])- His A1c is down to 6.6% so I have asked him to decrease his basal insulin dose to 10 units a day.  He will continue using the short acting insulin as needed. -     POCT HgB A1C -     Insulin Detemir (LEVEMIR FLEXPEN) 100 UNIT/ML Pen; Inject 10 Units into the skin daily. Inject 15 units once daily.  CRI (chronic renal insufficiency), stage 3 (moderate) (HCC)- He will avoid nephrotoxic agents. -     Basic metabolic panel; Future  Deficiency anemia- He has developed a normochromic, normocytic anemia and an elevated platelet count.  I will screen him for vitamin deficiencies and will treat if indicated. -     Vitamin B12; Future -     CBC with Differential/Platelet; Future -     IBC panel; Future -     Folate; Future -     Ferritin; Future -     Vitamin B1; Future -     Reticulocytes; Future  Malignant cachexia ([place])- I have asked him to try dronabinol to increase his appetite and to help him gain muscle mass.  Will start at a low dose and will increase the dose as indicated. -     dronabinol (MARINOL) 2.5 MG capsule; Take 1 capsule (2.5 mg total) by mouth 2 (two) times daily before lunch and supper.   I have  discontinued Couper Redfield's hydrochlorothiazide, multivitamin with minerals, and naloxegol oxalate. I have also changed his Insulin Detemir. Additionally, I am having him start on dronabinol. Lastly, I am having him maintain his fish oil-omega-3 fatty acids, Insulin Syringe-Needle U-100, insulin regular, albuterol, glucose blood, apixaban, amLODipine, metoprolol succinate, acetaminophen, omeprazole, alum & mag hydroxide-simeth, and tamsulosin.  Meds ordered this encounter  Medications   Insulin Detemir (LEVEMIR FLEXPEN) 100 UNIT/ML Pen    Sig: Inject 10 Units into the skin daily. Inject 15 units once daily.    Dispense:  5 pen    Refill:  1   dronabinol (MARINOL) 2.5 MG capsule    Sig: Take 1 capsule (2.5 mg total) by mouth 2 (two) times daily before lunch and supper.    Dispense:  180 capsule    Refill:  0     Follow-up: Return in about 3 months (around 01/26/2019).  Scarlette Calico, MD

## 2018-10-28 ENCOUNTER — Other Ambulatory Visit: Payer: Self-pay | Admitting: *Deleted

## 2018-10-28 DIAGNOSIS — C250 Malignant neoplasm of head of pancreas: Secondary | ICD-10-CM

## 2018-10-29 ENCOUNTER — Other Ambulatory Visit: Payer: Medicare Other

## 2018-10-29 ENCOUNTER — Other Ambulatory Visit: Payer: Self-pay | Admitting: *Deleted

## 2018-10-29 ENCOUNTER — Telehealth: Payer: Self-pay

## 2018-10-29 ENCOUNTER — Ambulatory Visit: Payer: Medicare Other | Admitting: Oncology

## 2018-10-29 DIAGNOSIS — C250 Malignant neoplasm of head of pancreas: Secondary | ICD-10-CM

## 2018-10-29 DIAGNOSIS — Z794 Long term (current) use of insulin: Principal | ICD-10-CM

## 2018-10-29 DIAGNOSIS — E1121 Type 2 diabetes mellitus with diabetic nephropathy: Secondary | ICD-10-CM

## 2018-10-29 MED ORDER — INSULIN DETEMIR 100 UNIT/ML FLEXPEN: 10.0000 [IU] | PEN_INJECTOR | Freq: Every day | SUBCUTANEOUS | 1 refills | Status: DC

## 2018-10-29 NOTE — Telephone Encounter (Signed)
Key: [id number]  PA has been approved  Left detailed message for pt informing of same and to contact pharmacy to have medication filled.

## 2018-10-29 NOTE — Telephone Encounter (Signed)
Copied from Loop [id number]. Topic: Quick Communication - Rx Refill/Question >> Oct 28, 2018  2:44 PM Richardo Priest, NT wrote: Medication:  Insulin Detemir (LEVEMIR FLEXPEN) 100 UNIT/ML Pen   Has the patient contacted their pharmacy? Yes.Call received from pharmacy concerning the instructions for the patients pen. Unclear if they wanted to have 10 or 15 units at injection.   Preferred Pharmacy (with phone number or street name):  [facility] [place] [phone] (Phone) [phone] (Fax)  Agent: Please be advised that RX refills may take up to 3 business days. We ask that you follow-up with your pharmacy.

## 2018-10-29 NOTE — Telephone Encounter (Signed)
Resent to pharmacy with note to disregard last rx.

## 2018-10-29 NOTE — Telephone Encounter (Signed)
10 units QD

## 2018-10-29 NOTE — Telephone Encounter (Signed)
Pharmacy needs clarification:   Levemir - Inject how many units?   Rx from 07/31/2016 states 15units daily.  Rx from 10/27/2018 states 10 units and 15 units.   Please advise

## 2018-10-30 ENCOUNTER — Other Ambulatory Visit: Payer: Self-pay | Admitting: Internal Medicine

## 2018-10-30 ENCOUNTER — Other Ambulatory Visit: Payer: Self-pay

## 2018-10-30 ENCOUNTER — Telehealth: Payer: Self-pay | Admitting: Oncology

## 2018-10-30 ENCOUNTER — Inpatient Hospital Stay (HOSPITAL_BASED_OUTPATIENT_CLINIC_OR_DEPARTMENT_OTHER): Payer: Medicare Other | Admitting: Oncology

## 2018-10-30 ENCOUNTER — Inpatient Hospital Stay: Payer: Medicare Other | Attending: Oncology

## 2018-10-30 VITALS — BP 131/77 | HR 78 | Temp 98.6°F | Resp 19 | Ht 70.0 in | Wt 149.6 lb

## 2018-10-30 DIAGNOSIS — R630 Anorexia: Secondary | ICD-10-CM | POA: Diagnosis not present

## 2018-10-30 DIAGNOSIS — I429 Cardiomyopathy, unspecified: Secondary | ICD-10-CM

## 2018-10-30 DIAGNOSIS — J449 Chronic obstructive pulmonary disease, unspecified: Secondary | ICD-10-CM

## 2018-10-30 DIAGNOSIS — Z5111 Encounter for antineoplastic chemotherapy: Secondary | ICD-10-CM

## 2018-10-30 DIAGNOSIS — C250 Malignant neoplasm of head of pancreas: Secondary | ICD-10-CM | POA: Diagnosis not present

## 2018-10-30 DIAGNOSIS — I4891 Unspecified atrial fibrillation: Secondary | ICD-10-CM | POA: Insufficient documentation

## 2018-10-30 DIAGNOSIS — E119 Type 2 diabetes mellitus without complications: Secondary | ICD-10-CM

## 2018-10-30 DIAGNOSIS — R634 Abnormal weight loss: Secondary | ICD-10-CM

## 2018-10-30 DIAGNOSIS — K831 Obstruction of bile duct: Secondary | ICD-10-CM | POA: Insufficient documentation

## 2018-10-30 DIAGNOSIS — I129 Hypertensive chronic kidney disease with stage 1 through stage 4 chronic kidney disease, or unspecified chronic kidney disease: Secondary | ICD-10-CM | POA: Diagnosis not present

## 2018-10-30 DIAGNOSIS — E538 Deficiency of other specified B group vitamins: Secondary | ICD-10-CM | POA: Insufficient documentation

## 2018-10-30 DIAGNOSIS — Z7189 Other specified counseling: Secondary | ICD-10-CM

## 2018-10-30 DIAGNOSIS — N189 Chronic kidney disease, unspecified: Secondary | ICD-10-CM

## 2018-10-30 DIAGNOSIS — F04 Amnestic disorder due to known physiological condition: Principal | ICD-10-CM

## 2018-10-30 LAB — CMP (CANCER CENTER ONLY)
ALT: 13 U/L (ref 0–44)
AST: 19 U/L (ref 15–41)
Albumin: 3.9 g/dL (ref 3.5–5.0)
Alkaline Phosphatase: 88 U/L (ref 38–126)
Anion gap: 12 (ref 5–15)
BUN: 28 mg/dL — ABNORMAL HIGH (ref 8–23)
CO2: 26 mmol/L (ref 22–32)
Calcium: 10.1 mg/dL (ref 8.9–10.3)
Chloride: 103 mmol/L (ref 98–111)
Creatinine: 2.01 mg/dL — ABNORMAL HIGH (ref 0.61–1.24)
GFR, Est AFR Am: 38 mL/min — ABNORMAL LOW (ref >60)
GFR, Estimated: 33 mL/min — ABNORMAL LOW (ref >60)
Glucose, Bld: 73 mg/dL (ref 70–99)
Potassium: 3.5 mmol/L (ref 3.5–5.1)
Sodium: 141 mmol/L (ref 135–145)
Total Bilirubin: 1 mg/dL (ref 0.3–1.2)
Total Protein: 8.6 g/dL — ABNORMAL HIGH (ref 6.5–8.1)

## 2018-10-30 LAB — RETICULOCYTES
ABS Retic: 34200 cells/uL (ref 25000–9000)
Retic Ct Pct: 0.9 %

## 2018-10-30 LAB — CBC WITH DIFFERENTIAL (CANCER CENTER ONLY)
Abs Immature Granulocytes: 0.02 10*3/uL (ref 0.00–0.07)
Basophils Absolute: 0.1 10*3/uL (ref 0.0–0.1)
Basophils Relative: 1 %
Eosinophils Absolute: 0.2 10*3/uL (ref 0.0–0.5)
Eosinophils Relative: 3 %
HCT: 38.7 % — ABNORMAL LOW (ref 39.0–52.0)
Hemoglobin: 12.3 g/dL — ABNORMAL LOW (ref 13.0–17.0)
Immature Granulocytes: 0 %
Lymphocytes Relative: 23 %
Lymphs Abs: 1.9 10*3/uL (ref 0.7–4.0)
MCH: 31.4 pg (ref 26.0–34.0)
MCHC: 31.8 g/dL (ref 30.0–36.0)
MCV: 98.7 fL (ref 80.0–100.0)
Monocytes Absolute: 0.5 10*3/uL (ref 0.1–1.0)
Monocytes Relative: 6 %
Neutro Abs: 5.4 10*3/uL (ref 1.7–7.7)
Neutrophils Relative %: 67 %
Platelet Count: 384 10*3/uL (ref 150–400)
RBC: 3.92 MIL/uL — ABNORMAL LOW (ref 4.22–5.81)
RDW: 12.8 % (ref 11.5–15.5)
WBC Count: 8.1 10*3/uL (ref 4.0–10.5)
nRBC: 0 % (ref 0.0–0.2)

## 2018-10-30 LAB — VITAMIN B1: Vitamin B1 (Thiamine): <6 nmol/L — ABNORMAL LOW (ref 8–30)

## 2018-10-30 MED ORDER — PROCHLORPERAZINE MALEATE 10 MG PO TABS: 10.0000 mg | ORAL_TABLET | Freq: Four times a day (QID) | ORAL | 1 refills | Status: AC | PRN

## 2018-10-30 MED ORDER — LIDOCAINE-PRILOCAINE 2.5-2.5 % EX CREA: 1.0000 "application " | TOPICAL_CREAM | CUTANEOUS | 3 refills | Status: DC | PRN

## 2018-10-30 MED ORDER — VITAMIN B-1 100 MG PO TABS: 100.0000 mg | ORAL_TABLET | Freq: Every day | ORAL | 1 refills | Status: AC

## 2018-10-30 NOTE — Progress Notes (Signed)
  [place] OFFICE PROGRESS NOTE   Diagnosis: Pancreas cancer  INTERVAL HISTORY:   [name] returns as scheduled.  He reports feeling much better.  Good appetite.  The Foley catheter was removed earlier this week.  He is urinating without difficulty.  He has been gardening. He saw Dr. Ronnald Ramp 10/27/2018.  The HCTZ was discontinued.  He was started on Marinol.  The insulin regimen was adjusted. Objective:  Vital signs in last 24 hours:  Blood pressure 131/77, pulse 78, temperature 98.6 F (37 C), temperature source Oral, resp. rate 19, height 5\' 10"  (1.778 m), weight 149 lb 9.6 oz (67.9 kg), SpO2 100 %.    Physical examination-not performed today  Lab Results:  Lab Results  Component Value Date   WBC 8.1 10/30/2018   HGB 12.3 (L) 10/30/2018   HCT 38.7 (L) 10/30/2018   MCV 98.7 10/30/2018   PLT 384 10/30/2018   NEUTROABS 5.4 10/30/2018    CMP  Lab Results  Component Value Date   NA 141 10/30/2018   K 3.5 10/30/2018   CL 103 10/30/2018   CO2 26 10/30/2018   GLUCOSE 73 10/30/2018   BUN 28 (H) 10/30/2018   CREATININE 2.01 (H) 10/30/2018   CALCIUM 10.1 10/30/2018   PROT 8.6 (H) 10/30/2018   ALBUMIN 3.9 10/30/2018   AST 19 10/30/2018   ALT 13 10/30/2018   ALKPHOS 88 10/30/2018   BILITOT 1.0 10/30/2018   GFRNONAA 33 (L) 10/30/2018   GFRAA 38 (L) 10/30/2018     Medications: I have reviewed the patient's current medications.   Assessment/Plan: 1. Pancreas cancer clinical stage III (cT4,cN0), adenocarcinoma on FNA biopsy of pancreas mass 10/08/2018  Presenting with obstructive jaundice December 2019  MRI abdomen 07/28/2018- bile duct obstruction secondary to pancreas head mass  ERCP/bile duct stent 07/30/2018  Repeat ERCP with placement of covered stent 08/13/2018  CT on 09/10/2018-pancreas mass associated with superior mesenteric artery-partially encased, increased soft tissue adjacent to the celiac axis  EUS biopsy 10/08/2018- adenocarcinoma 2.   Diabetes 3.  Renal insufficiency 4.  COPD 5.  History of diverticulitis 6.  Cardiomyopathy 7.  History of atrial fibrillation 8.  History of hypertension 9.  Anorexia/weight loss secondary to #1 10. Obstructive jaundice secondary to #1, status post placement of a bile duct stent   Disposition: [name] has been diagnosed with locally advanced, unresectable pancreas cancer.  We discussed treatment options.  He understands no therapy will be curative.  He does not appear to be a candidate for FOLFIRINOX.  I recommend gemcitabine/Abraxane.  We reviewed potential toxicities associated with the gemcitabine/Abraxane regimen including the chance for nausea, alopecia, and hematologic toxicity.  We discussed the fever, rash, and pneumonitis associated with gemcitabine.  We discussed the allergic reaction and neuropathy seen with Abraxane.  He agrees to proceed.  He will attend a chemotherapy teaching class. I will refer him for Port-A-Cath placement.  He will hold anticoagulation therapy for 2 days prior to placement of the Port-A-Cath.  The plan is to begin gemcitabine/Abraxane on a 2-week schedule starting on 11/06/2018.  He will be scheduled for an office visit and chemotherapy on 11/20/2018.  We will request a hereditary pancreas cancer panel.  40 minutes were spent with the patient today.  The majority of the time was used for counseling and coordination of care.  Betsy Coder, MD  10/30/2018  12:17 PM

## 2018-10-30 NOTE — Patient Instructions (Signed)
Will treat your cancer with Gemzar/Abraxane chemotherapy administered every 2 weeks. Radiology will be calling to schedule your port placement. EMLA cream (numbing cream) and Compazine (anti-nausea) have been sent to your pharmacy

## 2018-10-30 NOTE — Progress Notes (Signed)
START ON PATHWAY REGIMEN - Pancreatic Adenocarcinoma     A cycle is every 28 days:     Nab-paclitaxel (protein bound)      Gemcitabine   **Always confirm dose/schedule in your pharmacy ordering system**  Patient Characteristics: No Distant Metastases, Locally Advanced, Anatomically Unresectable, First Line, PS = 0,1, BRCA1/2 and PALB2 Mutation Absent/Unknown Current evidence of distant metastases<= No AJCC T Category: Staged < 8th Ed. AJCC N Category: Staged < 8th Ed. AJCC M Category: Staged < 8th Ed. AJCC 8 Stage Grouping: Staged < 8th Ed. Line of Therapy: First Line BRCA1/2 Mutation Status: Awaiting Test Results PALB2 Mutation Status: Awaiting Test Results Intent of Therapy: Non-Curative / Palliative Intent, Discussed with Patient

## 2018-10-30 NOTE — Telephone Encounter (Signed)
Scheduled appt per 4/3 los. ° °Patient aware of appt date and time. °

## 2018-10-31 LAB — CANCER ANTIGEN 19-9: CA 19-9: 700 U/mL — ABNORMAL HIGH (ref 0–35)

## 2018-11-01 ENCOUNTER — Other Ambulatory Visit: Payer: Self-pay | Admitting: Oncology

## 2018-11-02 ENCOUNTER — Other Ambulatory Visit: Payer: Self-pay | Admitting: Radiology

## 2018-11-02 ENCOUNTER — Telehealth: Payer: Self-pay | Admitting: *Deleted

## 2018-11-02 NOTE — Telephone Encounter (Signed)
Patient called to request review of his appointments this week thinking they would be emailed to him. He was aware of the [facility] placement already. Reviewed appointments for this week and he wants to have labs drawn peripheral instead of leaving port needle in overnight for chemo on 11/05/18. Informed him that education nurse will provide him a printout of his appointments. He needs to get his MYchart account activated to be notified of his appointments (don't send to gmail account. He plans to activate his Mychart today.

## 2018-11-03 ENCOUNTER — Telehealth: Payer: Self-pay | Admitting: *Deleted

## 2018-11-03 ENCOUNTER — Other Ambulatory Visit: Payer: Self-pay | Admitting: Radiology

## 2018-11-03 NOTE — Telephone Encounter (Signed)
Called pt this pm to see if he wanted to discuss chemo over the phone & he says that he has had a long day.  Informed that hopefully Education RN could call him on Thurs AM to discuss & if so might be able to move labs for thurs to Friday before chemo.  He expressed appreciation.

## 2018-11-04 ENCOUNTER — Other Ambulatory Visit: Payer: Self-pay | Admitting: Oncology

## 2018-11-04 ENCOUNTER — Encounter (HOSPITAL_COMMUNITY): Payer: Self-pay

## 2018-11-04 ENCOUNTER — Telehealth: Payer: Self-pay | Admitting: Internal Medicine

## 2018-11-04 ENCOUNTER — Telehealth: Payer: Self-pay

## 2018-11-04 ENCOUNTER — Ambulatory Visit (HOSPITAL_COMMUNITY)
Admission: RE | Admit: 2018-11-04 | Discharge: 2018-11-04 | Disposition: A | Discharge status: Home / Self Care | Payer: Medicare Other | Source: Ambulatory Visit | Attending: Oncology | Admitting: Oncology

## 2018-11-04 ENCOUNTER — Other Ambulatory Visit: Payer: Self-pay

## 2018-11-04 DIAGNOSIS — Z87891 Personal history of nicotine dependence: Secondary | ICD-10-CM | POA: Diagnosis not present

## 2018-11-04 DIAGNOSIS — I4891 Unspecified atrial fibrillation: Secondary | ICD-10-CM | POA: Diagnosis not present

## 2018-11-04 DIAGNOSIS — Z7901 Long term (current) use of anticoagulants: Secondary | ICD-10-CM | POA: Diagnosis not present

## 2018-11-04 DIAGNOSIS — C250 Malignant neoplasm of head of pancreas: Secondary | ICD-10-CM | POA: Diagnosis not present

## 2018-11-04 DIAGNOSIS — Z452 Encounter for adjustment and management of vascular access device: Secondary | ICD-10-CM | POA: Diagnosis not present

## 2018-11-04 DIAGNOSIS — I429 Cardiomyopathy, unspecified: Secondary | ICD-10-CM | POA: Insufficient documentation

## 2018-11-04 DIAGNOSIS — E119 Type 2 diabetes mellitus without complications: Secondary | ICD-10-CM | POA: Insufficient documentation

## 2018-11-04 DIAGNOSIS — I10 Essential (primary) hypertension: Secondary | ICD-10-CM | POA: Diagnosis not present

## 2018-11-04 DIAGNOSIS — C259 Malignant neoplasm of pancreas, unspecified: Secondary | ICD-10-CM | POA: Diagnosis not present

## 2018-11-04 DIAGNOSIS — K219 Gastro-esophageal reflux disease without esophagitis: Secondary | ICD-10-CM | POA: Insufficient documentation

## 2018-11-04 DIAGNOSIS — Z794 Long term (current) use of insulin: Secondary | ICD-10-CM | POA: Insufficient documentation

## 2018-11-04 DIAGNOSIS — Z79899 Other long term (current) drug therapy: Secondary | ICD-10-CM | POA: Diagnosis not present

## 2018-11-04 DIAGNOSIS — J449 Chronic obstructive pulmonary disease, unspecified: Secondary | ICD-10-CM | POA: Insufficient documentation

## 2018-11-04 HISTORY — PX: IR IMAGING GUIDED PORT INSERTION: IMG5740

## 2018-11-04 LAB — CBC WITH DIFFERENTIAL/PLATELET
Abs Immature Granulocytes: 0.03 10*3/uL (ref 0.00–0.07)
Basophils Absolute: 0.1 10*3/uL (ref 0.0–0.1)
Basophils Relative: 1 %
Eosinophils Absolute: 0.2 10*3/uL (ref 0.0–0.5)
Eosinophils Relative: 2 %
HCT: 38.8 % — ABNORMAL LOW (ref 39.0–52.0)
Hemoglobin: 11.9 g/dL — ABNORMAL LOW (ref 13.0–17.0)
Immature Granulocytes: 0 %
Lymphocytes Relative: 28 %
Lymphs Abs: 2.3 10*3/uL (ref 0.7–4.0)
MCH: 30.9 pg (ref 26.0–34.0)
MCHC: 30.7 g/dL (ref 30.0–36.0)
MCV: 100.8 fL — ABNORMAL HIGH (ref 80.0–100.0)
Monocytes Absolute: 0.5 10*3/uL (ref 0.1–1.0)
Monocytes Relative: 6 %
Neutro Abs: 5.3 10*3/uL (ref 1.7–7.7)
Neutrophils Relative %: 63 %
Platelets: 389 10*3/uL (ref 150–400)
RBC: 3.85 MIL/uL — ABNORMAL LOW (ref 4.22–5.81)
RDW: 13.2 % (ref 11.5–15.5)
WBC: 8.3 10*3/uL (ref 4.0–10.5)
nRBC: 0 % (ref 0.0–0.2)

## 2018-11-04 LAB — BASIC METABOLIC PANEL
Anion gap: 9 (ref 5–15)
BUN: 21 mg/dL (ref 8–23)
CO2: 24 mmol/L (ref 22–32)
Calcium: 9.4 mg/dL (ref 8.9–10.3)
Chloride: 105 mmol/L (ref 98–111)
Creatinine, Ser: 1.42 mg/dL — ABNORMAL HIGH (ref 0.61–1.24)
GFR calc Af Amer: 58 mL/min — ABNORMAL LOW (ref >60)
GFR calc non Af Amer: 50 mL/min — ABNORMAL LOW (ref >60)
Glucose, Bld: 127 mg/dL — ABNORMAL HIGH (ref 70–99)
Potassium: 3.5 mmol/L (ref 3.5–5.1)
Sodium: 138 mmol/L (ref 135–145)

## 2018-11-04 LAB — GLUCOSE, CAPILLARY: Glucose-Capillary: 117 mg/dL — ABNORMAL HIGH (ref 70–99)

## 2018-11-04 LAB — PROTIME-INR
INR: 1 (ref 0.8–1.2)
Prothrombin Time: 13.5 seconds (ref 11.4–15.2)

## 2018-11-04 MED ORDER — MIDAZOLAM HCL 2 MG/2ML IJ SOLN
INTRAMUSCULAR | Status: AC | PRN
  Administered 2018-11-04 (×4): 1 mg via INTRAVENOUS

## 2018-11-04 MED ORDER — MIDAZOLAM HCL 2 MG/2ML IJ SOLN
INTRAMUSCULAR | Status: AC
  Filled 2018-11-04: qty 2

## 2018-11-04 MED ORDER — SODIUM CHLORIDE 0.9 % IV SOLN
INTRAVENOUS | Status: DC
  Administered 2018-11-04: 08:00:00 via INTRAVENOUS

## 2018-11-04 MED ORDER — LIDOCAINE HCL 1 % IJ SOLN
INTRAMUSCULAR | Status: AC
  Filled 2018-11-04: qty 20

## 2018-11-04 MED ORDER — HEPARIN SOD (PORK) LOCK FLUSH 100 UNIT/ML IV SOLN
INTRAVENOUS | Status: AC | PRN
  Administered 2018-11-04: 500 [IU] via INTRAVENOUS

## 2018-11-04 MED ORDER — CLINDAMYCIN PHOSPHATE 900 MG/50ML IV SOLN
INTRAVENOUS | Status: AC
  Administered 2018-11-04: 900 mg via INTRAVENOUS
  Filled 2018-11-04: qty 50

## 2018-11-04 MED ORDER — FENTANYL CITRATE (PF) 100 MCG/2ML IJ SOLN
INTRAMUSCULAR | Status: AC
  Filled 2018-11-04: qty 2

## 2018-11-04 MED ORDER — LIDOCAINE HCL (PF) 1 % IJ SOLN
INTRAMUSCULAR | Status: AC | PRN
  Administered 2018-11-04 (×2): 10 mL

## 2018-11-04 MED ORDER — FENTANYL CITRATE (PF) 100 MCG/2ML IJ SOLN
INTRAMUSCULAR | Status: AC | PRN
  Administered 2018-11-04 (×2): 50 ug via INTRAVENOUS

## 2018-11-04 MED ORDER — CLINDAMYCIN PHOSPHATE 900 MG/50ML IV SOLN
900.0000 mg | Freq: Once | INTRAVENOUS | Status: AC
  Administered 2018-11-04: 10:00:00 900 mg via INTRAVENOUS

## 2018-11-04 MED ORDER — HEPARIN SOD (PORK) LOCK FLUSH 100 UNIT/ML IV SOLN
INTRAVENOUS | Status: AC
  Filled 2018-11-04: qty 5

## 2018-11-04 NOTE — Consult Note (Signed)
Chief Complaint: Patient was seen in consultation today for Port-A-Cath placement  Referring Physician(s): Sherrill,Gary B  Supervising Physician: Aletta Edouard  Patient Status: [facility] - Out-pt  History of Present Illness: [name] [name] is a 68 y.o. male with history of newly diagnosed pancreatic carcinoma who presents today for Port-A-Cath placement for palliative chemotherapy.  Additional medical history as below.  Past Medical History:  Diagnosis Date  . Allergy   . Asthma   . Atrial fibrillation ([place])    a. initially occuring in 2014, has declined anticoagulation since having a GI bleed with initiation of Xarelto  . Cancer ([place])   . Cardiomyopathy 2011   a. Patient was told that he had an ejection fraction of 37% by echo done elsewhere in 2011 b. 06/2014: echo showing preserved EF of 55-60%, severe asymetric septal hypertrophy with no SAM or LVOT gradient  . COPD (chronic obstructive pulmonary disease) ([place])   . Diabetes mellitus without complication ([place])   . Diverticulitis   . GERD (gastroesophageal reflux disease)   . History of colon polyps   . Hypertension   . Tobacco abuse     Past Surgical History:  Procedure Laterality Date  . BILIARY STENT PLACEMENT  07/30/2018   Procedure: BILIARY STENT PLACEMENT;  Surgeon: Ladene Artist, MD;  Location: [facility] ENDOSCOPY;  Service: Endoscopy;;  . BILIARY STENT PLACEMENT N/A 08/13/2018   Procedure: BILIARY STENT PLACEMENT;  Surgeon: Milus Banister, MD;  Location: [facility] ENDOSCOPY;  Service: Endoscopy;  Laterality: N/A;  . COLON SURGERY  2009  . COLONOSCOPY    . ERCP    . ERCP N/A 07/30/2018   Procedure: ENDOSCOPIC RETROGRADE CHOLANGIOPANCREATOGRAPHY (ERCP);  Surgeon: Ladene Artist, MD;  Location: [facility] ENDOSCOPY;  Service: Endoscopy;  Laterality: N/A;  . ERCP N/A 08/13/2018   Procedure: ENDOSCOPIC RETROGRADE CHOLANGIOPANCREATOGRAPHY (ERCP);  Surgeon: Milus Banister, MD;  Location: [facility] ENDOSCOPY;  Service: Endoscopy;  Laterality:  N/A;  . ESOPHAGOGASTRODUODENOSCOPY N/A 08/13/2018   Procedure: ESOPHAGOGASTRODUODENOSCOPY (EGD);  Surgeon: Milus Banister, MD;  Location: [facility] ENDOSCOPY;  Service: Endoscopy;  Laterality: N/A;  . ESOPHAGOGASTRODUODENOSCOPY (EGD) WITH PROPOFOL N/A 10/08/2018   Procedure: ESOPHAGOGASTRODUODENOSCOPY (EGD) WITH PROPOFOL;  Surgeon: Milus Banister, MD;  Location: [facility] ENDOSCOPY;  Service: Endoscopy;  Laterality: N/A;  . EUS N/A 08/13/2018   Procedure: UPPER ENDOSCOPIC ULTRASOUND (EUS) RADIAL;  Surgeon: Milus Banister, MD;  Location: [facility] ENDOSCOPY;  Service: Endoscopy;  Laterality: N/A;  . EUS N/A 10/08/2018   Procedure: UPPER ENDOSCOPIC ULTRASOUND (EUS) RADIAL;  Surgeon: Milus Banister, MD;  Location: [facility] ENDOSCOPY;  Service: Endoscopy;  Laterality: N/A;  . FINE NEEDLE ASPIRATION N/A 08/13/2018   Procedure: FINE NEEDLE ASPIRATION (FNA) LINEAR;  Surgeon: Milus Banister, MD;  Location: [facility] ENDOSCOPY;  Service: Endoscopy;  Laterality: N/A;  . FINE NEEDLE ASPIRATION  10/08/2018   Procedure: FINE NEEDLE ASPIRATION;  Surgeon: Milus Banister, MD;  Location: [facility] ENDOSCOPY;  Service: Endoscopy;;  . Lavell Islam REMOVAL  08/13/2018   Procedure: STENT REMOVAL;  Surgeon: Milus Banister, MD;  Location: [facility] ENDOSCOPY;  Service: Endoscopy;;    Allergies: Hydralazine; Lisinopril; Penicillins; and Tribenzor [olmesartan-amlodipine-hctz]  Medications: Prior to Admission medications   Medication Sig Start Date End Date Taking? Authorizing Provider  acetaminophen (TYLENOL) 500 MG tablet Take 1,000 mg by mouth every 6 (six) hours as needed (pain).   Yes [provider]  amLODipine (NORVASC) 5 MG tablet TAKE 1 TABLET BY MOUTH ONCE DAILY Patient taking differently: Take 5 mg by mouth daily.  07/15/18  Yes Strader, [place] M, PA-C  fish oil-omega-3 fatty acids 1000 MG capsule Take 2 g by mouth daily.    Yes [provider]  insulin regular (NOVOLIN R RELION) 100 units/mL injection INJECT 5-8 UNITS  SUBCUTANEOUSLY THREE TIMES DAILY before meals Patient taking differently: Inject 5-8 Units into the skin 3 (three) times daily as needed for high blood sugar (CBG >120).  04/16/17  Yes Philemon Kingdom, MD  metoprolol succinate (TOPROL-XL) 50 MG 24 hr tablet TAKE 1 TABLET BY MOUTH ONCE DAILY WITH  OR  IMMEDIATELY  FOLLOWING  A  MEAL Patient taking differently: Take 50 mg by mouth daily.  07/20/18  Yes Lelon Perla, MD  omeprazole (PRILOSEC) 20 MG capsule Take 1 capsule (20 mg total) by mouth daily. 09/28/18  Yes Janith Lima, MD  tamsulosin (FLOMAX) 0.4 MG CAPS capsule Take 1 capsule (0.4 mg total) by mouth daily after supper for 30 days. 10/20/18 11/19/18 Yes Purohit, Konrad Dolores, MD  albuterol (PROVENTIL HFA;VENTOLIN HFA) 108 (90 Base) MCG/ACT inhaler Inhale 2 puffs into the lungs every 6 (six) hours as needed for wheezing. Patient not taking: Reported on 10/30/2018 09/23/17   Janith Lima, MD  alum & mag hydroxide-simeth (MAALOX/MYLANTA) 200-200-20 MG/5ML suspension Take 30 mLs by mouth every 6 (six) hours as needed for indigestion or heartburn.    [provider]  apixaban (ELIQUIS) 5 MG TABS tablet Take 1 tablet (5 mg total) by mouth 2 (two) times daily. 06/19/18   Lelon Perla, MD  dronabinol (MARINOL) 2.5 MG capsule Take 1 capsule (2.5 mg total) by mouth 2 (two) times daily before lunch and supper. Patient not taking: Reported on 10/30/2018 10/27/18   Janith Lima, MD  glucose blood (ONETOUCH VERIO) test strip USE AS INSTRUCTED THREE TIMES DAILY. DX CODE E11.21 01/12/18   Philemon Kingdom, MD  Insulin Detemir (LEVEMIR FLEXPEN) 100 UNIT/ML Pen Inject 10 Units into the skin daily. 10/29/18   Janith Lima, MD  Insulin Syringe-Needle U-100 (EASY TOUCH INSULIN SYRINGE) 31G X 5/16" 0.3 ML MISC USE AS DIRECTED 4 TIMES DAILY 05/01/16   Philemon Kingdom, MD  lidocaine-prilocaine (EMLA) cream Apply 1 application topically as needed. 10/30/18   Ladell Pier, MD  prochlorperazine  (COMPAZINE) 10 MG tablet Take 1 tablet (10 mg total) by mouth every 6 (six) hours as needed for nausea or vomiting. 10/30/18   Ladell Pier, MD  thiamine (VITAMIN B-1) 100 MG tablet Take 1 tablet (100 mg total) by mouth daily. 10/30/18   Janith Lima, MD     Family History  Problem Relation Age of Onset  . Prostate cancer Father   . Colon cancer Father   . Diabetes Father   . Arthritis Father   . Diabetes type II Mother   . Atrial fibrillation Mother   . Hypertension Mother   . Arthritis Mother   . Cancer Other        Colon,Breast, Prostate Cancer  . Hypertension Other   . Prostate cancer Brother        5 brothers - 2 were dx with prostate cancer  . Esophageal cancer Neg Hx   . Stomach cancer Neg Hx   . Rectal cancer Neg Hx     Social History   Socioeconomic History  . Marital status: Single    Spouse name: Not on file  . Number of children: 2  . Years of education: Not on file  . Highest education level: Not on  file  Occupational History  . Not on file  Social Needs  . Financial resource strain: Not hard at all  . Food insecurity:    Worry: Never true    Inability: Never true  . Transportation needs:    Medical: No    Non-medical: No  Tobacco Use  . Smoking status: Former Smoker    Types: Cigarettes    Last attempt to quit: 07/30/2011    Years since quitting: 7.2  . Smokeless tobacco: Never Used  . Tobacco comment: Smoked since age 53  Substance and Sexual Activity  . Alcohol use: Yes    Alcohol/week: 1.0 standard drinks    Types: 1 Standard drinks or equivalent per week  . Drug use: No  . Sexual activity: Not Currently  Lifestyle  . Physical activity:    Days per week: 3 days    Minutes per session: 50 min  . Stress: Not at all  Relationships  . Social connections:    Talks on phone: More than three times a week    Gets together: More than three times a week    Attends religious service: More than 4 times per year    Active member of club or  organization: Yes    Attends meetings of clubs or organizations: More than 4 times per year    Relationship status: Not on file  Other Topics Concern  . 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  Vital Signs: Blood pressure 126/79, heart rate 65, temperature 98, respirations 18, O2 sat 100% room air   Physical Exam awake, alert.  Chest with distant breath sounds bilaterally.  Heart with regular rate and rhythm.  Abdomen soft, positive bowel sounds, nontender.  No lower extremity edema.  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 CBD stent placed January 2020. Abdominal pain. EXAM: CT ABDOMEN AND PELVIS WITHOUT CONTRAST TECHNIQUE: Multidetector CT imaging of the abdomen and pelvis was performed following the standard protocol without IV contrast. COMPARISON:  09/10/2018 CT abdomen. 07/28/2018 MRI abdomen. 07/27/2018 unenhanced CT abdomen/pelvis. FINDINGS: Lower chest: No significant pulmonary nodules or acute consolidative airspace disease. Coronary atherosclerosis. Hepatobiliary: Normal liver size. No liver mass. Pneumobilia throughout the mildly dilated left liver lobe intrahepatic bile ducts, unchanged. Pneumobilia in the nondistended gallbladder with no radiopaque cholelithiasis. No gallbladder wall thickening or pericholecystic fluid. Reflux of oral contrast into right liver lobe bile ducts. Well-positioned CBD stent with pneumobilia in CBD. Distal CBD stent tip in the descending duodenal lumen. Pancreas: Poorly marginated pancreatic head 5.6 x 3.0 cm mass  (series 2/image 31), previously 5.7 x 3.2 cm on 09/10/2018 CT, not appreciably changed. Stable mild pancreatic duct dilation in the pancreatic body and tail. Progression of pancreatic parenchymal atrophy in the pancreatic body and tail. Spleen: Atrophic spleen.  No splenic mass. Adrenals/Urinary Tract: Normal adrenals. No renal stones. No hydronephrosis. Small simple bilateral renal cysts, largest 1.8 cm in the medial upper right kidney. No new contour deforming renal lesions. Bladder collapsed by indwelling Foley catheter. Expected gas within the nondependent bladder lumen due  to instrumentation. Stomach/Bowel: Normal non-distended stomach. Normal caliber small bowel with no small bowel wall thickening. Normal appendix. Oral contrast transits to the right colon. Stable postsurgical changes from subtotal distal colectomy with intact appearing distal colonic anastomosis. Moderate diffuse diverticulosis in the remnant large-bowel, with no acute large bowel wall thickening or acute pericolonic fat stranding. Vascular/Lymphatic: Atherosclerotic nonaneurysmal abdominal aorta. Similar soft tissue at least partially encasing the proximal SMA and celiac trunk. No pathologically enlarged lymph nodes in the abdomen or pelvis. Reproductive: Moderate prostatomegaly. Other: No pneumoperitoneum, ascites or focal fluid collection. Stable moderate fat containing supraumbilical midline ventral abdominal hernia. Musculoskeletal: No aggressive appearing focal osseous lesions. Moderate thoracolumbar spondylosis. IMPRESSION: 1. Locally advanced pancreatic head neoplasm appears stable. Progressive parenchymal atrophy in the pancreatic body and tail. 2. Stable well-positioned patent CBD stent. 3. No findings of metastatic disease on this noncontrast CT. 4. No acute abnormality. No evidence of bowel obstruction or acute bowel inflammation. Moderate diffuse colonic diverticulosis, with no noncontrast CT findings of acute diverticulitis. 5.  Moderate prostatomegaly. Bladder decompressed by indwelling Foley catheter. 6. Stable moderate fat containing supraumbilical midline ventral abdominal hernia. 7.  Aortic Atherosclerosis (ICD10-I70.0). Electronically Signed   By: Ilona Sorrel M.D.   On: 10/19/2018 14:59    Labs:  CBC: Recent Labs    10/19/18 1120 10/27/18 1324 10/30/18 1132 11/04/18 0825  WBC 6.5 8.2 8.1 8.3  HGB 12.6* 12.1* 12.3* 11.9*  HCT 39.3 36.0* 38.7* 38.8*  PLT 458* 402.0* 384 389    COAGS: Recent Labs    07/28/18 0459 07/29/18 0647 09/11/18 1618 11/04/18 0825  INR 1.16 1.02 1.16 1.0    BMP: Recent Labs    10/19/18 1120 10/20/18 0435 10/27/18 1324 10/30/18 1132 11/04/18 0825  NA 136 136 138 141 138  K 3.6 3.1* 3.5 3.5 3.5  CL 94* 98 99 103 105  CO2 25 27 28 26 24   GLUCOSE 141* 111* 122* 73 127*  BUN 39* 35* 28* 28* 21  CALCIUM 9.7 9.0 9.6 10.1 9.4  CREATININE 2.55* 2.12* 1.78* 2.01* 1.42*  GFRNONAA 25* 31*  --  33* 50*  GFRAA 29* 36*  --  38* 58*    LIVER FUNCTION TESTS: Recent Labs    09/01/18 0912 09/11/18 1618 10/19/18 1120 10/30/18 1132  BILITOT 2.7* 1.9* 2.3* 1.0  AST 17 26 27 19   ALT 17 15 17 13   ALKPHOS 148* 94 84 88  PROT 7.2 7.1 8.4* 8.6*  ALBUMIN 3.8 3.1* 4.3 3.9    TUMOR MARKERS: No results for input(s): AFPTM, CEA, CA199, CHROMGRNA in the last 8760 hours.  Assessment and Plan: 68 y.o. male with history of newly diagnosed pancreatic carcinoma who presents today for Port-A-Cath placement for palliative chemotherapy.Risks and benefits of image guided port-a-catheter placement was discussed with the patient including, but not limited to bleeding, infection, pneumothorax, or fibrin sheath development and need for additional procedures.  All of the patient's questions were answered, patient is agreeable to proceed. Consent signed and in chart.     Thank you for this interesting consult.  I greatly enjoyed meeting Devrin Monforte and look forward to participating  in their care.  A copy of this report was sent to the requesting provider on this date.  Electronically Signed: D. Rowe Robert, PA-C 11/04/2018, 9:22 AM   I spent a total of 25 minutes  in face to face in clinical consultation, greater than 50% of which was counseling/coordinating care for port a  cath placement

## 2018-11-04 NOTE — Discharge Instructions (Signed)
Moderate Conscious Sedation, Adult, Care After These instructions provide you with information about caring for yourself after your procedure. Your health care provider may also give you more specific instructions. Your treatment has been planned according to current medical practices, but problems sometimes occur. Call your health care provider if you have any problems or questions after your procedure. What can I expect after the procedure? After your procedure, it is common:  To feel sleepy for several hours.  To feel clumsy and have poor balance for several hours.  To have poor judgment for several hours.  To vomit if you eat too soon. Follow these instructions at home: For at least 24 hours after the procedure:   Do not: ? Participate in activities where you could fall or become injured. ? Drive. ? Use heavy machinery. ? Drink alcohol. ? Take sleeping pills or medicines that cause drowsiness. ? Make important decisions or sign legal documents. ? Take care of children on your own.  Rest. Eating and drinking  Follow the diet recommended by your health care provider.  If you vomit: ? Drink water, juice, or soup when you can drink without vomiting. ? Make sure you have little or no nausea before eating solid foods. General instructions  Have a responsible adult stay with you until you are awake and alert.  Take over-the-counter and prescription medicines only as told by your health care provider.  If you smoke, do not smoke without supervision.  Keep all follow-up visits as told by your health care provider. This is important. Contact a health care provider if:  You keep feeling nauseous or you keep vomiting.  You feel light-headed.  You develop a rash.  You have a fever. Get help right away if:  You have trouble breathing. This information is not intended to replace advice given to you by your health care provider. Make sure you discuss any questions you have  with your health care provider. Document Released: 05/05/2013 Document Revised: 12/18/2015 Document Reviewed: 11/04/2015 Elsevier Interactive Patient Education  2019 [place] Insertion, Care After This sheet gives you information about how to care for yourself after your procedure. Your health care provider may also give you more specific instructions. If you have problems or questions, contact your health care provider. What can I expect after the procedure? After the procedure, it is common to have:  Discomfort at the port insertion site.  Bruising on the skin over the port. This should improve over 3-4 days. Follow these instructions at home: [facility] care  After your port is placed, you will get a manufacturer's information card. The card has information about your port. Keep this card with you at all times.  Take care of the port as told by your health care provider. Ask your health care provider if you or a family member can get training for taking care of the port at home. A home health care nurse may also take care of the port.  Make sure to remember what type of port you have. Incision care      Follow instructions from your health care provider about how to take care of your port insertion site. Make sure you: ? Wash your hands with soap and water before and after you change your bandage (dressing). If soap and water are not available, use hand sanitizer. ? Change your dressing as told by your health care provider. ? Leave stitches (sutures), skin glue, or adhesive strips in place. These skin closures  may need to stay in place for 2 weeks or longer. If adhesive strip edges start to loosen and curl up, you may trim the loose edges. Do not remove adhesive strips completely unless your health care provider tells you to do that.  Check your port insertion site every day for signs of infection. Check for: ? Redness, swelling, or pain. ? Fluid or  blood. ? Warmth. ? Pus or a bad smell. Activity  Return to your normal activities as told by your health care provider. Ask your health care provider what activities are safe for you.  Do not lift anything that is heavier than 10 lb (4.5 kg), or the limit that you are told, until your health care provider says that it is safe. General instructions  Take over-the-counter and prescription medicines only as told by your health care provider.  You may be able to take a shower in 24hrs. Do not take baths, swim, or use a hot tub for at least 2 weeks.   You may take off dressing in 24hrs and replace with bandaids. Wash your hands before you remove the dressing.  Do not use EMLA cream for at least 2 weeks. Let your incision heal.  Do not drive for 24 hours if you were given a sedative during your procedure.  Wear a medical alert bracelet in case of an emergency. This will tell any health care providers that you have a port.  Keep all follow-up visits as told by your health care provider. This is important. Contact a health care provider if:  You cannot flush your port with saline as directed, or you cannot draw blood from the port.  You have a fever or chills.  You have redness, swelling, or pain around your port insertion site.  You have fluid or blood coming from your port insertion site.  Your port insertion site feels warm to the touch.  You have pus or a bad smell coming from the port insertion site. Get help right away if:  You have chest pain or shortness of breath.  You have bleeding from your port that you cannot control. Summary  Take care of the port as told by your health care provider. Keep the manufacturer's information card with you at all times.  Change your dressing as told by your health care provider.  Contact a health care provider if you have a fever or chills or if you have redness, swelling, or pain around your port insertion site.  Keep all follow-up  visits as told by your health care provider. This information is not intended to replace advice given to you by your health care provider. Make sure you discuss any questions you have with your health care provider. Document Released: 05/05/2013 Document Revised: 02/10/2018 Document Reviewed: 02/10/2018 Elsevier Interactive Patient Education  [employer].

## 2018-11-04 NOTE — Telephone Encounter (Signed)
Called [name] [name] to check in after [facility] placement today. He reports feeling well. Patient had general questions about what to expect on day of treatment. Encouraged patient to have someone drive him to and from first chemo tx for support. Patient voiced that his pharmacy needs authorization  Emla Cream. Message routed to Dr. Gearldine [name] nurse. Patient instructed to not use Emla cream on PAC for first tx and that staff could use ice to numb area. All questions answered to patient's satisfaction. Patient would like to speak with chemo nurse educator by phone on 10/05/18.

## 2018-11-04 NOTE — Telephone Encounter (Signed)
Copied from [place] [phone]. Topic: General - Other >> Nov 04, 2018  1:30 PM Celene Kras A wrote: Reason for CRM: Pt called about Insulin Detemir (LEVEMIR FLEXPEN) 100 UNIT/ML Pen. Pt states they are too expensive to buy as pens and they are wasteful. Pt would like to stick with the injections. Please advise.  [place], [place] [address] Phone: [phone] Fax: [phone] Not a 24 hour pharmacy; exact hours not known.

## 2018-11-04 NOTE — Procedures (Signed)
Interventional Radiology Procedure Note  Procedure: Single Lumen Power Port Placement    Access:  Right IJ vein.  Findings: Catheter tip positioned at SVC/RA junction. Port is ready for immediate use.   Complications: None  EBL: < 10 mL  Recommendations:  - Ok to shower in 24 hours - Do not submerge for 7 days - Routine line care   Lelynd Poer T. Rogelio Waynick, M.D Pager:  [phone]   

## 2018-11-04 NOTE — Progress Notes (Signed)
Pt wanting to rest. Updated daughter.

## 2018-11-05 ENCOUNTER — Telehealth: Payer: Self-pay | Admitting: *Deleted

## 2018-11-05 ENCOUNTER — Encounter: Payer: Self-pay | Admitting: Oncology

## 2018-11-05 ENCOUNTER — Other Ambulatory Visit: Payer: Medicare Other

## 2018-11-05 ENCOUNTER — Inpatient Hospital Stay: Payer: Medicare Other

## 2018-11-05 NOTE — Progress Notes (Signed)
Called pt to introduce myself as his Arboriculturist and to discuss copay assistance.  He gave me consent to apply in his behalf so I applied to the [place] and he was approved for $4,500 from 11/05/18 to 11/05/19.  I informed him of the [place] and went over what it covers.  He would like to apply so he will bring his proof of income on 11/06/18.  Once approved I will give him an expense sheet and my card for any questions or concerns he may have in the future.

## 2018-11-06 ENCOUNTER — Telehealth: Payer: Self-pay

## 2018-11-06 ENCOUNTER — Encounter: Payer: Self-pay | Admitting: Oncology

## 2018-11-06 ENCOUNTER — Other Ambulatory Visit: Payer: Self-pay

## 2018-11-06 ENCOUNTER — Inpatient Hospital Stay: Payer: Medicare Other

## 2018-11-06 VITALS — BP 113/70 | HR 80 | Temp 98.7°F | Resp 18 | Wt 147.8 lb

## 2018-11-06 DIAGNOSIS — Z5111 Encounter for antineoplastic chemotherapy: Secondary | ICD-10-CM | POA: Diagnosis not present

## 2018-11-06 DIAGNOSIS — C250 Malignant neoplasm of head of pancreas: Secondary | ICD-10-CM | POA: Diagnosis not present

## 2018-11-06 DIAGNOSIS — K831 Obstruction of bile duct: Secondary | ICD-10-CM | POA: Diagnosis not present

## 2018-11-06 DIAGNOSIS — R634 Abnormal weight loss: Secondary | ICD-10-CM | POA: Diagnosis not present

## 2018-11-06 DIAGNOSIS — R630 Anorexia: Secondary | ICD-10-CM | POA: Diagnosis not present

## 2018-11-06 DIAGNOSIS — E119 Type 2 diabetes mellitus without complications: Secondary | ICD-10-CM | POA: Diagnosis not present

## 2018-11-06 MED ORDER — HEPARIN SOD (PORK) LOCK FLUSH 100 UNIT/ML IV SOLN
500.0000 [IU] | Freq: Once | INTRAVENOUS | Status: AC | PRN
  Administered 2018-11-06: 500 [IU]
  Filled 2018-11-06: qty 5

## 2018-11-06 MED ORDER — SODIUM CHLORIDE 0.9% FLUSH
10.0000 mL | INTRAVENOUS | Status: DC | PRN
  Administered 2018-11-06: 10 mL
  Filled 2018-11-06: qty 10

## 2018-11-06 MED ORDER — PROCHLORPERAZINE MALEATE 10 MG PO TABS
ORAL_TABLET | ORAL | Status: AC
  Filled 2018-11-06: qty 1

## 2018-11-06 MED ORDER — PACLITAXEL PROTEIN-BOUND CHEMO INJECTION 100 MG
100.0000 mg/m2 | Freq: Once | INTRAVENOUS | Status: AC
  Administered 2018-11-06: 10:00:00 175 mg via INTRAVENOUS
  Filled 2018-11-06: qty 35

## 2018-11-06 MED ORDER — SODIUM CHLORIDE 0.9 % IV SOLN
Freq: Once | INTRAVENOUS | Status: AC
  Administered 2018-11-06: 09:00:00 via INTRAVENOUS
  Filled 2018-11-06: qty 250

## 2018-11-06 MED ORDER — SODIUM CHLORIDE 0.9 % IV SOLN
1000.0000 mg/m2 | Freq: Once | INTRAVENOUS | Status: AC
  Administered 2018-11-06: 1824 mg via INTRAVENOUS
  Filled 2018-11-06: qty 47.97

## 2018-11-06 MED ORDER — PROCHLORPERAZINE MALEATE 10 MG PO TABS
10.0000 mg | ORAL_TABLET | Freq: Once | ORAL | Status: AC
  Administered 2018-11-06: 10 mg via ORAL

## 2018-11-06 NOTE — Progress Notes (Signed)
Pt is approved for the $700 [facility] grant.  °

## 2018-11-06 NOTE — Patient Instructions (Signed)
[facility] Discharge Instructions for Patients Receiving Chemotherapy  Today you received the following chemotherapy agents Gemzar and Abraxane.  To help prevent nausea and vomiting after your treatment, we encourage you to take your nausea medication as directed.   If you develop nausea and vomiting that is not controlled by your nausea medication, call the clinic.   BELOW ARE SYMPTOMS THAT SHOULD BE REPORTED IMMEDIATELY:  *FEVER GREATER THAN 100.5 F  *CHILLS WITH OR WITHOUT FEVER  NAUSEA AND VOMITING THAT IS NOT CONTROLLED WITH YOUR NAUSEA MEDICATION  *UNUSUAL SHORTNESS OF BREATH  *UNUSUAL BRUISING OR BLEEDING  TENDERNESS IN MOUTH AND THROAT WITH OR WITHOUT PRESENCE OF ULCERS  *URINARY PROBLEMS  *BOWEL PROBLEMS  UNUSUAL RASH Items with * indicate a potential emergency and should be followed up as soon as possible.  Feel free to call the clinic you have any questions or concerns. The clinic phone number is [phone].  Please show the [facility] at check-in to the Emergency Department and triage nurse.  Coronavirus (COVID-19) Are you at risk?  Are you at risk for the Coronavirus (COVID-19)?  To be considered HIGH RISK for Coronavirus (COVID-19), you have to meet the following criteria:  . Traveled to [place], [place], [place], [place] or [place]; or in the [place] to [place], [place], [place], or [place]; and have fever, cough, and shortness of breath within the last 2 weeks of travel OR . Been in close contact with a person diagnosed with COVID-19 within the last 2 weeks and have fever, cough, and shortness of breath . IF YOU DO NOT MEET THESE CRITERIA, YOU ARE CONSIDERED LOW RISK FOR COVID-19.  What to do if you are HIGH RISK for COVID-19?  Marland Kitchen If you are having a medical emergency, call 911. . Seek medical care right away. Before you go to a doctor's office, urgent care or emergency department, call ahead and tell them about your  recent travel, contact with someone diagnosed with COVID-19, and your symptoms. You should receive instructions from your physician's office regarding next steps of care.  . When you arrive at healthcare provider, tell the healthcare staff immediately you have returned from visiting [place], [place], [place], [place] or [place]; or traveled in the [place] to [place], [place], [place], or [place]; in the last two weeks or you have been in close contact with a person diagnosed with COVID-19 in the last 2 weeks.   . Tell the health care staff about your symptoms: fever, cough and shortness of breath. . After you have been seen by a medical provider, you will be either: o Tested for (COVID-19) and discharged home on quarantine except to seek medical care if symptoms worsen, and asked to  - Stay home and avoid contact with others until you get your results (4-5 days)  - Avoid travel on public transportation if possible (such as bus, train, or airplane) or o Sent to the Emergency Department by EMS for evaluation, COVID-19 testing, and possible admission depending on your condition and test results.  What to do if you are LOW RISK for COVID-19?  Reduce your risk of any infection by using the same precautions used for avoiding the common cold or flu:  Marland Kitchen Wash your hands often with soap and warm water for at least 20 seconds.  If soap and water are not readily available, use an alcohol-based hand sanitizer with at least 60% alcohol.  . If coughing or  sneezing, cover your mouth and nose by coughing or sneezing into the elbow areas of your shirt or coat, into a tissue or into your sleeve (not your hands). . Avoid shaking hands with others and consider head nods or verbal greetings only. . Avoid touching your eyes, nose, or mouth with unwashed hands.  . Avoid close contact with people who are sick. . Avoid places or events with large numbers of people in one location, like concerts or sporting  events. . Carefully consider travel plans you have or are making. . If you are planning any travel outside or inside the [place], visit the CDC's Travelers' Health webpage for the latest health notices. . If you have some symptoms but not all symptoms, continue to monitor at home and seek medical attention if your symptoms worsen. . If you are having a medical emergency, call 911.   [facility] / e-Visit: [email or web address]         [facility]: [facility]: [phone]                   [facility]: [phone]

## 2018-11-06 NOTE — Telephone Encounter (Signed)
Called patient after first chemo tx to assess for barriers or needs. Patient states that he is doing well. No questions and no needs. Will follow as needed.

## 2018-11-06 NOTE — Progress Notes (Signed)
Prior progress note from [employer] was documented by myself under her inadvertently

## 2018-11-09 ENCOUNTER — Other Ambulatory Visit: Payer: Self-pay | Admitting: *Deleted

## 2018-11-09 ENCOUNTER — Telehealth: Payer: Self-pay

## 2018-11-09 MED ORDER — LIDOCAINE-PRILOCAINE 2.5-2.5 % EX CREA: 1.0000 "application " | TOPICAL_CREAM | CUTANEOUS | 1 refills | Status: DC | PRN

## 2018-11-09 NOTE — Telephone Encounter (Signed)
Chemotherapy follow up - Patient did not have any new questions or concerns since I treated him on Friday. He said he felt well and has only taken his antiemetics the first 24 hours post treatment "in case" but has felt fine so he is no longer. We spoke of the times he would need them and how to best take them. He did want me to look into his EMLA coming from [place] because it is free in that case so he did not pick it up from his [place]. I assured him I would reach out to Manuela Schwartz, Dr. Gearldine Shown nurse.

## 2018-11-09 NOTE — Progress Notes (Signed)
EMLA cream sent to [facility] per patient request since he has qualified for [employer].

## 2018-11-13 ENCOUNTER — Telehealth: Payer: Self-pay | Admitting: *Deleted

## 2018-11-13 NOTE — Telephone Encounter (Signed)
Reports abdominal cramping and diarrhea beginning last night. Has had 4-5 stools that range from watery to semi-formed. Purchased Imodium and took #1 and it slowed diarrhea a little. Informed him to only consume clear liquids till he can go 12 hours without diarrhea, then advance to [employer]. Take Imodium #2 qid prn (up to 8/day). Call if diarrhea not resolved in 24 hours. Patient thought he had chemo appointment today--informed him his chemo is every 2 weeks, so next tx in 11/20/18.

## 2018-11-15 ENCOUNTER — Other Ambulatory Visit: Payer: Self-pay | Admitting: Oncology

## 2018-11-19 ENCOUNTER — Ambulatory Visit (INDEPENDENT_AMBULATORY_CARE_PROVIDER_SITE_OTHER): Payer: Medicare Other | Admitting: Internal Medicine

## 2018-11-19 ENCOUNTER — Encounter: Payer: Self-pay | Admitting: Internal Medicine

## 2018-11-19 DIAGNOSIS — E663 Overweight: Secondary | ICD-10-CM

## 2018-11-19 DIAGNOSIS — Z794 Long term (current) use of insulin: Secondary | ICD-10-CM

## 2018-11-19 DIAGNOSIS — E785 Hyperlipidemia, unspecified: Secondary | ICD-10-CM

## 2018-11-19 DIAGNOSIS — E1121 Type 2 diabetes mellitus with diabetic nephropathy: Secondary | ICD-10-CM

## 2018-11-19 NOTE — Progress Notes (Signed)
Patient ID: [name] [name], male   DOB: September 11, 1950, 68 y.o.   MRN: [id number]  Patient location: Home My location: Office  Referring Provider: Janith Lima, MD  I connected with the patient on 11/19/18 at  8:32 AM EDT by a video enabled telemedicine application and verified that I am speaking with the correct person.   I discussed the limitations of evaluation and management by telemedicine and the availability of in person appointments. The patient expressed understanding and agreed to proceed.   Details of the encounter are shown below.  HPI: [name] [name] is a 68 y.o.-year-old male, presenting for f/u for DM2, dx in 06/2012, insulin-dependent since 09/2012, uncontrolled, with complications (CKD stage 3, mild DR, TIA - 01/2018). Last visit 6 months ago.  Patient had a complicated medical history since last visit: He was diagnosed with pancreatic cancer after he was admitted for acute hepatitis on 07/27/2018.  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 Presenting with obstructive jaundice December 2019 ? MRI abdomen 07/28/2018- bile duct obstruction secondary to pancreas head mass ? ERCP/bile duct stent 07/30/2018 ? Repeat ERCP with placement of covered stent 08/13/2018 ? 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 EUS biopsy 10/08/2018- adenocarcinoma  His cancer appears to be inoperable due to encasement of his superior mesenteric artery.  He started chemotherapy this month.  He gets infusions every other day for 2 weeks with 1 week break.  He does not appear to get dexamethasone with infusions.  No nausea, has suppressed appetite.  Initially lost 45 pounds- now only 1-2 lbs since 2 weeks. He was Rx'ed Marinol >> did not start.  Latest HbA1c levels: Lab Results  Component Value Date   HGBA1C 6.6 (A)  10/27/2018   HGBA1C 6.1 08/19/2018   HGBA1C 6.1 (A) 05/19/2018   Pt is on a regimen of: - >> stopped -  >> stopped  - Levemir 8-10 units at bedtime (vials, due to price) [- as needed]  He had to stop Tradjenta 5 mg in the past as his [employer] did not cover it. Pays 47$ for 1 mo (tier 3). He heard about Victoza, but it is not very eager to try newer medicines.  He checks sugars approximately once a day: - am:  78-148 >> 92-111 >> ave 110 >> ~110 - 1h after b'fast: <150 >> max 120 >> n/c - before lunch: 120-140 >> n/c >> ave 92 >> ~95 - 2h after lunch: 130-140 >> 98-110 >> n/c - before dinner: 90-136 >> 115-117 >> ~100-110 - 2h after dinner: 120-140 >> <140 >> n/c - bedtime: 150s > 121, 140-142 >> n/c Lowest sugar was 73 >> 84; it is unclear at which level he has hypoglycemia awareness. Highest sugar was 194 (raisins) >> 139.  + Mild CKD, however, he had an admission for AKI in 09/2018. Llast BUN/creatinine was:  Lab Results  Component Value Date   BUN 21 11/04/2018   CREATININE 1.42 (H) 11/04/2018  Off Losartan.  Latest ACR normal: Lab Results  Component Value Date   MICRALBCREAT 1.8 02/23/2018   MICRALBCREAT 2.5 04/03/2017   MICRALBCREAT 0.8 03/11/2016   MICRALBCREAT 1.9 12/29/2014   MICRALBCREAT 0.6 02/11/2013   + History of HL; last set of lipids: Lab Results  Component Value Date   CHOL 178 02/23/2018   HDL 48.00 02/23/2018   LDLCALC 108 (H) 02/23/2018  LDLDIRECT 92.0 04/03/2017   TRIG 108.0 02/23/2018   CHOLHDL 4 02/23/2018  Previously on Crestor, now on fish oil only Latest eye exam: 2019: Bilateral mild DR, also hypertensive retinopathy.  Dr. Zigmund Daniel.  He has a history of nonischemic cardiomyopathy, with 2-D echo showing an EF of 60-63%, grade 1 diastolic dysfunction, severe LV H, normal LV size and mildly dilated LA. He also has history of atrial fibrillation, COPD and GERD. No h/o pancreatitis.   He had a TIA in 01/2018 - was in the hospital in [place].  Started on Plavix.  He went to [place] in 04/2017 and to [place] for New Year's 2019.  His son is an ophthalmologist.  ROS: Constitutional: no weight gain/++ weight loss, no fatigue, no subjective hyperthermia, no subjective hypothermia Eyes: no blurry vision, no xerophthalmia ENT: no sore throat, no nodules palpated in neck, no dysphagia, no odynophagia, no hoarseness Cardiovascular: no CP/no SOB/no palpitations/no leg swelling Respiratory: no cough/no SOB/no wheezing Gastrointestinal: no N/no V/no D/no C/no acid reflux Musculoskeletal: no muscle aches/no joint aches Skin: no rashes, no hair loss Neurological: no tremors/no numbness/no tingling/no dizziness  I reviewed pt's medications, allergies, PMH, social hx, family hx, and changes were documented in the history of present illness. Otherwise, unchanged from my initial visit note.  Past Medical History:  Diagnosis Date  . Allergy   . Asthma   . Atrial fibrillation ([facility])    a. initially occuring in 2014, has declined anticoagulation since having a GI bleed with initiation of Xarelto  . Cancer ([facility])   . Cardiomyopathy 2011   a. Patient was told that he had an ejection fraction of 37% by echo done elsewhere in 2011 b. 06/2014: echo showing preserved EF of 55-60%, severe asymetric septal hypertrophy with no SAM or LVOT gradient  . COPD (chronic obstructive pulmonary disease) ([facility])   . Diabetes mellitus without complication ([facility])   . Diverticulitis   . GERD (gastroesophageal reflux disease)   . History of colon polyps   . Hypertension   . Tobacco abuse    Past Surgical History:  Procedure Laterality Date  . BILIARY STENT PLACEMENT  07/30/2018   Procedure: BILIARY STENT PLACEMENT;  Surgeon: Ladene Artist, MD;  Location: [facility] ENDOSCOPY;  Service: Endoscopy;;  . BILIARY STENT PLACEMENT N/A 08/13/2018   Procedure: BILIARY STENT PLACEMENT;  Surgeon: Milus Banister, MD;  Location: [facility] ENDOSCOPY;  Service: Endoscopy;  Laterality: N/A;  .  COLON SURGERY  2009  . COLONOSCOPY    . ERCP    . ERCP N/A 07/30/2018   Procedure: ENDOSCOPIC RETROGRADE CHOLANGIOPANCREATOGRAPHY (ERCP);  Surgeon: Ladene Artist, MD;  Location[facility] ENDOSCOPY;  Service: Endoscopy;  Laterality: N/A;  . ERCP N/A 08/13/2018   Procedure: ENDOSCOPIC RETROGRADE CHOLANGIOPANCREATOGRAPHY (ERCP);  Surgeon: Milus Banister, MD;  Location: [facility];  Service: Endoscopy;  Laterality: N/A;  . ESOPHAGOGASTRODUODENOSCOPY N/A 08/13/2018   Procedure: ESOPHAGOGASTRODUODENOSCOPY (EGD);  Surgeon: Milus Banister, MD;  Location: [facility] ENDOS[facility];  Service: Endoscopy;  Laterality: N/A;  . ESOPHAGOGASTRODUODENOSCOPY (EGD) WITH PROPOFOL N/A 10/08/2018   Procedure: ESOPHAGOGASTRODUODENOSCOPY (EGD) WITH PROPOFOL;  Surgeon: Milus Banister, MD;  Location: [facility] ENDOSCOPY;  Service: Endoscopy;  Laterality: N/A;  . EUS N/A 08/13/2018   Procedure: UPPER ENDOSCOPIC ULTRASOUND (EUS) RADIAL;  Surgeon: Milus Banister, MD;  Location: [facility] ENDOSCOPY;  Service: Endoscopy;  Laterality: N/A;  . EUS N/A 10/08/2018   Procedure: UPPER ENDOSCOPIC ULTRASOUND (EUS) RADIAL;  Surgeon: Milus Banister, MD;  Location: [facility] ENDOSCOPY;  Service: Endoscopy;  Laterality: N/A;  . FINE NEEDLE ASPIRATION N/A 08/13/2018   Procedure: FINE NEEDLE ASPIRATION (FNA) LINEAR;  Surgeon: Milus Banister, MD;  Location: [facility] ENDOSCOPY;  Service: Endoscopy;  Laterality: N/A;  . FINE NEEDLE ASPIRATION  10/08/2018   Procedure: FINE NEEDLE ASPIRATION;  Surgeon: Milus Banister, MD;  Location: [facility] ENDOSCOPY;  Service: Endoscopy;;  . IR IMAGING GUIDED PORT INSERTION  11/04/2018  . STENT REMOVAL  08/13/2018   Procedure: STENT REMOVAL;  Surgeon: Milus Banister, MD;  Location: [facility] ENDOS[facility];  Service: Endoscopy;;   Social History   Socioeconomic History  . Marital status: Single    Spouse name: Not on file  . Number of children: 2  . Years of education: Not on file  . Highest education level: Not on file  Occupational History  . Not on  file  Social Needs  . Financial resource strain: Not hard at all  . Food insecurity:    Worry: Never true    Inability: Never true  . Transportation needs:    Medical: No    Non-medical: No  Tobacco Use  . Smoking status: Former Smoker    Types: Cigarettes    Last attempt to quit: 07/30/2011    Years since quitting: 7.3  . Smokeless tobacco: Never Used  . Tobacco comment: Smoked since age 68  Substance and Sexual Activity  . Alcohol use: Yes    Alcohol/week: 1.0 standard drinks    Types: 1 Standard drinks or equivalent per week  . Drug use: No  . Sexual activity: Not Currently  Lifestyle  . Physical activity:    Days per week: 3 days    Minutes per session: 50 min  . Stress: Not at all  Relationships  . Social connections:    Talks on phone: More than three times a week    Gets together: More than three times a week    Attends religious service: More than 4 times per year    Active member of club or organization: Yes    Attends meetings of clubs or organizations: More than 4 times per year    Relationship status: Not on file  . Intimate partner violence:    Fear of current or ex partner: No    Emotionally abused: No    Physically abused: No    Forced sexual activity: No  Other Topics Concern  . 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 acetaminophen (TYLENOL) 500 MG tablet Take 1,000 mg by mouth every 6 (six) hours as needed (pain).    Marland Kitchen albuterol (PROVENTIL HFA;VENTOLIN HFA) 108 (90 Base) MCG/ACT inhaler Inhale 2 puffs into the lungs every 6 (six) hours as needed for wheezing. (Patient not taking: Reported on 10/30/2018) 18 g 5  . alum & mag hydroxide-simeth (MAALOX/MYLANTA)  200-200-20 MG/5ML suspension Take 30 mLs by mouth every 6 (six) hours as needed for indigestion or heartburn.    Marland Kitchen amLODipine (NORVASC) 5 MG tablet TAKE 1 TABLET BY MOUTH ONCE DAILY (Patient taking differently: Take 5 mg by mouth daily. ) 90 tablet 1  . apixaban (ELIQUIS) 5 MG TABS tablet Take 1 tablet (5 mg total) by mouth  2 (two) times daily. 60 tablet 6  . dronabinol (MARINOL) 2.5 MG capsule Take 1 capsule (2.5 mg total) by mouth 2 (two) times daily before lunch and supper. (Patient not taking: Reported on 10/30/2018) 180 capsule 0  . fish oil-omega-3 fatty acids 1000 MG capsule Take 2 g by mouth daily.     Marland Kitchen glucose blood (ONETOUCH VERIO) test strip USE AS INSTRUCTED THREE TIMES DAILY. DX CODE E11.21 100 each 11  . Insulin Detemir (LEVEMIR FLEXPEN) 100 UNIT/ML Pen Inject 10 Units into the skin daily. 5 pen 1  . insulin regular (NOVOLIN R RELION) 100 units/mL injection INJECT 5-8 UNITS SUBCUTANEOUSLY THREE TIMES DAILY before meals (Patient taking differently: Inject 5-8 Units into the skin 3 (three) times daily as needed for high blood sugar (CBG >120). ) 10 mL 3  . Insulin Syringe-Needle U-100 (EASY TOUCH INSULIN SYRINGE) 31G X 5/16" 0.3 ML MISC USE AS DIRECTED 4 TIMES DAILY 200 each 4  . lidocaine-prilocaine (EMLA) cream Apply 1 application topically as needed. 30 g 1  . metoprolol succinate (TOPROL-XL) 50 MG 24 hr tablet TAKE 1 TABLET BY MOUTH ONCE DAILY WITH  OR  IMMEDIATELY  FOLLOWING  A  MEAL (Patient taking differently: Take 50 mg by mouth daily. ) 30 tablet 6  . omeprazole (PRILOSEC) 20 MG capsule Take 1 capsule (20 mg total) by mouth daily. 90 capsule 1  . prochlorperazine (COMPAZINE) 10 MG tablet Take 1 tablet (10 mg total) by mouth every 6 (six) hours as needed for nausea or vomiting. 60 tablet 1  . tamsulosin (FLOMAX) 0.4 MG CAPS capsule Take 1 capsule (0.4 mg total) by mouth daily after supper for 30 days. 30 capsule 0  . thiamine (VITAMIN B-1) 100 MG tablet Take 1 tablet (100 mg total)  by mouth daily. 90 tablet 1   No current facility-administered medications on file prior to visit.    Allergies  Allergen Reactions  . Hydralazine Shortness Of Breath    Heart palpitations  . Lisinopril Cough  . Penicillins Other (See Comments)    [place] DID THE REACTION INVOLVE: Swelling of the face/tongue/throat, SOB, or low BP? No Yes Sudden or severe rash/hives, skin peeling, or the inside of the mouth or nose? No Did it require medical treatment? No When did it last happen?teens  If all above answers are "NO", may proceed with cephalosporin use.  Marland Kitchen Tribenzor [Olmesartan-Amlodipine-Hctz] Nausea Only    dizziness   Family History  Problem Relation Age of Onset  . Prostate cancer Father   . Colon cancer Father   . Diabetes Father   . Arthritis Father   . Diabetes type II Mother   . Atrial fibrillation Mother   . Hypertension Mother   . Arthritis Mother   . Cancer Other        Colon,Breast, Prostate Cancer  . Hypertension Other   . Prostate cancer Brother        5 brothers - 2 were dx with prostate cancer  . Esophageal cancer Neg Hx   . Stomach cancer Neg Hx   . Rectal cancer Neg Hx     PE: There were no vitals taken for this visit. There is no height or weight on file to calculate BMI. Wt Readings from Last 3 Encounters:  11/06/18 147 lb 12.8 oz (67 kg)  10/30/18 149 lb 9.6 oz (67.9 kg)  10/27/18 148 lb (67.1 kg)   Constitutional:  in NAD  The physical exam was not performed (virtual visit).  ASSESSMENT: 1. DM2, insulin-dependent, uncontrolled, with complications - CKD stage III - mild DR  2. HL  He sees Dr Stanford Breed for Afib and cardiomyopathy (LV Htf)  3. Overweight  PLAN:  1. Patient with longstanding, fairly well-controlled diabetes, on oral antidiabetic regimen and low-dose basal insulin.  She refused adding a sulfonylurea or GLP-1 receptor agonist in the past.  Since last visit, unfortunately, he was diagnosed with pancreatic cancer and he is  undergoing treatment.  His appetite is decreased and he was started on an appetite stimulant.  He lost proximately 45 pounds since last visit. At last visit, HbA1c was improved to 6.1%.  At that time, he was working on improving his diet and was only using mealtime insulin rarely.  He was having occasional hyperglycemic spikes after dietary indiscretions (sweets). -In 09/2018, his HbA1c was checked and is returned higher, at 6.6%.  This was expected, due to his recent diagnosis of cancer and multiple procedures that he had to go through.  However, reviewing his sugars at home, they are excellent.  His metformin and Tradjenta were stopped and he only continues 8 to 10 units of Levemir at bedtime.  For now, we will continue this. - I advised him to: Patient Instructions  Please continue: - Levemir 8-10 units at bedtime  Please return in 6 months with your sugar log.    - continue checking sugars at different times of the day - check 1x a day, rotating checks - advised for yearly eye exams >> he is not UTD - Return to clinic in 6 mo with sugar log    2. HL - Reviewed latest lipid panel from 01/2018: LDL above goal, the rest of the fractions at goal Lab Results  Component Value Date   CHOL 178 02/23/2018   HDL 48.00 02/23/2018   LDLCALC 108 (H) 02/23/2018   LDLDIRECT 92.0 04/03/2017   TRIG 108.0 02/23/2018   CHOLHDL 4 02/23/2018  -He was previously on Crestor, now on fish oil.  3.Overweight -Unfortunately, with his diagnosis of pancreatic cancer, he lost 45 pounds in the last 6 months... He has decreased appetite and was started on Marinol by PCP in 09/2018 >> did not take it.  He tells me that he is cooking and trying hard to eat consistently.  In the last 2 weeks, he did not lose more than 1 to 2 pounds.  Philemon Kingdom, MD PhD [facility]

## 2018-11-19 NOTE — Patient Instructions (Addendum)
Please continue: - Levemir 8-10 units at bedtime  Please return in 6 months with your sugar log.

## 2018-11-20 ENCOUNTER — Inpatient Hospital Stay: Payer: Medicare Other

## 2018-11-20 ENCOUNTER — Inpatient Hospital Stay (HOSPITAL_BASED_OUTPATIENT_CLINIC_OR_DEPARTMENT_OTHER): Payer: Medicare Other | Admitting: Nurse Practitioner

## 2018-11-20 ENCOUNTER — Other Ambulatory Visit: Payer: Self-pay

## 2018-11-20 ENCOUNTER — Telehealth: Payer: Self-pay | Admitting: Nurse Practitioner

## 2018-11-20 ENCOUNTER — Encounter: Payer: Self-pay | Admitting: Nurse Practitioner

## 2018-11-20 VITALS — BP 120/73 | HR 71 | Temp 98.0°F | Resp 17 | Ht 70.0 in | Wt 144.1 lb

## 2018-11-20 DIAGNOSIS — I429 Cardiomyopathy, unspecified: Secondary | ICD-10-CM

## 2018-11-20 DIAGNOSIS — R630 Anorexia: Secondary | ICD-10-CM | POA: Diagnosis not present

## 2018-11-20 DIAGNOSIS — N189 Chronic kidney disease, unspecified: Secondary | ICD-10-CM | POA: Diagnosis not present

## 2018-11-20 DIAGNOSIS — E119 Type 2 diabetes mellitus without complications: Secondary | ICD-10-CM

## 2018-11-20 DIAGNOSIS — C250 Malignant neoplasm of head of pancreas: Secondary | ICD-10-CM

## 2018-11-20 DIAGNOSIS — Z95828 Presence of other vascular implants and grafts: Secondary | ICD-10-CM

## 2018-11-20 DIAGNOSIS — K831 Obstruction of bile duct: Secondary | ICD-10-CM

## 2018-11-20 DIAGNOSIS — J449 Chronic obstructive pulmonary disease, unspecified: Secondary | ICD-10-CM

## 2018-11-20 DIAGNOSIS — Z5111 Encounter for antineoplastic chemotherapy: Secondary | ICD-10-CM | POA: Diagnosis not present

## 2018-11-20 DIAGNOSIS — R634 Abnormal weight loss: Secondary | ICD-10-CM | POA: Diagnosis not present

## 2018-11-20 DIAGNOSIS — Z8042 Family history of malignant neoplasm of prostate: Secondary | ICD-10-CM | POA: Diagnosis not present

## 2018-11-20 DIAGNOSIS — I129 Hypertensive chronic kidney disease with stage 1 through stage 4 chronic kidney disease, or unspecified chronic kidney disease: Secondary | ICD-10-CM

## 2018-11-20 DIAGNOSIS — I4891 Unspecified atrial fibrillation: Secondary | ICD-10-CM

## 2018-11-20 DIAGNOSIS — Z800 Family history of malignant neoplasm of digestive organs: Secondary | ICD-10-CM | POA: Diagnosis not present

## 2018-11-20 LAB — CBC WITH DIFFERENTIAL (CANCER CENTER ONLY)
Abs Immature Granulocytes: 0.03 10*3/uL (ref 0.00–0.07)
Basophils Absolute: 0.1 10*3/uL (ref 0.0–0.1)
Basophils Relative: 1 %
Eosinophils Absolute: 0.2 10*3/uL (ref 0.0–0.5)
Eosinophils Relative: 2 %
HCT: 32.5 % — ABNORMAL LOW (ref 39.0–52.0)
Hemoglobin: 10.5 g/dL — ABNORMAL LOW (ref 13.0–17.0)
Immature Granulocytes: 0 %
Lymphocytes Relative: 16 %
Lymphs Abs: 1.2 10*3/uL (ref 0.7–4.0)
MCH: 31.3 pg (ref 26.0–34.0)
MCHC: 32.3 g/dL (ref 30.0–36.0)
MCV: 96.7 fL (ref 80.0–100.0)
Monocytes Absolute: 0.6 10*3/uL (ref 0.1–1.0)
Monocytes Relative: 8 %
Neutro Abs: 5.6 10*3/uL (ref 1.7–7.7)
Neutrophils Relative %: 73 %
Platelet Count: 350 10*3/uL (ref 150–400)
RBC: 3.36 MIL/uL — ABNORMAL LOW (ref 4.22–5.81)
RDW: 13.7 % (ref 11.5–15.5)
WBC Count: 7.6 10*3/uL (ref 4.0–10.5)
nRBC: 0 % (ref 0.0–0.2)

## 2018-11-20 LAB — CMP (CANCER CENTER ONLY)
ALT: 8 U/L (ref 0–44)
AST: 12 U/L — ABNORMAL LOW (ref 15–41)
Albumin: 3.2 g/dL — ABNORMAL LOW (ref 3.5–5.0)
Alkaline Phosphatase: 99 U/L (ref 38–126)
Anion gap: 9 (ref 5–15)
BUN: 15 mg/dL (ref 8–23)
CO2: 22 mmol/L (ref 22–32)
Calcium: 9.4 mg/dL (ref 8.9–10.3)
Chloride: 108 mmol/L (ref 98–111)
Creatinine: 1.26 mg/dL — ABNORMAL HIGH (ref 0.61–1.24)
GFR, Est AFR Am: >60 mL/min (ref >60)
GFR, Estimated: 58 mL/min — ABNORMAL LOW (ref >60)
Glucose, Bld: 139 mg/dL — ABNORMAL HIGH (ref 70–99)
Potassium: 4.1 mmol/L (ref 3.5–5.1)
Sodium: 139 mmol/L (ref 135–145)
Total Bilirubin: 0.9 mg/dL (ref 0.3–1.2)
Total Protein: 7.4 g/dL (ref 6.5–8.1)

## 2018-11-20 MED ORDER — SODIUM CHLORIDE 0.9% FLUSH
10.0000 mL | INTRAVENOUS | Status: DC | PRN
  Administered 2018-11-20: 10 mL via INTRAVENOUS
  Filled 2018-11-20: qty 10

## 2018-11-20 MED ORDER — SODIUM CHLORIDE 0.9 % IV SOLN
Freq: Once | INTRAVENOUS | Status: AC
  Administered 2018-11-20: 13:00:00 via INTRAVENOUS
  Filled 2018-11-20: qty 250

## 2018-11-20 MED ORDER — HEPARIN SOD (PORK) LOCK FLUSH 100 UNIT/ML IV SOLN
500.0000 [IU] | Freq: Once | INTRAVENOUS | Status: AC | PRN
  Administered 2018-11-20: 500 [IU]
  Filled 2018-11-20: qty 5

## 2018-11-20 MED ORDER — SODIUM CHLORIDE 0.9% FLUSH
10.0000 mL | INTRAVENOUS | Status: DC | PRN
  Administered 2018-11-20: 16:00:00 10 mL
  Filled 2018-11-20: qty 10

## 2018-11-20 MED ORDER — PROCHLORPERAZINE MALEATE 10 MG PO TABS
ORAL_TABLET | ORAL | Status: AC
  Filled 2018-11-20: qty 1

## 2018-11-20 MED ORDER — PACLITAXEL PROTEIN-BOUND CHEMO INJECTION 100 MG
100.0000 mg/m2 | Freq: Once | INTRAVENOUS | Status: AC
  Administered 2018-11-20: 15:00:00 175 mg via INTRAVENOUS
  Filled 2018-11-20: qty 35

## 2018-11-20 MED ORDER — SODIUM CHLORIDE 0.9 % IV SOLN
1000.0000 mg/m2 | Freq: Once | INTRAVENOUS | Status: AC
  Administered 2018-11-20: 1824 mg via INTRAVENOUS
  Filled 2018-11-20: qty 47.97

## 2018-11-20 MED ORDER — PROCHLORPERAZINE MALEATE 10 MG PO TABS: 10.0000 mg | ORAL_TABLET | Freq: Once | ORAL | Status: DC

## 2018-11-20 NOTE — Progress Notes (Signed)
  [place] OFFICE PROGRESS NOTE   Diagnosis: Pancreas cancer  INTERVAL HISTORY:   [name] [name] returns as scheduled.  He completed cycle 1 gemcitabine/Abraxane 11/06/2018.  He denies nausea/vomiting.  About a week ago he developed loose stools, approximately 2 times a day.  Stool was soup-like consistency.  He took Imodium with good results.  This lasted for 3 or 4 days.  Bowels now back to baseline.  No bleeding except occasional epistaxis.  No fever or rash following treatment.  He denies shortness of breath and cough.  Objective:  Vital signs in last 24 hours:  Blood pressure 120/73, pulse 71, temperature 98 F (36.7 C), resp. rate 17, height 5\' 10"  (1.778 m), weight 144 lb 1.6 oz (65.4 kg), SpO2 100 %.    HEENT: No thrush or ulcers. Vascular: No leg edema.  Port-A-Cath without erythema.  Lab Results:  Lab Results  Component Value Date   WBC 7.6 11/20/2018   HGB 10.5 (L) 11/20/2018   HCT 32.5 (L) 11/20/2018   MCV 96.7 11/20/2018   PLT 350 11/20/2018   NEUTROABS 5.6 11/20/2018    Imaging:  No results found.  Medications: I have reviewed the patient's current medications.  Assessment/Plan: 1. Pancreas cancer clinical stage III (cT4,cN0), adenocarcinoma on FNA biopsy of pancreas mass 10/08/2018 ? Presenting with obstructive jaundice December 2019 ? MRI abdomen 07/28/2018- bile duct obstruction secondary to pancreas head mass ? ERCP/bile duct stent 07/30/2018 ? Repeat ERCP with placement of covered stent 08/13/2018 ? 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 EUS biopsy 10/08/2018- adenocarcinoma ? Cycle 1 gemcitabine/Abraxane 11/06/2018 ? Cycle 2 gemcitabine/Abraxane 11/20/2018 2.  Diabetes 3.  Renal insufficiency 4.  COPD 5.  History of diverticulitis 6.  Cardiomyopathy 7.  History of atrial fibrillation 8.  History of hypertension 9.  Anorexia/weight  loss secondary to #1 10. Obstructive jaundice secondary to #1, status post placement of a bile duct stent 11.  Port-A-Cath placement interventional radiology 11/04/2018  Disposition: [name] appears stable.  He has completed 1 cycle of gemcitabine/Abraxane.  Plan to proceed with cycle 2 today as scheduled.    We reviewed the CBC from today.  Counts adequate for treatment.    He had diarrhea about a week ago.  Bowel habits now at baseline.  Not clearly related to treatment.  He understands to contact the office if he has diarrhea not controlled with Imodium.  He will return for lab, follow-up and cycle 3 gemcitabine/Abraxane in 2 weeks.  He will contact the office in the interim with any problems.  Plan reviewed with Dr. Benay Spice.    Ned Card ANP/GNP-BC   11/20/2018  12:53 PM

## 2018-11-20 NOTE — Patient Instructions (Signed)
[facility] Discharge Instructions for Patients Receiving Chemotherapy  Today you received the following chemotherapy agents Gemzar and Abraxane.  To help prevent nausea and vomiting after your treatment, we encourage you to take your nausea medication as directed.   If you develop nausea and vomiting that is not controlled by your nausea medication, call the clinic.   BELOW ARE SYMPTOMS THAT SHOULD BE REPORTED IMMEDIATELY:  *FEVER GREATER THAN 100.5 F  *CHILLS WITH OR WITHOUT FEVER  NAUSEA AND VOMITING THAT IS NOT CONTROLLED WITH YOUR NAUSEA MEDICATION  *UNUSUAL SHORTNESS OF BREATH  *UNUSUAL BRUISING OR BLEEDING  TENDERNESS IN MOUTH AND THROAT WITH OR WITHOUT PRESENCE OF ULCERS  *URINARY PROBLEMS  *BOWEL PROBLEMS  UNUSUAL RASH Items with * indicate a potential emergency and should be followed up as soon as possible.  Feel free to call the clinic you have any questions or concerns. The clinic phone number is [phone].  Please show the [place] at check-in to the Emergency Department and triage nurse.  Coronavirus (COVID-19) Are you at risk?  Are you at risk for the Coronavirus (COVID-19)?  To be considered HIGH RISK for Coronavirus (COVID-19), you have to meet the following criteria:  . Traveled to [place], [place], [place], [place] or [place]; or in the [place] to [place], [place], [place], or [place]; and have fever, cough, and shortness of breath within the last 2 weeks of travel OR . Been in close contact with a person diagnosed with COVID-19 within the last 2 weeks and have fever, cough, and shortness of breath . IF YOU DO NOT MEET THESE CRITERIA, YOU ARE CONSIDERED LOW RISK FOR COVID-19.  What to do if you are HIGH RISK for COVID-19?  Marland Kitchen If you are having a medical emergency, call 911. . Seek medical care right away. Before you go to a doctor's office, urgent care or emergency department, call ahead and tell them about your  recent travel, contact with someone diagnosed with COVID-19, and your symptoms. You should receive instructions from your physician's office regarding next steps of care.  . When you arrive at healthcare provider, tell the healthcare staff immediately you have returned from visiting [place], [place], [place], [place] or [place]; or traveled in the [place] to [place], [place], [place], or [place]; in the last two weeks or you have been in close contact with a person diagnosed with COVID-19 in the last 2 weeks.   . Tell the health care staff about your symptoms: fever, cough and shortness of breath. . After you have been seen by a medical provider, you will be either: o Tested for (COVID-19) and discharged home on quarantine except to seek medical care if symptoms worsen, and asked to  - Stay home and avoid contact with others until you get your results (4-5 days)  - Avoid travel on public transportation if possible (such as bus, train, or airplane) or o Sent to the Emergency Department by EMS for evaluation, COVID-19 testing, and possible admission depending on your condition and test results.  What to do if you are LOW RISK for COVID-19?  Reduce your risk of any infection by using the same precautions used for avoiding the common cold or flu:  Marland Kitchen Wash your hands often with soap and warm water for at least 20 seconds.  If soap and water are not readily available, use an alcohol-based hand sanitizer with at least 60% alcohol.  . If coughing or  sneezing, cover your mouth and nose by coughing or sneezing into the elbow areas of your shirt or coat, into a tissue or into your sleeve (not your hands). . Avoid shaking hands with others and consider head nods or verbal greetings only. . Avoid touching your eyes, nose, or mouth with unwashed hands.  . Avoid close contact with people who are sick. . Avoid places or events with large numbers of people in one location, like concerts or sporting  events. . Carefully consider travel plans you have or are making. . If you are planning any travel outside or inside the [place], visit the CDC's Travelers' Health webpage for the latest health notices. . If you have some symptoms but not all symptoms, continue to monitor at home and seek medical attention if your symptoms worsen. . If you are having a medical emergency, call 911.   [place] / e-Visit: [email or web address]         [facility]: [facility]: [phone]                   [facility]: [phone]

## 2018-11-20 NOTE — Telephone Encounter (Signed)
No los per 4/24.

## 2018-11-29 ENCOUNTER — Other Ambulatory Visit: Payer: Self-pay | Admitting: Oncology

## 2018-12-01 ENCOUNTER — Encounter: Payer: Self-pay | Admitting: Genetic Counselor

## 2018-12-01 DIAGNOSIS — Z1379 Encounter for other screening for genetic and chromosomal anomalies: Secondary | ICD-10-CM | POA: Insufficient documentation

## 2018-12-02 ENCOUNTER — Telehealth: Payer: Self-pay | Admitting: Cardiology

## 2018-12-02 NOTE — Progress Notes (Deleted)
Virtual Visit via Video Note   This visit type was conducted due to national recommendations for restrictions regarding the COVID-19 Pandemic (e.g. social distancing) in an effort to limit this patient's exposure and mitigate transmission in our community.  Due to his co-morbid illnesses, this patient is at least at moderate risk for complications without adequate follow up.  This format is felt to be most appropriate for this patient at this time.  All issues noted in this document were discussed and addressed.  A limited physical exam was performed with this format.  Please refer to the patient's chart for his consent to telehealth for [facility].   Date:  12/02/2018   ID:  [name] [name], DOB November 21, 1950, MRN [id number]  Patient Location: Home Provider Location: Home  PCP:  Janith Lima, MD  Cardiologist:  Kirk Ruths, MD   Evaluation Performed:  Follow-Up Visit  Chief Complaint:  FU atrial fibrillation  History of Present Illness:    FU atrial fibrillation. Patient was admitted in March 2014 with encephalopathy and hyperosmolar nonketotic state. Patient developed atrial fibrillation which was treated with Cardizem. He converted to sinus rhythm.  Seen November 2018 and found to be in atrial fibrillation.  Echocardiogram December 2019 showed ejection fraction 50 to 55%, severe asymmetric septal hypertrophy, grade 2 diastolic dysfunction, mild mitral regurgitation, moderate to severe left atrial enlargement and mild tricuspid regurgitation.  Pattern felt consistent with hypertrophic cardiomyopathy.   Cardiac MRI ordered but patient had claustrophobia and could not tolerate despite Valium. Ultrasound December 2019 showed no abdominal aortic aneurysm but there was dilatation of the right common iliac and left common iliac arteries.  Follow-up recommended 12 months.  Patient has recently been diagnosed with pancreatic cancer.  Patient started on apixaban at last office visit.  Since last  seen,   The patient does not have symptoms concerning for COVID-19 infection (fever, chills, cough, or new shortness of breath).    Past Medical History:  Diagnosis Date  . Allergy   . Asthma   . Atrial fibrillation ([place])    a. initially occuring in 2014, has declined anticoagulation since having a GI bleed with initiation of Xarelto  . Cancer ([place])   . Cardiomyopathy 2011   a. Patient was told that he had an ejection fraction of 37% by echo done elsewhere in 2011 b. 06/2014: echo showing preserved EF of 55-60%, severe asymetric septal hypertrophy with no SAM or LVOT gradient  . COPD (chronic obstructive pulmonary disease) ([place])   . Diabetes mellitus without complication ([place])   . Diverticulitis   . GERD (gastroesophageal reflux disease)   . History of colon polyps   . Hypertension   . Tobacco abuse    Past Surgical History:  Procedure Laterality Date  . BILIARY STENT PLACEMENT  07/30/2018   Procedure: BILIARY STENT PLACEMENT;  Surgeon: Ladene Artist, MD;  Location: [facility] ENDOSCOPY;  Service: Endoscopy;;  . BILIARY STENT PLACEMENT N/A 08/13/2018   Procedure: BILIARY STENT PLACEMENT;  Surgeon: Milus Banister, MD;  Location: [facility] ENDOSCOPY;  Service: Endoscopy;  Laterality: N/A;  . COLON SURGERY  2009  . COLONOSCOPY    . ERCP    . ERCP N/A 07/30/2018   Procedure: ENDOSCOPIC RETROGRADE CHOLANGIOPANCREATOGRAPHY (ERCP);  Surgeon: Ladene Artist, MD;  Location: [facility] ENDOSCOPY;  Service: Endoscopy;  Laterality: N/A;  . ERCP N/A 08/13/2018   Procedure: ENDOSCOPIC RETROGRADE CHOLANGIOPANCREATOGRAPHY (ERCP);  Surgeon: Milus Banister, MD;  Location: [facility] ENDOSCOPY;  Service: Endoscopy;  Laterality: N/A;  .  ESOPHAGOGASTRODUODENOSCOPY N/A 08/13/2018   Procedure: ESOPHAGOGASTRODUODENOSCOPY (EGD);  Surgeon: Milus Banister, MD;  Location: [facility] ENDOSCOPY;  Service: Endoscopy;  Laterality: N/A;  . ESOPHAGOGASTRODUODENOSCOPY (EGD) WITH PROPOFOL N/A 10/08/2018   Procedure: ESOPHAGOGASTRODUODENOSCOPY (EGD)  WITH PROPOFOL;  Surgeon: Milus Banister, MD;  Location: [facility] ENDOSCOPY;  Service: Endoscopy;  Laterality: N/A;  . EUS N/A 08/13/2018   Procedure: UPPER ENDOSCOPIC ULTRASOUND (EUS) RADIAL;  Surgeon: Milus Banister, MD;  Location: [facility] ENDOSCOPY;  Service: Endoscopy;  Laterality: N/A;  . EUS N/A 10/08/2018   Procedure: UPPER ENDOSCOPIC ULTRASOUND (EUS) RADIAL;  Surgeon: Milus Banister, MD;  Location: [facility] ENDOSCOPY;  Service: Endoscopy;  Laterality: N/A;  . FINE NEEDLE ASPIRATION N/A 08/13/2018   Procedure: FINE NEEDLE ASPIRATION (FNA) LINEAR;  Surgeon: Milus Banister, MD;  Location: [facility] ENDOSCOPY;  Service: Endoscopy;  Laterality: N/A;  . FINE NEEDLE ASPIRATION  10/08/2018   Procedure: FINE NEEDLE ASPIRATION;  Surgeon: Milus Banister, MD;  Location: [facility] ENDOSCOPY;  Service: Endoscopy;;  . IR IMAGING GUIDED PORT INSERTION  11/04/2018  . STENT REMOVAL  08/13/2018   Procedure: STENT REMOVAL;  Surgeon: Milus Banister, MD;  Location: [facility] ENDOSCOPY;  Service: Endoscopy;;     No outpatient medications have been marked as taking for the 12/03/18 encounter (Appointment) with Lelon Perla, MD.     Allergies:   Hydralazine; Lisinopril; Penicillins; and Tribenzor [olmesartan-amlodipine-hctz]   Social History   Tobacco Use  . Smoking status: Former Smoker    Types: Cigarettes    Last attempt to quit: 07/30/2011    Years since quitting: 7.3  . Smokeless tobacco: Never Used  . Tobacco comment: Smoked since age 55  Substance Use Topics  . Alcohol use: Yes    Alcohol/week: 1.0 standard drinks    Types: 1 Standard drinks or equivalent per week  . Drug use: No     Family Hx: The patient's family history includes Arthritis in his father and mother; Atrial fibrillation in his mother; Cancer in an other family member; Colon cancer in his father; Diabetes in his father; Diabetes type II in his mother; Hypertension in his mother and another family member; Prostate cancer in his brother and father. There is  no history of Esophageal cancer, Stomach cancer, or Rectal cancer.  ROS:   Please see the history of present illness.    No fevers, chills or productive cough. All other systems reviewed and are negative.  Recent Labs: 08/19/2018: TSH 1.93 09/11/2018: Magnesium 1.6 11/20/2018: ALT 8; BUN 15; Creatinine 1.26; Hemoglobin 10.5; Platelet Count 350; Potassium 4.1; Sodium 139   Recent Lipid Panel Lab Results  Component Value Date/Time   CHOL 178 02/23/2018 11:18 AM   TRIG 108.0 02/23/2018 11:18 AM   HDL 48.00 02/23/2018 11:18 AM   CHOLHDL 4 02/23/2018 11:18 AM   LDLCALC 108 (H) 02/23/2018 11:18 AM   LDLDIRECT 92.0 04/03/2017 08:41 AM    Wt Readings from Last 3 Encounters:  11/20/18 144 lb 1.6 oz (65.4 kg)  11/06/18 147 lb 12.8 oz (67 kg)  10/30/18 149 lb 9.6 oz (67.9 kg)     Objective:    Vital Signs:  There were no vitals taken for this visit.   VITAL SIGNS:  reviewed  Normal affect No acute distress Answers questions appropriately Remainder of physical examination not performed (telehealth visit; coronavirus pandemic)  ASSESSMENT & PLAN:    1. Paroxysmal atrial fibrillation-continue Toprol at present dose. 2. Hypertension-patient's blood pressure is controlled.  Continue present medications. 3. Hyperlipidemia-continue  statin. 4. Iliac aneurysms-we will plan follow-up ultrasound December 2020 pending outcome of pancreatic cancer. 5. Cardiomyopathy-patient appears to have hypertrophic cardiomyopathy on echocardiogram.  We were not able to perform a cardiac MRI because of claustrophobia.  We will continue beta-blocker. 6. Chronic stage III kidney disease-followed by primary care.  COVID-19 Education: The importance of social distancing was discussed today.  Time:   Today, I have spent *** minutes with the patient with telehealth technology discussing the above problems.     Medication Adjustments/Labs and Tests Ordered: Current medicines are reviewed at length with the  patient today.  Concerns regarding medicines are outlined above.   Tests Ordered: No orders of the defined types were placed in this encounter.   Medication Changes: No orders of the defined types were placed in this encounter.   Disposition:  Follow up {follow up:15908}  Signed, Kirk Ruths, MD  12/02/2018 12:49 PM    [facility]

## 2018-12-02 NOTE — Telephone Encounter (Signed)
Smartphone/ my chart via email/ consent/ pre reg completed °

## 2018-12-03 ENCOUNTER — Telehealth: Payer: Medicare Other | Admitting: Cardiology

## 2018-12-04 ENCOUNTER — Inpatient Hospital Stay: Payer: Medicare Other | Admitting: Oncology

## 2018-12-04 ENCOUNTER — Inpatient Hospital Stay: Payer: Medicare Other | Attending: Oncology

## 2018-12-04 ENCOUNTER — Inpatient Hospital Stay: Payer: Medicare Other

## 2018-12-04 DIAGNOSIS — E119 Type 2 diabetes mellitus without complications: Secondary | ICD-10-CM | POA: Insufficient documentation

## 2018-12-04 DIAGNOSIS — K831 Obstruction of bile duct: Secondary | ICD-10-CM | POA: Insufficient documentation

## 2018-12-04 DIAGNOSIS — C250 Malignant neoplasm of head of pancreas: Secondary | ICD-10-CM | POA: Insufficient documentation

## 2018-12-04 DIAGNOSIS — Z5111 Encounter for antineoplastic chemotherapy: Secondary | ICD-10-CM | POA: Insufficient documentation

## 2018-12-04 DIAGNOSIS — R634 Abnormal weight loss: Secondary | ICD-10-CM | POA: Insufficient documentation

## 2018-12-04 DIAGNOSIS — E860 Dehydration: Secondary | ICD-10-CM | POA: Insufficient documentation

## 2018-12-04 DIAGNOSIS — I4891 Unspecified atrial fibrillation: Secondary | ICD-10-CM | POA: Insufficient documentation

## 2018-12-04 DIAGNOSIS — R630 Anorexia: Secondary | ICD-10-CM | POA: Insufficient documentation

## 2018-12-04 DIAGNOSIS — N2889 Other specified disorders of kidney and ureter: Secondary | ICD-10-CM | POA: Insufficient documentation

## 2018-12-04 DIAGNOSIS — J449 Chronic obstructive pulmonary disease, unspecified: Secondary | ICD-10-CM | POA: Insufficient documentation

## 2018-12-04 DIAGNOSIS — R197 Diarrhea, unspecified: Secondary | ICD-10-CM | POA: Insufficient documentation

## 2018-12-04 DIAGNOSIS — I429 Cardiomyopathy, unspecified: Secondary | ICD-10-CM | POA: Insufficient documentation

## 2018-12-04 DIAGNOSIS — N19 Unspecified kidney failure: Secondary | ICD-10-CM | POA: Insufficient documentation

## 2018-12-04 DIAGNOSIS — I10 Essential (primary) hypertension: Secondary | ICD-10-CM | POA: Insufficient documentation

## 2018-12-04 DIAGNOSIS — Z79899 Other long term (current) drug therapy: Secondary | ICD-10-CM | POA: Insufficient documentation

## 2018-12-04 DIAGNOSIS — Z8719 Personal history of other diseases of the digestive system: Secondary | ICD-10-CM | POA: Insufficient documentation

## 2018-12-09 ENCOUNTER — Telehealth: Payer: Self-pay | Admitting: Oncology

## 2018-12-09 NOTE — Telephone Encounter (Signed)
R/s appt from 5/8 - pt aware of new appt date and time on 5/15

## 2018-12-11 ENCOUNTER — Inpatient Hospital Stay: Payer: Medicare Other

## 2018-12-11 ENCOUNTER — Inpatient Hospital Stay (HOSPITAL_BASED_OUTPATIENT_CLINIC_OR_DEPARTMENT_OTHER): Payer: Medicare Other | Admitting: Oncology

## 2018-12-11 ENCOUNTER — Other Ambulatory Visit: Payer: Self-pay

## 2018-12-11 ENCOUNTER — Telehealth: Payer: Self-pay | Admitting: Oncology

## 2018-12-11 VITALS — BP 106/59 | HR 57 | Temp 98.5°F | Resp 18 | Ht 70.0 in | Wt 135.5 lb

## 2018-12-11 DIAGNOSIS — E860 Dehydration: Secondary | ICD-10-CM | POA: Diagnosis not present

## 2018-12-11 DIAGNOSIS — R634 Abnormal weight loss: Secondary | ICD-10-CM

## 2018-12-11 DIAGNOSIS — K831 Obstruction of bile duct: Secondary | ICD-10-CM | POA: Diagnosis not present

## 2018-12-11 DIAGNOSIS — R197 Diarrhea, unspecified: Secondary | ICD-10-CM

## 2018-12-11 DIAGNOSIS — N2889 Other specified disorders of kidney and ureter: Secondary | ICD-10-CM | POA: Diagnosis not present

## 2018-12-11 DIAGNOSIS — C250 Malignant neoplasm of head of pancreas: Secondary | ICD-10-CM

## 2018-12-11 DIAGNOSIS — Z5111 Encounter for antineoplastic chemotherapy: Secondary | ICD-10-CM

## 2018-12-11 DIAGNOSIS — I429 Cardiomyopathy, unspecified: Secondary | ICD-10-CM

## 2018-12-11 DIAGNOSIS — R630 Anorexia: Secondary | ICD-10-CM

## 2018-12-11 DIAGNOSIS — I10 Essential (primary) hypertension: Secondary | ICD-10-CM | POA: Diagnosis not present

## 2018-12-11 DIAGNOSIS — Z79899 Other long term (current) drug therapy: Secondary | ICD-10-CM | POA: Diagnosis not present

## 2018-12-11 DIAGNOSIS — E119 Type 2 diabetes mellitus without complications: Secondary | ICD-10-CM

## 2018-12-11 DIAGNOSIS — Z95828 Presence of other vascular implants and grafts: Secondary | ICD-10-CM

## 2018-12-11 DIAGNOSIS — Z8719 Personal history of other diseases of the digestive system: Secondary | ICD-10-CM | POA: Diagnosis not present

## 2018-12-11 DIAGNOSIS — N19 Unspecified kidney failure: Secondary | ICD-10-CM | POA: Diagnosis not present

## 2018-12-11 DIAGNOSIS — I4891 Unspecified atrial fibrillation: Secondary | ICD-10-CM | POA: Diagnosis not present

## 2018-12-11 DIAGNOSIS — J449 Chronic obstructive pulmonary disease, unspecified: Secondary | ICD-10-CM

## 2018-12-11 LAB — CMP (CANCER CENTER ONLY)
ALT: 9 U/L (ref 0–44)
AST: 13 U/L — ABNORMAL LOW (ref 15–41)
Albumin: 3.3 g/dL — ABNORMAL LOW (ref 3.5–5.0)
Alkaline Phosphatase: 96 U/L (ref 38–126)
Anion gap: 8 (ref 5–15)
BUN: 16 mg/dL (ref 8–23)
CO2: 16 mmol/L — ABNORMAL LOW (ref 22–32)
Calcium: 9.6 mg/dL (ref 8.9–10.3)
Chloride: 115 mmol/L — ABNORMAL HIGH (ref 98–111)
Creatinine: 2.07 mg/dL — ABNORMAL HIGH (ref 0.61–1.24)
GFR, Est AFR Am: 37 mL/min — ABNORMAL LOW (ref >60)
GFR, Estimated: 32 mL/min — ABNORMAL LOW (ref >60)
Glucose, Bld: 133 mg/dL — ABNORMAL HIGH (ref 70–99)
Potassium: 3.1 mmol/L — ABNORMAL LOW (ref 3.5–5.1)
Sodium: 139 mmol/L (ref 135–145)
Total Bilirubin: 0.6 mg/dL (ref 0.3–1.2)
Total Protein: 7.6 g/dL (ref 6.5–8.1)

## 2018-12-11 LAB — CBC WITH DIFFERENTIAL (CANCER CENTER ONLY)
Abs Immature Granulocytes: 0.05 10*3/uL (ref 0.00–0.07)
Basophils Absolute: 0.1 10*3/uL (ref 0.0–0.1)
Basophils Relative: 1 %
Eosinophils Absolute: 0.2 10*3/uL (ref 0.0–0.5)
Eosinophils Relative: 2 %
HCT: 32 % — ABNORMAL LOW (ref 39.0–52.0)
Hemoglobin: 10.4 g/dL — ABNORMAL LOW (ref 13.0–17.0)
Immature Granulocytes: 0 %
Lymphocytes Relative: 18 %
Lymphs Abs: 2.1 10*3/uL (ref 0.7–4.0)
MCH: 31.5 pg (ref 26.0–34.0)
MCHC: 32.5 g/dL (ref 30.0–36.0)
MCV: 97 fL (ref 80.0–100.0)
Monocytes Absolute: 1 10*3/uL (ref 0.1–1.0)
Monocytes Relative: 9 %
Neutro Abs: 8 10*3/uL — ABNORMAL HIGH (ref 1.7–7.7)
Neutrophils Relative %: 70 %
Platelet Count: 474 10*3/uL — ABNORMAL HIGH (ref 150–400)
RBC: 3.3 MIL/uL — ABNORMAL LOW (ref 4.22–5.81)
RDW: 15.3 % (ref 11.5–15.5)
WBC Count: 11.4 10*3/uL — ABNORMAL HIGH (ref 4.0–10.5)
nRBC: 0 % (ref 0.0–0.2)

## 2018-12-11 MED ORDER — POTASSIUM CHLORIDE CRYS ER 20 MEQ PO TBCR: 20.0000 meq | EXTENDED_RELEASE_TABLET | Freq: Every day | ORAL | 1 refills | Status: AC

## 2018-12-11 MED ORDER — SODIUM CHLORIDE 0.9 % IV SOLN: 1824.0000 mg | Freq: Once | INTRAVENOUS | Status: DC

## 2018-12-11 MED ORDER — SODIUM CHLORIDE 0.9 % IV SOLN
Freq: Once | INTRAVENOUS | Status: AC
  Administered 2018-12-11: 10:00:00 via INTRAVENOUS
  Filled 2018-12-11: qty 250

## 2018-12-11 MED ORDER — PROCHLORPERAZINE MALEATE 10 MG PO TABS: 10.0000 mg | ORAL_TABLET | Freq: Once | ORAL | Status: DC

## 2018-12-11 MED ORDER — SODIUM CHLORIDE 0.9 % IV SOLN
Freq: Once | INTRAVENOUS | Status: DC
  Filled 2018-12-11: qty 250

## 2018-12-11 MED ORDER — HEPARIN SOD (PORK) LOCK FLUSH 100 UNIT/ML IV SOLN
500.0000 [IU] | Freq: Once | INTRAVENOUS | Status: AC | PRN
  Administered 2018-12-11: 500 [IU]
  Filled 2018-12-11: qty 5

## 2018-12-11 MED ORDER — PACLITAXEL PROTEIN-BOUND CHEMO INJECTION 100 MG
100.0000 mg/m2 | Freq: Once | INTRAVENOUS | Status: AC
  Administered 2018-12-11: 12:00:00 175 mg via INTRAVENOUS
  Filled 2018-12-11: qty 35

## 2018-12-11 MED ORDER — POTASSIUM CHLORIDE CRYS ER 20 MEQ PO TBCR
EXTENDED_RELEASE_TABLET | ORAL | Status: AC
  Filled 2018-12-11: qty 2

## 2018-12-11 MED ORDER — PROCHLORPERAZINE MALEATE 10 MG PO TABS
ORAL_TABLET | ORAL | Status: AC
  Filled 2018-12-11: qty 1

## 2018-12-11 MED ORDER — POTASSIUM CHLORIDE CRYS ER 20 MEQ PO TBCR
40.0000 meq | EXTENDED_RELEASE_TABLET | Freq: Once | ORAL | Status: AC
  Administered 2018-12-11: 40 meq via ORAL

## 2018-12-11 MED ORDER — PANCRELIPASE (LIP-PROT-AMYL) 36000-114000 UNITS PO CPEP: 36000.0000 [IU] | ORAL_CAPSULE | Freq: Three times a day (TID) | ORAL | 1 refills | Status: AC

## 2018-12-11 MED ORDER — SODIUM CHLORIDE 0.9% FLUSH
10.0000 mL | INTRAVENOUS | Status: DC | PRN
  Administered 2018-12-11: 10 mL
  Filled 2018-12-11: qty 10

## 2018-12-11 MED ORDER — SODIUM CHLORIDE 0.9 % IV SOLN
1000.0000 mg/m2 | Freq: Once | INTRAVENOUS | Status: AC
  Administered 2018-12-11: 1748 mg via INTRAVENOUS
  Filled 2018-12-11: qty 45.97

## 2018-12-11 NOTE — Addendum Note (Signed)
Addended by: Betsy Coder B on: 12/11/2018 10:53 AM   Modules accepted: Orders

## 2018-12-11 NOTE — Progress Notes (Signed)
  [name] [name] OFFICE PROGRESS NOTE   Diagnosis: Pancreas cancer  INTERVAL HISTORY:   [name] [name] missed an office visit and chemotherapy last week.  He has developed diarrhea.  He reports 2-3 loose stools per day.  He says the stools and sometimes float.  Imodium helps.  He has an adequate appetite.  No change in baseline neuropathy symptoms.  Objective:  Vital signs in last 24 hours:  Blood pressure (!) 106/59, pulse (!) 57, temperature 98.5 F (36.9 C), temperature source Oral, resp. rate 18, height 5\' 10"  (1.778 m), weight 135 lb 8 oz (61.5 kg), SpO2 100 %.    HEENT: No thrush or ulcers Resp: Lungs clear bilaterally Cardio: Regular rate and rhythm GI: No hepatomegaly, nontender Vascular: No leg edema   Portacath/PICC-without erythema  Lab Results:  Lab Results  Component Value Date   WBC 11.4 (H) 12/11/2018   HGB 10.4 (L) 12/11/2018   HCT 32.0 (L) 12/11/2018   MCV 97.0 12/11/2018   PLT 474 (H) 12/11/2018   NEUTROABS 8.0 (H) 12/11/2018    CMP  Lab Results  Component Value Date   NA 139 12/11/2018   K 3.1 (L) 12/11/2018   CL 115 (H) 12/11/2018   CO2 16 (L) 12/11/2018   GLUCOSE 133 (H) 12/11/2018   BUN 16 12/11/2018   CREATININE 2.07 (H) 12/11/2018   CALCIUM 9.6 12/11/2018   PROT 7.6 12/11/2018   ALBUMIN 3.3 (L) 12/11/2018   AST 13 (L) 12/11/2018   ALT 9 12/11/2018   ALKPHOS 96 12/11/2018   BILITOT 0.6 12/11/2018   GFRNONAA 32 (L) 12/11/2018   GFRAA 37 (L) 12/11/2018     Medications: I have reviewed the patient's current medications.   Assessment/Plan: 1. Pancreas cancer clinical stage III (cT4,cN0), adenocarcinoma on FNA biopsy of pancreas mass 10/08/2018 ? Presenting with obstructive jaundice December 2019 ? MRI abdomen 07/28/2018- bile duct obstruction secondary to pancreas head mass ? ERCP/bile duct stent 07/30/2018 ? Repeat ERCP with placement of covered stent 08/13/2018 ? CT on 09/10/2018-pancreas mass associated with superior  mesenteric artery-partially encased, increased soft tissue adjacent to the celiac axis ? EUS biopsy 10/08/2018- adenocarcinoma ? Cycle 1 gemcitabine/Abraxane 11/06/2018 ? Cycle 2 gemcitabine/Abraxane 11/20/2018 ? Cycle 3 gemcitabine/Abraxane 12/11/2018 2.  Diabetes 3.  Renal insufficiency 4.  COPD 5.  History of diverticulitis 6.  Cardiomyopathy 7.  History of atrial fibrillation 8.  History of hypertension 9.  Anorexia/weight loss secondary to #1 10. Obstructive jaundice secondary to #1, status post placement of a bile duct stent 11.  Port-A-Cath placement interventional radiology 11/04/2018 12.  Diarrhea-likely secondary to pancreas insufficiency, he will begin pancreatic enzyme replacement    Disposition: [name] [name] has completed 2 treatments with gemcitabine/Abraxane.  His performance status has not improved.  He is losing weight.  He now has diarrhea.  The diarrhea may be related to pancreatic insufficiency.  He will continue Imodium and begin a trial of pancreas enzyme replacement.  The potassium will be repleted. We will dose adjust the chemotherapy based on his current weight.  He will receive extra intravenous fluids today. [name] [name] will return for an office visit in 1 week.  We will consider discontinuing chemotherapy if his clinical status does not stabilize over the next few weeks.  Betsy Coder, MD  12/11/2018  10:07 AM

## 2018-12-11 NOTE — Addendum Note (Signed)
Addended by: Tania Ade on: 12/11/2018 10:55 AM   Modules accepted: Orders

## 2018-12-11 NOTE — Progress Notes (Signed)
MD reviewed labs today: OK to treat with K+ 3.1 (will give 40 meq po in infusion today and call in script) and creatinine 2.0. MD will dose reduce today based on his weight loss. Also will administer 500 cc NS today during infusion.

## 2018-12-11 NOTE — Patient Instructions (Signed)
[place] Discharge Instructions for Patients Receiving Chemotherapy  Today you received the following chemotherapy agents Gemzar and Abraxane.  To help prevent nausea and vomiting after your treatment, we encourage you to take your nausea medication as directed.   If you develop nausea and vomiting that is not controlled by your nausea medication, call the clinic.   BELOW ARE SYMPTOMS THAT SHOULD BE REPORTED IMMEDIATELY:  *FEVER GREATER THAN 100.5 F  *CHILLS WITH OR WITHOUT FEVER  NAUSEA AND VOMITING THAT IS NOT CONTROLLED WITH YOUR NAUSEA MEDICATION  *UNUSUAL SHORTNESS OF BREATH  *UNUSUAL BRUISING OR BLEEDING  TENDERNESS IN MOUTH AND THROAT WITH OR WITHOUT PRESENCE OF ULCERS  *URINARY PROBLEMS  *BOWEL PROBLEMS  UNUSUAL RASH Items with * indicate a potential emergency and should be followed up as soon as possible.  Feel free to call the clinic you have any questions or concerns. The clinic phone number is [phone].  Please show the [place] at check-in to the Emergency Department and triage nurse.  Coronavirus (COVID-19) Are you at risk?  Are you at risk for the Coronavirus (COVID-19)?  To be considered HIGH RISK for Coronavirus (COVID-19), you have to meet the following criteria:  . Traveled to [place], [place], [place], [place] or [place]; or in the [place] to [place], [place], [place], or [place]; and have fever, cough, and shortness of breath within the last 2 weeks of travel OR . Been in close contact with a person diagnosed with COVID-19 within the last 2 weeks and have fever, cough, and shortness of breath . IF YOU DO NOT MEET THESE CRITERIA, YOU ARE CONSIDERED LOW RISK FOR COVID-19.  What to do if you are HIGH RISK for COVID-19?  Marland Kitchen If you are having a medical emergency, call 911. . Seek medical care right away. Before you go to a doctor's office, urgent care or emergency department, call ahead and tell them about your  recent travel, contact with someone diagnosed with COVID-19, and your symptoms. You should receive instructions from your physician's office regarding next steps of care.  . When you arrive at healthcare provider, tell the healthcare staff immediately you have returned from visiting [place], [place], [place], [place] or [place]; or traveled in the [place] to [place], [place], [place], or [place]; in the last two weeks or you have been in close contact with a person diagnosed with COVID-19 in the last 2 weeks.   . Tell the health care staff about your symptoms: fever, cough and shortness of breath. . After you have been seen by a medical provider, you will be either: o Tested for (COVID-19) and discharged home on quarantine except to seek medical care if symptoms worsen, and asked to  - Stay home and avoid contact with others until you get your results (4-5 days)  - Avoid travel on public transportation if possible (such as bus, train, or airplane) or o Sent to the Emergency Department by EMS for evaluation, COVID-19 testing, and possible admission depending on your condition and test results.  What to do if you are LOW RISK for COVID-19?  Reduce your risk of any infection by using the same precautions used for avoiding the common cold or flu:  Marland Kitchen Wash your hands often with soap and warm water for at least 20 seconds.  If soap and water are not readily available, use an alcohol-based hand sanitizer with at least 60% alcohol.  . If coughing or  sneezing, cover your mouth and nose by coughing or sneezing into the elbow areas of your shirt or coat, into a tissue or into your sleeve (not your hands). . Avoid shaking hands with others and consider head nods or verbal greetings only. . Avoid touching your eyes, nose, or mouth with unwashed hands.  . Avoid close contact with people who are sick. . Avoid places or events with large numbers of people in one location, like concerts or sporting  events. . Carefully consider travel plans you have or are making. . If you are planning any travel outside or inside the [place], visit the CDC's Travelers' Health webpage for the latest health notices. . If you have some symptoms but not all symptoms, continue to monitor at home and seek medical attention if your symptoms worsen. . If you are having a medical emergency, call 911.   [place] / e-Visit: [email or web address]         [facility]: [facility]: [phone]                   [facility]: [phone]

## 2018-12-11 NOTE — Telephone Encounter (Signed)
Scheduled appt per 5/15 los.  Phone was busy when I called, not able to leave a VM

## 2018-12-12 LAB — CANCER ANTIGEN 19-9: CA 19-9: 482 U/mL — ABNORMAL HIGH (ref 0–35)

## 2018-12-17 ENCOUNTER — Telehealth: Payer: Self-pay | Admitting: *Deleted

## 2018-12-17 NOTE — Telephone Encounter (Signed)
[name] [name] denies problems.  "I feel okay, just a little tired.  Eating but my appetite comes and goes.  Cooked and ate good [place] wings yesterday.  Two loose, diarrhea stools today.  Using Imodium.  I have cases of water and Gatorade I drinking.  I do know I have appointment tomorrow; am I to be there at 8:00 am?"           Denies illness, symptoms or changes needing he[pp today.  Offered   Denies any symptoms, problems Provided appointment information.  Denies transportation needs.   Daughter [name] [phone]) of [name] called "inquiring of [facility] patient services.  1. My father's apartment manager called me saying he checked on dad yesterday (12-16-2018).  Dad fell or passed out when he opened his door.  Manager stayed with Dad until he was able to stand up again.  Said he hadn't eaten since Tuesday.   Dad does not know the manager called me.   2. Dad placates everything to look like all is well.   3. Lives alone.  No progress yet but we have talked about him moving here.  Says Dr. Benay Spice is working on a transfer to [place].  Gave me the date of his birthday (02-09-2021) as the day he'll move to [place]. 4. He's not well physically.  Weakness, no stamina and low energy.  Like he has the flu.   5. Not eating.  I send food for him.  He just is not eating.    6. Doesn't talk much these days except today he had more conversation.   7. Has diarrhea but only using Imodium.  He said the prescription medication would cost $400.00. 8. I need to find out how to get more help.  Also needs Medicaid. 9. He's there every two weeks for treatment but doesn't know when he is to return." Expressed [facility] is an ambulatory provider office providing Hem/Onc treatments, infusions and radiation services.  Office does not provide Home Health services.

## 2018-12-18 ENCOUNTER — Ambulatory Visit: Payer: Medicare Other

## 2018-12-18 ENCOUNTER — Inpatient Hospital Stay: Payer: Medicare Other

## 2018-12-18 ENCOUNTER — Inpatient Hospital Stay: Payer: Medicare Other | Admitting: Nurse Practitioner

## 2018-12-21 ENCOUNTER — Other Ambulatory Visit: Payer: Self-pay | Admitting: Oncology

## 2018-12-22 ENCOUNTER — Telehealth: Payer: Self-pay

## 2018-12-22 ENCOUNTER — Encounter: Payer: Self-pay | Admitting: General Practice

## 2018-12-22 DIAGNOSIS — C250 Malignant neoplasm of head of pancreas: Secondary | ICD-10-CM

## 2018-12-22 NOTE — Telephone Encounter (Signed)
Patient gave verbal consent to speak directly with his daughter, Leslye Peer and his son  Dr. Roselind Rily.

## 2018-12-22 NOTE — Telephone Encounter (Signed)
Pt's daughter, Leslye Peer  called to say that she is concerned about her father living alone. She reports that he is weak and unable to get down stairs and out the door for transportation to his appointments. She is calling asking for resources to find someone to stay with patient during the day and to see that he gets to his appointments. Patient does not want to move to [place] to live near her.  Referral placed to social work.

## 2018-12-22 NOTE — Progress Notes (Signed)
[facility] CSW Progress Notes  Nurse navigator [employer] requested that we contact patient's son, Dr. Joneen Caraway [phone]) or daughter Leslye Peer [phone]).   Nurse navigator has spoken w patient who has consented to contact w his son and daughter.  Worried that patient has fallen at home, is missing appointments, may not be eating well despite having food in the home, "likely has no energy to prepare food at all."  Daughter lives 1.5 hours away, would like him to move closer but "he is just not ready yet."  From latest visit by daughter, she is concerned that he had not eaten in 3 days, despite presence of food in the home.  Is drinking Boost, but not eating.  Significantly fatigued.  Leaves prepared foods in fridge, little interest in eating.  Daughter concerned about his deconditioned state due to lack of nutrition.  Daughter says she is healthcare POA, is not financial POA.  Wants to start [place] application on patients behalf, discussed how best to assist him w this.  Wants to get information on patients plan of care and appointments - CSW asked nurse navigator to address this when patient is at [facility] for treatment on Friday. Daughter in [place], son lives in [place].  Both are concerned about father and realistic about his need to make his own decisions about where he lives and what treatment he pursues.  Would like to have better understanding of plan of care and upcoming appointments.  They are working on how to get him transported to [facility] (previously he was driving himself).  Can be referred for [facility] transport if he is able to ambulate, can be referred to [facility] if he needs wheelchair transport.  For Fridays appointment, family hopes to have him transported with help of family friend w daughter planning to pick him up.  Family investigating options for in home care, aware that these arrangements are usually private pay unless patient qualifies for Medicaid.  Daughter wondering about whether she  can relocate patient nearer her home for period of convalescence - she would either transfer his care to [place] area or continue to bring patient to [facility] appointments as needed.  MD and nurse navigator updated, asked to find way to have patient consent to increased contact w family and for family to have increased understanding of his care needs/appointments.  Edwyna Shell, LCSW Clinical Social Worker Phone:  [phone]

## 2018-12-24 ENCOUNTER — Telehealth: Payer: Self-pay

## 2018-12-24 NOTE — Telephone Encounter (Signed)
Called and spoke with patient's daughter to confirm apt with Dr. Benay Spice on 5/29. She states that she will have someone get him there. She would like to join the visit by phone.

## 2018-12-25 ENCOUNTER — Encounter: Payer: Self-pay | Admitting: Nurse Practitioner

## 2018-12-25 ENCOUNTER — Inpatient Hospital Stay: Payer: Medicare Other

## 2018-12-25 ENCOUNTER — Inpatient Hospital Stay (HOSPITAL_BASED_OUTPATIENT_CLINIC_OR_DEPARTMENT_OTHER): Payer: Medicare Other | Admitting: Nurse Practitioner

## 2018-12-25 ENCOUNTER — Inpatient Hospital Stay (HOSPITAL_COMMUNITY)
Admission: AD | Admit: 2018-12-25 | Discharge: 2018-12-29 | DRG: 683 | Disposition: A | Discharge status: Home / Self Care | Payer: Medicare Other | Source: Ambulatory Visit | Attending: Internal Medicine | Admitting: Internal Medicine

## 2018-12-25 ENCOUNTER — Encounter (HOSPITAL_COMMUNITY): Payer: Self-pay | Admitting: Internal Medicine

## 2018-12-25 ENCOUNTER — Other Ambulatory Visit: Payer: Self-pay

## 2018-12-25 VITALS — BP 106/69 | HR 70 | Temp 98.0°F | Resp 18 | Ht 70.0 in | Wt 129.3 lb

## 2018-12-25 DIAGNOSIS — K219 Gastro-esophageal reflux disease without esophagitis: Secondary | ICD-10-CM | POA: Diagnosis present

## 2018-12-25 DIAGNOSIS — R64 Cachexia: Secondary | ICD-10-CM | POA: Diagnosis present

## 2018-12-25 DIAGNOSIS — Z1159 Encounter for screening for other viral diseases: Secondary | ICD-10-CM | POA: Diagnosis not present

## 2018-12-25 DIAGNOSIS — N183 Chronic kidney disease, stage 3 unspecified: Secondary | ICD-10-CM | POA: Diagnosis present

## 2018-12-25 DIAGNOSIS — R197 Diarrhea, unspecified: Secondary | ICD-10-CM

## 2018-12-25 DIAGNOSIS — Z79899 Other long term (current) drug therapy: Secondary | ICD-10-CM | POA: Diagnosis not present

## 2018-12-25 DIAGNOSIS — Z7902 Long term (current) use of antithrombotics/antiplatelets: Secondary | ICD-10-CM

## 2018-12-25 DIAGNOSIS — Z888 Allergy status to other drugs, medicaments and biological substances status: Secondary | ICD-10-CM

## 2018-12-25 DIAGNOSIS — I480 Paroxysmal atrial fibrillation: Secondary | ICD-10-CM | POA: Diagnosis present

## 2018-12-25 DIAGNOSIS — J42 Unspecified chronic bronchitis: Secondary | ICD-10-CM | POA: Diagnosis not present

## 2018-12-25 DIAGNOSIS — Z7901 Long term (current) use of anticoagulants: Secondary | ICD-10-CM

## 2018-12-25 DIAGNOSIS — E876 Hypokalemia: Secondary | ICD-10-CM | POA: Diagnosis present

## 2018-12-25 DIAGNOSIS — Z833 Family history of diabetes mellitus: Secondary | ICD-10-CM

## 2018-12-25 DIAGNOSIS — I4891 Unspecified atrial fibrillation: Secondary | ICD-10-CM

## 2018-12-25 DIAGNOSIS — Z9221 Personal history of antineoplastic chemotherapy: Secondary | ICD-10-CM

## 2018-12-25 DIAGNOSIS — N281 Cyst of kidney, acquired: Secondary | ICD-10-CM | POA: Diagnosis not present

## 2018-12-25 DIAGNOSIS — E1122 Type 2 diabetes mellitus with diabetic chronic kidney disease: Secondary | ICD-10-CM | POA: Diagnosis present

## 2018-12-25 DIAGNOSIS — N19 Unspecified kidney failure: Secondary | ICD-10-CM

## 2018-12-25 DIAGNOSIS — J449 Chronic obstructive pulmonary disease, unspecified: Secondary | ICD-10-CM

## 2018-12-25 DIAGNOSIS — R001 Bradycardia, unspecified: Secondary | ICD-10-CM | POA: Diagnosis present

## 2018-12-25 DIAGNOSIS — I10 Essential (primary) hypertension: Secondary | ICD-10-CM | POA: Diagnosis present

## 2018-12-25 DIAGNOSIS — Z960 Presence of urogenital implants: Secondary | ICD-10-CM | POA: Diagnosis present

## 2018-12-25 DIAGNOSIS — T451X5A Adverse effect of antineoplastic and immunosuppressive drugs, initial encounter: Secondary | ICD-10-CM | POA: Diagnosis not present

## 2018-12-25 DIAGNOSIS — Z800 Family history of malignant neoplasm of digestive organs: Secondary | ICD-10-CM

## 2018-12-25 DIAGNOSIS — Z8719 Personal history of other diseases of the digestive system: Secondary | ICD-10-CM

## 2018-12-25 DIAGNOSIS — E1140 Type 2 diabetes mellitus with diabetic neuropathy, unspecified: Secondary | ICD-10-CM | POA: Diagnosis present

## 2018-12-25 DIAGNOSIS — R630 Anorexia: Secondary | ICD-10-CM | POA: Diagnosis present

## 2018-12-25 DIAGNOSIS — C250 Malignant neoplasm of head of pancreas: Secondary | ICD-10-CM

## 2018-12-25 DIAGNOSIS — I429 Cardiomyopathy, unspecified: Secondary | ICD-10-CM | POA: Diagnosis not present

## 2018-12-25 DIAGNOSIS — Z86010 Personal history of colonic polyps: Secondary | ICD-10-CM

## 2018-12-25 DIAGNOSIS — E1121 Type 2 diabetes mellitus with diabetic nephropathy: Secondary | ICD-10-CM

## 2018-12-25 DIAGNOSIS — E860 Dehydration: Secondary | ICD-10-CM | POA: Diagnosis present

## 2018-12-25 DIAGNOSIS — I129 Hypertensive chronic kidney disease with stage 1 through stage 4 chronic kidney disease, or unspecified chronic kidney disease: Secondary | ICD-10-CM | POA: Diagnosis present

## 2018-12-25 DIAGNOSIS — K831 Obstruction of bile duct: Secondary | ICD-10-CM

## 2018-12-25 DIAGNOSIS — I428 Other cardiomyopathies: Secondary | ICD-10-CM | POA: Diagnosis present

## 2018-12-25 DIAGNOSIS — Z794 Long term (current) use of insulin: Secondary | ICD-10-CM | POA: Diagnosis not present

## 2018-12-25 DIAGNOSIS — C259 Malignant neoplasm of pancreas, unspecified: Secondary | ICD-10-CM | POA: Diagnosis present

## 2018-12-25 DIAGNOSIS — Z720 Tobacco use: Secondary | ICD-10-CM | POA: Diagnosis not present

## 2018-12-25 DIAGNOSIS — E785 Hyperlipidemia, unspecified: Secondary | ICD-10-CM | POA: Diagnosis present

## 2018-12-25 DIAGNOSIS — R627 Adult failure to thrive: Secondary | ICD-10-CM | POA: Diagnosis present

## 2018-12-25 DIAGNOSIS — Z8249 Family history of ischemic heart disease and other diseases of the circulatory system: Secondary | ICD-10-CM

## 2018-12-25 DIAGNOSIS — E872 Acidosis, unspecified: Secondary | ICD-10-CM | POA: Diagnosis present

## 2018-12-25 DIAGNOSIS — K8681 Exocrine pancreatic insufficiency: Secondary | ICD-10-CM | POA: Diagnosis present

## 2018-12-25 DIAGNOSIS — Z87891 Personal history of nicotine dependence: Secondary | ICD-10-CM

## 2018-12-25 DIAGNOSIS — Z95828 Presence of other vascular implants and grafts: Secondary | ICD-10-CM

## 2018-12-25 DIAGNOSIS — R634 Abnormal weight loss: Secondary | ICD-10-CM

## 2018-12-25 DIAGNOSIS — Z880 Allergy status to penicillin: Secondary | ICD-10-CM

## 2018-12-25 DIAGNOSIS — E119 Type 2 diabetes mellitus without complications: Secondary | ICD-10-CM | POA: Diagnosis not present

## 2018-12-25 DIAGNOSIS — N2889 Other specified disorders of kidney and ureter: Secondary | ICD-10-CM | POA: Diagnosis not present

## 2018-12-25 DIAGNOSIS — N179 Acute kidney failure, unspecified: Secondary | ICD-10-CM | POA: Diagnosis present

## 2018-12-25 DIAGNOSIS — D6481 Anemia due to antineoplastic chemotherapy: Secondary | ICD-10-CM | POA: Diagnosis present

## 2018-12-25 DIAGNOSIS — D638 Anemia in other chronic diseases classified elsewhere: Secondary | ICD-10-CM | POA: Diagnosis present

## 2018-12-25 DIAGNOSIS — Z8042 Family history of malignant neoplasm of prostate: Secondary | ICD-10-CM

## 2018-12-25 LAB — CBC WITH DIFFERENTIAL (CANCER CENTER ONLY)
Abs Immature Granulocytes: 0.11 10*3/uL — ABNORMAL HIGH (ref 0.00–0.07)
Basophils Absolute: 0.1 10*3/uL (ref 0.0–0.1)
Basophils Relative: 1 %
Eosinophils Absolute: 0.1 10*3/uL (ref 0.0–0.5)
Eosinophils Relative: 2 %
HCT: 29.2 % — ABNORMAL LOW (ref 39.0–52.0)
Hemoglobin: 9.5 g/dL — ABNORMAL LOW (ref 13.0–17.0)
Immature Granulocytes: 1 %
Lymphocytes Relative: 21 %
Lymphs Abs: 1.9 10*3/uL (ref 0.7–4.0)
MCH: 31.9 pg (ref 26.0–34.0)
MCHC: 32.5 g/dL (ref 30.0–36.0)
MCV: 98 fL (ref 80.0–100.0)
Monocytes Absolute: 0.7 10*3/uL (ref 0.1–1.0)
Monocytes Relative: 8 %
Neutro Abs: 6.1 10*3/uL (ref 1.7–7.7)
Neutrophils Relative %: 67 %
Platelet Count: 344 10*3/uL (ref 150–400)
RBC: 2.98 MIL/uL — ABNORMAL LOW (ref 4.22–5.81)
RDW: 16.2 % — ABNORMAL HIGH (ref 11.5–15.5)
WBC Count: 8.9 10*3/uL (ref 4.0–10.5)
nRBC: 1.7 % — ABNORMAL HIGH (ref 0.0–0.2)

## 2018-12-25 LAB — BASIC METABOLIC PANEL - CANCER CENTER ONLY
Anion gap: 10 (ref 5–15)
BUN: 39 mg/dL — ABNORMAL HIGH (ref 8–23)
CO2: 12 mmol/L — ABNORMAL LOW (ref 22–32)
Calcium: 9.3 mg/dL (ref 8.9–10.3)
Chloride: 117 mmol/L — ABNORMAL HIGH (ref 98–111)
Creatinine: 4.42 mg/dL (ref 0.61–1.24)
GFR, Est AFR Am: 15 mL/min — ABNORMAL LOW (ref >60)
GFR, Estimated: 13 mL/min — ABNORMAL LOW (ref >60)
Glucose, Bld: 119 mg/dL — ABNORMAL HIGH (ref 70–99)
Potassium: 3.2 mmol/L — ABNORMAL LOW (ref 3.5–5.1)
Sodium: 139 mmol/L (ref 135–145)

## 2018-12-25 LAB — TYPE AND SCREEN
ABO/RH(D): A POS
Antibody Screen: NEGATIVE

## 2018-12-25 LAB — GLUCOSE, CAPILLARY: Glucose-Capillary: 95 mg/dL (ref 70–99)

## 2018-12-25 LAB — MAGNESIUM: Magnesium: 1.7 mg/dL (ref 1.7–2.4)

## 2018-12-25 LAB — SARS CORONAVIRUS 2 BY RT PCR (HOSPITAL ORDER, PERFORMED IN ~~LOC~~ HOSPITAL LAB): SARS Coronavirus 2: NEGATIVE

## 2018-12-25 MED ORDER — SODIUM BICARBONATE 8.4 % IV SOLN
INTRAVENOUS | Status: DC
  Administered 2018-12-25 – 2018-12-29 (×8): via INTRAVENOUS
  Filled 2018-12-25 (×9): qty 150

## 2018-12-25 MED ORDER — FOLIC ACID 1 MG PO TABS
1.0000 mg | ORAL_TABLET | Freq: Every day | ORAL | Status: DC
  Administered 2018-12-25 – 2018-12-29 (×5): 1 mg via ORAL
  Filled 2018-12-25 (×5): qty 1

## 2018-12-25 MED ORDER — ALBUTEROL SULFATE HFA 108 (90 BASE) MCG/ACT IN AERS: 2.0000 | INHALATION_SPRAY | Freq: Four times a day (QID) | RESPIRATORY_TRACT | Status: DC | PRN

## 2018-12-25 MED ORDER — POTASSIUM CHLORIDE CRYS ER 20 MEQ PO TBCR
EXTENDED_RELEASE_TABLET | ORAL | Status: AC
  Filled 2018-12-25: qty 1

## 2018-12-25 MED ORDER — METOPROLOL SUCCINATE ER 50 MG PO TB24
50.0000 mg | ORAL_TABLET | Freq: Every day | ORAL | Status: DC
  Administered 2018-12-26: 50 mg via ORAL
  Filled 2018-12-25: qty 1

## 2018-12-25 MED ORDER — ACETAMINOPHEN 500 MG PO TABS: 1000.0000 mg | ORAL_TABLET | Freq: Four times a day (QID) | ORAL | Status: DC | PRN

## 2018-12-25 MED ORDER — POTASSIUM CHLORIDE CRYS ER 20 MEQ PO TBCR
20.0000 meq | EXTENDED_RELEASE_TABLET | Freq: Once | ORAL | Status: AC
  Administered 2018-12-25: 16:00:00 20 meq via ORAL

## 2018-12-25 MED ORDER — ACETAMINOPHEN 650 MG RE SUPP: 650.0000 mg | Freq: Four times a day (QID) | RECTAL | Status: DC | PRN

## 2018-12-25 MED ORDER — OMEGA-3 FATTY ACIDS 1000 MG PO CAPS: 2.0000 g | ORAL_CAPSULE | Freq: Every day | ORAL | Status: DC

## 2018-12-25 MED ORDER — POTASSIUM CHLORIDE CRYS ER 20 MEQ PO TBCR: 20.0000 meq | EXTENDED_RELEASE_TABLET | Freq: Every day | ORAL | Status: DC

## 2018-12-25 MED ORDER — INSULIN DETEMIR 100 UNIT/ML ~~LOC~~ SOLN
5.0000 [IU] | Freq: Every day | SUBCUTANEOUS | Status: DC
  Administered 2018-12-25 – 2018-12-26 (×2): 5 [IU] via SUBCUTANEOUS
  Filled 2018-12-25 (×3): qty 0.05

## 2018-12-25 MED ORDER — PANTOPRAZOLE SODIUM 40 MG PO TBEC
40.0000 mg | DELAYED_RELEASE_TABLET | Freq: Every day | ORAL | Status: DC
  Administered 2018-12-26 – 2018-12-29 (×4): 40 mg via ORAL
  Filled 2018-12-25 (×4): qty 1

## 2018-12-25 MED ORDER — ACETAMINOPHEN 325 MG PO TABS: 650.0000 mg | ORAL_TABLET | Freq: Four times a day (QID) | ORAL | Status: DC | PRN

## 2018-12-25 MED ORDER — VITAMIN B-1 100 MG PO TABS
100.0000 mg | ORAL_TABLET | Freq: Every day | ORAL | Status: DC
  Administered 2018-12-25 – 2018-12-29 (×5): 100 mg via ORAL
  Filled 2018-12-25 (×5): qty 1

## 2018-12-25 MED ORDER — OMEGA-3-ACID ETHYL ESTERS 1 G PO CAPS
1.0000 g | ORAL_CAPSULE | Freq: Every day | ORAL | Status: DC
  Administered 2018-12-26 – 2018-12-29 (×4): 1 g via ORAL
  Filled 2018-12-25 (×4): qty 1

## 2018-12-25 MED ORDER — SODIUM CHLORIDE 0.9 % IV BOLUS: 500.0000 mL | Freq: Once | INTRAVENOUS | Status: DC

## 2018-12-25 MED ORDER — ONDANSETRON HCL 4 MG/2ML IJ SOLN: 4.0000 mg | Freq: Four times a day (QID) | INTRAMUSCULAR | Status: DC | PRN

## 2018-12-25 MED ORDER — SODIUM CHLORIDE 0.9% FLUSH: 10.0000 mL | INTRAVENOUS | Status: DC | PRN

## 2018-12-25 MED ORDER — INSULIN ASPART 100 UNIT/ML ~~LOC~~ SOLN
0.0000 [IU] | Freq: Three times a day (TID) | SUBCUTANEOUS | Status: DC
  Administered 2018-12-28: 1 [IU] via SUBCUTANEOUS

## 2018-12-25 MED ORDER — SODIUM CHLORIDE 0.9% FLUSH
10.0000 mL | INTRAVENOUS | Status: DC | PRN
  Administered 2018-12-25: 10 mL
  Filled 2018-12-25: qty 10

## 2018-12-25 MED ORDER — NYSTATIN 100000 UNIT/ML MT SUSP
5.0000 mL | Freq: Four times a day (QID) | OROMUCOSAL | Status: DC
  Administered 2018-12-25 – 2018-12-29 (×13): 500000 [IU] via ORAL
  Filled 2018-12-25 (×13): qty 5

## 2018-12-25 MED ORDER — MAGNESIUM SULFATE 2 GM/50ML IV SOLN
2.0000 g | Freq: Once | INTRAVENOUS | Status: DC
  Filled 2018-12-25: qty 50

## 2018-12-25 MED ORDER — OXYCODONE HCL 5 MG PO TABS: 5.0000 mg | ORAL_TABLET | ORAL | Status: DC | PRN

## 2018-12-25 MED ORDER — SODIUM CHLORIDE 0.9 % IV SOLN
INTRAVENOUS | Status: DC
  Administered 2018-12-25: 16:00:00 via INTRAVENOUS
  Filled 2018-12-25 (×2): qty 250

## 2018-12-25 MED ORDER — SODIUM CHLORIDE 0.9% FLUSH
3.0000 mL | Freq: Two times a day (BID) | INTRAVENOUS | Status: DC
  Administered 2018-12-25 – 2018-12-27 (×4): 3 mL via INTRAVENOUS

## 2018-12-25 MED ORDER — ALBUTEROL SULFATE (2.5 MG/3ML) 0.083% IN NEBU: 2.5000 mg | INHALATION_SOLUTION | Freq: Four times a day (QID) | RESPIRATORY_TRACT | Status: DC | PRN

## 2018-12-25 MED ORDER — VITAMIN B-1 100 MG PO TABS: 100.0000 mg | ORAL_TABLET | Freq: Every day | ORAL | Status: DC

## 2018-12-25 MED ORDER — ONDANSETRON HCL 4 MG PO TABS: 4.0000 mg | ORAL_TABLET | Freq: Four times a day (QID) | ORAL | Status: DC | PRN

## 2018-12-25 MED ORDER — PANCRELIPASE (LIP-PROT-AMYL) 12000-38000 UNITS PO CPEP
36000.0000 [IU] | ORAL_CAPSULE | Freq: Three times a day (TID) | ORAL | Status: DC
  Administered 2018-12-26 – 2018-12-29 (×12): 36000 [IU] via ORAL
  Filled 2018-12-25 (×12): qty 3

## 2018-12-25 MED ORDER — ENOXAPARIN SODIUM 60 MG/0.6ML ~~LOC~~ SOLN
1.0000 mg/kg | SUBCUTANEOUS | Status: DC
  Administered 2018-12-25 – 2018-12-28 (×4): 60 mg via SUBCUTANEOUS
  Filled 2018-12-25 (×4): qty 0.6

## 2018-12-25 MED ORDER — ADULT MULTIVITAMIN W/MINERALS CH
1.0000 | ORAL_TABLET | Freq: Every day | ORAL | Status: DC
  Administered 2018-12-25 – 2018-12-29 (×5): 1 via ORAL
  Filled 2018-12-25 (×5): qty 1

## 2018-12-25 MED ORDER — SODIUM BICARBONATE 8.4 % IV SOLN
INTRAVENOUS | Status: DC
  Filled 2018-12-25 (×2): qty 1000

## 2018-12-25 NOTE — H&P (Signed)
History and Physical    [name] [name] ZSW:[id number] DOB: 1950/11/11 DOA: 12/25/2018  PCP: Janith Lima, MD  [facility] from: [facility]  I have personally briefly reviewed patient's old medical records in [place]  Chief Complaint: Weakness/diarrhea  HPI: [name] [name] is a 68 y.o. 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  Chemotherapy was unable to be done as lab work showed patient was in acute renal failure with a creatinine of 4.42 from 2.07 on 12/11/2018, potassium of 3.2, bicarb of 12, magnesium of 1.7.  CBC with white count of 8.9, hemoglobin of 9.5, platelet count of 344 otherwise was within normal limits.  Patient with complaints of generalized weakness, multiple stools approximately 3/day, decreased oral intake, decreased appetite.  Patient stated that 2 weeks prior to admission had collapsed secondary to weakness and dizziness.  Patient denies any fever, no chills, no constipation, no nausea, no vomiting, no abdominal pain, no melena, no hematemesis, no hematochezia, no dysuria, no asymmetric weakness or numbness.  Patient endorses decreased appetite and decreased energy overall.  Patient also endorses significant recent weight loss. Due to worsening renal function, and dehydration tried hospitalists were called to admit the patient for further evaluation and management.   Review of Systems: As per HPI otherwise 10 point review of systems negative.   Past Medical  History:  Diagnosis Date  . Allergy   . Asthma   . Atrial fibrillation ([place])    a. initially occuring in 2014, has declined anticoagulation since having a GI bleed with initiation of Xarelto  . Cancer ([place])   . Cardiomyopathy 2011   a. Patient was told that he had an ejection fraction of 37% by echo done elsewhere in 2011 b. 06/2014: echo showing preserved EF of 55-60%, severe asymetric septal hypertrophy with no SAM or LVOT gradient  . COPD (chronic obstructive pulmonary disease) ([place])   . Diabetes mellitus without complication ([place])   . Diverticulitis   . GERD (gastroesophageal reflux disease)   . History of colon polyps   . Hypertension   . Tobacco abuse     Past Surgical History:  Procedure Laterality Date  . BILIARY STENT PLACEMENT  07/30/2018   Procedure: BILIARY STENT PLACEMENT;  Surgeon: Ladene Artist, MD;  Location[facility]COPY;  Service: Endoscopy;;  . BILIARY STENT PLACEMENT N/A 08/13/2018   Procedure: BILIARY STENT PLACEMENT;  Surgeon: Milus Banister, MD;  Location: [facility] ENDOSCOPY;  Service: Endoscopy;  Laterality: N/A;  . COLON SURGERY  2009  . COLONOSCOPY    . ERCP    . ERCP N/A 07/30/2018   Procedure: ENDOSCOPIC RETROGRADE CHOLANGIOPANCREATOGRAPHY (ERCP);  Surgeon: Ladene Artist, MD;  Location: [facility] ENDOSCOPY;  Service: Endoscopy;  Laterality: N/A;  . ERCP N/A 08/13/2018   Procedure: ENDOSCOPIC RETROGRADE CHOLANGIOPANCREATOGRAPHY (ERCP);  Surgeon: Milus Banister, MD;  Location: [facility] ENDOSCOPY;  Service: Endoscopy;  Laterality: N/A;  . ESOPHAGOGASTRODUODENOSCOPY N/A 08/13/2018   Procedure: ESOPHAGOGASTRODUODENOSCOPY (EGD);  Surgeon: Milus Banister, MD;  Location: [facility] ENDOSCOPY;  Service: Endoscopy;  Laterality: N/A;  . ESOPHAGOGASTRODUODENOSCOPY (EGD) WITH PROPOFOL N/A 10/08/2018   Procedure:  ESOPHAGOGASTRODUODENOSCOPY (EGD) WITH PROPOFOL;  Surgeon: Milus Banister, MD;  Location: [facility] ENDOSCOPY;  Service: Endoscopy;  Laterality: N/A;  . EUS N/A 08/13/2018   Procedure:  UPPER ENDOSCOPIC ULTRASOUND (EUS) RADIAL;  Surgeon: Milus Banister, MD;  Location: [facility] ENDOSCOPY;  Service: Endoscopy;  Laterality: N/A;  . EUS N/A 10/08/2018   Procedure: UPPER ENDOSCOPIC ULTRASOUND (EUS) RADIAL;  Surgeon: Milus Banister, MD;  Location: [facility] ENDOSCOPY;  Service: Endoscopy;  Laterality: N/A;  . FINE NEEDLE ASPIRATION N/A 08/13/2018   Procedure: FINE NEEDLE ASPIRATION (FNA) LINEAR;  Surgeon: Milus Banister, MD;  Location: [facility] ENDOSCOPY;  Service: Endoscopy;  Laterality: N/A;  . FINE NEEDLE ASPIRATION  10/08/2018   Procedure: FINE NEEDLE ASPIRATION;  Surgeon: Milus Banister, MD;  Location: [facility] ENDOSCOPY;  Service: Endoscopy;;  . IR IMAGING GUIDED PORT INSERTION  11/04/2018  . STENT REMOVAL  08/13/2018   Procedure: STENT REMOVAL;  Surgeon: Milus Banister, MD;  Location: [facility] ENDOSCOPY;  Service: Endoscopy;;     reports that he quit smoking about 7 years ago. His smoking use included cigarettes. He has never used smokeless tobacco. He reports current alcohol use of about 1.0 standard drinks of alcohol per week. He reports that he does not use drugs.  Allergies  Allergen Reactions  . Hydralazine Shortness Of Breath    Heart palpitations  . Lisinopril Cough  . Penicillins Other (See Comments)    [place] DID THE REACTION INVOLVE: Swelling of the face/tongue/throat, SOB, or low BP? No Yes Sudden or severe rash/hives, skin peeling, or the inside of the mouth or nose? No Did it require medical treatment? No When did it last happen?teens  If all above answers are "NO", may proceed with cephalosporin use.  Marland Kitchen Tribenzor [Olmesartan-Amlodipine-Hctz] Nausea Only    dizziness    Family History  Problem Relation Age of Onset  . Prostate cancer Father   . Colon cancer Father   . Diabetes Father   . Arthritis Father   . Diabetes type II Mother   . Atrial fibrillation Mother   . Hypertension Mother   . Arthritis Mother   . Cancer Other        Colon,Breast, Prostate Cancer  .  Hypertension Other   . Prostate cancer Brother        5 brothers - 2 were dx with prostate cancer  . Esophageal cancer Neg Hx   . Stomach cancer Neg Hx   . Rectal cancer Neg Hx    Family history reviewed.  Father deceased at 34 after sustaining a subdural hematoma from a fall.  Father with history of prostate cancer and colon cancer.  Mother deceased age 41 from old age and had chronic pneumonia.  Patient is a retired Sports coach.  Prior to Admission medications   Medication Sig Start Date End Date Taking? Authorizing Provider  acetaminophen (TYLENOL) 500 MG tablet Take 1,000 mg by mouth every 6 (six) hours as needed (pain).    [provider]  albuterol (PROVENTIL HFA;VENTOLIN HFA) 108 (90 Base) MCG/ACT inhaler Inhale 2 puffs into the lungs every 6 (six) hours as needed for wheezing. 09/23/17   Janith Lima, MD  alum & mag hydroxide-simeth (MAALOX/MYLANTA) 200-200-20 MG/5ML suspension Take 30 mLs by mouth every 6 (six) hours as needed for indigestion or heartburn.    [provider]  apixaban (ELIQUIS) 5 MG TABS tablet Take 1 tablet (5 mg total) by mouth 2 (two) times daily. 06/19/18   Lelon Perla, MD  dronabinol (MARINOL) 2.5 MG capsule Take 1 capsule (2.5 mg total) by mouth 2 (two) times daily before lunch and supper. 10/27/18   Janith Lima, MD  fish oil-omega-3 fatty acids 1000 MG capsule Take 2 g by mouth daily.     [provider]  glucose blood (ONETOUCH VERIO) test strip USE AS INSTRUCTED THREE TIMES DAILY. DX CODE E11.21 01/12/18   Philemon Kingdom, MD  Insulin Detemir (LEVEMIR FLEXPEN) 100 UNIT/ML Pen Inject 10 Units into the skin daily. 10/29/18   Janith Lima, MD  insulin regular (NOVOLIN R RELION) 100 units/mL injection INJECT 5-8 UNITS SUBCUTANEOUSLY THREE TIMES DAILY before meals Patient taking differently: Inject 5-8 Units into the skin 3 (three) times daily as needed for high blood sugar (CBG >120).  04/16/17   Philemon Kingdom, MD  Insulin Syringe-Needle U-100 (EASY TOUCH INSULIN SYRINGE) 31G X 5/16" 0.3 ML MISC USE AS DIRECTED 4 TIMES DAILY 05/01/16   Philemon Kingdom, MD  lidocaine-prilocaine (EMLA) cream Apply 1 application topically as needed. 11/09/18   Ladell Pier, MD  lipase/protease/amylase (CREON) 36000 UNITS CPEP capsule Take 1 capsule (36,000 Units total) by mouth 3 (three) times daily before meals. 12/11/18   Ladell Pier, MD  metoprolol succinate (TOPROL-XL) 50 MG 24 hr tablet TAKE 1 TABLET BY MOUTH ONCE DAILY WITH  OR  IMMEDIATELY  FOLLOWING  A  MEAL Patient taking differently: Take 50 mg by mouth daily.  07/20/18   Lelon Perla, MD  omeprazole (PRILOSEC) 20 MG capsule Take 1 capsule (20 mg total) by mouth daily. 09/28/18   Janith Lima, MD  potassium chloride SA (K-DUR) 20 MEQ tablet Take 1 tablet (20 mEq total) by mouth daily. 12/11/18   Ladell Pier, MD  prochlorperazine (COMPAZINE) 10 MG tablet Take 1 tablet (10 mg total) by mouth every 6 (six) hours as needed for nausea or vomiting. 10/30/18   Ladell Pier, MD  thiamine (VITAMIN B-1) 100 MG tablet Take 1 tablet (100 mg total) by mouth daily. 10/30/18   Janith Lima, MD    Physical Exam: There were no vitals filed for this visit.  Constitutional: NAD, calm, comfortable There were no vitals filed for this visit. Eyes: PERRL, lids and conjunctivae normal ENMT: Mucous membranes are dry.  Poor dentition.  Some whitish exudate noted on tongue noted on bugle mucosa. Neck: normal, supple, no masses, no thyromegaly Respiratory: clear to auscultation bilaterally, no wheezing, no crackles. Normal respiratory effort. No accessory muscle use.  Cardiovascular: Regular rate and rhythm, no murmurs / rubs / gallops. No extremity edema. 2+ pedal pulses. No carotid bruits.  Abdomen: no tenderness, no masses palpated. No hepatosplenomegaly. Bowel sounds positive.  Musculoskeletal: no clubbing / cyanosis. No joint deformity upper and lower  extremities. Good ROM, no contractures. Normal muscle tone.  Skin: no rashes, lesions, ulcers. No induration Neurologic: CN 2-12 grossly intact. Sensation intact, DTR normal. Strength 5/5 in all 4.  Psychiatric: Normal judgment and insight. Alert and oriented x 3. Normal mood.   Labs on Admission: I have personally reviewed following labs and imaging studies  CBC: Recent Labs  Lab 12/25/18 1404  WBC 8.9  NEUTROABS 6.1  HGB 9.5*  HCT 29.2*  MCV 98.0  PLT 502   Basic Metabolic Panel: Recent Labs  Lab 12/25/18 1404  NA 139  K 3.2*  CL 117*  CO2 12*  GLUCOSE 119*  BUN 39*  CREATININE 4.42*  CALCIUM 9.3  MG 1.7   GFR: Estimated  Creatinine Clearance: 13.1 mL/min (A) (by C-G formula based on SCr of 4.42 mg/dL [facility])). Liver Function Tests: No results for input(s): AST, ALT, ALKPHOS, BILITOT, PROT, ALBUMIN in the last 168 hours. No results for input(s): LIPASE, AMYLASE in the last 168 hours. No results for input(s): AMMONIA in the last 168 hours. Coagulation Profile: No results for input(s): INR, PROTIME in the last 168 hours. Cardiac Enzymes: No results for input(s): CKTOTAL, CKMB, CKMBINDEX, TROPONINI in the last 168 hours. BNP (last 3 results) No results for input(s): PROBNP in the last 8760 hours. HbA1C: No results for input(s): HGBA1C in the last 72 hours. CBG: No results for input(s): GLUCAP in the last 168 hours. Lipid Profile: No results for input(s): CHOL, HDL, LDLCALC, TRIG, CHOLHDL, LDLDIRECT in the last 72 hours. Thyroid Function Tests: No results for input(s): TSH, T4TOTAL, FREET4, T3FREE, THYROIDAB in the last 72 hours. Anemia Panel: No results for input(s): VITAMINB12, FOLATE, FERRITIN, TIBC, IRON, RETICCTPCT in the last 72 hours. Urine analysis:    Component Value Date/Time   COLORURINE YELLOW 10/19/2018 1120   APPEARANCEUR CLEAR 10/19/2018 1120   LABSPEC 1.014 10/19/2018 1120   PHURINE 6.0 10/19/2018 1120   GLUCOSEU NEGATIVE 10/19/2018 1120    GLUCOSEU NEGATIVE 08/19/2018 1357   HGBUR NEGATIVE 10/19/2018 1120   BILIRUBINUR NEGATIVE 10/19/2018 1120   KETONESUR 5 (A) 10/19/2018 1120   PROTEINUR NEGATIVE 10/19/2018 1120   UROBILINOGEN 0.2 08/19/2018 1357   NITRITE NEGATIVE 10/19/2018 1120   LEUKOCYTESUR NEGATIVE 10/19/2018 1120    Radiological Exams on Admission: No results found.  EKG: None  Assessment/Plan Principal Problem:   ARF (acute renal failure) (HCC) Active Problems:   Essential hypertension, benign   GERD (gastroesophageal reflux disease)   COPD (chronic obstructive pulmonary disease) (HCC)   Cardiomyopathy (HCC)   Hyperlipidemia with target LDL less than 100   Atrial fibrillation (HCC)   Tobacco abuse   Type 2 diabetes mellitus with diabetic nephropathy, with long-term current use of insulin (HCC)   Pancreatic cancer (HCC)   CRI (chronic renal insufficiency), stage 3 (moderate) (HCC)   Malignant cachexia (HCC)   Dehydration   Metabolic acidosis   Anemia associated with chemotherapy   Diarrhea   1 acute renal failure on chronic renal failure stage III Likely secondary to prerenal azotemia in the setting of poor oral intake, ongoing diarrhea.  Patient with a creatinine of 4.42 from 2.07 on 12/11/2018 from 1.26 on 11/20/2018.  Patient also noted to have an elevated BUN of 39.  Patient's noted to have borderline blood pressure with systolics in the low 161W.  Check a urine sodium, check a urine creatinine, check a renal ultrasound.  Place on IV fluids.  Monitor urine output.  Follow.  2.  Hypokalemia Secondary to GI losses secondary to diarrhea.  Check a magnesium level.  Replete.  3.  Metabolic acidosis Likely secondary to problem #1.  Placed on a bicarb drip.  Follow.  4.  Dehydration IV fluids.  5.  Failure to thrive/malignant cachexia Secondary to stage III pancreatic cancer.  Patient with poor oral intake.  Patient with decreased appetite.  Patient lives alone.  Patient with ongoing diarrhea.  Check  a prealbumin level.  Placed on Ensure 3 times a day.  We will liberalize his diet to a regular diet.  If patient continues to decline over the next few weeks may need to consider discussion about palliative care versus hospice however will defer to oncology.  6.  Diarrhea Secondary to pancreatic insufficiency.  Patient unable to afford Creon in the outpatient setting due to its prohibitive cost per patient.  Will place patient on Creon while in-house.  Follow.  7.  Anemia of chemotherapy Patient noted to be anemic hemoglobin of 9.5.  Patient denies any overt bleeding.  Check an anemia panel.  Follow H&H.  Transfusion threshold hemoglobin less than 7.  8.  Well-controlled type 2 diabetes mellitus with neuropathy Last hemoglobin A1c of 6.6 on 10/27/2018.  Will place on half home dose of Lantus at 5 units daily.  Placed on a sliding scale insulin.  Liberalize diet due to failure to thrive/malignant cachexia.  Follow.  9.  Paroxysmal atrial fibrillation Continue beta-blocker for rate control.  Due to worsening renal function will hold patient's Eliquis and resume once renal function improves.  Placed on Lovenox for anticoagulation.  10.  Gastroesophageal reflux disease PPI.  DVT prophylaxis: Lovenox Code Status: Full Family Communication: Updated patient.  No family at bedside. Disposition Plan: To be determined.  Likely home once medically stable and clinically improved. Consults called: Oncology to follow-up on Monday. Admission status: Admit to inpatient.   Irine Seal MD [facility]  If 7PM-7AM, please contact night-coverage [email or web address]  12/25/2018, 6:10 PM

## 2018-12-25 NOTE — Patient Instructions (Signed)
Dehydration, Adult  Dehydration is when there is not enough fluid or water in your body. This happens when you lose more fluids than you take in. Dehydration can range from mild to very bad. It should be treated right away to keep it from getting very bad. Symptoms of mild dehydration may include:  Thirst.  Dry lips.  Slightly dry mouth.  Dry, warm skin.  Dizziness. Symptoms of moderate dehydration may include:  Very dry mouth.  Muscle cramps.  Dark pee (urine). Pee may be the color of tea.  Your body making less pee.  Your eyes making fewer tears.  Heartbeat that is uneven or faster than normal (palpitations).  Headache.  Light-headedness, especially when you stand up from sitting.  Fainting (syncope). Symptoms of very bad dehydration may include:  Changes in skin, such as: ? Cold and clammy skin. ? Blotchy (mottled) or pale skin. ? Skin that does not quickly return to normal after being lightly pinched and let go (poor skin turgor).  Changes in body fluids, such as: ? Feeling very thirsty. ? Your eyes making fewer tears. ? Not sweating when body temperature is high, such as in hot weather. ? Your body making very little pee.  Changes in vital signs, such as: ? Weak pulse. ? Pulse that is more than 100 beats a minute when you are sitting still. ? Fast breathing. ? Low blood pressure.  Other changes, such as: ? Sunken eyes. ? Cold hands and feet. ? Confusion. ? Lack of energy (lethargy). ? Trouble waking up from sleep. ? Short-term weight loss. ? Unconsciousness. Follow these instructions at home:   If told by your doctor, drink an ORS: ? Make an ORS by using instructions on the package. ? Start by drinking small amounts, about  cup (120 mL) every 5-10 minutes. ? Slowly drink more until you have had the amount that your doctor said to have.  Drink enough clear fluid to keep your pee clear or pale yellow. If you were told to drink an ORS, finish the  ORS first, then start slowly drinking clear fluids. Drink fluids such as: ? Water. Do not drink only water by itself. Doing that can make the salt (sodium) level in your body get too low (hyponatremia). ? Ice chips. ? Fruit juice that you have added water to (diluted). ? Low-calorie sports drinks.  Avoid: ? Alcohol. ? Drinks that have a lot of sugar. These include high-calorie sports drinks, fruit juice that does not have water added, and soda. ? Caffeine. ? Foods that are greasy or have a lot of fat or sugar.  Take over-the-counter and prescription medicines only as told by your doctor.  Do not take salt tablets. Doing that can make the salt level in your body get too high (hypernatremia).  Eat foods that have minerals (electrolytes). Examples include bananas, oranges, potatoes, tomatoes, and spinach.  Keep all follow-up visits as told by your doctor. This is important. Contact a doctor if:  You have belly (abdominal) pain that: ? Gets worse. ? Stays in one area (localizes).  You have a rash.  You have a stiff neck.  You get angry or annoyed more easily than normal (irritability).  You are more sleepy than normal.  You have a harder time waking up than normal.  You feel: ? Weak. ? Dizzy. ? Very thirsty.  You have peed (urinated) only a small amount of very dark pee during 6-8 hours. Get help right away if:  You have   symptoms of very bad dehydration.  You cannot drink fluids without throwing up (vomiting).  Your symptoms get worse with treatment.  You have a fever.  You have a very bad headache.  You are throwing up or having watery poop (diarrhea) and it: ? Gets worse. ? Does not go away.  You have blood or something green (bile) in your throw-up.  You have blood in your poop (stool). This may cause poop to look black and tarry.  You have not peed in 6-8 hours.  You pass out (faint).  Your heart rate when you are sitting still is more than 100 beats a  minute.  You have trouble breathing. This information is not intended to replace advice given to you by your health care provider. Make sure you discuss any questions you have with your health care provider. Document Released: 05/11/2009 Document Revised: 02/02/2016 Document Reviewed: 09/08/2015 Elsevier Interactive Patient Education  2019 [employer]  

## 2018-12-25 NOTE — Progress Notes (Addendum)
[facility] OFF[facility] PROGRESS NOTE   Diagnosis: Pancreas cancer  INTERVAL HISTORY:   Mr. Cradle completed cycle 3 gemcitabine/Abraxane 12/11/2018.  He missed a follow-up visit last week.  He denies nausea/vomiting.  No mouth sores.  Diarrhea is better.  The pancreatic enzyme was cost prohibitive.  He is taking about 5 Imodium tablets a day.  He is having 2-3 bowel movements a day.  Last week he reports he "collapsed".  He was lightheaded at the time.  He remained at home and tried to increase fluid intake.  Appetite and energy level overall are poor.  He is no longer lightheaded.  He denies abdominal pain.  Objective:  Vital signs in last 24 hours:  Blood pressure 106/69, pulse 70, temperature 98 F (36.7 C), temperature source Oral, resp. rate 18, height 5\' 10"  (1.778 m), weight 129 lb 4.8 oz (58.7 kg), SpO2 93 %.    HEENT: White coating over tongue.  No buccal thrush. GI: Abdomen soft and nontender.  No hepatomegaly. Vascular: No leg edema.   Port-A-Cath without erythema.  Lab Results:  Lab Results  Component Value Date   WBC 8.9 12/25/2018   HGB 9.5 (L) 12/25/2018   HCT 29.2 (L) 12/25/2018   MCV 98.0 12/25/2018   PLT 344 12/25/2018   NEUTROABS 6.1 12/25/2018    Imaging:  No results found.  Medications: I have reviewed the patient's current medications.  Assessment/Plan: 1. Pancreas cancer clinical stage III (cT4,cN0), adenocarcinoma on FNA biopsy of pancreas mass 10/08/2018 ? Presenting with obstructive jaundice December 2019 ? MRI abdomen 07/28/2018- bile duct obstruction secondary to pancreas head mass ? ERCP/bile duct stent 07/30/2018 ? Repeat ERCP with placement of covered stent 08/13/2018 ? 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 EUS biopsy 10/08/2018- adenocarcinoma ? Cycle 1 gemcitabine/Abraxane 11/06/2018 ? Cycle 2 gemcitabine/Abraxane 11/20/2018  ? Cycle 3 gemcitabine/Abraxane 12/11/2018 2. Diabetes 3. Renal insufficiency 4. COPD 5. History of diverticulitis 6. Cardiomyopathy 7. History of atrial fibrillation 8. History of hypertension 9. Anorexia/weight loss secondary to #1 10. Obstructive jaundice secondary to #1, status post placement of a bile duct stent 11.  Port-A-Cath placement interventional radiology 11/04/2018 12.  Diarrhea-likely secondary to pancreas insufficiency, he will begin pancreatic enzyme replacement  Disposition: Mr. Pinedo has completed 3 cycles of gemcitabine/Abraxane.  His performance status continues to decline.  We reviewed the labs from today.  Creatinine is higher than baseline.  We are holding chemotherapy today.  He will receive IV fluids today and again tomorrow.  He will return for repeat labs 12/28/2018.  We instructed him to hold Eliquis over the weekend due to the elevated creatinine.  He will continue Imodium for diarrhea.  He will return for lab and follow-up on 12/28/2018.  He will contact the office in the interim with any problems.  Patient seen with Dr. Benay Spice.    Ned Card ANP/GNP-BC   12/25/2018  3:02 PM  This was a shared visit with Ned Card.  Mr. Soffer has locally advanced pancreas cancer.  He has completed 3 cycles of gemcitabine/Abraxane.  His performance status remains poor.  The creatinine is higher today, likely reflecting dehydration.  We will administer IV fluids and he will return for reassessment on 12/28/2018.   Addendum: I discussed the case with his daughter by telephone.  She indicates Mr. Wagley is unable to care for himself in the home and has no transportation to the Cancer center.  She planned to take him home to [facility].  She is unable to bring him here for IV fluids tomorrow.  She requests hospital admission until she has arrangements made for relocation to [place].  I discussed the poor prognosis in the setting of locally advanced  pancreas cancer and multiple comorbid conditions.  I will recommend comfort/hospice care if his performance status and renal function do not improve over the next week.  We will contact the hospitalist service and request hospital admission for management of dehydration and renal failure.  I will asked the hospitalist to call oncology as needed over the weekend.  I will see him 12/28/2018 if he remains in the hospital.  Julieanne Manson, MD

## 2018-12-26 ENCOUNTER — Inpatient Hospital Stay (HOSPITAL_COMMUNITY): Payer: Medicare Other

## 2018-12-26 DIAGNOSIS — J42 Unspecified chronic bronchitis: Secondary | ICD-10-CM

## 2018-12-26 DIAGNOSIS — R001 Bradycardia, unspecified: Secondary | ICD-10-CM | POA: Diagnosis present

## 2018-12-26 DIAGNOSIS — Z720 Tobacco use: Secondary | ICD-10-CM

## 2018-12-26 LAB — HEPATIC FUNCTION PANEL
ALT: 11 U/L (ref 0–44)
AST: 14 U/L — ABNORMAL LOW (ref 15–41)
Albumin: 2.8 g/dL — ABNORMAL LOW (ref 3.5–5.0)
Alkaline Phosphatase: 77 U/L (ref 38–126)
Bilirubin, Direct: 0.1 mg/dL (ref 0.0–0.2)
Indirect Bilirubin: 0.4 mg/dL (ref 0.3–0.9)
Total Bilirubin: 0.5 mg/dL (ref 0.3–1.2)
Total Protein: 6.4 g/dL — ABNORMAL LOW (ref 6.5–8.1)

## 2018-12-26 LAB — PHOSPHORUS: Phosphorus: 3.9 mg/dL (ref 2.5–4.6)

## 2018-12-26 LAB — ABO/RH: ABO/RH(D): A POS

## 2018-12-26 LAB — IRON AND TIBC
Iron: 55 ug/dL (ref 45–182)
Saturation Ratios: 28 % (ref 17.9–39.5)
TIBC: 193 ug/dL — ABNORMAL LOW (ref 250–450)
UIBC: 138 ug/dL

## 2018-12-26 LAB — BASIC METABOLIC PANEL
Anion gap: 8 (ref 5–15)
BUN: 40 mg/dL — ABNORMAL HIGH (ref 8–23)
CO2: 15 mmol/L — ABNORMAL LOW (ref 22–32)
Calcium: 8.6 mg/dL — ABNORMAL LOW (ref 8.9–10.3)
Chloride: 116 mmol/L — ABNORMAL HIGH (ref 98–111)
Creatinine, Ser: 3.59 mg/dL — ABNORMAL HIGH (ref 0.61–1.24)
GFR calc Af Amer: 19 mL/min — ABNORMAL LOW (ref >60)
GFR calc non Af Amer: 16 mL/min — ABNORMAL LOW (ref >60)
Glucose, Bld: 119 mg/dL — ABNORMAL HIGH (ref 70–99)
Potassium: 3.1 mmol/L — ABNORMAL LOW (ref 3.5–5.1)
Sodium: 139 mmol/L (ref 135–145)

## 2018-12-26 LAB — URINALYSIS, COMPLETE (UACMP) WITH MICROSCOPIC
Bacteria, UA: NONE SEEN
Bilirubin Urine: NEGATIVE
Glucose, UA: NEGATIVE mg/dL
Ketones, ur: NEGATIVE mg/dL
Leukocytes,Ua: NEGATIVE
Nitrite: NEGATIVE
Protein, ur: 30 mg/dL — AB
Specific Gravity, Urine: 1.014 (ref 1.005–1.030)
pH: 5 (ref 5.0–8.0)

## 2018-12-26 LAB — MAGNESIUM: Magnesium: 1.8 mg/dL (ref 1.7–2.4)

## 2018-12-26 LAB — GLUCOSE, CAPILLARY
Glucose-Capillary: 98 mg/dL (ref 70–99)
Glucose-Capillary: 102 mg/dL — ABNORMAL HIGH (ref 70–99)
Glucose-Capillary: 109 mg/dL — ABNORMAL HIGH (ref 70–99)
Glucose-Capillary: 101 mg/dL — ABNORMAL HIGH (ref 70–99)

## 2018-12-26 LAB — CBC
HCT: 25 % — ABNORMAL LOW (ref 39.0–52.0)
Hemoglobin: 8.1 g/dL — ABNORMAL LOW (ref 13.0–17.0)
MCH: 32 pg (ref 26.0–34.0)
MCHC: 32.4 g/dL (ref 30.0–36.0)
MCV: 98.8 fL (ref 80.0–100.0)
Platelets: 310 10*3/uL (ref 150–400)
RBC: 2.53 MIL/uL — ABNORMAL LOW (ref 4.22–5.81)
RDW: 16.6 % — ABNORMAL HIGH (ref 11.5–15.5)
WBC: 7.7 10*3/uL (ref 4.0–10.5)
nRBC: 0.6 % — ABNORMAL HIGH (ref 0.0–0.2)

## 2018-12-26 LAB — HEMOGLOBIN AND HEMATOCRIT, BLOOD
HCT: 21.7 % — ABNORMAL LOW (ref 39.0–52.0)
Hemoglobin: 7.3 g/dL — ABNORMAL LOW (ref 13.0–17.0)

## 2018-12-26 LAB — FOLATE: Folate: 10.8 ng/mL (ref >5.9)

## 2018-12-26 LAB — CREATININE, URINE, RANDOM: Creatinine, Urine: 150.45 mg/dL

## 2018-12-26 LAB — PREALBUMIN: Prealbumin: 19 mg/dL (ref 18–38)

## 2018-12-26 LAB — FERRITIN: Ferritin: 446 ng/mL — ABNORMAL HIGH (ref 24–336)

## 2018-12-26 LAB — VITAMIN B12: Vitamin B-12: 271 pg/mL (ref 180–914)

## 2018-12-26 LAB — SODIUM, URINE, RANDOM: Sodium, Ur: 28 mmol/L

## 2018-12-26 MED ORDER — POTASSIUM CHLORIDE CRYS ER 20 MEQ PO TBCR
40.0000 meq | EXTENDED_RELEASE_TABLET | Freq: Once | ORAL | Status: AC
  Administered 2018-12-26: 40 meq via ORAL
  Filled 2018-12-26: qty 2

## 2018-12-26 MED ORDER — METOPROLOL SUCCINATE ER 25 MG PO TB24
25.0000 mg | ORAL_TABLET | Freq: Every day | ORAL | Status: DC
  Administered 2018-12-27: 25 mg via ORAL
  Filled 2018-12-26: qty 1

## 2018-12-26 MED ORDER — LOPERAMIDE HCL 2 MG PO CAPS
2.0000 mg | ORAL_CAPSULE | ORAL | Status: DC | PRN
  Administered 2018-12-27: 2 mg via ORAL
  Filled 2018-12-26: qty 1

## 2018-12-26 MED ORDER — LOPERAMIDE HCL 2 MG PO CAPS
4.0000 mg | ORAL_CAPSULE | Freq: Once | ORAL | Status: AC
  Administered 2018-12-26: 4 mg via ORAL
  Filled 2018-12-26: qty 2

## 2018-12-26 MED ORDER — LIP MEDEX EX OINT
TOPICAL_OINTMENT | CUTANEOUS | Status: DC | PRN
  Filled 2018-12-26: qty 7

## 2018-12-26 MED ORDER — MAGNESIUM SULFATE 2 GM/50ML IV SOLN
2.0000 g | Freq: Once | INTRAVENOUS | Status: AC
  Administered 2018-12-26: 2 g via INTRAVENOUS

## 2018-12-26 MED ORDER — POTASSIUM CHLORIDE CRYS ER 20 MEQ PO TBCR
20.0000 meq | EXTENDED_RELEASE_TABLET | Freq: Every day | ORAL | Status: DC
  Administered 2018-12-27: 20 meq via ORAL
  Filled 2018-12-26: qty 1

## 2018-12-26 MED ORDER — CYANOCOBALAMIN 1000 MCG/ML IJ SOLN
1000.0000 ug | Freq: Every day | INTRAMUSCULAR | Status: DC
  Administered 2018-12-26 – 2018-12-29 (×4): 1000 ug via SUBCUTANEOUS
  Filled 2018-12-26 (×4): qty 1

## 2018-12-26 NOTE — Evaluation (Signed)
Occupational Therapy Evaluation Patient Details Name: [name] [name] MRN: [id number] DOB: September 19, 1950 Today's Date: 12/26/2018    History of Present Illness 68 y.o. 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  Chemotherapy was unable to be done as lab work showed patient was in acute renal failure    Clinical Impression   Pt admitted with the above diagnoses and presents with below problem list. Pt will benefit from continued acute OT to address the below listed deficits and maximize independence with basic ADLs. PTA pt was independent with basic ADLs, h/o falls. Pt lives alone and reports he has a flight of stairs to enter his apartment. Adult daughter lives in [place] per pt report. Pt completed functional mobility in the room with O2 sat at 84 on RA. Pt reports he "feels fine." Breathing exercises completed while pt taking standing rest break with O2 recovering to 91. O2 at end of session with pt resting in bed was 93. Given h/o falls, cardiopulmonary status and that pt lives alone, recommending HHOT at d/c.       Follow Up Recommendations  Home health OT    Equipment Recommendations  None recommended by OT(defer to [facility])    Recommendations for Other Services PT consult     Precautions / Restrictions Precautions Precautions: Fall Precaution Comments: watch O2 Restrictions Weight Bearing Restrictions: No      Mobility Bed Mobility Overal bed mobility: Independent                 Transfers Overall transfer level: Needs assistance Equipment used: None Transfers: Sit to/from Stand Sit to Stand: Min guard         General transfer comment: to/from EOB. min guard for safety. assist to manage IV line    Balance Overall balance assessment: Needs assistance Sitting-balance support: No upper extremity supported;Feet supported Sitting balance-Leahy Scale: Good     Standing balance support: No upper extremity supported Standing balance-Leahy Scale: Fair                             ADL either performed or assessed with clinical judgement   ADL Overall ADL's : Needs assistance/impaired Eating/Feeding: Set up;Sitting   Grooming: Supervision/safety;Standing;Sitting   Upper Body Bathing: Set up;Sitting   Lower Body Bathing: Min guard;Sit to/from stand   Upper Body Dressing : Set up;Sitting   Lower Body Dressing: Min guard;Sit to/from stand   Toilet Transfer: Min guard;Ambulation   Toileting- Clothing Manipulation and Hygiene: Min guard;Sit to/from stand   Tub/ Shower Transfer: Min guard;Ambulation   Functional mobility during ADLs: Min guard General ADL Comments: Pt completed functional mobility in the room, navigating turns and obstacles. O2 on RA 84 after ambulating in the room. Recovered to 91 with rest and breathing exercises.     Vision         Perception     Praxis      Pertinent Vitals/Pain Pain Assessment: No/denies pain     Hand Dominance     Extremity/Trunk Assessment Upper Extremity Assessment Upper Extremity Assessment: Generalized weakness;Overall [facility] for tasks assessed  Lower Extremity Assessment Lower Extremity Assessment: Defer to PT evaluation   Cervical / Trunk Assessment Cervical / Trunk Assessment: Normal   Communication Communication Communication: No difficulties   Cognition Arousal/Alertness: Awake/alert Behavior During Therapy: WFL for tasks assessed/performed Overall Cognitive Status: No  family/caregiver present to determine baseline cognitive functioning                                 General Comments: slow processing, impaired problem solving    General Comments       Exercises     Shoulder Instructions      Home Living Family/patient expects to be discharged to:: Private residence Living Arrangements: Alone Available Help at Discharge: Family;Available PRN/intermittently Type of Home: Apartment Home Access: Stairs to enter [employer] of Steps: second floor apartment, full flight Entrance Stairs-Rails: Right;Left Home Layout: One level     Bathroom Shower/Tub: Tub/shower unit         Home Equipment: Walker - 4 wheels;Grab bars - tub/shower   Additional Comments: Pt reports his daughter recently bought him a rollator which he is reluctant to use.       Prior Functioning/Environment Level of Independence: Independent                 OT Problem List: Decreased strength;Decreased activity tolerance;Impaired balance (sitting and/or standing);Decreased cognition;Decreased knowledge of use of DME or AE;Decreased knowledge of precautions;Cardiopulmonary status limiting activity      OT Treatment/Interventions: Self-care/ADL training;Therapeutic exercise;Energy conservation;DME and/or AE instruction;Therapeutic activities;Patient/family education;Balance training    OT Goals(Current goals can be found in the care plan section) Acute Rehab OT Goals Patient Stated Goal: not stated, wants to be able to walk without AD OT Goal Formulation: With patient Time For Goal Achievement: 01/09/19 Potential to Achieve Goals: Good ADL Goals Pt Will Perform Grooming: with modified independence;standing;sitting Pt Will Perform Upper Body Dressing: with modified independence;sitting Pt Will Perform Lower Body Dressing: with modified independence;sit to/from stand Pt Will Transfer to Toilet: with modified independence;ambulating Pt Will  Perform Toileting - Clothing Manipulation and hygiene: with modified independence;sit to/from stand Pt Will Perform Tub/Shower Transfer: with modified independence;ambulating  OT Frequency: Min 2X/week   Barriers to D/C: Decreased caregiver support  adult children live in [place] OT "6 Clicks" Daily Activity     Outcome Measure Help from another person eating meals?: None Help from another person taking care of personal grooming?: None Help from another person toileting, which includes using toliet, bedpan, or urinal?: None Help from another person bathing (including washing, rinsing, drying)?: A Little Help from another person to put on and taking off regular upper body clothing?: None Help from another person to put on and taking off regular lower body clothing?: None 6 Click Score: 23   End of Session Equipment Utilized During Treatment: Other (comment)(intermittently holding onto IV pole) Nurse Communication: Mobility status;Other (comment)(O2 84 after ambualting in room on RA)  Activity Tolerance: Other (comment)(O2 on RA 84 after ambulating in the room) Patient left: in bed;with call bell/phone within reach;with bed alarm set  OT Visit Diagnosis: Unsteadiness on feet (R26.81);History of falling (Z91.81);Muscle weakness (generalized) (M62.81)                Time: 2947-6546 OT Time Calculation (min): 17 min Charges:  OT General Charges $OT Visit: 1  Visit OT Evaluation $OT Eval Low Complexity: [place], OT Acute Rehabilitation Services Pager: [phone] Office: [phone]   Hortencia Pilar 12/26/2018, 11:32 AM

## 2018-12-26 NOTE — Progress Notes (Signed)
PROGRESS NOTE    [name] [name]  ZSW:[id number] DOB: [birth year] DOA: 12/25/2018 PCP: Janith Lima, MD    Brief Narrative:  [name] [name] is a 68 y.o. 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  Chemotherapy was unable to be done as lab work showed patient was in acute renal failure with a creatinine of 4.42 from 2.07 on 12/11/2018, potassium of 3.2, bicarb of 12, magnesium of 1.7.  CBC with white count of 8.9, hemoglobin of 9.5, platelet count of 344 otherwise was within normal limits.  Patient with complaints of generalized weakness, multiple stools approximately 3/day, decreased oral intake, decreased appetite.  Patient stated that 2 weeks prior to admission had collapsed secondary to weakness and dizziness.  Patient denies any fever, no chills, no constipation, no nausea, no vomiting, no abdominal pain, no melena, no hematemesis, no hematochezia, no dysuria, no asymmetric weakness or numbness.  Patient endorses decreased appetite and decreased energy overall.  Patient also endorses significant recent weight loss. Due to worsening renal function, and dehydration tried hospitalists were called to admit the patient for further evaluation and management.   Assessment & Plan:   Principal Problem:   ARF (acute renal failure) (HCC) Active Problems:   Essential hypertension, benign   GERD (gastroesophageal reflux disease)   COPD (chronic obstructive pulmonary disease) (HCC)   Cardiomyopathy (HCC)   Hyperlipidemia  with target LDL less than 100   Atrial fibrillation (HCC)   Tobacco abuse   Type 2 diabetes mellitus with diabetic nephropathy, with long-term current use of insulin (HCC)   Pancreatic cancer (HCC)   CRI (chronic renal insufficiency), stage 3 (moderate) (HCC)   Malignant cachexia (HCC)   Dehydration   Metabolic acidosis   Anemia associated with chemotherapy   Diarrhea   Bradycardia   1 acute renal failure on chronic renal failure stage III Likely secondary to prerenal azotemia in the setting of poor oral intake, ongoing diarrhea.  Patient with a creatinine of 4.42 from 2.07 on 12/11/2018 from 1.26 on 11/20/2018.  Patient also noted to have an elevated BUN of 39.  Patient's noted to have borderline blood pressure with systolics in the low 322G.  Urine sodium and urine creatinine pending.   Urinalysis pending.  Renal ultrasound pending.  Creatinine at 3.59 today from 4.42 on admission.  Continue hydration with IV fluids.  Monitor urine output.  Follow.   2.  Hypokalemia Secondary to GI losses secondary to diarrhea.    Magnesium level at 1.8.  We will give IV magnesium x1 and also gave oral potassium.    3.  Metabolic acidosis Likely secondary to problem #1.    Continue bicarb drip.  Follow.  4.  Dehydration Continue IV fluids.  5.  Failure to thrive/malignant cachexia Secondary to stage III pancreatic cancer.  Patient with poor oral intake.  Patient with decreased appetite.  Patient lives alone.  Patient with ongoing diarrhea.  Check a prealbumin level.  Place on Ensure 3 times a day.    Patient's diet has been liberalized to a regular diet.  If patient continues to decline over the next few weeks may need  to consider discussion about palliative care versus hospice however will defer to oncology.  6.  Diarrhea Secondary to pancreatic insufficiency.  Patient unable to afford Creon in the outpatient setting due to its prohibitive cost per patient.    Continue current dose of Creon.   Place on Imodium as needed.   7.  Anemia of chemotherapy Patient noted to be anemic hemoglobin of 9.5 upon admission.  Patient denies any overt bleeding.  Anemia panel consistent with anemia of chronic disease.  Vitamin B12 levels at 271.  Hemoglobin trending down and currently at 8.1 this morning.  Decreasing hemoglobin also likely dilutional effect and also likely resultant from chemotherapy.  Repeat H&H this afternoon.  Transfusion threshold hemoglobin less than 7.  8.  Well-controlled type 2 diabetes mellitus with neuropathy Last hemoglobin A1c of 6.6 on 10/27/2018.    CBG of 98 this morning.  Continue current dose of Lantus and sliding scale insulin.  Patient's diet has been liberalized due to failure to thrive/malignant cachexia.  Follow.  9.  Paroxysmal atrial fibrillation/bradycardia Patient with bradycardia with heart rates in the 40s when asleep and in the low 50s.  Will decrease beta-blocker dose further down to 25 mg daily and if continued bradycardia will likely need to discontinue Toprol-XL.  Check a EKG.  Due to worsening renal function will continue to hold patient's Eliquis and resume once renal function improves.  Continue Lovenox.   10.  Gastroesophageal reflux disease Continue PPI.  DVT prophylaxis: Lovenox. Code Status: Full Family Communication: Updated patient.  No family at bedside. Disposition Plan: Likely home once acute renal failure has resolved renal function back to baseline, resolution of acidosis, stabilization of hemoglobin.   Consultants:   Oncology  Procedures:   Renal ultrasound pending    Antimicrobials:   None   Subjective: Patient sleeping however easily arousable.  Denies any chest pain or shortness of breath.  Patient noted to have multiple loose stools this morning and given some Imodium.  Patient denies any overt bleeding.  Objective: Vitals:   12/25/18 1813 12/25/18 1854 12/25/18 2017 12/26/18 1257  BP:  (!) 106/59 109/70    Pulse:  (!) 50 75   Resp:  15 16   Temp:  97.6 F (36.4 C) (!) 97.5 F (36.4 C)   TempSrc:  Oral Oral   SpO2:  100% 100% 100%  Weight: 57.8 kg     Height: 5\' 10"  (1.778 m)       Intake/Output Summary (Last 24 hours) at 12/26/2018 1316 Last data filed at 12/26/2018 1036 Gross per 24 hour  Intake 1007.86 ml  Output -  Net 1007.86 ml   Filed Weights   12/25/18 1813  Weight: 57.8 kg    Examination:  General exam: Appears calm and comfortable  Respiratory system: Clear to auscultation. Respiratory effort normal. Cardiovascular system: Bradycardia.  No JVD, no murmurs, no rubs, no gallops.  No lower extremity edema.  Gastrointestinal system: Abdomen is nondistended, soft and nontender. No organomegaly or masses felt. Normal bowel sounds heard. Central nervous system: Alert and oriented. No focal neurological deficits. Extremities: Symmetric 5 x 5 power. Skin: No rashes, lesions or ulcers Psychiatry: Judgement and insight appear normal. Mood & affect appropriate.     Data Reviewed: I have personally reviewed following labs and imaging studies  CBC: Recent Labs  Lab 12/25/18 1404 12/26/18 0426  WBC 8.9 7.7  NEUTROABS 6.1  --   HGB 9.5* 8.1*  HCT 29.2* 25.0*  MCV 98.0 98.8  PLT 344 122   Basic Metabolic Panel: Recent Labs  Lab 12/25/18 1404 12/26/18 0426  NA 139 139  K 3.2* 3.1*  CL 117* 116*  CO2 12* 15*  GLUCOSE 119* 119*  BUN 39* 40*  CREATININE 4.42* 3.59*  CALCIUM 9.3 8.6*  MG 1.7 1.8  PHOS  --  3.9   GFR: Estimated Creatinine Clearance: 15.9 mL/min (A) (by C-G formula based on SCr of 3.59 mg/dL (H)). Liver Function Tests: Recent Labs  Lab 12/26/18 0426  AST 14*  ALT 11  ALKPHOS 77  BILITOT 0.5  PROT 6.4*  ALBUMIN 2.8*   No results for input(s): LIPASE, AMYLASE in the last 168 hours. No results for input(s): AMMONIA in the last 168 hours. Coagulation Profile: No results for input(s): INR, PROTIME in the last 168 hours. Cardiac Enzymes: No  results for input(s): CKTOTAL, CKMB, CKMBINDEX, TROPONINI in the last 168 hours. BNP (last 3 results) No results for input(s): PROBNP in the last 8760 hours. HbA1C: No results for input(s): HGBA1C in the last 72 hours. CBG: Recent Labs  Lab 12/25/18 2011 12/26/18 0759 12/26/18 1136  GLUCAP 95 98 102*   Lipid Profile: No results for input(s): CHOL, HDL, LDLCALC, TRIG, CHOLHDL, LDLDIRECT in the last 72 hours. Thyroid Function Tests: No results for input(s): TSH, T4TOTAL, FREET4, T3FREE, THYROIDAB in the last 72 hours. Anemia Panel: Recent Labs    12/26/18 0426  VITAMINB12 271  FOLATE 10.8  FERRITIN 446*  TIBC 193*  IRON 55   Sepsis Labs: No results for input(s): PROCALCITON, LATICACIDVEN in the last 168 hours.  Recent Results (from the past 240 hour(s))  SARS Coronavirus 2 [facility] order, Performed in [facility] hospital lab)     Status: None   Collection Time: 12/25/18  5:50 PM  Result Value Ref Range Status   SARS Coronavirus 2 NEGATIVE NEGATIVE Final    Comment: (NOTE) If result is NEGATIVE SARS-CoV-2 target nucleic acids are NOT DETECTED. The SARS-CoV-2 RNA is generally detectable in upper and lower  respiratory specimens during the acute phase of infection. The lowest  concentration of SARS-CoV-2 viral copies this assay can detect is 250  copies / mL. A negative result does not preclude SARS-CoV-2 infection  and should not be used as the sole basis for treatment or other  patient management decisions.  A negative result may occur with  improper specimen collection / handling, submission of specimen other  than nasopharyngeal swab, presence of viral mutation(s) within the  areas targeted by this assay, and inadequate number of viral copies  (<250 copies / mL). A negative result must be combined with clinical  observations, patient history, and epidemiological information. If result is POSITIVE SARS-CoV-2 target nucleic acids are DETECTED. The SARS-CoV-2 RNA is  generally detectable in upper and lower  respiratory specimens dur ing the acute phase of infection.  Positive  results are indicative of active infection with SARS-CoV-2.  Clinical  correlation with patient history and other diagnostic information is  necessary to determine patient infection status.  Positive results do  not rule out bacterial infection or co-infection with other viruses. If result is PRESUMPTIVE POSTIVE SARS-CoV-2 nucleic acids MAY BE PRESENT.   A presumptive positive result was obtained on the submitted specimen  and confirmed on repeat testing.  While 2019 novel coronavirus  (SARS-CoV-2) nucleic acids may be present in the submitted sample  additional confirmatory testing may be necessary for epidemiological  and / or clinical management purposes  to differentiate between  SARS-CoV-2 and other Sarbecovirus currently known to infect humans.  If clinically indicated additional testing with an alternate test  methodology [phone]) is advised. The SARS-CoV-2 RNA is generally  detectable in upper and lower respiratory sp ecimens during the acute  phase of infection. The expected result is Negative. Fact Sheet for Patients:  StrictlyIdeas.no Fact Sheet for Healthcare Providers: BankingDealers.co.za This test is not yet approved or cleared by the [place] FDA and has been authorized for detection and/or diagnosis of SARS-CoV-2 by FDA under an Emergency Use Authorization (EUA).  This EUA will remain in effect (meaning this test can be used) for the duration of the COVID-19 declaration under Section 564(b)(1) of the Act, 21 U.S.C. section 360bbb-3(b)(1), unless the authorization is terminated or revoked sooner. Performed at [facility], [place] [address] [place], [place] [address]          Radiology Studies: US Renal  Result Date: 12/26/2018 CLINICAL DATA:  Pancreatic cancer, dehydration, renal  failure EXAM: RENAL / URINARY TRACT ULTRASOUND COMPLETE COMPARISON:  CT abdomen/pelvis dated 10/19/2018 FINDINGS: Right Kidney: Renal measurements: 9.6 x 5.1 x 5.8 cm = volume: 150 mL. 1.5 x 1.5 x 1.4 cm upper pole simple cyst. No hydronephrosis. Left Kidney: Renal measurements: 9.8 x 5.3 x 4.4 cm = volume: 120 mL. Poorly visualized due to overlying bowel. No hydronephrosis. Bladder: Underdistended but grossly unremarkable. IMPRESSION: 1.5 cm right upper pole renal cyst, simple. No hydronephrosis. Electronically Signed   By: Julian Hy M.D.   On: 12/26/2018 11:24        Scheduled Meds: . enoxaparin (LOVENOX) injection  1 mg/kg Subcutaneous Q24H  . folic acid  1 mg Oral Daily  . insulin aspart  0-9 Units Subcutaneous TID WC  . insulin detemir  5 Units Subcutaneous QHS  . lipase/protease/amylase  36,000 Units Oral TID AC  . metoprolol succinate  50 mg Oral Daily  . multivitamin with minerals  1 tablet Oral Daily  . nystatin  5 mL Oral QID  . omega-3 acid ethyl esters  1 g Oral Daily  . pantoprazole  40 mg Oral Daily  . [START ON 12/27/2018] potassium chloride SA  20 mEq Oral Daily  . sodium chloride flush  3 mL Intravenous Q12H  . thiamine  100 mg Oral Daily   Continuous Infusions: . magnesium sulfate bolus IVPB    .  sodium bicarbonate  infusion 1000 mL 100 mL/hr at 12/26/18 1044  . sodium chloride       LOS: 1 day    Time spent: 35 minutes    Irine Seal, MD [facility]  If 7PM-7AM, please contact night-coverage [email or web address] 12/26/2018, 1:16 PM

## 2018-12-27 LAB — CBC WITH DIFFERENTIAL/PLATELET
Abs Immature Granulocytes: 0.09 10*3/uL — ABNORMAL HIGH (ref 0.00–0.07)
Basophils Absolute: 0 10*3/uL (ref 0.0–0.1)
Basophils Relative: 1 %
Eosinophils Absolute: 0.3 10*3/uL (ref 0.0–0.5)
Eosinophils Relative: 4 %
HCT: 23.1 % — ABNORMAL LOW (ref 39.0–52.0)
Hemoglobin: 7.7 g/dL — ABNORMAL LOW (ref 13.0–17.0)
Immature Granulocytes: 1 %
Lymphocytes Relative: 25 %
Lymphs Abs: 2.1 10*3/uL (ref 0.7–4.0)
MCH: 32.6 pg (ref 26.0–34.0)
MCHC: 33.3 g/dL (ref 30.0–36.0)
MCV: 97.9 fL (ref 80.0–100.0)
Monocytes Absolute: 0.9 10*3/uL (ref 0.1–1.0)
Monocytes Relative: 11 %
Neutro Abs: 4.7 10*3/uL (ref 1.7–7.7)
Neutrophils Relative %: 58 %
Platelets: 319 10*3/uL (ref 150–400)
RBC: 2.36 MIL/uL — ABNORMAL LOW (ref 4.22–5.81)
RDW: 16.7 % — ABNORMAL HIGH (ref 11.5–15.5)
WBC: 8.1 10*3/uL (ref 4.0–10.5)
nRBC: 0.5 % — ABNORMAL HIGH (ref 0.0–0.2)

## 2018-12-27 LAB — RENAL FUNCTION PANEL
Albumin: 2.6 g/dL — ABNORMAL LOW (ref 3.5–5.0)
Anion gap: 9 (ref 5–15)
BUN: 32 mg/dL — ABNORMAL HIGH (ref 8–23)
CO2: 20 mmol/L — ABNORMAL LOW (ref 22–32)
Calcium: 8 mg/dL — ABNORMAL LOW (ref 8.9–10.3)
Chloride: 110 mmol/L (ref 98–111)
Creatinine, Ser: 3.01 mg/dL — ABNORMAL HIGH (ref 0.61–1.24)
GFR calc Af Amer: 23 mL/min — ABNORMAL LOW (ref >60)
GFR calc non Af Amer: 20 mL/min — ABNORMAL LOW (ref >60)
Glucose, Bld: 106 mg/dL — ABNORMAL HIGH (ref 70–99)
Phosphorus: 2.9 mg/dL (ref 2.5–4.6)
Potassium: 2.7 mmol/L — CL (ref 3.5–5.1)
Sodium: 139 mmol/L (ref 135–145)

## 2018-12-27 LAB — GLUCOSE, CAPILLARY
Glucose-Capillary: 96 mg/dL (ref 70–99)
Glucose-Capillary: 106 mg/dL — ABNORMAL HIGH (ref 70–99)
Glucose-Capillary: 101 mg/dL — ABNORMAL HIGH (ref 70–99)
Glucose-Capillary: 100 mg/dL — ABNORMAL HIGH (ref 70–99)

## 2018-12-27 LAB — MAGNESIUM: Magnesium: 2.3 mg/dL (ref 1.7–2.4)

## 2018-12-27 MED ORDER — SODIUM CHLORIDE 0.9 % IV BOLUS
500.0000 mL | Freq: Once | INTRAVENOUS | Status: AC
  Administered 2018-12-27: 500 mL via INTRAVENOUS

## 2018-12-27 MED ORDER — POTASSIUM CHLORIDE CRYS ER 20 MEQ PO TBCR
40.0000 meq | EXTENDED_RELEASE_TABLET | Freq: Two times a day (BID) | ORAL | Status: AC
  Administered 2018-12-27 (×2): 40 meq via ORAL
  Filled 2018-12-27 (×2): qty 2

## 2018-12-27 MED ORDER — POTASSIUM CHLORIDE CRYS ER 20 MEQ PO TBCR: 40.0000 meq | EXTENDED_RELEASE_TABLET | Freq: Two times a day (BID) | ORAL | Status: DC

## 2018-12-27 MED ORDER — PRO-STAT SUGAR FREE PO LIQD
30.0000 mL | Freq: Two times a day (BID) | ORAL | Status: DC
  Administered 2018-12-27: 30 mL via ORAL
  Filled 2018-12-27 (×4): qty 30

## 2018-12-27 MED ORDER — BOOST PLUS PO LIQD
237.0000 mL | Freq: Three times a day (TID) | ORAL | Status: DC
  Administered 2018-12-27 – 2018-12-29 (×4): 237 mL via ORAL
  Filled 2018-12-27 (×8): qty 237

## 2018-12-27 NOTE — Progress Notes (Signed)
Initial Nutrition Assessment  RD working remotely.   DOCUMENTATION CODES:   (unable to assess for malnutrition at this time)  INTERVENTION:  - will order Boost Plus TID, each supplement provides 360 kcal and 14 grams of protein. - will order 30 mL Prostat BID, each supplement provides 100 kcal and 15 grams of protein. - continue to encourage PO intakes. - will continue to monitor for additional nutrition-related needs.   NUTRITION DIAGNOSIS:   Increased nutrient needs related to chronic illness, catabolic illness, cancer and cancer related treatments as evidenced by estimated needs.  GOAL:   Patient will meet greater than or equal to 90% of their needs  MONITOR:   PO intake, Supplement acceptance, Labs, Weight trends  REASON FOR ASSESSMENT:   Malnutrition Screening Tool  ASSESSMENT:   68 y.o. male with medical history significant of stage 3 pancreatic cancer, adenocarcinoma on FNA biopsy of the pancreatic mass 3/12 receiving chemotherapy-recently completed cycle 3 of gemcitabine Abraxane on 5/15, type 2 DM with neuropathy and on insulin, COPD, a fib on chronic anticoagulation, HTN, anorexia/weight loss, and ongoing diarrhea felt likely 2/2 pancreatic insufficiency. He presented to the [facility] for chemo which was unable to be done d/t labs showing ARF. Patient reported generalized weakness, multiple stools approximately 3/day, decreased oral intake, decreased appetite. Patient stated that 2 weeks PTA he had collapsed 2/2 weakness and dizziness. Patient admitted for ARF and dehydration.   Patient has been eating 50-75% of meals since admission which he reports is an increase from the amount of food he was consuming PTA. Patient lives alone and family and/or neighbors shop for him and provide him with foods, but, especially recently, he does not have the energy to prepare items and often does not have the desire to eat d/t decreased appetite 2/2 chemo.   He denies abdominal  pain/pressure or N/V PTA. He has been experiencing some taste alterations but is unable to describe this change. Patient was drinking Boost PTA, usually 1-2 bottles/day, and does feel that drinking is easier than the energy required to chew foods.   Patient reports that he has lost 50 lb since he was diagnosed with pancreatic cancer. Per chart review, current weight is 133 lb and weight on 3/31 was 148 lb. This indicates 15 lb weight loss (10% body weight) in the past 2 months; significant for time frame. Patient was dx with pancreatic cancer on 10/08/18. On that date he weighed 164 lb. This indicates 31 lb weight loss (19% body weight) since that date.   Highly suspect some degree of malnutrition but unable to indicate severity without NFPE and/or further information about PO intakes PTA (quantify kcal and protein consumed).     Medications reviewed; 1000 mcg subcutaneous cyanocobalamin/day, 1 mg folvite/day, sliding scale novolog, 5 units levemir/day, 36000 units creon TID, 2 g IV Mg sulfate x1 run 5/29 and x1 run 5/30, daily multivitamin, 5 ml mycostatin QID, 1 g lovaza/day, 20 mEq K-Dur/day, 100 mg oral thiamine/day.  Labs reviewed; CBGs: 96 and 106 mg/dl today, K:2.7 mmol/l, BUN: 32 mg/dl, creatinine: 3.01 mg/dl, Ca: 8 mg/dl, GFR: 23 ml/min.  IVF; D5-150 mEq sodium bicarb @ 100 ml/hr (408 kcal).    NUTRITION - FOCUSED PHYSICAL EXAM:  unable to complete at this time.   Diet Order:   Diet Order            Diet regular Room service appropriate? Yes; Fluid consistency: Thin  Diet effective now  EDUCATION NEEDS:   Not appropriate for education at this time  Skin:  Skin Assessment: Reviewed RN Assessment  Last BM:  5/30  Height:   Ht Readings from Last 1 Encounters:  12/25/18 5\' 10"  (1.778 m)    Weight:   Wt Readings from Last 1 Encounters:  12/27/18 60.5 kg    Ideal Body Weight:  75.4 kg  BMI:  Body mass index is 19.14 kg/m.  Estimated Nutritional Needs:    Kcal:  2115-2360 kcal  Protein:  110-120 grams  Fluid:  >/= 2.2 L/day     Jarome Matin, MS, RD, LDN, [facility] Inpatient Clinical Dietitian Pager # [phone] After hours/weekend pager # [phone]

## 2018-12-27 NOTE — Progress Notes (Addendum)
PROGRESS NOTE    [name] [name]  YBO:[id number] DOB: 1950-10-27 DOA: 12/25/2018 PCP: Janith Lima, MD    Brief Narrative:  [name] [name] is a 68 y.o. 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  Chemotherapy was unable to be done as lab work showed patient was in acute renal failure with a creatinine of 4.42 from 2.07 on 12/11/2018, potassium of 3.2, bicarb of 12, magnesium of 1.7.  CBC with white count of 8.9, hemoglobin of 9.5, platelet count of 344 otherwise was within normal limits.  Patient with complaints of generalized weakness, multiple stools approximately 3/day, decreased oral intake, decreased appetite.  Patient stated that 2 weeks prior to admission had collapsed secondary to weakness and dizziness.  Patient denies any fever, no chills, no constipation, no nausea, no vomiting, no abdominal pain, no melena, no hematemesis, no hematochezia, no dysuria, no asymmetric weakness or numbness.  Patient endorses decreased appetite and decreased energy overall.  Patient also endorses significant recent weight loss. Due to worsening renal function, and dehydration tried hospitalists were called to admit the patient for further evaluation and management.   Assessment & Plan:   Principal Problem:   ARF (acute renal failure) (HCC) Active Problems:   Essential hypertension, benign   GERD (gastroesophageal reflux disease)   COPD (chronic obstructive pulmonary disease) (HCC)   Cardiomyopathy (HCC)   Hyperlipidemia  with target LDL less than 100   Atrial fibrillation (HCC)   Tobacco abuse   Type 2 diabetes mellitus with diabetic nephropathy, with long-term current use of insulin (HCC)   Pancreatic cancer (HCC)   CRI (chronic renal insufficiency), stage 3 (moderate) (HCC)   Malignant cachexia (HCC)   Dehydration   Metabolic acidosis   Anemia associated with chemotherapy   Diarrhea   Bradycardia   1 acute renal failure on chronic renal failure stage III Likely secondary to prerenal azotemia in the setting of poor oral intake, ongoing diarrhea.  Patient with a creatinine of 4.42 from 2.07 on 12/11/2018 from 1.26 on 11/20/2018.  Patient also noted to have an elevated BUN of 39.  Patient's noted to have borderline blood pressure with systolics in the low 277O.  Urine sodium and urine creatinine pending.   Urinalysis pending.  Renal ultrasound negative for hydronephrosis..  Creatinine at 3.01 from 3.59 from 4.42 on admission.  Continue hydration with IV fluids.  We will give a bolus of normal saline 500 cc x1.  Discontinue beta-blocker due to bradycardia.  Monitor urine output.  Follow.   2.  Hypokalemia Secondary to GI losses secondary to diarrhea.    Magnesium level at 2.3.  Potassium at 2.7 this morning.  K. Dur 40 mEq twice daily.  Repeat labs in the morning.   3.  Metabolic acidosis Likely secondary to problem #1.    Improving on bicarb drip which we will continue for now. Follow.  4.  Dehydration Continue hydration with IV fluids.  5.  Failure to thrive/malignant cachexia Secondary to stage III pancreatic cancer.  Patient with poor oral intake.  Patient with decreased appetite.  Patient lives alone.  Patient with ongoing diarrhea.  Prealbumin level of 19.0.  Continue Ensure.  Patient's diet has been liberalized to a regular diet.  If patient continues to decline over the next few weeks may need to consider discussion about palliative care versus hospice however will defer to oncology.  6.   Diarrhea Secondary to pancreatic insufficiency.  Patient unable to afford Creon in the outpatient setting due to its prohibitive cost per patient.    Continue current dose of Creon.  Imodium prn.   7.  Anemia of chemotherapy Patient noted to be anemic hemoglobin of 9.5 upon admission.  Patient denies any overt bleeding.  Anemia panel consistent with anemia of chronic disease.  Dilutional effect.  Vitamin B12 levels at 271.  Hemoglobin trending down and currently at 7.7 this morning.  Decreasing hemoglobin also likely dilutional effect and also likely resultant from chemotherapy. Transfusion threshold hemoglobin less than 7.  8.  Well-controlled type 2 diabetes mellitus with neuropathy Last hemoglobin A1c of 6.6 on 10/27/2018.    CBG of 96 this morning.  Continue sliding scale insulin.  Discontinue Levemir.  Patient's diet has been liberalized due to failure to thrive/malignant cachexia.  Follow.  9.  Paroxysmal atrial fibrillation/bradycardia Patient with bradycardia with heart rates in the 40s-50s.  Beta-blocker was decreased to 25 mg daily however due to ongoing bradycardia will discontinue Toprol-XL.  EKG ordered and pending.  Patient has been switched from Eliquis to Lovenox due to worsening renal function.  Once renal function improves may be able to be started back on Eliquis.  10.  Gastroesophageal reflux disease Continue PPI.  DVT prophylaxis: Lovenox. Code Status: Full Family Communication: Updated patient.  No family at bedside. Disposition Plan: Likely home once acute renal failure has resolved renal function back to baseline, resolution of acidosis, stabilization of hemoglobin.   Consultants:   Oncology  Procedures:   Renal ultrasound 12/26/2018    Antimicrobials:   None   Subjective: Patient laying in bed.  Denies any chest pain or shortness of breath.  States still having multiple loose stools.  Denies any overt bleeding.  No chest pain or shortness of breath.    Objective: Vitals:   12/26/18 1257 12/26/18 1319 12/26/18 2040 12/27/18 0518  BP:  105/61 (!) 104/57 (!) 118/51  Pulse:  (!) 46 (!) 46 (!) 55  Resp:   18 17  Temp:   98.4 F (36.9 C) 98.4 F (36.9 C)  TempSrc:   Oral Oral  SpO2: 100%  100% 100%  Weight:    60.5 kg  Height:        Intake/Output Summary (Last 24 hours) at 12/27/2018 1210 Last data filed at 12/27/2018 4967 Gross per 24 hour  Intake 1088.26 ml  Output --  Net 1088.26 ml   Filed Weights   12/25/18 1813 12/27/18 0518  Weight: 57.8 kg 60.5 kg    Examination:  General exam: NAD Respiratory system: Clear to auscultation bilaterally.  No wheezes, no crackles, no rhonchi.  Normal respiratory effort.   Cardiovascular system: Bradycardia.  No JVD, no murmurs, no rubs, no gallops.  No lower extremity edema.  Gastrointestinal system: Abdomen is soft, nontender, nondistended, positive bowel sounds.  No rebound.  No guarding. Central nervous system: Alert and oriented. No focal neurological deficits. Extremities: Symmetric 5 x 5 power. Skin: No rashes, lesions or ulcers Psychiatry: Judgement and insight appear normal. Mood & affect appropriate.     Data Reviewed: I have personally reviewed following labs and imaging studies  CBC: Recent Labs  Lab 12/25/18  1404 12/26/18 0426 12/26/18 1434 12/27/18 0321  WBC 8.9 7.7  --  8.1  NEUTROABS 6.1  --   --  4.7  HGB 9.5* 8.1* 7.3* 7.7*  HCT 29.2* 25.0* 21.7* 23.1*  MCV 98.0 98.8  --  97.9  PLT 344 310  --  338   Basic Metabolic Panel: Recent Labs  Lab 12/25/18 1404 12/26/18 0426 12/27/18 0321  NA 139 139 139  K 3.2* 3.1* 2.7*  CL 117* 116* 110  CO2 12* 15* 20*  GLUCOSE 119* 119* 106*  BUN 39* 40* 32*  CREATININE 4.42* 3.59* 3.01*  CALCIUM 9.3 8.6* 8.0*  MG 1.7 1.8 2.3  PHOS  --  3.9 2.9   GFR: Estimated Creatinine Clearance: 19.8 mL/min (A) (by C-G formula based on SCr of 3.01 mg/dL (H)). Liver Function Tests: Recent Labs  Lab 12/26/18 0426  12/27/18 0321  AST 14*  --   ALT 11  --   ALKPHOS 77  --   BILITOT 0.5  --   PROT 6.4*  --   ALBUMIN 2.8* 2.6*   No results for input(s): LIPASE, AMYLASE in the last 168 hours. No results for input(s): AMMONIA in the last 168 hours. Coagulation Profile: No results for input(s): INR, PROTIME in the last 168 hours. Cardiac Enzymes: No results for input(s): CKTOTAL, CKMB, CKMBINDEX, TROPONINI in the last 168 hours. BNP (last 3 results) No results for input(s): PROBNP in the last 8760 hours. HbA1C: No results for input(s): HGBA1C in the last 72 hours. CBG: Recent Labs  Lab 12/26/18 1136 12/26/18 1628 12/26/18 2135 12/27/18 0722 12/27/18 1106  GLUCAP 102* 109* 101* 96 106*   Lipid Profile: No results for input(s): CHOL, HDL, LDLCALC, TRIG, CHOLHDL, LDLDIRECT in the last 72 hours. Thyroid Function Tests: No results for input(s): TSH, T4TOTAL, FREET4, T3FREE, THYROIDAB in the last 72 hours. Anemia Panel: Recent Labs    12/26/18 0426  VITAMINB12 271  FOLATE 10.8  FERRITIN 446*  TIBC 193*  IRON 55   Sepsis Labs: No results for input(s): PROCALCITON, LATICACIDVEN in the last 168 hours.  Recent Results (from the past 240 hour(s))  SARS Coronavirus 2 [facility] order, Performed in [facility] hospital lab)     Status: None   Collection Time: 12/25/18  5:50 PM  Result Value Ref Range Status   SARS Coronavirus 2 NEGATIVE NEGATIVE Final    Comment: (NOTE) If result is NEGATIVE SARS-CoV-2 target nucleic acids are NOT DETECTED. The SARS-CoV-2 RNA is generally detectable in upper and lower  respiratory specimens during the acute phase of infection. The lowest  concentration of SARS-CoV-2 viral copies this assay can detect is 250  copies / mL. A negative result does not preclude SARS-CoV-2 infection  and should not be used as the sole basis for treatment or other  patient management decisions.  A negative result may occur with  improper specimen collection / handling,  submission of specimen other  than nasopharyngeal swab, presence of viral mutation(s) within the  areas targeted by this assay, and inadequate number of viral copies  (<250 copies / mL). A negative result must be combined with clinical  observations, patient history, and epidemiological information. If result is POSITIVE SARS-CoV-2 target nucleic acids are DETECTED. The SARS-CoV-2 RNA is generally detectable in upper and lower  respiratory specimens dur ing the acute phase of infection.  Positive  results are indicative of active infection with SARS-CoV-2.  Clinical  correlation with patient history and other diagnostic information is  necessary  to determine patient infection status.  Positive results do  not rule out bacterial infection or co-infection with other viruses. If result is PRESUMPTIVE POSTIVE SARS-CoV-2 nucleic acids MAY BE PRESENT.   A presumptive positive result was obtained on the submitted specimen  and confirmed on repeat testing.  While 2019 novel coronavirus  (SARS-CoV-2) nucleic acids may be present in the submitted sample  additional confirmatory testing may be necessary for epidemiological  and / or clinical management purposes  to differentiate between  SARS-CoV-2 and other Sarbecovirus currently known to infect humans.  If clinically indicated additional testing with an alternate test  methodology [phone]) is advised. The SARS-CoV-2 RNA is generally  detectable in upper and lower respiratory sp ecimens during the acute  phase of infection. The expected result is Negative. Fact Sheet for Patients:  StrictlyIdeas.no Fact Sheet for Healthcare Providers: BankingDealers.co.za This test is not yet approved or cleared by the [place] FDA and has been authorized for detection and/or diagnosis of SARS-CoV-2 by FDA under an Emergency Use Authorization (EUA).  This EUA will remain in effect (meaning this test can be  used) for the duration of the COVID-19 declaration under Section 564(b)(1) of the Act, 21 U.S.C. section 360bbb-3(b)(1), unless the authorization is terminated or revoked sooner. Performed at [facility], [place] [address] [place], [place] [address]          Radiology Studies: US Renal  Result Date: 12/26/2018 CLINICAL DATA:  Pancreatic cancer, dehydration, renal failure EXAM: RENAL / URINARY TRACT ULTRASOUND COMPLETE COMPARISON:  CT abdomen/pelvis dated 10/19/2018 FINDINGS: Right Kidney: Renal measurements: 9.6 x 5.1 x 5.8 cm = volume: 150 mL. 1.5 x 1.5 x 1.4 cm upper pole simple cyst. No hydronephrosis. Left Kidney: Renal measurements: 9.8 x 5.3 x 4.4 cm = volume: 120 mL. Poorly visualized due to overlying bowel. No hydronephrosis. Bladder: Underdistended but grossly unremarkable. IMPRESSION: 1.5 cm right upper pole renal cyst, simple. No hydronephrosis. Electronically Signed   By: Julian Hy M.D.   On: 12/26/2018 11:24        Scheduled Meds:  cyanocobalamin  1,000 mcg Subcutaneous Daily   enoxaparin (LOVENOX) injection  1 mg/kg Subcutaneous O24M   folic acid  1 mg Oral Daily   insulin aspart  0-9 Units Subcutaneous TID WC   insulin detemir  5 Units Subcutaneous QHS   lipase/protease/amylase  36,000 Units Oral TID AC   multivitamin with minerals  1 tablet Oral Daily   nystatin  5 mL Oral QID   omega-3 acid ethyl esters  1 g Oral Daily   pantoprazole  40 mg Oral Daily   potassium chloride SA  20 mEq Oral Daily   potassium chloride  40 mEq Oral BID   sodium chloride flush  3 mL Intravenous Q12H   thiamine  100 mg Oral Daily   Continuous Infusions:  magnesium sulfate bolus IVPB      sodium bicarbonate  infusion 1000 mL 100 mL/hr at 12/27/18 0654   sodium chloride       LOS: 2 days    Time spent: 35 minutes    Irine Seal, MD [facility]  If 7PM-7AM, please contact night-coverage [email or web address] 12/27/2018, 12:10  PM

## 2018-12-27 NOTE — Progress Notes (Signed)
PT Cancellation Note  Patient Details Name: [name] [name] MRN: [id number] DOB: [birth year]   Cancelled Treatment:     PT order received but eval deferred 2* pt potassium @ 2.7.  Will follow in am.  Debe Coder PT [place] Pager [phone] Office [phone]    [place] 12/27/2018, 3:01 PM

## 2018-12-27 NOTE — Progress Notes (Signed)
OT Cancellation Note  Patient Details Name: [name] [name] MRN: [id number] DOB: 1951/02/26   Cancelled Treatment:     Pt chart reviewed, potassium level 2.7 below parameters for OT treatment. Will hold pt until lab redraw confirms this value WNL.   Zenovia Jarred, MSOT, OTR/L Behavioral Health OT/ Acute Relief OT [facility] Office: [place] 12/27/2018, 2:56 PM

## 2018-12-27 NOTE — Progress Notes (Signed)
CRITICAL VALUE ALERT  Critical Value:  Potassium 2.7  Date & Time Notied:  12/27/18@0520   Provider Notified: yes  Orders Received/Actions taken: yes

## 2018-12-28 ENCOUNTER — Telehealth: Payer: Self-pay | Admitting: Nurse Practitioner

## 2018-12-28 DIAGNOSIS — C250 Malignant neoplasm of head of pancreas: Secondary | ICD-10-CM

## 2018-12-28 LAB — RENAL FUNCTION PANEL
Albumin: 2.5 g/dL — ABNORMAL LOW (ref 3.5–5.0)
Anion gap: 7 (ref 5–15)
BUN: 26 mg/dL — ABNORMAL HIGH (ref 8–23)
CO2: 25 mmol/L (ref 22–32)
Calcium: 7.6 mg/dL — ABNORMAL LOW (ref 8.9–10.3)
Chloride: 106 mmol/L (ref 98–111)
Creatinine, Ser: 2.44 mg/dL — ABNORMAL HIGH (ref 0.61–1.24)
GFR calc Af Amer: 30 mL/min — ABNORMAL LOW (ref >60)
GFR calc non Af Amer: 26 mL/min — ABNORMAL LOW (ref >60)
Glucose, Bld: 100 mg/dL — ABNORMAL HIGH (ref 70–99)
Phosphorus: 2.1 mg/dL — ABNORMAL LOW (ref 2.5–4.6)
Potassium: 3 mmol/L — ABNORMAL LOW (ref 3.5–5.1)
Sodium: 138 mmol/L (ref 135–145)

## 2018-12-28 LAB — GLUCOSE, CAPILLARY
Glucose-Capillary: 92 mg/dL (ref 70–99)
Glucose-Capillary: 97 mg/dL (ref 70–99)
Glucose-Capillary: 140 mg/dL — ABNORMAL HIGH (ref 70–99)
Glucose-Capillary: 109 mg/dL — ABNORMAL HIGH (ref 70–99)

## 2018-12-28 LAB — HEMOGLOBIN AND HEMATOCRIT, BLOOD
HCT: 22.6 % — ABNORMAL LOW (ref 39.0–52.0)
Hemoglobin: 7.2 g/dL — ABNORMAL LOW (ref 13.0–17.0)

## 2018-12-28 MED ORDER — POTASSIUM CHLORIDE CRYS ER 20 MEQ PO TBCR
40.0000 meq | EXTENDED_RELEASE_TABLET | ORAL | Status: AC
  Administered 2018-12-28 (×2): 40 meq via ORAL
  Filled 2018-12-28 (×2): qty 2

## 2018-12-28 MED ORDER — POTASSIUM PHOSPHATES 15 MMOLE/5ML IV SOLN
30.0000 mmol | Freq: Once | INTRAVENOUS | Status: AC
  Administered 2018-12-28: 30 mmol via INTRAVENOUS
  Filled 2018-12-28: qty 10

## 2018-12-28 MED ORDER — ALBUMIN HUMAN 25 % IV SOLN
25.0000 g | Freq: Every day | INTRAVENOUS | Status: AC
  Administered 2018-12-28 – 2018-12-29 (×2): 25 g via INTRAVENOUS
  Filled 2018-12-28 (×2): qty 100

## 2018-12-28 MED ORDER — POTASSIUM CHLORIDE CRYS ER 20 MEQ PO TBCR
20.0000 meq | EXTENDED_RELEASE_TABLET | Freq: Every day | ORAL | Status: DC
  Administered 2018-12-29: 20 meq via ORAL
  Filled 2018-12-28: qty 1

## 2018-12-28 NOTE — Progress Notes (Addendum)
Occupational Therapy Discharge Patient Details Name: [name] [name] MRN: [id number] DOB: 16-Jul-1951 Today's Date: 12/28/2018    History of present illness 68 y.o. 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  Chemotherapy was unable to be done as lab work showed patient was in acute renal failure    OT comments  Goals met.  Pt overall mod I with simple ADL activity.  Pts daughters will A pt as needed  Follow Up Recommendations  Home health OT;Supervision - Intermittent    Equipment Recommendations  None recommended by OT    Recommendations for Other Services      Precautions / Restrictions Precautions Precautions: Fall       Mobility Bed Mobility Overal bed mobility: Independent                Transfers Overall transfer level: Modified independent Equipment used: None Transfers: Sit to/from Stand Sit to Stand: Supervision              Balance Overall balance assessment: No apparent balance deficits (not formally assessed)         Standing balance support: No upper extremity supported Standing balance-Leahy Scale: Fair                             ADL either performed or assessed with clinical judgement   ADL Overall ADL's : Modified independent                                       General ADL Comments: Pt feels he is mod I level with simple ADL activity.  Educated pt on safety with ADL activity.  Pt reports he has a grab bar in shower.  Pt does not  feel he needs further OT. Education completed. Will sign off     Vision Patient Visual Report: No change from baseline            Cognition Arousal/Alertness: Awake/alert Behavior During Therapy: WFL for tasks assessed/performed Overall Cognitive Status: Within Functional Limits for tasks assessed                                                     Pertinent Vitals/ Pain       Pain Assessment: No/denies pain  Home Living Family/patient expects to be discharged to:: Private residence Living Arrangements: Alone Available Help at Discharge: Family;Available PRN/intermittently Type of Home: Apartment Home Access: Stairs to enter [employer] of Steps: second floor apartment, full flight Entrance Stairs-Rails: Right;Left Home Layout: One level     Bathroom Shower/Tub: Tub/shower unit         Home Equipment: Walker - 4 wheels;Grab bars - tub/shower   Additional Comments: Pt reports his daughter recently bought him a rollator which he is reluctant to use.       Prior Functioning/Environment Level of Independence:  Independent               Progress Toward Goals  OT Goals(current goals can now be found in the care plan section)  Progress towards OT goals: Goals met/education completed, patient discharged from [place] All goals met and education completed, patient discharged from OT services             Activity Tolerance Patient tolerated treatment well   Patient Left in bed;with call bell/phone within reach   Nurse Communication Mobility status        Time: 1720-1735 OT Time Calculation (min): 15 min  Charges: OT General Charges $OT Visit: 1 Visit OT Treatments $Self Care/Home Management : 8-22 mins  Kari Baars, [place] [phone] Office- [phone], Edwena Felty D 12/28/2018, 6:21 PM

## 2018-12-28 NOTE — Telephone Encounter (Signed)
Per 5/29 los not completed, patient in the hospital.

## 2018-12-28 NOTE — Evaluation (Signed)
Physical Therapy Evaluation Patient Details Name: [name] [name] MRN: [id number] DOB: [birth year] Today's Date: 12/28/2018   History of Present Illness  68 y.o. 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  Chemotherapy was unable to be done as lab work showed patient was in acute renal failure   Clinical Impression  Patient evaluated by Physical Therapy with no further acute PT needs identified. All education has been completed and the patient has no further questions.  Pt mobilizing well and denies any symptoms.  Recommend nursing ambulate with pt during acute stay. See below for any follow-up Physical Therapy or equipment needs. PT is signing off. Thank you for this referral.     Follow Up Recommendations No PT follow up    Equipment Recommendations  None recommended by PT    Recommendations for Other Services       Precautions / Restrictions Precautions Precautions: Fall      Mobility  Bed Mobility Overal bed mobility: Independent                Transfers Overall transfer level: Needs assistance Equipment used: None Transfers: Sit to/from Stand Sit to Stand: Supervision            Ambulation/Gait Ambulation/Gait assistance: Min guard;Supervision Gait Distance (Feet): 120 Feet Assistive device: IV Pole Gait Pattern/deviations: Step-through pattern;Decreased stride length     General Gait Details: steady gait, pushed IV pole, SpO2 91% room air upon return to room however quickly 98%  within 10 seconds  Stairs            Wheelchair Mobility    Modified Rankin (Stroke Patients Only)       Balance           Standing balance support: No upper extremity supported Standing balance-Leahy Scale: Fair                               Pertinent Vitals/Pain Pain Assessment: No/denies pain    Home Living Family/patient expects to be discharged to:: Private residence Living Arrangements: Alone Available Help at Discharge: Family;Available PRN/intermittently Type of Home: Apartment Home Access: Stairs to enter Entrance Stairs-Rails: Right;Left Entrance Stairs-Number of Steps: second floor apartment, full flight Home Layout: One level Home Equipment: Walker - 4 wheels;Grab bars - tub/shower Additional Comments: Pt reports his daughter recently bought him a rollator which he is reluctant to use.     Prior Function Level of Independence: Independent               Hand Dominance        Extremity/Trunk Assessment        Lower Extremity Assessment Lower Extremity Assessment: Overall WFL for tasks assessed    Cervical / Trunk Assessment Cervical / Trunk Assessment: Normal  Communication   Communication: No difficulties  Cognition Arousal/Alertness: Awake/alert Behavior During Therapy: WFL for tasks assessed/performed Overall Cognitive Status: Within Functional Limits for tasks assessed  General Comments      Exercises     Assessment/Plan    PT Assessment Patent does not need any further PT services  PT Problem List         PT Treatment Interventions      PT Goals (Current goals can be found in the Care Plan section)  Acute Rehab PT Goals PT Goal Formulation: All assessment and education complete, DC therapy    Frequency     Barriers to discharge        Co-evaluation               AM-PAC PT "6 Clicks" Mobility  Outcome Measure Help needed turning  from your back to your side while in a flat bed without using bedrails?: None Help needed moving from lying on your back to sitting on the side of a flat bed without using bedrails?: None Help needed moving to and from a bed to a chair (including a wheelchair)?: A Little Help needed standing up from a chair using your arms (e.g., wheelchair or bedside chair)?: A Little Help needed to walk in hospital room?: A Little Help needed climbing 3-5 steps with a railing? : A Little 6 Click Score: 20    End of Session   Activity Tolerance: Patient tolerated treatment well Patient left: in bed;with call bell/phone within reach;with bed alarm set   PT Visit Diagnosis: Difficulty in walking, not elsewhere classified (R26.2)    Time: 8250-5397 PT Time Calculation (min) (ACUTE ONLY): 12 min   Charges:   PT Evaluation $PT Eval Low Complexity: [place], PT, DPT Acute Rehabilitation Services Office: [phone] Pager: [phone]  Trena Platt 12/28/2018, 2:30 PM

## 2018-12-28 NOTE — Progress Notes (Addendum)
HEMATOLOGY-ONCOLOGY PROGRESS NOTE  SUBJECTIVE: Patient reports that he is feeling better today.  Diarrhea has improved.  Denies abdominal pain, nausea, vomiting.  He is eating better.  Denies bleeding. He has no other complaints this morning.    Pancreatic cancer ([place])   10/19/2018 Initial Diagnosis    Pancreatic cancer ([place])    10/22/2018 Cancer Staging    Staging form: Exocrine Pancreas, AJCC 8th Edition - Clinical: Stage III (cT4, cN0, cM0) - Signed by Ladell Pier, MD on 10/22/2018    11/06/2018 -  Chemotherapy    The patient had gemcitabine (GEMZAR) 1,824 mg in sodium chloride 0.9 % 250 mL chemo infusion, 1,000 mg/m2 = 1,824 mg, Intravenous,  Once, 2 of 4 cycles Administration: 1,824 mg (11/06/2018), 1,824 mg (11/20/2018), 1,748 mg (12/11/2018) PACLitaxel-protein bound (ABRAXANE) chemo infusion 175 mg, 100 mg/m2 = 175 mg (100 % of original dose 100 mg/m2), Intravenous, Once, 2 of 4 cycles Dose modification: 100 mg/m2 (original dose 100 mg/m2, Cycle 1, Reason: Provider Judgment) Administration: 175 mg (11/06/2018), 175 mg (11/20/2018), 175 mg (12/11/2018)  for chemotherapy treatment.     11/30/2018 Genetic Testing    Negative genetic testing.  VUS in MUTYH called c.1508G>A.  The Common Hereditary Gene Panel offered by [facility] includes sequencing and/or deletion duplication testing of the following 48 genes: APC, ATM, AXIN2, BARD1, BMPR1A, BRCA1, BRCA2, BRIP1, CDH1, CDK4, CDKN2A (p14ARF), CDKN2A (p16INK4a), CHEK2, CTNNA1, DICER1, EPCAM (Deletion/duplication testing only), GREM1 (promoter region deletion/duplication testing only), KIT, MEN1, MLH1, MSH2, MSH3, MSH6, MUTYH, NBN, NF1, NHTL1, PALB2, PDGFRA, PMS2, POLD1, POLE, PTEN, RAD50, RAD51C, RAD51D, RNF43, SDHB, SDHC, SDHD, SMAD4, SMARCA4. STK11, TP53, TSC1, TSC2, and VHL.  The following genes were evaluated for sequence changes only: SDHA and HOXB13 c.251G>A variant only. The report date is Nov 30, 2018.     REVIEW OF SYSTEMS:   Constitutional:  Denies fevers, chills Eyes: Denies blurriness of vision Ears, nose, mouth, throat, and face: Denies mucositis or sore throat Respiratory: Denies cough, dyspnea or wheezes Cardiovascular: Denies palpitation, chest discomfort Gastrointestinal:  Denies nausea, vomiting, heartburn.  Reports diarrhea has resolved. Skin: Denies abnormal skin rashes Lymphatics: Denies new lymphadenopathy or easy bruising Neurological:Denies numbness, tingling or new weaknesses Behavioral/Psych: Mood is stable, no new changes  Extremities: No lower extremity edema All other systems were reviewed with the patient and are negative.  I have reviewed the past medical history, past surgical history, social history and family history with the patient and they are unchanged from previous note.   PHYSICAL EXAMINATION:  Vitals:   12/27/18 2155 12/28/18 0545  BP: (!) 108/56 119/67  Pulse: (!) 50 (!) 51  Resp: 16 16  Temp: 98.4 F (36.9 C) 98.4 F (36.9 C)  SpO2: 100% 97%   Filed Weights   12/25/18 1813 12/27/18 0518 12/28/18 0545  Weight: 127 lb 6.4 oz (57.8 kg) 133 lb 6.4 oz (60.5 kg) 138 lb 0.1 oz (62.6 kg)    Intake/Output from previous day: 05/31 0701 - 06/01 0700 In: 2294.8 [P.O.:150; I.V.:1643.8; IV Piggyback:501] Out: 1 [Urine:1]  GENERAL:alert, no distress and comfortable SKIN: skin color, texture, turgor are normal, no rashes or significant lesions EYES: normal, Conjunctiva are pink and non-injected, sclera clear OROPHARYNX:no exudate, no erythema and lips, buccal mucosa, and tongue normal  NECK: supple, thyroid normal size, non-tender, without nodularity LYMPH:  no palpable lymphadenopathy in the cervical, axillary or inguinal LUNGS: clear to auscultation and percussion with normal breathing effort HEART: regular rate & rhythm and no murmurs and no lower  extremity edema ABDOMEN:abdomen soft, non-tender and normal bowel sounds Musculoskeletal:no cyanosis of digits and no clubbing  NEURO: alert  & oriented x 3 with fluent speech, no focal motor/sensory deficits  LABORATORY DATA:  I have reviewed the data as listed CMP Latest Ref Rng & Units 12/28/2018 12/27/2018 12/26/2018  Glucose 70 - 99 mg/dL 100(H) 106(H) 119(H)  BUN 8 - 23 mg/dL 26(H) 32(H) 40(H)  Creatinine 0.61 - 1.24 mg/dL 2.44(H) 3.01(H) 3.59(H)  Sodium 135 - 145 mmol/L 138 139 139  Potassium 3.5 - 5.1 mmol/L 3.0(L) 2.7(LL) 3.1(L)  Chloride 98 - 111 mmol/L 106 110 116(H)  CO2 22 - 32 mmol/L 25 20(L) 15(L)  Calcium 8.9 - 10.3 mg/dL 7.6(L) 8.0(L) 8.6(L)  Total Protein 6.5 - 8.1 g/dL - - 6.4(L)  Total Bilirubin 0.3 - 1.2 mg/dL - - 0.5  Alkaline Phos 38 - 126 U/L - - 77  AST 15 - 41 U/L - - 14(L)  ALT 0 - 44 U/L - - 11    Lab Results  Component Value Date   WBC 8.1 12/27/2018   HGB 7.2 (L) 12/28/2018   HCT 22.6 (L) 12/28/2018   MCV 97.9 12/27/2018   PLT 319 12/27/2018   NEUTROABS 4.7 12/27/2018    US Renal  Result Date: 12/26/2018 CLINICAL DATA:  Pancreatic cancer, dehydration, renal failure EXAM: RENAL / URINARY TRACT ULTRASOUND COMPLETE COMPARISON:  CT abdomen/pelvis dated 10/19/2018 FINDINGS: Right Kidney: Renal measurements: 9.6 x 5.1 x 5.8 cm = volume: 150 mL. 1.5 x 1.5 x 1.4 cm upper pole simple cyst. No hydronephrosis. Left Kidney: Renal measurements: 9.8 x 5.3 x 4.4 cm = volume: 120 mL. Poorly visualized due to overlying bowel. No hydronephrosis. Bladder: Underdistended but grossly unremarkable. IMPRESSION: 1.5 cm right upper pole renal cyst, simple. No hydronephrosis. Electronically Signed   By: Julian Hy M.D.   On: 12/26/2018 11:24    ASSESSMENT AND PLAN: 1. Pancreas cancer clinical stage III (cT4,cN0), adenocarcinoma on FNA biopsy of pancreas mass 10/08/2018 ? Presenting with obstructive jaundice December 2019 ? MRI abdomen 07/28/2018-bile duct obstruction secondary to pancreas head mass ? ERCP/bile duct stent 07/30/2018 ? Repeat ERCP with placement of covered stent 08/13/2018 ? CT on  09/10/2018-pancreas mass associated with superior mesenteric artery-partially encased, increased soft tissue adjacent to the celiac axis ? EUS biopsy 10/08/2018-adenocarcinoma ? Cycle 1 gemcitabine/Abraxane 11/06/2018 ? Cycle 2 gemcitabine/Abraxane 11/20/2018 ? Cycle 3 gemcitabine/Abraxane 12/11/2018 2. Diabetes 3. Renal insufficiency 4. COPD 5. History of diverticulitis 6. Cardiomyopathy 7. History of atrial fibrillation 8. History of hypertension 9. Anorexia/weight loss secondary to #1 10. Obstructive jaundice secondary to #1, status post placement of a bile duct stent 11. Port-A-Cath placement interventional radiology 11/04/2018 12.Diarrhea-likely secondary to pancreas insufficiency, he will begin pancreatic enzyme replacement  [name] [name] is improving. He remains anemic which is likely multifactorial due to renal insufficiency and chronic disease.  Renal function continues to improve with hydration.  Diarrhea has resolved.  1.  Monitor hemoglobin and transfuse for hemoglobin less than 7.0 or active bleeding. May need to consider initiating erythropoietin as an outpatient. 2.  Continue IV hydration. 3.  Replete potassium per hospitalist. 4.  Continue Creon. 5.  Disposition: The patient is planning to move to [place], [place] to be closer to his daughter after discharge.  We will look into transferring his care closer to this area.   LOS: 3 days   Mikey Bussing, DNP, AGPCNP-BC, AOCNP 12/28/18  [name] [name] was interviewed and examined.  He reports feeling much  better since hospital admission.  The diarrhea has improved with the addition of Creon.  He plans to relocate to [place] to live near his daughter. I discussed ACLS/CPR issues with [name] [name].  He would like to remain on a full CODE STATUS for now. I contacted his daughter by telephone and discussed the case.  She will take him home with her at discharge. I am contacting the [facility] cancer center at [facility] to make  a medical oncology referral.  I will try to arrange for an appointment next week.  The anemia secondary to chronic disease, chemotherapy, and renal failure.  A trial of erythropoietin can be considered if the hemoglobin continues to fall.  Julieanne Manson, MD

## 2018-12-28 NOTE — Progress Notes (Signed)
Met with patient and team during his established patient appointment on 11/25/18. Patient signed a paper copy DPR which I gave to scheduling to be entered into the EMR.

## 2018-12-28 NOTE — Care Management Important Message (Signed)
Important Message  Patient Details IM Letter given to Nancy Marus RN to present to the Patient Name: [name] [name] MRN: [id number] Date of Birth: [birth year]   Medicare Important Message Given:  Yes    Kerin Salen 12/28/2018, 11:38 AM

## 2018-12-28 NOTE — Progress Notes (Signed)
PROGRESS NOTE    [name] [name]  TDD:[id number] DOB: [birth year] DOA: 12/25/2018 PCP: Janith Lima, MD    Brief Narrative:  [name] [name] is a 68 y.o. 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  Chemotherapy was unable to be done as lab work showed patient was in acute renal failure with a creatinine of 4.42 from 2.07 on 12/11/2018, potassium of 3.2, bicarb of 12, magnesium of 1.7.  CBC with white count of 8.9, hemoglobin of 9.5, platelet count of 344 otherwise was within normal limits.  Patient with complaints of generalized weakness, multiple stools approximately 3/day, decreased oral intake, decreased appetite.  Patient stated that 2 weeks prior to admission had collapsed secondary to weakness and dizziness.  Patient denies any fever, no chills, no constipation, no nausea, no vomiting, no abdominal pain, no melena, no hematemesis, no hematochezia, no dysuria, no asymmetric weakness or numbness.  Patient endorses decreased appetite and decreased energy overall.  Patient also endorses significant recent weight loss. Due to worsening renal function, and dehydration tried hospitalists were called to admit the patient for further evaluation and management.   Assessment & Plan:   Principal Problem:   ARF (acute renal failure) (HCC) Active Problems:   Essential hypertension, benign   GERD (gastroesophageal reflux disease)   COPD (chronic obstructive pulmonary disease) (HCC)   Cardiomyopathy (HCC)   Hyperlipidemia  with target LDL less than 100   Atrial fibrillation (HCC)   Tobacco abuse   Type 2 diabetes mellitus with diabetic nephropathy, with long-term current use of insulin (HCC)   Pancreatic cancer (HCC)   CRI (chronic renal insufficiency), stage 3 (moderate) (HCC)   Malignant cachexia (HCC)   Dehydration   Metabolic acidosis   Anemia associated with chemotherapy   Diarrhea   Bradycardia   1 acute renal failure on chronic renal failure stage III Likely secondary to prerenal azotemia in the setting of poor oral intake, ongoing diarrhea.  Patient with a creatinine of 4.42 from 2.07 on 12/11/2018 from 1.26 on 11/20/2018.  Patient also noted to have an elevated BUN of 39.  Patient's noted to have borderline blood pressure with systolics in the low 623J.  Urine sodium and urine creatinine pending.   Urinalysis pending.  Renal ultrasound negative for hydronephrosis..  Creatinine at 2.44 from 3.01 from 3.59 from 4.42 on admission.  Continue hydration with IV fluids. Discontinued beta-blocker due to bradycardia.  Monitor urine output.  Follow.   2.  Hypokalemia/hypophosphatemia Secondary to GI losses secondary to diarrhea.    Magnesium level at 2.3.  Potassium at 3.0  this morning.  Continue K. Dur 40 mEq twice daily.  Repeat labs in the morning.   3.  Metabolic acidosis Likely secondary to problem #1.    Improving on bicarb drip which we will continue for now. Follow.  4.  Dehydration Continue hydration with IV fluids.   5.  Failure to thrive/malignant cachexia Secondary to stage III pancreatic cancer.  Patient with poor oral intake.  Patient with decreased appetite.  Patient lives alone.  Patient with ongoing diarrhea.    Prealbumin level of 19.0.  Continue Ensure.  Patient's diet has been liberalized to a regular diet.  Will place on IV albumin daily x2 days.  If patient continues to decline over the next few weeks may need to consider discussion about palliative care versus hospice however will  defer to oncology.  6.  Diarrhea Secondary to pancreatic insufficiency.  Patient unable to afford Creon in the outpatient setting due to its prohibitive cost per patient.    Diarrhea improving.  Continue current dose of Creon.  Imodium prn.   7.  Anemia of chemotherapy/low vitamin B12 levels Patient noted to be anemic hemoglobin of 9.5 upon admission.  Patient denies any overt bleeding.  Anemia panel consistent with anemia of chronic disease.  Dilutional effect.  Vitamin B12 levels at 271.  Hemoglobin trending down and currently at 7.2, this morning.  Decreasing hemoglobin also likely dilutional effect and also likely resultant from chemotherapy. Transfusion threshold hemoglobin less than 7.  Continue vitamin B12 IM injections.  8.  Well-controlled type 2 diabetes mellitus with neuropathy Last hemoglobin A1c of 6.6 on 10/27/2018.    CBG of 92 this morning.  Continue sliding scale insulin.  Discontinued Levemir.  Patient's diet has been liberalized due to failure to thrive/malignant cachexia.  Follow.  9.  Paroxysmal atrial fibrillation/bradycardia Patient with bradycardia with heart rates in the 40s-50s.  Beta-blocker was decreased to 25 mg daily however due to ongoing bradycardia Toprol-XL has been discontinued.  Will discontinue Toprol-XL.  EKG sinus bradycardia.  Patient has been switched from Eliquis to Lovenox due to worsening renal function.  Once renal function improves may be able to be started back on Eliquis.  10.  Gastroesophageal reflux disease PPI.    DVT prophylaxis: Lovenox. Code Status: Full Family Communication: Updated patient.  No family at bedside. Disposition Plan: Likely home once acute renal failure has resolved renal function back to baseline, resolution of acidosis, stabilization of hemoglobin.   Consultants:   Oncology  Procedures:   Renal ultrasound 12/26/2018    Antimicrobials:   None   Subjective: Patient laying in bed.  No chest pain.  No  shortness of breath.  States loose stools have improved.  Tolerating current diet.  States good urine output.   Objective: Vitals:   12/27/18 0518 12/27/18 1422 12/27/18 2155 12/28/18 0545  BP: (!) 118/51 (!) 102/45 (!) 108/56 119/67  Pulse: (!) 55 (!) 53 (!) 50 (!) 51  Resp: 17 16 16 16   Temp: 98.4 F (36.9 C) 98.2 F (36.8 C) 98.4 F (36.9 C) 98.4 F (36.9 C)  TempSrc: Oral Oral Oral Oral  SpO2: 100% 99% 100% 97%  Weight: 60.5 kg   62.6 kg  Height:        Intake/Output Summary (Last 24 hours) at 12/28/2018 1136 Last data filed at 12/28/2018 1106 Gross per 24 hour  Intake 2534.81 ml  Output 3 ml  Net 2531.81 ml   Filed Weights   12/25/18 1813 12/27/18 0518 12/28/18 0545  Weight: 57.8 kg 60.5 kg 62.6 kg    Examination:  General exam: No acute distress. Respiratory system: CTA B.  No wheezes, no crackles, no rhonchi.  Normal respiratory effort.  Cardiovascular system: Bradycardia.  No JVD, no murmurs, no rubs, no gallops.  No lower extremity edema.  Gastrointestinal system: Abdomen is soft, nontender, nondistended, positive bowel sounds.  No rebound.  No guarding.  Central nervous system: Alert and oriented. No focal neurological deficits. Extremities: Symmetric 5 x 5 power. Skin: No rashes, lesions or ulcers Psychiatry: Judgement and insight appear  normal. Mood & affect appropriate.     Data Reviewed: I have personally reviewed following labs and imaging studies  CBC: Recent Labs  Lab 12/25/18 1404 12/26/18 0426 12/26/18 1434 12/27/18 0321 12/28/18 0333  WBC 8.9 7.7  --  8.1  --   NEUTROABS 6.1  --   --  4.7  --   HGB 9.5* 8.1* 7.3* 7.7* 7.2*  HCT 29.2* 25.0* 21.7* 23.1* 22.6*  MCV 98.0 98.8  --  97.9  --   PLT 344 310  --  319  --    Basic Metabolic Panel: Recent Labs  Lab 12/25/18 1404 12/26/18 0426 12/27/18 0321 12/28/18 0333  NA 139 139 139 138  K 3.2* 3.1* 2.7* 3.0*  CL 117* 116* 110 106  CO2 12* 15* 20* 25  GLUCOSE 119* 119* 106* 100*  BUN  39* 40* 32* 26*  CREATININE 4.42* 3.59* 3.01* 2.44*  CALCIUM 9.3 8.6* 8.0* 7.6*  MG 1.7 1.8 2.3  --   PHOS  --  3.9 2.9 2.1*   GFR: Estimated Creatinine Clearance: 25.3 mL/min (A) (by C-G formula based on SCr of 2.44 mg/dL (H)). Liver Function Tests: Recent Labs  Lab 12/26/18 0426 12/27/18 0321 12/28/18 0333  AST 14*  --   --   ALT 11  --   --   ALKPHOS 77  --   --   BILITOT 0.5  --   --   PROT 6.4*  --   --   ALBUMIN 2.8* 2.6* 2.5*   No results for input(s): LIPASE, AMYLASE in the last 168 hours. No results for input(s): AMMONIA in the last 168 hours. Coagulation Profile: No results for input(s): INR, PROTIME in the last 168 hours. Cardiac Enzymes: No results for input(s): CKTOTAL, CKMB, CKMBINDEX, TROPONINI in the last 168 hours. BNP (last 3 results) No results for input(s): PROBNP in the last 8760 hours. HbA1C: No results for input(s): HGBA1C in the last 72 hours. CBG: Recent Labs  Lab 12/27/18 0722 12/27/18 1106 12/27/18 1743 12/27/18 2107 12/28/18 0742  GLUCAP 96 106* 101* 100* 92   Lipid Profile: No results for input(s): CHOL, HDL, LDLCALC, TRIG, CHOLHDL, LDLDIRECT in the last 72 hours. Thyroid Function Tests: No results for input(s): TSH, T4TOTAL, FREET4, T3FREE, THYROIDAB in the last 72 hours. Anemia Panel: Recent Labs    12/26/18 0426  VITAMINB12 271  FOLATE 10.8  FERRITIN 446*  TIBC 193*  IRON 55   Sepsis Labs: No results for input(s): PROCALCITON, LATICACIDVEN in the last 168 hours.  Recent Results (from the past 240 hour(s))  SARS Coronavirus 2 [facility] order, Performed in [facility] hospital lab)     Status: None   Collection Time: 12/25/18  5:50 PM  Result Value Ref Range Status   SARS Coronavirus 2 NEGATIVE NEGATIVE Final    Comment: (NOTE) If result is NEGATIVE SARS-CoV-2 target nucleic acids are NOT DETECTED. The SARS-CoV-2 RNA is generally detectable in upper and lower  respiratory specimens during the acute phase of infection.  The lowest  concentration of SARS-CoV-2 viral copies this assay can detect is 250  copies / mL. A negative result does not preclude SARS-CoV-2 infection  and should not be used as the sole basis for treatment or other  patient management decisions.  A negative result may occur with  improper specimen collection / handling, submission of specimen other  than nasopharyngeal swab, presence of viral mutation(s) within the  areas targeted by this assay, and inadequate number of viral copies  (<  250 copies / mL). A negative result must be combined with clinical  observations, patient history, and epidemiological information. If result is POSITIVE SARS-CoV-2 target nucleic acids are DETECTED. The SARS-CoV-2 RNA is generally detectable in upper and lower  respiratory specimens dur ing the acute phase of infection.  Positive  results are indicative of active infection with SARS-CoV-2.  Clinical  correlation with patient history and other diagnostic information is  necessary to determine patient infection status.  Positive results do  not rule out bacterial infection or co-infection with other viruses. If result is PRESUMPTIVE POSTIVE SARS-CoV-2 nucleic acids MAY BE PRESENT.   A presumptive positive result was obtained on the submitted specimen  and confirmed on repeat testing.  While 2019 novel coronavirus  (SARS-CoV-2) nucleic acids may be present in the submitted sample  additional confirmatory testing may be necessary for epidemiological  and / or clinical management purposes  to differentiate between  SARS-CoV-2 and other Sarbecovirus currently known to infect humans.  If clinically indicated additional testing with an alternate test  methodology [id number]) is advised. The SARS-CoV-2 RNA is generally  detectable in upper and lower respiratory sp ecimens during the acute  phase of infection. The expected result is Negative. Fact Sheet for Patients:  StrictlyIdeas.no  Fact Sheet for Healthcare Providers: BankingDealers.co.za This test is not yet approved or cleared by the [place] FDA and has been authorized for detection and/or diagnosis of SARS-CoV-2 by FDA under an Emergency Use Authorization (EUA).  This EUA will remain in effect (meaning this test can be used) for the duration of the COVID-19 declaration under Section 564(b)(1) of the Act, 21 U.S.C. section 360bbb-3(b)(1), unless the authorization is terminated or revoked sooner. Performed at [facility], [place] [address], [place], [place] [address]   Urine culture     Status: Abnormal (Preliminary result)   Collection Time: 12/26/18  4:26 AM  Result Value Ref Range Status   Specimen Description   Final    URINE, RANDOM Performed at [facility] [address] [place], [place] [address]    Special Requests   Final    NONE Performed at [facility], [place] [address], [place], [place] [address]    Culture 20,000 COLONIES/mL GRAM NEGATIVE RODS (A)  Final   Report Status PENDING  Incomplete         Radiology Studies: No results found.      Scheduled Meds: . cyanocobalamin  1,000 mcg Subcutaneous Daily  . enoxaparin (LOVENOX) injection  1 mg/kg Subcutaneous Q24H  . feeding supplement (PRO-STAT SUGAR FREE 64)  30 mL Oral BID  . folic acid  1 mg Oral Daily  . insulin aspart  0-9 Units Subcutaneous TID WC  . lactose free nutrition  237 mL Oral TID WC  . lipase/protease/amylase  36,000 Units Oral TID AC  . multivitamin with minerals  1 tablet Oral Daily  . nystatin  5 mL Oral QID  . omega-3 acid ethyl esters  1 g Oral Daily  . pantoprazole  40 mg Oral Daily  . [START ON 12/29/2018] potassium chloride SA  20 mEq Oral Daily  . potassium chloride  40 mEq Oral Q4H  . sodium chloride flush  3 mL Intravenous Q12H  . thiamine  100 mg Oral Daily   Continuous Infusions: . albumin human 25 g (12/28/18 1100)   . magnesium sulfate bolus IVPB    . potassium PHOSPHATE IVPB (in mmol)    .  sodium bicarbonate  infusion 1000 mL 100 mL/hr at 12/28/18 0350  . sodium chloride       LOS: 3 days    Time spent: 35 minutes    Irine Seal, MD [facility]  If 7PM-7AM, please contact night-coverage [email or web address] 12/28/2018, 11:36 AM

## 2018-12-29 ENCOUNTER — Telehealth: Payer: Self-pay | Admitting: *Deleted

## 2018-12-29 ENCOUNTER — Other Ambulatory Visit: Payer: Self-pay | Admitting: Pharmacist

## 2018-12-29 DIAGNOSIS — N183 Chronic kidney disease, stage 3 (moderate): Secondary | ICD-10-CM

## 2018-12-29 LAB — GLUCOSE, CAPILLARY
Glucose-Capillary: 92 mg/dL (ref 70–99)
Glucose-Capillary: 113 mg/dL — ABNORMAL HIGH (ref 70–99)
Glucose-Capillary: 89 mg/dL (ref 70–99)

## 2018-12-29 LAB — RENAL FUNCTION PANEL
Albumin: 2.5 g/dL — ABNORMAL LOW (ref 3.5–5.0)
Anion gap: 7 (ref 5–15)
BUN: 20 mg/dL (ref 8–23)
CO2: 30 mmol/L (ref 22–32)
Calcium: 7.6 mg/dL — ABNORMAL LOW (ref 8.9–10.3)
Chloride: 102 mmol/L (ref 98–111)
Creatinine, Ser: 2.12 mg/dL — ABNORMAL HIGH (ref 0.61–1.24)
GFR calc Af Amer: 36 mL/min — ABNORMAL LOW (ref >60)
GFR calc non Af Amer: 31 mL/min — ABNORMAL LOW (ref >60)
Glucose, Bld: 92 mg/dL (ref 70–99)
Phosphorus: 3 mg/dL (ref 2.5–4.6)
Potassium: 3.7 mmol/L (ref 3.5–5.1)
Sodium: 139 mmol/L (ref 135–145)

## 2018-12-29 LAB — URINE CULTURE: Culture: 20000 — AB

## 2018-12-29 LAB — HEMOGLOBIN AND HEMATOCRIT, BLOOD
HCT: 22.8 % — ABNORMAL LOW (ref 39.0–52.0)
Hemoglobin: 7.3 g/dL — ABNORMAL LOW (ref 13.0–17.0)

## 2018-12-29 MED ORDER — BOOST PLUS PO LIQD: 237.0000 mL | Freq: Three times a day (TID) | ORAL | 0 refills | Status: AC

## 2018-12-29 MED ORDER — NYSTATIN 100000 UNIT/ML MT SUSP: 5.0000 mL | Freq: Four times a day (QID) | OROMUCOSAL | 0 refills | Status: AC

## 2018-12-29 MED ORDER — SODIUM CHLORIDE 0.9 % IV SOLN
INTRAVENOUS | Status: DC
  Administered 2018-12-29 (×2): via INTRAVENOUS

## 2018-12-29 MED ORDER — PRO-STAT SUGAR FREE PO LIQD: 30.0000 mL | Freq: Two times a day (BID) | ORAL | 0 refills | Status: AC

## 2018-12-29 MED ORDER — FOLIC ACID 1 MG PO TABS: 1.0000 mg | ORAL_TABLET | Freq: Every day | ORAL | Status: AC

## 2018-12-29 MED ORDER — LIP MEDEX EX OINT: TOPICAL_OINTMENT | CUTANEOUS | 0 refills | Status: DC | PRN

## 2018-12-29 MED ORDER — HEPARIN SOD (PORK) LOCK FLUSH 100 UNIT/ML IV SOLN
500.0000 [IU] | INTRAVENOUS | Status: AC | PRN
  Administered 2018-12-29: 500 [IU]

## 2018-12-29 MED ORDER — ADULT MULTIVITAMIN W/MINERALS CH: 1.0000 | ORAL_TABLET | Freq: Every day | ORAL | Status: AC

## 2018-12-29 NOTE — Progress Notes (Signed)
Spoke with Ronny Bacon at [facility] regarding urgent referral for this patient. Chart information faxed. Will have images loaded to [employer].

## 2018-12-29 NOTE — Discharge Summary (Signed)
Physician Discharge Summary  [name] [name] ZDG:[id number] DOB: March 10, 1951 DOA: 12/25/2018  PCP: Janith Lima, MD  Admit date: 12/25/2018 Discharge date: 12/29/2018  Time spent: 55 minutes  Recommendations for Outpatient Follow-up:  1. Follow-up with Janith Lima, MD in 1 week.  On follow-up patient will need a basic metabolic profile done to follow-up on electrolytes and renal function.  Patient also need a CBC done to follow-up on H&H. 2. Follow-up with Dr. Benay Spice, oncology as scheduled.   Discharge Diagnoses:  Principal Problem:   ARF (acute renal failure) (HCC) Active Problems:   Essential hypertension, benign   GERD (gastroesophageal reflux disease)   COPD (chronic obstructive pulmonary disease) (HCC)   Cardiomyopathy (HCC)   Hyperlipidemia with target LDL less than 100   Atrial fibrillation (HCC)   Tobacco abuse   Type 2 diabetes mellitus with diabetic nephropathy, with long-term current use of insulin (HCC)   Pancreatic cancer ([place])   CRI (chronic renal insufficiency), stage 3 (moderate) (HCC)   Malignant cachexia (HCC)   Dehydration   Metabolic acidosis   Anemia associated with chemotherapy   Diarrhea   Bradycardia   Discharge Condition: Stable and improved  Diet recommendation: Regular  Filed Weights   12/25/18 1813 12/27/18 0518 12/28/18 0545  Weight: 57.8 kg 60.5 kg 62.6 kg    History of present illness:  [name] [name] is a 68 y.o. 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   Chemotherapy was unable to be done as lab work showed patient was in acute renal failure with a creatinine of 4.42 from 2.07 on 12/11/2018, potassium of 3.2, bicarb of 12, magnesium of 1.7.  CBC with white count of 8.9, hemoglobin of 9.5, platelet count of 344 otherwise was within normal limits.  Patient with complaints of generalized weakness, multiple stools approximately 3/day, decreased oral intake, decreased appetite.  Patient stated that 2 weeks prior to admission had collapsed secondary to weakness and dizziness.  Patient denies any fever, no chills, no constipation, no nausea, no vomiting, no abdominal pain, no melena, no hematemesis, no hematochezia, no dysuria, no asymmetric weakness or numbness.  Patient endorses decreased appetite and decreased energy overall.  Patient also endorses significant recent weight loss. Due to worsening renal function, and dehydration tried hospitalists were called to admit the patient for further evaluation and management.   Hospital Course:  1 acute renal failure on chronic renal failure stage III Likely secondary to prerenal azotemia in the setting of poor oral intake, ongoing diarrhea. Patient with a creatinine of 4.42 from 2.07 on 12/11/2018 from 1.26 on 11/20/2018. Patient also noted to have an elevated BUN of 39. Patient's noted to have borderline blood pressure with systolics in the low 595G.  Urine sodium noted to be at 28 with a urine creatinine of 150.45.  Urinalysis done was nitrite negative, leukocytes negative.  Renal ultrasound negative for hydronephrosis.  Patient hydrated with IV fluids and renal function improved on a daily basis such that by day of discharge creatinine was down to 2.12 from 4.42 on admission.  Patient was encouraged to increase his oral intake and remain well-hydrated.  Patient's  beta-blocker was discontinued during the hospitalization due to bradycardia.  Outpatient follow-up with PCP.  2. Hypokalemia/hypophosphatemia Secondary  to GI losses secondary to diarrhea.   Magnesium level at 2.3.  Potassium and phosphorus was repleted during the hospitalization.   3. Metabolic acidosis Likely secondary to problem #1. Patient placed on a bicarb drip during the hospitalization and metabolic acidosis had resolved by day of discharge.  4. Dehydration Patient maintained on IV fluids.   5. Failure to thrive/malignant cachexia/stage III pancreatic cancer. Secondary to stage III pancreatic cancer. Patient with poor oral intake. Patient with decreased appetite. Patient lives alone. Patient with ongoing diarrhea.   Prealbumin level of 19.0.    Patient placed on Ensure.  Patient's diet has been liberalized to a regular diet.    Patient received 2 days of IV albumin daily.  Patient's diet improved during the hospitalization.  Patient was followed by oncology during the hospitalization.  Patient has been set up to follow-up with oncology in [place], [place] as patient planning to move in with daughter in [place].  Outpatient follow-up with oncology.  6. Diarrhea Secondary to pancreatic insufficiency. Patient unable to afford Creon in the outpatient setting due to its prohibitive cost per patient.   Diarrhea improved on Creon.  Outpatient follow-up.  7. Anemia of chemotherapy/low vitamin B12 levels Patient noted to be anemic hemoglobin of 9.5 upon admission.  Patient denied any overt bleeding.  Anemia panel consistent with anemia of chronic disease.  Dilutional effect.  Vitamin B12 levels at 271.  Hemoglobin trended down with IV fluids and stabilized at 7.3 by day of discharge.  Patient remained asymptomatic. Decreasing hemoglobin also likely dilutional effect and also likely resultant from chemotherapy.  Patient was placed on vitamin B12 IM injections during the hospitalization be discharged on oral vitamin B12 tablets.  Outpatient follow-up with PCP.    8. Well-controlled type 2 diabetes mellitus with  neuropathy Last hemoglobin A1c of 6.6 on 10/27/2018.   CBG of 92 this morning.    Patient was maintained on sliding scale insulin throughout the hospitalization.  Patient's Levemir was discontinued on discharge and during the hospitalization. Patient's diet has been liberalized due to failure to thrive/malignant cachexia.    Outpatient follow-up.  9. Paroxysmal atrial fibrillation/bradycardia Patient with bradycardia with heart rates in the 40s-50s.  Beta-blocker was decreased to 25 mg daily however due to ongoing bradycardia Toprol-XL has been discontinued.  EKG sinus bradycardia.   Bradycardia has been discontinued on discharge. Patient was initially switched from Eliquis to Lovenox due to worsening renal function.    Renal function improved and patient will be discharged back on home regimen of Eliquis.  Outpatient follow-up.   10. Gastroesophageal reflux disease Patient maintained on a PPI.    Procedures:  Renal ultrasound 12/26/2018  Consultations:  Oncology: Dr. Benay Spice  Discharge Exam: Vitals:   12/29/18 0120 12/29/18 0529  BP:  118/69  Pulse: 61 68  Resp:  18  Temp:  98.4 F (36.9 C)  SpO2:  91%    General: NAD Cardiovascular: RRR Respiratory: CTAB  Discharge Instructions   Discharge Instructions    Diet general   Complete by:  As directed    Increase activity slowly   Complete by:  As directed      Allergies as of 12/29/2018      Reactions   Hydralazine Shortness Of Breath   Heart palpitations   Lisinopril Cough   Penicillins Other (See Comments)   [place] DID THE REACTION INVOLVE: Swelling  of the face/tongue/throat, SOB, or low BP? No Yes Sudden or severe rash/hives, skin peeling, or the inside of the mouth or nose? No Did it require medical treatment? No When did it last happen?teens  If all above answers are "NO", may proceed with cephalosporin use.   Tribenzor [olmesartan-amlodipine-hctz] Nausea Only   dizziness      Medication List     STOP taking these medications   Insulin Detemir 100 UNIT/ML Pen Commonly known as:  Levemir FlexPen   insulin regular 100 units/mL injection Commonly known as:  NovoLIN R ReliOn   metoprolol succinate 50 MG 24 hr tablet Commonly known as:  TOPROL-XL     TAKE these medications   albuterol 108 (90 Base) MCG/ACT inhaler Commonly known as:  VENTOLIN HFA Inhale 2 puffs into the lungs every 6 (six) hours as needed for wheezing.   apixaban 5 MG Tabs tablet Commonly known as:  ELIQUIS Take 1 tablet (5 mg total) by mouth 2 (two) times daily.   dronabinol 2.5 MG capsule Commonly known as:  MARINOL Take 1 capsule (2.5 mg total) by mouth 2 (two) times daily before lunch and supper.   feeding supplement (PRO-STAT SUGAR FREE 64) Liqd Take 30 mLs by mouth 2 (two) times daily.   fish oil-omega-3 fatty acids 1000 MG capsule Take 2 g by mouth daily.   folic acid 1 MG tablet Commonly known as:  FOLVITE Take 1 tablet (1 mg total) by mouth daily. Start taking on:  December 30, 2018   glucose blood test strip Commonly known as:  OneTouch Verio USE AS INSTRUCTED THREE TIMES DAILY. DX CODE E11.21   Insulin Syringe-Needle U-100 31G X 5/16" 0.3 ML Misc Commonly known as:  Easy Touch Insulin Syringe USE AS DIRECTED 4 TIMES DAILY   lactose free nutrition Liqd Take 237 mLs by mouth 3 (three) times daily with meals.   lidocaine-prilocaine cream Commonly known as:  EMLA Apply 1 application topically as needed.   lip balm ointment Apply topically as needed for lip care.   lipase/protease/amylase 36000 UNITS Cpep capsule Commonly known as:  CREON Take 1 capsule (36,000 Units total) by mouth 3 (three) times daily before meals.   multivitamin with minerals Tabs tablet Take 1 tablet by mouth daily. Start taking on:  December 30, 2018   nystatin 100000 UNIT/ML suspension Commonly known as:  MYCOSTATIN Take 5 mLs (500,000 Units total) by mouth 4 (four) times daily for 3 days.   omeprazole 20 MG  capsule Commonly known as:  PRILOSEC Take 1 capsule (20 mg total) by mouth daily.   potassium chloride SA 20 MEQ tablet Commonly known as:  K-DUR Take 1 tablet (20 mEq total) by mouth daily.   prochlorperazine 10 MG tablet Commonly known as:  COMPAZINE Take 1 tablet (10 mg total) by mouth every 6 (six) hours as needed for nausea or vomiting.   tamsulosin 0.4 MG Caps capsule Commonly known as:  FLOMAX Take 0.4 mg by mouth at bedtime.   thiamine 100 MG tablet Commonly known as:  VITAMIN B-1 Take 1 tablet (100 mg total) by mouth daily.      Allergies  Allergen Reactions  . Hydralazine Shortness Of Breath    Heart palpitations  . Lisinopril Cough  . Penicillins Other (See Comments)    [place] DID THE REACTION INVOLVE: Swelling of the face/tongue/throat, SOB, or low BP? No Yes Sudden or severe rash/hives, skin peeling, or the inside of the mouth or nose? No Did it require medical  treatment? No When did it last happen?teens  If all above answers are "NO", may proceed with cephalosporin use.  Marland Kitchen Tribenzor [Olmesartan-Amlodipine-Hctz] Nausea Only    dizziness   Follow-up Information    Janith Lima, MD. Schedule an appointment as soon as possible for a visit in 1 week(s).   Specialty:  Internal Medicine Contact information: [address] [address] [phone]        Lelon Perla, MD .   Specialty:  Cardiology Contact information: [address] [place] [address] [phone]        Ladell Pier, MD. Schedule an appointment as soon as possible for a visit.   Specialty:  Oncology Why:  f/u as scheduled. Contact information: [place] [address] [phone]            The results of significant diagnostics from this hospitalization (including imaging, microbiology, ancillary and laboratory) are listed below for reference.    Significant Diagnostic Studies: US  Renal  Result Date: 24-Jan-2019 CLINICAL DATA:  Pancreatic cancer, dehydration, renal failure EXAM: RENAL / URINARY TRACT ULTRASOUND COMPLETE COMPARISON:  CT abdomen/pelvis dated 10/19/2018 FINDINGS: Right Kidney: Renal measurements: 9.6 x 5.1 x 5.8 cm = volume: 150 mL. 1.5 x 1.5 x 1.4 cm upper pole simple cyst. No hydronephrosis. Left Kidney: Renal measurements: 9.8 x 5.3 x 4.4 cm = volume: 120 mL. Poorly visualized due to overlying bowel. No hydronephrosis. Bladder: Underdistended but grossly unremarkable. IMPRESSION: 1.5 cm right upper pole renal cyst, simple. No hydronephrosis. Electronically Signed   By: Julian Hy M.D.   On: 01/24/19 11:24    Microbiology: Recent Results (from the past 240 hour(s))  SARS Coronavirus 2 [facility] order, Performed in [facility] hospital lab)     Status: None   Collection Time: 12/25/18  5:50 PM  Result Value Ref Range Status   SARS Coronavirus 2 NEGATIVE NEGATIVE Final    Comment: (NOTE) If result is NEGATIVE SARS-CoV-2 target nucleic acids are NOT DETECTED. The SARS-CoV-2 RNA is generally detectable in upper and lower  respiratory specimens during the acute phase of infection. The lowest  concentration of SARS-CoV-2 viral copies this assay can detect is 250  copies / mL. A negative result does not preclude SARS-CoV-2 infection  and should not be used as the sole basis for treatment or other  patient management decisions.  A negative result may occur with  improper specimen collection / handling, submission of specimen other  than nasopharyngeal swab, presence of viral mutation(s) within the  areas targeted by this assay, and inadequate number of viral copies  (<250 copies / mL). A negative result must be combined with clinical  observations, patient history, and epidemiological information. If result is POSITIVE SARS-CoV-2 target nucleic acids are DETECTED. The SARS-CoV-2 RNA is generally detectable in upper and lower  respiratory specimens  dur ing the acute phase of infection.  Positive  results are indicative of active infection with SARS-CoV-2.  Clinical  correlation with patient history and other diagnostic information is  necessary to determine patient infection status.  Positive results do  not rule out bacterial infection or co-infection with other viruses. If result is PRESUMPTIVE POSTIVE SARS-CoV-2 nucleic acids MAY BE PRESENT.   A presumptive positive result was obtained on the submitted specimen  and confirmed on repeat testing.  While 2019 novel coronavirus  (SARS-CoV-2) nucleic acids may be present in the submitted sample  additional confirmatory testing may be necessary  for epidemiological  and / or clinical management purposes  to differentiate between  SARS-CoV-2 and other Sarbecovirus currently known to infect humans.  If clinically indicated additional testing with an alternate test  methodology [phone]) is advised. The SARS-CoV-2 RNA is generally  detectable in upper and lower respiratory sp ecimens during the acute  phase of infection. The expected result is Negative. Fact Sheet for Patients:  StrictlyIdeas.no Fact Sheet for Healthcare Providers: BankingDealers.co.za This test is not yet approved or cleared by the [place] FDA and has been authorized for detection and/or diagnosis of SARS-CoV-2 by FDA under an Emergency Use Authorization (EUA).  This EUA will remain in effect (meaning this test can be used) for the duration of the COVID-19 declaration under Section 564(b)(1) of the Act, 21 U.S.C. section 360bbb-3(b)(1), unless the authorization is terminated or revoked sooner. Performed at [facility], [place] [address], [place], [place] [address]   Urine culture     Status: Abnormal   Collection Time: 12/26/18  4:26 AM  Result Value Ref Range Status   Specimen Description   Final    URINE, RANDOM Performed at [facility] [address] [place], [place] [address]    Special Requests   Final    NONE Performed at [facility], [place] [address], [place], [place] [address]    Culture 20,000 COLONIES/mL KLEBSIELLA PNEUMONIAE (A)  Final   Report Status 12/29/2018 FINAL  Final   Organism ID, Bacteria KLEBSIELLA PNEUMONIAE (A)  Final      Susceptibility   Klebsiella pneumoniae - MIC*    AMPICILLIN >=32 RESISTANT Resistant     CEFAZOLIN <=4 SENSITIVE Sensitive     CEFTRIAXONE <=1 SENSITIVE Sensitive     CIPROFLOXACIN <=0.25 SENSITIVE Sensitive     GENTAMICIN <=1 SENSITIVE Sensitive     IMIPENEM <=0.25 SENSITIVE Sensitive     NITROFURANTOIN 64 INTERMEDIATE Intermediate     TRIMETH/SULFA <=20 SENSITIVE Sensitive     AMPICILLIN/SULBACTAM 8 SENSITIVE Sensitive     PIP/TAZO <=4 SENSITIVE Sensitive     Extended ESBL NEGATIVE Sensitive     * 20,000 COLONIES/mL KLEBSIELLA PNEUMONIAE     Labs: Basic Metabolic Panel: Recent Labs  Lab 12/25/18 1404 12/26/18 0426 12/27/18 0321 12/28/18 0333 12/29/18 0925  NA 139 139 139 138 139  K 3.2* 3.1* 2.7* 3.0* 3.7  CL 117* 116* 110 106 102  CO2 12* 15* 20* 25 30  GLUCOSE 119* 119* 106* 100* 92  BUN 39* 40* 32* 26* 20  CREATININE 4.42* 3.59* 3.01* 2.44* 2.12*  CALCIUM 9.3 8.6* 8.0* 7.6* 7.6*  MG 1.7 1.8 2.3  --   --   PHOS  --  3.9 2.9 2.1* 3.0   Liver Function Tests: Recent Labs  Lab 12/26/18 0426 12/27/18 0321 12/28/18 0333 12/29/18 0925  AST 14*  --   --   --   ALT 11  --   --   --   ALKPHOS 77  --   --   --   BILITOT 0.5  --   --   --   PROT 6.4*  --   --   --   ALBUMIN 2.8* 2.6* 2.5* 2.5*   No results for input(s): LIPASE, AMYLASE in the last 168 hours. No results for input(s): AMMONIA in the last 168 hours. CBC: Recent Labs  Lab 12/25/18 1404 12/26/18 0426 12/26/18 1434 12/27/18 0321 12/28/18 0333 12/29/18 0416  WBC 8.9 7.7  --  8.1  --   --  NEUTROABS 6.1  --   --  4.7  --   --   HGB 9.5* 8.1* 7.3*  7.7* 7.2* 7.3*  HCT 29.2* 25.0* 21.7* 23.1* 22.6* 22.8*  MCV 98.0 98.8  --  97.9  --   --   PLT 344 310  --  319  --   --    Cardiac Enzymes: No results for input(s): CKTOTAL, CKMB, CKMBINDEX, TROPONINI in the last 168 hours. BNP: BNP (last 3 results) No results for input(s): BNP in the last 8760 hours.  ProBNP (last 3 results) No results for input(s): PROBNP in the last 8760 hours.  CBG: Recent Labs  Lab 12/28/18 1201 12/28/18 1639 12/28/18 2034 12/29/18 0736 12/29/18 1156  GLUCAP 97 140* 109* 92 113*       Signed:  Irine Seal MD.  [facility] 12/29/2018, 12:03 PM

## 2018-12-29 NOTE — Telephone Encounter (Signed)
Son, Dr. Joneen Caraway asking to speak with Dr. Benay Spice regarding current condition and his outlook forward in regards to treatment plan and prognosis. Phone [phone]

## 2018-12-29 NOTE — Progress Notes (Signed)
This nurse received pt at 1500 and agree with previous nurse's assessment. Will continue to monitor.

## 2018-12-29 NOTE — Progress Notes (Addendum)
HEMATOLOGY-ONCOLOGY PROGRESS NOTE  SUBJECTIVE: Diarrhea continues to improve. No abdominal pain, nausea, or vomiting. No new complaints today.    Pancreatic cancer ([place])   10/19/2018 Initial Diagnosis    Pancreatic cancer ([place])    10/22/2018 Cancer Staging    Staging form: Exocrine Pancreas, AJCC 8th Edition - Clinical: Stage III (cT4, cN0, cM0) - Signed by Ladell Pier, MD on 10/22/2018    11/06/2018 -  Chemotherapy    The patient had gemcitabine (GEMZAR) 1,824 mg in sodium chloride 0.9 % 250 mL chemo infusion, 1,000 mg/m2 = 1,824 mg, Intravenous,  Once, 2 of 4 cycles Administration: 1,824 mg (11/06/2018), 1,824 mg (11/20/2018), 1,748 mg (12/11/2018) PACLitaxel-protein bound (ABRAXANE) chemo infusion 175 mg, 100 mg/m2 = 175 mg (100 % of original dose 100 mg/m2), Intravenous, Once, 2 of 4 cycles Dose modification: 100 mg/m2 (original dose 100 mg/m2, Cycle 1, Reason: Provider Judgment) Administration: 175 mg (11/06/2018), 175 mg (11/20/2018), 175 mg (12/11/2018)  for chemotherapy treatment.     11/30/2018 Genetic Testing    Negative genetic testing.  VUS in MUTYH called c.1508G>A.  The Common Hereditary Gene Panel offered by [facility] includes sequencing and/or deletion duplication testing of the following 48 genes: APC, ATM, AXIN2, BARD1, BMPR1A, BRCA1, BRCA2, BRIP1, CDH1, CDK4, CDKN2A (p14ARF), CDKN2A (p16INK4a), CHEK2, CTNNA1, DICER1, EPCAM (Deletion/duplication testing only), GREM1 (promoter region deletion/duplication testing only), KIT, MEN1, MLH1, MSH2, MSH3, MSH6, MUTYH, NBN, NF1, NHTL1, PALB2, PDGFRA, PMS2, POLD1, POLE, PTEN, RAD50, RAD51C, RAD51D, RNF43, SDHB, SDHC, SDHD, SMAD4, SMARCA4. STK11, TP53, TSC1, TSC2, and VHL.  The following genes were evaluated for sequence changes only: SDHA and HOXB13 c.251G>A variant only. The report date is Nov 30, 2018.     REVIEW OF SYSTEMS:   Constitutional: Denies fevers, chills Eyes: Denies blurriness of vision Ears, nose, mouth, throat, and face:  Denies mucositis or sore throat Respiratory: Denies cough, dyspnea or wheezes Cardiovascular: Denies palpitation, chest discomfort Gastrointestinal:  Denies nausea, vomiting, heartburn.  Reports diarrhea improved. Skin: Denies abnormal skin rashes Lymphatics: Denies new lymphadenopathy or easy bruising Neurological:Denies numbness, tingling or new weaknesses Behavioral/Psych: Mood is stable, no new changes  Extremities: No lower extremity edema All other systems were reviewed with the patient and are negative.  I have reviewed the past medical history, past surgical history, social history and family history with the patient and they are unchanged from previous note.   PHYSICAL EXAMINATION:  Vitals:   12/29/18 0120 12/29/18 0529  BP:  118/69  Pulse: 61 68  Resp:  18  Temp:  98.4 F (36.9 C)  SpO2:  91%   Filed Weights   12/25/18 1813 12/27/18 0518 12/28/18 0545  Weight: 127 lb 6.4 oz (57.8 kg) 133 lb 6.4 oz (60.5 kg) 138 lb 0.1 oz (62.6 kg)    Intake/Output from previous day: 06/01 0701 - 06/02 0700 In: 240 [P.O.:240] Out: -   GENERAL:alert, no distress and comfortable SKIN: skin color, texture, turgor are normal, no rashes or significant lesions EYES: normal, Conjunctiva are pink and non-injected, sclera clear OROPHARYNX:no exudate, no erythema and lips, buccal mucosa, and tongue normal  NECK: supple, thyroid normal size, non-tender, without nodularity LYMPH:  no palpable lymphadenopathy in the cervical, axillary or inguinal LUNGS: clear to auscultation and percussion with normal breathing effort HEART: regular rate & rhythm and no murmurs and no lower extremity edema ABDOMEN:abdomen soft, non-tender and normal bowel sounds Musculoskeletal:no cyanosis of digits and no clubbing  NEURO: alert & oriented x 3 with fluent speech, no focal motor/sensory  deficits  LABORATORY DATA:  I have reviewed the data as listed CMP Latest Ref Rng & Units 12/28/2018 12/27/2018 12/26/2018   Glucose 70 - 99 mg/dL 100(H) 106(H) 119(H)  BUN 8 - 23 mg/dL 26(H) 32(H) 40(H)  Creatinine 0.61 - 1.24 mg/dL 2.44(H) 3.01(H) 3.59(H)  Sodium 135 - 145 mmol/L 138 139 139  Potassium 3.5 - 5.1 mmol/L 3.0(L) 2.7(LL) 3.1(L)  Chloride 98 - 111 mmol/L 106 110 116(H)  CO2 22 - 32 mmol/L 25 20(L) 15(L)  Calcium 8.9 - 10.3 mg/dL 7.6(L) 8.0(L) 8.6(L)  Total Protein 6.5 - 8.1 g/dL - - 6.4(L)  Total Bilirubin 0.3 - 1.2 mg/dL - - 0.5  Alkaline Phos 38 - 126 U/L - - 77  AST 15 - 41 U/L - - 14(L)  ALT 0 - 44 U/L - - 11    Lab Results  Component Value Date   WBC 8.1 12/27/2018   HGB 7.3 (L) 12/29/2018   HCT 22.8 (L) 12/29/2018   MCV 97.9 12/27/2018   PLT 319 12/27/2018   NEUTROABS 4.7 12/27/2018    US Renal  Result Date: 12/26/2018 CLINICAL DATA:  Pancreatic cancer, dehydration, renal failure EXAM: RENAL / URINARY TRACT ULTRASOUND COMPLETE COMPARISON:  CT abdomen/pelvis dated 10/19/2018 FINDINGS: Right Kidney: Renal measurements: 9.6 x 5.1 x 5.8 cm = volume: 150 mL. 1.5 x 1.5 x 1.4 cm upper pole simple cyst. No hydronephrosis. Left Kidney: Renal measurements: 9.8 x 5.3 x 4.4 cm = volume: 120 mL. Poorly visualized due to overlying bowel. No hydronephrosis. Bladder: Underdistended but grossly unremarkable. IMPRESSION: 1.5 cm right upper pole renal cyst, simple. No hydronephrosis. Electronically Signed   By: Julian Hy M.D.   On: 12/26/2018 11:24    ASSESSMENT AND PLAN: 1. Pancreas cancer clinical stage III (cT4,cN0), adenocarcinoma on FNA biopsy of pancreas mass 10/08/2018 ? Presenting with obstructive jaundice December 2019 ? MRI abdomen 07/28/2018-bile duct obstruction secondary to pancreas head mass ? ERCP/bile duct stent 07/30/2018 ? Repeat ERCP with placement of covered stent 08/13/2018 ? 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 EUS biopsy 10/08/2018-adenocarcinoma ? Cycle 1  gemcitabine/Abraxane 11/06/2018 ? Cycle 2 gemcitabine/Abraxane 11/20/2018 ? Cycle 3 gemcitabine/Abraxane 12/11/2018 2. Diabetes 3. Renal insufficiency 4. COPD 5. History of diverticulitis 6. Cardiomyopathy 7. History of atrial fibrillation 8. History of hypertension 9. Anorexia/weight loss secondary to #1 10. Obstructive jaundice secondary to #1, status post placement of a bile duct stent 11. Port-A-Cath placement interventional radiology 11/04/2018 12.Diarrhea-likely secondary to pancreas insufficiency, he will begin pancreatic enzyme replacement  [name] [name] is improving. He remains anemic which is likely multifactorial due to renal insufficiency and chronic disease. Hgb is stable. Renal function continues to improve with hydration.  Diarrhea continues to improve.  1.  Monitor hemoglobin and transfuse for hemoglobin less than 7.0 or active bleeding. May need to consider initiating erythropoietin as an outpatient. 2.  Continue IV hydration. 3.  Continue Creon. 4.  Disposition: The patient will moving to [place], [place] closer to his daughter after discharge.  We are in the process of arranging outpatient follow-up at a cancer center closer to [place], [place].   LOS: 4 days   Mikey Bussing, DNP, AGPCNP-BC, AOCNP 12/29/18  [name] [name] continues to have an improved performance status.  He will be discharged to live near his daughter in  [place].  I discussed treatment options including comfort care versus continuing systemic therapy.  He would like to continue  a trial of chemotherapy if his performance status remains improved.  I discussed the case with his son by telephone.

## 2018-12-30 ENCOUNTER — Telehealth: Payer: Self-pay

## 2018-12-30 ENCOUNTER — Telehealth: Payer: Self-pay | Admitting: *Deleted

## 2018-12-30 LAB — CANCER ANTIGEN 19-9: CA 19-9: 438 U/mL — ABNORMAL HIGH (ref 0–35)

## 2018-12-30 NOTE — Telephone Encounter (Signed)
Called and left VM regarding assistance with cost of Creon since he can't afford the copay for this medication. Sent message to GI Navigator to call patient.

## 2018-12-30 NOTE — Telephone Encounter (Signed)
Transition Care Management Follow-up Telephone Call   Date discharged? 12/29/18   How have you been since you were released from the hospital? spoke w/daughter Myrtie Hawk) she states dad is living w/her now. He is doing alright    Do you understand why you were in the hospital? YES   Do you understand the discharge instructions? YES   Where were you discharged to? Home   Items Reviewed:  Medications reviewed: YES  Allergies reviewed: YES  Dietary changes reviewed: YES, she states he's on a regular diet. He has no appetite, but we make him eat.    Referrals reviewed: YES, will follow-up w/oncologist in [place]    Functional Questionnaire:   Activities of Daily Living (ADLs):   she states he are independent in the following: bathing and hygiene, feeding, continence, grooming, toileting and dressing States he require assistance with the following: ambulation   Any transportation issues/concerns?: NO   Any patient concerns? YES. Daughter states dad is now staying with her in [place], and he is not able to see PCP in [place]. They are requesting MD to refer him to another primary care in [place]. Inform daughter will send msg to provider, but at the visit can discuss more in detail   Confirmed importance and date/time of follow-up visits scheduled YES, (Virtual) 01/04/19  Provider Appointment booked with Dr. Ronnald Ramp  Confirmed with patient if condition begins to worsen call PCP or go to the ER.  Patient was given the office number and encouraged to call back with question or concerns.  : YES

## 2018-12-30 NOTE — Telephone Encounter (Signed)
Called and spoke with patient's daughter to let her know that referral has been sent to [facility] for transfer of oncology care. Contact information for referral coordinator at [facility]  provided. Daughter has received a call from Nurse Ambassador from [employer]'s patient support program requesting that Dr. Benay Spice fill out a portion of the PAP paperwork. Ms. Valentino Nose will e-mail me the form for completion. She reports that patient is doing well and looks much improved. She will call me for questions or concerns.

## 2018-12-31 NOTE — Telephone Encounter (Signed)
Pt is scheduled for a virtual hosp f/u on 6/8. Pt [place] living w/his daughter in [place], and daughter is requesting pt to be referred, or MD recommend another primary care doctor that is close to them in [place]. She states he is not able to come back and fourth to [employer].Marland KitchenJohny Chess

## 2019-01-01 ENCOUNTER — Ambulatory Visit: Payer: Medicare Other | Admitting: Oncology

## 2019-01-01 ENCOUNTER — Other Ambulatory Visit: Payer: Medicare Other

## 2019-01-01 ENCOUNTER — Ambulatory Visit: Payer: Medicare Other

## 2019-01-01 NOTE — Telephone Encounter (Signed)
Called and spoke with [facility] to check status of referral placed on 5/2. Marland Kitchen Their intake department has not reached out to patient.  They will call patient to day. Daughter e-mailed PAP paperwork from [employer] for Dr. Gearldine Shown signature. Will fax completed paperwork .

## 2019-01-03 NOTE — Telephone Encounter (Signed)
I don't know of anyone in [place]

## 2019-01-04 ENCOUNTER — Encounter: Payer: Self-pay | Admitting: Internal Medicine

## 2019-01-04 ENCOUNTER — Ambulatory Visit (INDEPENDENT_AMBULATORY_CARE_PROVIDER_SITE_OTHER): Payer: Medicare Other | Admitting: Internal Medicine

## 2019-01-04 DIAGNOSIS — E1121 Type 2 diabetes mellitus with diabetic nephropathy: Secondary | ICD-10-CM

## 2019-01-04 DIAGNOSIS — N183 Chronic kidney disease, stage 3 unspecified: Secondary | ICD-10-CM

## 2019-01-04 DIAGNOSIS — Z794 Long term (current) use of insulin: Secondary | ICD-10-CM | POA: Diagnosis not present

## 2019-01-04 DIAGNOSIS — I10 Essential (primary) hypertension: Secondary | ICD-10-CM | POA: Diagnosis not present

## 2019-01-04 NOTE — Progress Notes (Signed)
Virtual Visit via Video Note  I connected with [name] [name] on 01/04/19 at  1:30 PM EDT by a video enabled telemedicine application and verified that I am speaking with the correct person using two identifiers.   I discussed the limitations of evaluation and management by telemedicine and the availability of in person appointments. The patient expressed understanding and agreed to proceed.  History of Present Illness: He checked in for virtual visit.  He was not willing to come in for an in person visit because he iha moved to the [place] area to live near his daughter.  He was recently admitted for dehydration and acute renal failure.  He tells me he is doing much better since discharge with a better appetite, better low energy level, no abdominal pain, and good control of his blood sugars.  He denies any recent episodes of nausea, vomiting, cough, shortness of breath, dizziness, or lightheadedness.  Is optimistic about continuing to treat the prostate cancer.    Observations/Objective: Thin male who appears somewhat older than his stated age.  No distress noted.  He was calm, cooperative, and appropriate.   Lab Results  Component Value Date   WBC 8.1 12/27/2018   HGB 7.3 (L) 12/29/2018   HCT 22.8 (L) 12/29/2018   PLT 319 12/27/2018   GLUCOSE 92 12/29/2018   CHOL 178 02/23/2018   TRIG 108.0 02/23/2018   HDL 48.00 02/23/2018   LDLDIRECT 92.0 04/03/2017   LDLCALC 108 (H) 02/23/2018   ALT 11 12/26/2018   AST 14 (L) 12/26/2018   NA 139 12/29/2018   K 3.7 12/29/2018   CL 102 12/29/2018   CREATININE 2.12 (H) 12/29/2018   BUN 20 12/29/2018   CO2 30 12/29/2018   TSH 1.93 08/19/2018   PSA 1.32 09/16/2016   INR 1.0 11/04/2018   HGBA1C 6.6 (A) 10/27/2018   MICROALBUR 3.3 (H) 02/23/2018     Assessment and Plan: He seems to be improved since his recent admission.  He tells me he has an appointment next week with an oncologist in the [place] area named Dr. Burke Keels.  If he needs a  [facility] I recommended that he ask Dr. Burke Keels about a referral to a local PCP.  He will continue all the current listed meds and will let me know if he develops any new or worsening symptoms.   Follow Up Instructions: I informed him that I am available to help him if he needs me in the interim while he is looking for a PCP in the [place] area.  He will let me know if he develops any new or worsening symptoms.    I discussed the assessment and treatment plan with the patient. The patient was provided an opportunity to ask questions and all were answered. The patient agreed with the plan and demonstrated an understanding of the instructions.   The patient was advised to call back or seek an in-person evaluation if the symptoms worsen or if the condition fails to improve as anticipated.  I provided 15 minutes of non-face-to-face time during this encounter.   Scarlette Calico, MD

## 2019-01-04 NOTE — Telephone Encounter (Signed)
Called pt/daughter no answer LMOM w/MD response...[name] [name]

## 2019-01-05 NOTE — Progress Notes (Signed)
Completed packet for [employer] Patient Ass[employer] Program ([employer])  faxed to [phone]

## 2019-01-12 DIAGNOSIS — N189 Chronic kidney disease, unspecified: Secondary | ICD-10-CM | POA: Diagnosis not present

## 2019-01-12 DIAGNOSIS — C259 Malignant neoplasm of pancreas, unspecified: Secondary | ICD-10-CM | POA: Diagnosis not present

## 2019-01-12 DIAGNOSIS — Z5111 Encounter for antineoplastic chemotherapy: Secondary | ICD-10-CM | POA: Diagnosis not present

## 2019-01-12 DIAGNOSIS — C250 Malignant neoplasm of head of pancreas: Secondary | ICD-10-CM | POA: Diagnosis not present

## 2019-01-14 NOTE — Progress Notes (Signed)
At the request of [place] [facility] results faxed to [facility] at [phone]

## 2019-01-15 DIAGNOSIS — Z5111 Encounter for antineoplastic chemotherapy: Secondary | ICD-10-CM | POA: Diagnosis not present

## 2019-01-15 DIAGNOSIS — D6481 Anemia due to antineoplastic chemotherapy: Secondary | ICD-10-CM | POA: Diagnosis not present

## 2019-01-15 DIAGNOSIS — T451X5A Adverse effect of antineoplastic and immunosuppressive drugs, initial encounter: Secondary | ICD-10-CM | POA: Diagnosis not present

## 2019-01-15 DIAGNOSIS — R7989 Other specified abnormal findings of blood chemistry: Secondary | ICD-10-CM | POA: Diagnosis not present

## 2019-01-15 DIAGNOSIS — C250 Malignant neoplasm of head of pancreas: Secondary | ICD-10-CM | POA: Diagnosis not present

## 2019-01-18 DIAGNOSIS — D539 Nutritional anemia, unspecified: Secondary | ICD-10-CM | POA: Diagnosis not present

## 2019-01-18 DIAGNOSIS — R531 Weakness: Secondary | ICD-10-CM | POA: Diagnosis not present

## 2019-01-18 DIAGNOSIS — E440 Moderate protein-calorie malnutrition: Secondary | ICD-10-CM | POA: Diagnosis not present

## 2019-01-18 DIAGNOSIS — D72829 Elevated white blood cell count, unspecified: Secondary | ICD-10-CM | POA: Diagnosis not present

## 2019-01-18 DIAGNOSIS — I428 Other cardiomyopathies: Secondary | ICD-10-CM | POA: Diagnosis not present

## 2019-01-18 DIAGNOSIS — C259 Malignant neoplasm of pancreas, unspecified: Secondary | ICD-10-CM | POA: Diagnosis not present

## 2019-01-18 DIAGNOSIS — N390 Urinary tract infection, site not specified: Secondary | ICD-10-CM | POA: Diagnosis not present

## 2019-01-18 DIAGNOSIS — E872 Acidosis: Secondary | ICD-10-CM | POA: Diagnosis not present

## 2019-01-18 DIAGNOSIS — I499 Cardiac arrhythmia, unspecified: Secondary | ICD-10-CM | POA: Diagnosis not present

## 2019-01-18 DIAGNOSIS — R0902 Hypoxemia: Secondary | ICD-10-CM | POA: Diagnosis not present

## 2019-01-18 DIAGNOSIS — R000 Tachycardia, unspecified: Secondary | ICD-10-CM | POA: Diagnosis not present

## 2019-01-18 DIAGNOSIS — D649 Anemia, unspecified: Secondary | ICD-10-CM | POA: Diagnosis not present

## 2019-01-18 DIAGNOSIS — I491 Atrial premature depolarization: Secondary | ICD-10-CM | POA: Diagnosis not present

## 2019-01-18 DIAGNOSIS — N170 Acute kidney failure with tubular necrosis: Secondary | ICD-10-CM | POA: Diagnosis not present

## 2019-01-18 DIAGNOSIS — N179 Acute kidney failure, unspecified: Secondary | ICD-10-CM | POA: Diagnosis not present

## 2019-01-18 DIAGNOSIS — I4891 Unspecified atrial fibrillation: Secondary | ICD-10-CM | POA: Diagnosis not present

## 2019-01-19 DIAGNOSIS — I129 Hypertensive chronic kidney disease with stage 1 through stage 4 chronic kidney disease, or unspecified chronic kidney disease: Secondary | ICD-10-CM | POA: Diagnosis present

## 2019-01-19 DIAGNOSIS — I428 Other cardiomyopathies: Secondary | ICD-10-CM | POA: Diagnosis present

## 2019-01-19 DIAGNOSIS — E440 Moderate protein-calorie malnutrition: Secondary | ICD-10-CM | POA: Diagnosis present

## 2019-01-19 DIAGNOSIS — Z794 Long term (current) use of insulin: Secondary | ICD-10-CM | POA: Diagnosis not present

## 2019-01-19 DIAGNOSIS — Z9221 Personal history of antineoplastic chemotherapy: Secondary | ICD-10-CM | POA: Diagnosis not present

## 2019-01-19 DIAGNOSIS — D649 Anemia, unspecified: Secondary | ICD-10-CM | POA: Diagnosis not present

## 2019-01-19 DIAGNOSIS — R000 Tachycardia, unspecified: Secondary | ICD-10-CM | POA: Diagnosis not present

## 2019-01-19 DIAGNOSIS — E876 Hypokalemia: Secondary | ICD-10-CM | POA: Diagnosis present

## 2019-01-19 DIAGNOSIS — R933 Abnormal findings on diagnostic imaging of other parts of digestive tract: Secondary | ICD-10-CM | POA: Diagnosis not present

## 2019-01-19 DIAGNOSIS — E1122 Type 2 diabetes mellitus with diabetic chronic kidney disease: Secondary | ICD-10-CM | POA: Diagnosis present

## 2019-01-19 DIAGNOSIS — D539 Nutritional anemia, unspecified: Secondary | ICD-10-CM | POA: Diagnosis present

## 2019-01-19 DIAGNOSIS — N1330 Unspecified hydronephrosis: Secondary | ICD-10-CM | POA: Diagnosis not present

## 2019-01-19 DIAGNOSIS — R9431 Abnormal electrocardiogram [ECG] [EKG]: Secondary | ICD-10-CM | POA: Diagnosis not present

## 2019-01-19 DIAGNOSIS — K529 Noninfective gastroenteritis and colitis, unspecified: Secondary | ICD-10-CM | POA: Diagnosis present

## 2019-01-19 DIAGNOSIS — F17210 Nicotine dependence, cigarettes, uncomplicated: Secondary | ICD-10-CM | POA: Diagnosis present

## 2019-01-19 DIAGNOSIS — N170 Acute kidney failure with tubular necrosis: Secondary | ICD-10-CM | POA: Diagnosis present

## 2019-01-19 DIAGNOSIS — E869 Volume depletion, unspecified: Secondary | ICD-10-CM | POA: Diagnosis present

## 2019-01-19 DIAGNOSIS — N183 Chronic kidney disease, stage 3 (moderate): Secondary | ICD-10-CM | POA: Diagnosis present

## 2019-01-19 DIAGNOSIS — N179 Acute kidney failure, unspecified: Secondary | ICD-10-CM | POA: Diagnosis not present

## 2019-01-19 DIAGNOSIS — E1140 Type 2 diabetes mellitus with diabetic neuropathy, unspecified: Secondary | ICD-10-CM | POA: Diagnosis present

## 2019-01-19 DIAGNOSIS — E872 Acidosis: Secondary | ICD-10-CM | POA: Diagnosis present

## 2019-01-19 DIAGNOSIS — K922 Gastrointestinal hemorrhage, unspecified: Secondary | ICD-10-CM | POA: Diagnosis present

## 2019-01-19 DIAGNOSIS — N390 Urinary tract infection, site not specified: Secondary | ICD-10-CM | POA: Diagnosis not present

## 2019-01-19 DIAGNOSIS — E785 Hyperlipidemia, unspecified: Secondary | ICD-10-CM | POA: Diagnosis present

## 2019-01-19 DIAGNOSIS — R0602 Shortness of breath: Secondary | ICD-10-CM | POA: Diagnosis not present

## 2019-01-19 DIAGNOSIS — I4891 Unspecified atrial fibrillation: Secondary | ICD-10-CM | POA: Diagnosis present

## 2019-01-19 DIAGNOSIS — J449 Chronic obstructive pulmonary disease, unspecified: Secondary | ICD-10-CM | POA: Diagnosis present

## 2019-01-19 DIAGNOSIS — N132 Hydronephrosis with renal and ureteral calculous obstruction: Secondary | ICD-10-CM | POA: Diagnosis not present

## 2019-01-19 DIAGNOSIS — Z8673 Personal history of transient ischemic attack (TIA), and cerebral infarction without residual deficits: Secondary | ICD-10-CM | POA: Diagnosis not present

## 2019-01-19 DIAGNOSIS — Z7982 Long term (current) use of aspirin: Secondary | ICD-10-CM | POA: Diagnosis not present

## 2019-01-19 DIAGNOSIS — Z6821 Body mass index (BMI) 21.0-21.9, adult: Secondary | ICD-10-CM | POA: Diagnosis not present

## 2019-01-19 DIAGNOSIS — D72829 Elevated white blood cell count, unspecified: Secondary | ICD-10-CM | POA: Diagnosis not present

## 2019-01-19 DIAGNOSIS — I4892 Unspecified atrial flutter: Secondary | ICD-10-CM | POA: Diagnosis not present

## 2019-01-19 DIAGNOSIS — C259 Malignant neoplasm of pancreas, unspecified: Secondary | ICD-10-CM | POA: Diagnosis present

## 2019-01-20 ENCOUNTER — Telehealth: Payer: Self-pay | Admitting: Cardiology

## 2019-01-20 NOTE — Telephone Encounter (Signed)
home phone/ consent/ my chart/ pre reg completed °

## 2019-01-26 NOTE — Progress Notes (Signed)
Virtual Visit via Video Note changed to phone visit at patient request.   This visit type was conducted due to national recommendations for restrictions regarding the COVID-19 Pandemic (e.g. social distancing) in an effort to limit this patient's exposure and mitigate transmission in our community.  Due to his co-morbid illnesses, this patient is at least at moderate risk for complications without adequate follow up.  This format is felt to be most appropriate for this patient at this time.  All issues noted in this document were discussed and addressed.  A limited physical exam was performed with this format.  Please refer to the patient's chart for his consent to telehealth for [facility].   Date:  01/28/2019   ID:  [name] [name], DOB November 25, 1950, MRN [id number]  Patient Location:Home Provider Location: Home  PCP:  Janith Lima, MD  Cardiologist:  Dr Stanford Breed  Evaluation Performed:  Follow-Up Visit  Chief Complaint:  FU atrial fibrillation  History of Present Illness:    FU atrial fibrillation. Patient was admitted in March 2014 with encephalopathy and hyperosmolar nonketotic state. Patient developed atrial fibrillation which was treated with Cardizem. He converted to sinus rhythm. Seen November 2018 by Melvyn Neth and found to be in atrial fibrillation.  He refused anticoagulation at that time.  Echocardiogram December 2019 showed ejection fraction 50 to 55%, severe asymmetric hypertrophy of the septum, grade 2 diastolic dysfunction, mild mitral regurgitation, moderate to severe left atrial enlargement and mild tricuspid regurgitation.  No LVOT gradient noted.  Pattern consistent with hypertrophic cardiomyopathy.   Abdominal ultrasound December 2019 showed no aneurysm.  There was dilatation of the right and left common iliacs of 1.6 and 1.7 cm.  Gallbladder enlarged.  Patient now being treated for pancreatic cancer.  Since last seen,  he has had some difficulties with diarrhea,  decreased p.o. intake related to chemotherapy.  His metoprolol was discontinued because of low blood pressure.  The patient does not have symptoms concerning for COVID-19 infection (fever, chills, cough, or new shortness of breath).    Past Medical History:  Diagnosis Date  . Allergy   . Asthma   . Atrial fibrillation ([place])    a. initially occuring in 2014, has declined anticoagulation since having a GI bleed with initiation of Xarelto  . Cancer ([place])   . Cardiomyopathy 2011   a. Patient was told that he had an ejection fraction of 37% by echo done elsewhere in 2011 b. 06/2014: echo showing preserved EF of 55-60%, severe asymetric septal hypertrophy with no SAM or LVOT gradient  . COPD (chronic obstructive pulmonary disease) ([place])   . Diabetes mellitus without complication ([place])   . Diverticulitis   . GERD (gastroesophageal reflux disease)   . History of colon polyps   . Hypertension   . Tobacco abuse    Past Surgical History:  Procedure Laterality Date  . BILIARY STENT PLACEMENT  07/30/2018   Procedure: BILIARY STENT PLACEMENT;  Surgeon: Ladene Artist, MD;  Location: [facility] ENDOSCOPY;  Service: Endoscopy;;  . BILIARY STENT PLACEMENT N/A 08/13/2018   Procedure: BILIARY STENT PLACEMENT;  Surgeon: Milus Banister, MD;  Location: [facility] ENDOSCOPY;  Service: Endoscopy;  Laterality: N/A;  . COLON SURGERY  2009  . COLONOSCOPY    . ERCP    . ERCP N/A 07/30/2018   Procedure: ENDOSCOPIC RETROGRADE CHOLANGIOPANCREATOGRAPHY (ERCP);  Surgeon: Ladene Artist, MD;  Location: [facility] ENDOSCOPY;  Service: Endoscopy;  Laterality: N/A;  . ERCP N/A 08/13/2018   Procedure: ENDOSCOPIC RETROGRADE  CHOLANGIOPANCREATOGRAPHY (ERCP);  Surgeon: Milus Banister, MD;  Location: [facility] ENDOSCOPY;  Service: Endoscopy;  Laterality: N/A;  . ESOPHAGOGASTRODUODENOSCOPY N/A 08/13/2018   Procedure: ESOPHAGOGASTRODUODENOSCOPY (EGD);  Surgeon: Milus Banister, MD;  Location: [facility] ENDOSCOPY;  Service: Endoscopy;  Laterality: N/A;  .  ESOPHAGOGASTRODUODENOSCOPY (EGD) WITH PROPOFOL N/A 10/08/2018   Procedure: ESOPHAGOGASTRODUODENOSCOPY (EGD) WITH PROPOFOL;  Surgeon: Milus Banister, MD;  Location: [facility] ENDOSCOPY;  Service: Endoscopy;  Laterality: N/A;  . EUS N/A 08/13/2018   Procedure: UPPER ENDOSCOPIC ULTRASOUND (EUS) RADIAL;  Surgeon: Milus Banister, MD;  Location: [facility] ENDOSCOPY;  Service: Endoscopy;  Laterality: N/A;  . EUS N/A 10/08/2018   Procedure: UPPER ENDOSCOPIC ULTRASOUND (EUS) RADIAL;  Surgeon: Milus Banister, MD;  Location: [facility] ENDOSCOPY;  Service: Endoscopy;  Laterality: N/A;  . FINE NEEDLE ASPIRATION N/A 08/13/2018   Procedure: FINE NEEDLE ASPIRATION (FNA) LINEAR;  Surgeon: Milus Banister, MD;  Location: [facility] ENDOSCOPY;  Service: Endoscopy;  Laterality: N/A;  . FINE NEEDLE ASPIRATION  10/08/2018   Procedure: FINE NEEDLE ASPIRATION;  Surgeon: Milus Banister, MD;  Location: [facility] ENDOSCOPY;  Service: Endoscopy;;  . IR IMAGING GUIDED PORT INSERTION  11/04/2018  . STENT REMOVAL  08/13/2018   Procedure: STENT REMOVAL;  Surgeon: Milus Banister, MD;  Location: [facility] ENDOSCOPY;  Service: Endoscopy;;     Current Meds  Medication Sig  . albuterol (PROVENTIL HFA;VENTOLIN HFA) 108 (90 Base) MCG/ACT inhaler Inhale 2 puffs into the lungs every 6 (six) hours as needed for wheezing.  . Amino Acids-Protein Hydrolys (FEEDING SUPPLEMENT, PRO-STAT SUGAR FREE 64,) LIQD Take 30 mLs by mouth 2 (two) times daily.  Marland Kitchen apixaban (ELIQUIS) 5 MG TABS tablet Take 1 tablet (5 mg total) by mouth 2 (two) times daily.  . folic acid (FOLVITE) 1 MG tablet Take 1 tablet (1 mg total) by mouth daily.  Marland Kitchen glucose blood (ONETOUCH VERIO) test strip USE AS INSTRUCTED THREE TIMES DAILY. DX CODE E11.21  . Insulin Syringe-Needle U-100 (EASY TOUCH INSULIN SYRINGE) 31G X 5/16" 0.3 ML MISC USE AS DIRECTED 4 TIMES DAILY  . lactose free nutrition (BOOST PLUS) LIQD Take 237 mLs by mouth 3 (three) times daily with meals.  . lipase/protease/amylase (CREON) 36000 UNITS CPEP  capsule Take 1 capsule (36,000 Units total) by mouth 3 (three) times daily before meals.  . Multiple Vitamin (MULTIVITAMIN WITH MINERALS) TABS tablet Take 1 tablet by mouth daily.  Marland Kitchen omeprazole (PRILOSEC) 20 MG capsule Take 1 capsule (20 mg total) by mouth daily.  . potassium chloride SA (K-DUR) 20 MEQ tablet Take 1 tablet (20 mEq total) by mouth daily.  . prochlorperazine (COMPAZINE) 10 MG tablet Take 1 tablet (10 mg total) by mouth every 6 (six) hours as needed for nausea or vomiting.  . tamsulosin (FLOMAX) 0.4 MG CAPS capsule Take 0.4 mg by mouth at bedtime.  . thiamine (VITAMIN B-1) 100 MG tablet Take 1 tablet (100 mg total) by mouth daily.     Allergies:   Hydralazine, Lisinopril, Penicillins, and Tribenzor [olmesartan-amlodipine-hctz]   Social History   Tobacco Use  . Smoking status: Former Smoker    Types: Cigarettes    Quit date: 07/30/2011    Years since quitting: 7.5  . Smokeless tobacco: Never Used  . Tobacco comment: Smoked since age 20  Substance Use Topics  . Alcohol use: Yes    Alcohol/week: 1.0 standard drinks    Types: 1 Standard drinks or equivalent per week  . Drug use: No     Family Hx: The  patient's family history includes Arthritis in his father and mother; Atrial fibrillation in his mother; Cancer in an other family member; Colon cancer in his father; Diabetes in his father; Diabetes type II in his mother; Hypertension in his mother and another family member; Prostate cancer in his brother and father. There is no history of Esophageal cancer, Stomach cancer, or Rectal cancer.  ROS:   Please see the history of present illness.    No Fever, chills  or productive cough All other systems reviewed and are negative.  Recent Labs: 08/19/2018: TSH 1.93 12/26/2018: ALT 11 12/27/2018: Magnesium 2.3; Platelets 319 12/29/2018: BUN 20; Creatinine, Ser 2.12; Hemoglobin 7.3; Potassium 3.7; Sodium 139   Recent Lipid Panel Lab Results  Component Value Date/Time   CHOL 178  02/23/2018 11:18 AM   TRIG 108.0 02/23/2018 11:18 AM   HDL 48.00 02/23/2018 11:18 AM   CHOLHDL 4 02/23/2018 11:18 AM   LDLCALC 108 (H) 02/23/2018 11:18 AM   LDLDIRECT 92.0 04/03/2017 08:41 AM    Wt Readings from Last 3 Encounters:  12/28/18 138 lb 0.1 oz (62.6 kg)  12/25/18 129 lb 4.8 oz (58.7 kg)  12/11/18 135 lb 8 oz (61.5 kg)     Objective:    Vital Signs:  There were no vitals taken for this visit.   VITAL SIGNS:  reviewed NAD Answers questions appropriately Normal affect Remainder of physical examination not performed (telehealth visit; coronavirus pandemic)  ASSESSMENT & PLAN:    1. Paroxysmal atrial fibrillation-by history patient remains in sinus rhythm.  We will continue apixaban.  If chemotherapy causes significant thrombocytopenia may need to consider discontinuing.  Most recent platelet count however is stable.  Metoprolol was discontinued because of hypotension by patient's report.  We will follow. 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 3. Hypertension-patient's blood pressure is controlled off of medications by his report.  Follow. 4. Hyperlipidemia-continue statin. 5. Hypertrophic cardiomyopathy-we have previously attempted cardiac MRI but patient could not tolerate due to claustrophobia.  He was treated with Valium with no improvement.   6. Chronic stage IV kidney disease-monitored by nephrology. 7. Pancreatic cancer-Per oncology.  COVID-19 Education: The importance of social distancing was discussed today.  Time:   Today, I have spent 18 minutes with the patient with telehealth technology discussing the above problems.     Medication Adjustments/Labs and Tests Ordered: Current medicines are reviewed at length with the patient today.  Concerns regarding medicines are outlined above.   Tests Ordered: No orders of the defined types were placed in this encounter.   Medication Changes: No orders of the defined types  were placed in this encounter.   Follow Up:  Virtual Visit or In Person in 6 month(s)  Signed, Kirk Ruths, MD  01/28/2019 8:00 AM    [place]

## 2019-01-27 DIAGNOSIS — D72829 Elevated white blood cell count, unspecified: Secondary | ICD-10-CM | POA: Diagnosis not present

## 2019-01-27 DIAGNOSIS — D630 Anemia in neoplastic disease: Secondary | ICD-10-CM | POA: Diagnosis not present

## 2019-01-27 DIAGNOSIS — F17210 Nicotine dependence, cigarettes, uncomplicated: Secondary | ICD-10-CM | POA: Diagnosis not present

## 2019-01-27 DIAGNOSIS — N136 Pyonephrosis: Secondary | ICD-10-CM | POA: Diagnosis not present

## 2019-01-27 DIAGNOSIS — D631 Anemia in chronic kidney disease: Secondary | ICD-10-CM | POA: Diagnosis not present

## 2019-01-27 DIAGNOSIS — I429 Cardiomyopathy, unspecified: Secondary | ICD-10-CM | POA: Diagnosis not present

## 2019-01-27 DIAGNOSIS — I4891 Unspecified atrial fibrillation: Secondary | ICD-10-CM | POA: Diagnosis not present

## 2019-01-27 DIAGNOSIS — I1310 Hypertensive heart and chronic kidney disease without heart failure, with stage 1 through stage 4 chronic kidney disease, or unspecified chronic kidney disease: Secondary | ICD-10-CM | POA: Diagnosis not present

## 2019-01-27 DIAGNOSIS — Z794 Long term (current) use of insulin: Secondary | ICD-10-CM | POA: Diagnosis not present

## 2019-01-27 DIAGNOSIS — K5790 Diverticulosis of intestine, part unspecified, without perforation or abscess without bleeding: Secondary | ICD-10-CM | POA: Diagnosis not present

## 2019-01-27 DIAGNOSIS — K922 Gastrointestinal hemorrhage, unspecified: Secondary | ICD-10-CM | POA: Diagnosis not present

## 2019-01-27 DIAGNOSIS — E1140 Type 2 diabetes mellitus with diabetic neuropathy, unspecified: Secondary | ICD-10-CM | POA: Diagnosis not present

## 2019-01-27 DIAGNOSIS — Z9089 Acquired absence of other organs: Secondary | ICD-10-CM | POA: Diagnosis not present

## 2019-01-27 DIAGNOSIS — C259 Malignant neoplasm of pancreas, unspecified: Secondary | ICD-10-CM | POA: Diagnosis not present

## 2019-01-27 DIAGNOSIS — J90 Pleural effusion, not elsewhere classified: Secondary | ICD-10-CM | POA: Diagnosis not present

## 2019-01-27 DIAGNOSIS — D6481 Anemia due to antineoplastic chemotherapy: Secondary | ICD-10-CM | POA: Diagnosis not present

## 2019-01-27 DIAGNOSIS — N183 Chronic kidney disease, stage 3 (moderate): Secondary | ICD-10-CM | POA: Diagnosis not present

## 2019-01-27 DIAGNOSIS — Z8673 Personal history of transient ischemic attack (TIA), and cerebral infarction without residual deficits: Secondary | ICD-10-CM | POA: Diagnosis not present

## 2019-01-27 DIAGNOSIS — N170 Acute kidney failure with tubular necrosis: Secondary | ICD-10-CM | POA: Diagnosis not present

## 2019-01-27 DIAGNOSIS — R17 Unspecified jaundice: Secondary | ICD-10-CM | POA: Diagnosis not present

## 2019-01-27 DIAGNOSIS — E785 Hyperlipidemia, unspecified: Secondary | ICD-10-CM | POA: Diagnosis not present

## 2019-01-27 DIAGNOSIS — Z9181 History of falling: Secondary | ICD-10-CM | POA: Diagnosis not present

## 2019-01-27 DIAGNOSIS — Z7982 Long term (current) use of aspirin: Secondary | ICD-10-CM | POA: Diagnosis not present

## 2019-01-27 DIAGNOSIS — J449 Chronic obstructive pulmonary disease, unspecified: Secondary | ICD-10-CM | POA: Diagnosis not present

## 2019-01-27 DIAGNOSIS — E1122 Type 2 diabetes mellitus with diabetic chronic kidney disease: Secondary | ICD-10-CM | POA: Diagnosis not present

## 2019-01-27 NOTE — Telephone Encounter (Signed)
O called pt to confirm his appt with Dr Stanford Breed on 01-28-19.

## 2019-01-28 ENCOUNTER — Telehealth (INDEPENDENT_AMBULATORY_CARE_PROVIDER_SITE_OTHER): Payer: Medicare Other | Admitting: Cardiology

## 2019-01-28 DIAGNOSIS — I480 Paroxysmal atrial fibrillation: Secondary | ICD-10-CM | POA: Diagnosis not present

## 2019-01-28 DIAGNOSIS — I422 Other hypertrophic cardiomyopathy: Secondary | ICD-10-CM

## 2019-01-28 DIAGNOSIS — I10 Essential (primary) hypertension: Secondary | ICD-10-CM

## 2019-01-28 DIAGNOSIS — I723 Aneurysm of iliac artery: Secondary | ICD-10-CM

## 2019-01-28 NOTE — Patient Instructions (Signed)

## 2019-02-02 DIAGNOSIS — N170 Acute kidney failure with tubular necrosis: Secondary | ICD-10-CM | POA: Diagnosis not present

## 2019-02-02 DIAGNOSIS — D630 Anemia in neoplastic disease: Secondary | ICD-10-CM | POA: Diagnosis not present

## 2019-02-02 DIAGNOSIS — N136 Pyonephrosis: Secondary | ICD-10-CM | POA: Diagnosis not present

## 2019-02-02 DIAGNOSIS — D6481 Anemia due to antineoplastic chemotherapy: Secondary | ICD-10-CM | POA: Diagnosis not present

## 2019-02-02 DIAGNOSIS — K922 Gastrointestinal hemorrhage, unspecified: Secondary | ICD-10-CM | POA: Diagnosis not present

## 2019-02-02 DIAGNOSIS — C259 Malignant neoplasm of pancreas, unspecified: Secondary | ICD-10-CM | POA: Diagnosis not present

## 2019-02-05 DIAGNOSIS — N136 Pyonephrosis: Secondary | ICD-10-CM | POA: Diagnosis not present

## 2019-02-05 DIAGNOSIS — K922 Gastrointestinal hemorrhage, unspecified: Secondary | ICD-10-CM | POA: Diagnosis not present

## 2019-02-05 DIAGNOSIS — D6481 Anemia due to antineoplastic chemotherapy: Secondary | ICD-10-CM | POA: Diagnosis not present

## 2019-02-05 DIAGNOSIS — N170 Acute kidney failure with tubular necrosis: Secondary | ICD-10-CM | POA: Diagnosis not present

## 2019-02-05 DIAGNOSIS — C259 Malignant neoplasm of pancreas, unspecified: Secondary | ICD-10-CM | POA: Diagnosis not present

## 2019-02-05 DIAGNOSIS — D630 Anemia in neoplastic disease: Secondary | ICD-10-CM | POA: Diagnosis not present

## 2019-02-06 DIAGNOSIS — C259 Malignant neoplasm of pancreas, unspecified: Secondary | ICD-10-CM | POA: Diagnosis not present

## 2019-02-09 DIAGNOSIS — C259 Malignant neoplasm of pancreas, unspecified: Secondary | ICD-10-CM | POA: Diagnosis not present

## 2019-02-09 DIAGNOSIS — C250 Malignant neoplasm of head of pancreas: Secondary | ICD-10-CM | POA: Diagnosis not present

## 2019-02-12 DIAGNOSIS — D6481 Anemia due to antineoplastic chemotherapy: Secondary | ICD-10-CM | POA: Diagnosis not present

## 2019-02-12 DIAGNOSIS — D630 Anemia in neoplastic disease: Secondary | ICD-10-CM | POA: Diagnosis not present

## 2019-02-12 DIAGNOSIS — N136 Pyonephrosis: Secondary | ICD-10-CM | POA: Diagnosis not present

## 2019-02-12 DIAGNOSIS — K922 Gastrointestinal hemorrhage, unspecified: Secondary | ICD-10-CM | POA: Diagnosis not present

## 2019-02-12 DIAGNOSIS — C259 Malignant neoplasm of pancreas, unspecified: Secondary | ICD-10-CM | POA: Diagnosis not present

## 2019-02-12 DIAGNOSIS — N170 Acute kidney failure with tubular necrosis: Secondary | ICD-10-CM | POA: Diagnosis not present

## 2019-02-17 DIAGNOSIS — R680 Hypothermia, not associated with low environmental temperature: Secondary | ICD-10-CM | POA: Diagnosis present

## 2019-02-17 DIAGNOSIS — R64 Cachexia: Secondary | ICD-10-CM | POA: Diagnosis not present

## 2019-02-17 DIAGNOSIS — E876 Hypokalemia: Secondary | ICD-10-CM | POA: Diagnosis not present

## 2019-02-17 DIAGNOSIS — Z8673 Personal history of transient ischemic attack (TIA), and cerebral infarction without residual deficits: Secondary | ICD-10-CM | POA: Diagnosis not present

## 2019-02-17 DIAGNOSIS — E43 Unspecified severe protein-calorie malnutrition: Secondary | ICD-10-CM | POA: Diagnosis present

## 2019-02-17 DIAGNOSIS — E860 Dehydration: Secondary | ICD-10-CM | POA: Diagnosis present

## 2019-02-17 DIAGNOSIS — E872 Acidosis: Secondary | ICD-10-CM | POA: Diagnosis present

## 2019-02-17 DIAGNOSIS — Z681 Body mass index (BMI) 19 or less, adult: Secondary | ICD-10-CM | POA: Diagnosis not present

## 2019-02-17 DIAGNOSIS — R402413 Glasgow coma scale score 13-15, at hospital admission: Secondary | ICD-10-CM | POA: Diagnosis present

## 2019-02-17 DIAGNOSIS — E1122 Type 2 diabetes mellitus with diabetic chronic kidney disease: Secondary | ICD-10-CM | POA: Diagnosis present

## 2019-02-17 DIAGNOSIS — N132 Hydronephrosis with renal and ureteral calculous obstruction: Secondary | ICD-10-CM | POA: Diagnosis not present

## 2019-02-17 DIAGNOSIS — I10 Essential (primary) hypertension: Secondary | ICD-10-CM | POA: Diagnosis not present

## 2019-02-17 DIAGNOSIS — K591 Functional diarrhea: Secondary | ICD-10-CM | POA: Diagnosis not present

## 2019-02-17 DIAGNOSIS — N183 Chronic kidney disease, stage 3 (moderate): Secondary | ICD-10-CM | POA: Diagnosis present

## 2019-02-17 DIAGNOSIS — I4891 Unspecified atrial fibrillation: Secondary | ICD-10-CM | POA: Diagnosis present

## 2019-02-17 DIAGNOSIS — J449 Chronic obstructive pulmonary disease, unspecified: Secondary | ICD-10-CM | POA: Diagnosis present

## 2019-02-17 DIAGNOSIS — Z602 Problems related to living alone: Secondary | ICD-10-CM | POA: Diagnosis present

## 2019-02-17 DIAGNOSIS — R0902 Hypoxemia: Secondary | ICD-10-CM | POA: Diagnosis not present

## 2019-02-17 DIAGNOSIS — R404 Transient alteration of awareness: Secondary | ICD-10-CM | POA: Diagnosis not present

## 2019-02-17 DIAGNOSIS — I429 Cardiomyopathy, unspecified: Secondary | ICD-10-CM | POA: Diagnosis present

## 2019-02-17 DIAGNOSIS — N179 Acute kidney failure, unspecified: Secondary | ICD-10-CM | POA: Diagnosis not present

## 2019-02-17 DIAGNOSIS — Z95828 Presence of other vascular implants and grafts: Secondary | ICD-10-CM | POA: Diagnosis not present

## 2019-02-17 DIAGNOSIS — E871 Hypo-osmolality and hyponatremia: Secondary | ICD-10-CM | POA: Diagnosis not present

## 2019-02-17 DIAGNOSIS — K922 Gastrointestinal hemorrhage, unspecified: Secondary | ICD-10-CM | POA: Diagnosis not present

## 2019-02-17 DIAGNOSIS — I63511 Cerebral infarction due to unspecified occlusion or stenosis of right middle cerebral artery: Secondary | ICD-10-CM | POA: Diagnosis not present

## 2019-02-17 DIAGNOSIS — R4182 Altered mental status, unspecified: Secondary | ICD-10-CM | POA: Diagnosis not present

## 2019-02-17 DIAGNOSIS — C250 Malignant neoplasm of head of pancreas: Secondary | ICD-10-CM | POA: Diagnosis not present

## 2019-02-17 DIAGNOSIS — R338 Other retention of urine: Secondary | ICD-10-CM | POA: Diagnosis present

## 2019-02-17 DIAGNOSIS — R531 Weakness: Secondary | ICD-10-CM | POA: Diagnosis not present

## 2019-02-17 DIAGNOSIS — J90 Pleural effusion, not elsewhere classified: Secondary | ICD-10-CM | POA: Diagnosis not present

## 2019-02-17 DIAGNOSIS — R627 Adult failure to thrive: Secondary | ICD-10-CM | POA: Diagnosis not present

## 2019-02-17 DIAGNOSIS — I129 Hypertensive chronic kidney disease with stage 1 through stage 4 chronic kidney disease, or unspecified chronic kidney disease: Secondary | ICD-10-CM | POA: Diagnosis present

## 2019-02-17 DIAGNOSIS — K5730 Diverticulosis of large intestine without perforation or abscess without bleeding: Secondary | ICD-10-CM | POA: Diagnosis not present

## 2019-02-17 DIAGNOSIS — E1165 Type 2 diabetes mellitus with hyperglycemia: Secondary | ICD-10-CM | POA: Diagnosis not present

## 2019-02-17 DIAGNOSIS — Z9221 Personal history of antineoplastic chemotherapy: Secondary | ICD-10-CM | POA: Diagnosis not present

## 2019-02-17 DIAGNOSIS — I639 Cerebral infarction, unspecified: Secondary | ICD-10-CM | POA: Diagnosis not present

## 2019-02-17 DIAGNOSIS — Z9689 Presence of other specified functional implants: Secondary | ICD-10-CM | POA: Diagnosis not present

## 2019-02-17 DIAGNOSIS — C771 Secondary and unspecified malignant neoplasm of intrathoracic lymph nodes: Secondary | ICD-10-CM | POA: Diagnosis not present

## 2019-02-17 DIAGNOSIS — Z515 Encounter for palliative care: Secondary | ICD-10-CM | POA: Diagnosis present

## 2019-02-17 DIAGNOSIS — N390 Urinary tract infection, site not specified: Secondary | ICD-10-CM | POA: Diagnosis not present

## 2019-02-17 DIAGNOSIS — F17210 Nicotine dependence, cigarettes, uncomplicated: Secondary | ICD-10-CM | POA: Diagnosis present

## 2019-02-17 DIAGNOSIS — M542 Cervicalgia: Secondary | ICD-10-CM | POA: Diagnosis not present

## 2019-02-17 DIAGNOSIS — R630 Anorexia: Secondary | ICD-10-CM | POA: Diagnosis not present

## 2019-02-17 DIAGNOSIS — R197 Diarrhea, unspecified: Secondary | ICD-10-CM | POA: Diagnosis present

## 2019-02-17 DIAGNOSIS — C259 Malignant neoplasm of pancreas, unspecified: Secondary | ICD-10-CM | POA: Diagnosis not present

## 2019-02-17 DIAGNOSIS — I959 Hypotension, unspecified: Secondary | ICD-10-CM | POA: Diagnosis not present

## 2019-02-17 DIAGNOSIS — N401 Enlarged prostate with lower urinary tract symptoms: Secondary | ICD-10-CM | POA: Diagnosis present

## 2019-02-17 DIAGNOSIS — Z87440 Personal history of urinary (tract) infections: Secondary | ICD-10-CM | POA: Diagnosis not present

## 2019-02-17 DIAGNOSIS — D638 Anemia in other chronic diseases classified elsewhere: Secondary | ICD-10-CM | POA: Diagnosis not present

## 2019-02-20 DIAGNOSIS — E872 Acidosis: Secondary | ICD-10-CM | POA: Diagnosis not present

## 2019-02-20 DIAGNOSIS — E876 Hypokalemia: Secondary | ICD-10-CM | POA: Diagnosis not present

## 2019-02-20 DIAGNOSIS — R531 Weakness: Secondary | ICD-10-CM | POA: Diagnosis not present

## 2019-02-22 DIAGNOSIS — N179 Acute kidney failure, unspecified: Secondary | ICD-10-CM | POA: Diagnosis not present

## 2019-03-05 ENCOUNTER — Ambulatory Visit: Payer: Medicare Other

## 2019-03-11 NOTE — Progress Notes (Deleted)
Subjective:   [name] [name] is a 68 y.o. male who presents for Medicare Annual/Subsequent preventive examination.  Review of Systems:     Sleep patterns: {SX; SLEEP PATTERNS:18802::"feels rested on waking","does not get up to void","gets up *** times nightly to void","sleeps *** hours nightly"}.    Home Safety/Smoke Alarms: Feels safe in home. Smoke alarms in place.  Living environment; residence and Firearm Safety: {Rehab home environment / accessibility:30080::"no firearms","firearms stored safely"}. Seat Belt Safety/Bike Helmet: Wears seat belt.     Objective:    Vitals: There were no vitals taken for this visit.  There is no height or weight on file to calculate BMI.  Advanced Directives 12/25/2018 11/04/2018 10/19/2018 10/19/2018 08/13/2018 03/04/2018 03/22/2017  Does Patient Have a Medical Advance Directive? No Yes Yes No Yes Yes No  Type of Advance Directive - Healthcare Power of [place];Living will - Living will [place];Living will -  Does patient want to make changes to medical advance directive? - - No - Patient declined - - - -  Copy of [place] in Chart? - No - copy requested No - copy requested - - - -  Would patient like information on creating a medical advance directive? No - Patient declined No - Patient declined No - Patient declined No - Patient declined - - -  Pre-existing out of facility DNR order (yellow form or pink MOST form) - - - - - - -    Tobacco Social History   Tobacco Use  Smoking Status Former Smoker   Types: Cigarettes   Quit date: 07/30/2011   Years since quitting: 7.6  Smokeless Tobacco Never Used  Tobacco Comment   Smoked since age 32     Counseling given: Not Answered Comment: Smoked since age 55  Past Medical History:  Diagnosis Date   Allergy    Asthma    Atrial fibrillation ([place])    a. initially occuring in 2014, has declined anticoagulation since having a GI  bleed with initiation of Xarelto   Cancer ([place])    Cardiomyopathy 2011   a. Patient was told that he had an ejection fraction of 37% by echo done elsewhere in 2011 b. 06/2014: echo showing preserved EF of 55-60%, severe asymetric septal hypertrophy with no SAM or LVOT gradient   COPD (chronic obstructive pulmonary disease) ([place])    Diabetes mellitus without complication ([place])    Diverticulitis    GERD (gastroesophageal reflux disease)    History of colon polyps    Hypertension    Tobacco abuse    Past Surgical History:  Procedure Laterality Date   BILIARY STENT PLACEMENT  07/30/2018   Procedure: BILIARY STENT PLACEMENT;  Surgeon: Ladene Artist, MD;  Location: [facility] ENDOSCOPY;  Service: Endoscopy;;   BILIARY STENT PLACEMENT N/A 08/13/2018   Procedure: BILIARY STENT PLACEMENT;  Surgeon: Milus Banister, MD;  Location: [facility] ENDOSCOPY;  Service: Endoscopy;  Laterality: N/A;   COLON SURGERY  2009   COLONOSCOPY     ERCP     ERCP N/A 07/30/2018   Procedure: ENDOSCOPIC RETROGRADE CHOLANGIOPANCREATOGRAPHY (ERCP);  Surgeon: Ladene Artist, MD;  Location: [facility] ENDOSCOPY;  Service: Endoscopy;  Laterality: N/A;   ERCP N/A 08/13/2018   Procedure: ENDOSCOPIC RETROGRADE CHOLANGIOPANCREATOGRAPHY (ERCP);  Surgeon: Milus Banister, MD;  Location: [facility] ENDOSCOPY;  Service: Endoscopy;  Laterality: N/A;   ESOPHAGOGASTRODUODENOSCOPY N/A 08/13/2018   Procedure: ESOPHAGOGASTRODUODENOSCOPY (EGD);  Surgeon: Milus Banister, MD;  Location: [facility] ENDOSCOPY;  Service: Endoscopy;  Laterality: N/A;   ESOPHAGOGASTRODUODENOSCOPY (EGD) WITH PROPOFOL N/A 10/08/2018   Procedure: ESOPHAGOGASTRODUODENOSCOPY (EGD) WITH PROPOFOL;  Surgeon: Milus Banister, MD;  Location: [facility] ENDOSCOPY;  Service: Endoscopy;  Laterality: N/A;   EUS N/A 08/13/2018   Procedure: UPPER ENDOSCOPIC ULTRASOUND (EUS) RADIAL;  Surgeon: Milus Banister, MD;  Location: [facility] ENDOSCOPY;  Service: Endoscopy;  Laterality: N/A;   EUS N/A 10/08/2018    Procedure: UPPER ENDOSCOPIC ULTRASOUND (EUS) RADIAL;  Surgeon: Milus Banister, MD;  Location: [facility] ENDOSCOPY;  Service: Endoscopy;  Laterality: N/A;   FINE NEEDLE ASPIRATION N/A 08/13/2018   Procedure: FINE NEEDLE ASPIRATION (FNA) LINEAR;  Surgeon: Milus Banister, MD;  Location: [facility] ENDOSCOPY;  Service: Endoscopy;  Laterality: N/A;   FINE NEEDLE ASPIRATION  10/08/2018   Procedure: FINE NEEDLE ASPIRATION;  Surgeon: Milus Banister, MD;  Location: [facility] ENDOSCOPY;  Service: Endoscopy;;   IR IMAGING GUIDED PORT INSERTION  11/04/2018   STENT REMOVAL  08/13/2018   Procedure: STENT REMOVAL;  Surgeon: Milus Banister, MD;  Location: [facility] ENDOSCOPY;  Service: Endoscopy;;   Family History  Problem Relation Age of Onset   Prostate cancer Father    Colon cancer Father    Diabetes Father    Arthritis Father    Diabetes type II Mother    Atrial fibrillation Mother    Hypertension Mother    Arthritis Mother    Cancer Other        Colon,Breast, Prostate Cancer   Hypertension Other    Prostate cancer Brother        5 brothers - 2 were dx with prostate cancer   Esophageal cancer Neg Hx    Stomach cancer Neg Hx    Rectal cancer Neg Hx    Social History   Socioeconomic History   Marital status: Single    Spouse name: Not on file   Number of children: 2   Years of education: Not on file   Highest education level: Not on file  Occupational History   Not on file  Social Needs   Financial resource strain: Not hard at all   Food insecurity    Worry: Never true    Inability: Never true   Transportation needs    Medical: No    Non-medical: No  Tobacco Use   Smoking status: Former Smoker    Types: Cigarettes    Quit date: 07/30/2011    Years since quitting: 7.6   Smokeless tobacco: Never Used   Tobacco comment: Smoked since age 11  Substance and Sexual Activity   Alcohol use: Yes    Alcohol/week: 1.0 standard drinks    Types: 1 Standard drinks or equivalent per week     Drug use: No   Sexual activity: Not Currently  Lifestyle   Physical activity    Days per week: 3 days    Minutes per session: 50 min   Stress: Not at all  Relationships   Social connections    Talks on phone: More than three times a week    Gets together: More than three times a week    Attends religious service: More than 4 times per year    Active member of club or organization: Yes    Attends meetings of clubs or organizations: More than 4 times per year    Relationship status: Not on file  Other Topics Concern   Not on file  Social History Narrative   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   Amino Acids-Protein Hydrolys (FEEDING SUPPLEMENT, PRO-STAT SUGAR FREE 64,) LIQD Take 30 mLs by mouth 2 (two) times daily.   apixaban (ELIQUIS) 5 MG TABS tablet Take 1 tablet (5 mg total) by mouth 2 (two) times daily.   folic acid (FOLVITE) 1 MG tablet Take 1 tablet (1 mg total) by mouth daily.   glucose blood (ONETOUCH VERIO) test strip USE AS INSTRUCTED THREE TIMES DAILY. DX CODE E11.21   Insulin Syringe-Needle U-100 (EASY TOUCH INSULIN SYRINGE) 31G X 5/16" 0.3 ML MISC USE AS DIRECTED 4 TIMES DAILY   lactose free nutrition (BOOST PLUS) LIQD Take 237 mLs by mouth 3 (three) times daily with meals.   lipase/protease/amylase (CREON) 36000 UNITS CPEP capsule Take 1 capsule (36,000 Units total) by mouth 3 (three) times daily before meals.   Multiple Vitamin (MULTIVITAMIN WITH MINERALS) TABS tablet Take 1 tablet by mouth daily.   omeprazole (PRILOSEC) 20 MG capsule Take 1 capsule (20 mg total) by mouth  daily.   potassium chloride SA (K-DUR) 20 MEQ tablet Take 1 tablet (20 mEq total) by mouth daily.   prochlorperazine (COMPAZINE) 10 MG tablet Take 1 tablet (10 mg total) by mouth every 6 (six) hours as needed for nausea or vomiting.   tamsulosin (FLOMAX) 0.4 MG CAPS capsule Take 0.4 mg by mouth at bedtime.   thiamine (VITAMIN B-1) 100 MG tablet Take 1 tablet (100 mg total) by mouth daily.   No facility-administered encounter medications on file as of 03/12/2019.     Activities of Daily Living In your present state of health, do you have any difficulty performing the following activities: 12/25/2018 11/04/2018  Hearing? N N  Vision? N N  Difficulty concentrating or making decisions? N N  Walking or climbing stairs? N N  Dressing or bathing? N N  Doing errands, shopping? N -  Some recent data might be hidden    Patient Care Team: Janith Lima, MD as PCP - General (Internal Medicine) Stanford Breed Denice Bors, MD as PCP - Cardiology (Cardiology) Arna Snipe, RN as Oncology Nurse Navigator Milus Banister, MD as Attending Physician (Gastroenterology) Ladell Pier, MD as Consulting Physician (Oncology)   Assessment:   This is a routine wellness examination for [place]. Physical assessment deferred to PCP.  Exercise Activities and Dietary recommendations   Diet (meal preparation, eat out, water intake, caffeinated beverages, dairy products, fruits and vegetables): {Desc; diets:16563}   Goals     <enter goal here>     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  Enjoy life and family.     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 No No Yes No  Comment - - Emmi Telephone Survey: data to providers prior to load -  Number falls in past yr: - - 1 -  Comment - - Emmi Telephone Survey Actual Response = 1 -  Injury with Fall? - - No -    Depression Screen PHQ 2/9 Scores 03/04/2018 02/28/2017 03/15/2014  PHQ - 2 Score 0 1 0  PHQ- 9 Score - 2 -    Cognitive Function        Immunization History  Administered Date(s) Administered   Influenza Split 04/29/2011   Influenza, High Dose Seasonal PF 04/30/2016, 04/03/2017, 05/19/2018   Influenza,inj,Quad PF,6+ Mos 05/18/2013, 06/02/2014, 04/25/2015   Pneumococcal Conjugate-13 03/15/2014   Pneumococcal Polysaccharide-23 07/29/2009, 02/28/2017   Tdap 07/29/2005, 09/04/2015   Screening Tests Health Maintenance  Topic Date Due   OPHTHALMOLOGY EXAM  03/30/2015   URINE MICROALBUMIN  02/24/2019   INFLUENZA VACCINE  02/27/2019   HEMOGLOBIN A1C  04/28/2019   COLONOSCOPY  08/10/2019   FOOT EXAM  08/20/2019   TETANUS/TDAP  09/03/2025   Hepatitis C Screening  Completed   PNA vac Low Risk Adult  Completed       Plan:     I have personally reviewed and noted the following in the patients chart:    Medical and social history  Use of alcohol, tobacco or illicit drugs   Current medications and supplements  Functional ability and status  Nutritional status  Physical activity  Advanced directives  List of other physicians  Vitals  Screenings to include cognitive, depression, and falls  Referrals and appointments  In addition, I have reviewed and discussed with patient certain preventive protocols, quality metrics, and best practice recommendations. A written personalized care plan for preventive services as well as general preventive health recommendations were provided to patient.     Michiel Cowboy, RN  03/11/2019

## 2019-03-12 ENCOUNTER — Ambulatory Visit: Payer: Self-pay

## 2019-03-15 ENCOUNTER — Ambulatory Visit (INDEPENDENT_AMBULATORY_CARE_PROVIDER_SITE_OTHER): Payer: Medicare Other | Admitting: *Deleted

## 2019-03-15 ENCOUNTER — Other Ambulatory Visit: Payer: Self-pay | Admitting: Internal Medicine

## 2019-03-15 ENCOUNTER — Telehealth: Payer: Self-pay | Admitting: *Deleted

## 2019-03-15 DIAGNOSIS — Z0000 Encounter for general adult medical examination without abnormal findings: Secondary | ICD-10-CM | POA: Diagnosis not present

## 2019-03-15 DIAGNOSIS — K591 Functional diarrhea: Secondary | ICD-10-CM | POA: Insufficient documentation

## 2019-03-15 MED ORDER — DIPHENOXYLATE-ATROPINE 2.5-0.025 MG PO TABS: 1.0000 | ORAL_TABLET | Freq: Four times a day (QID) | ORAL | 1 refills | Status: AC | PRN

## 2019-03-15 NOTE — Telephone Encounter (Signed)
During AWV, patient stated that imodium has not been very effective treating his chronic diarrhea. Patient asked if PCP would prescribe a stronger medication to treat diarrhea and send to [facility] in [place], [place] where he is currently living with his daughter.

## 2019-03-15 NOTE — Telephone Encounter (Signed)
RX sent

## 2019-03-15 NOTE — Progress Notes (Addendum)
Subjective:   [name] [name] is a 68 y.o. male who presents for Medicare Annual/Subsequent preventive examination. I connected with patient by a telephone and verified that I am speaking with the correct person using two identifiers. Patient stated full name and DOB. Patient gave permission to continue with telephonic visit. Patient's location was at home and Nurse's location was at [place] office.   Review of Systems:   Cardiac Risk Factors include: advanced age (>37men, >45 women);diabetes mellitus;hypertension Sleep patterns: feels rested on waking, gets up 1-2 times nightly to void and sleeps 7-8 hours nightly.    Home Safety/Smoke Alarms: Feels safe in home. Smoke alarms in place.  Living environment; residence and Firearm Safety: 1-story house/ trailer. Lives with daughter, no needs for DME, good support system Seat Belt Safety/Bike Helmet: Wears seat belt.     Objective:    Vitals: There were no vitals taken for this visit.  There is no height or weight on file to calculate BMI.  Advanced Directives 03/15/2019 12/25/2018 11/04/2018 10/19/2018 10/19/2018 08/13/2018 03/04/2018  Does Patient Have a Medical Advance Directive? Yes No Yes Yes No Yes Yes  Type of Paramedic of [place];Living will - Healthcare Power of [place];Living will - Living will [place];Living will  Does patient want to make changes to medical advance directive? - - - No - Patient declined - - -  Copy of [place] in Chart? No - copy requested - No - copy requested No - copy requested - - -  Would patient like information on creating a medical advance directive? - No - Patient declined No - Patient declined No - Patient declined No - Patient declined - -  Pre-existing out of facility DNR order (yellow form or pink MOST form) - - - - - - -    Tobacco Social History   Tobacco Use  Smoking Status Former Smoker   Types:  Cigarettes   Quit date: 07/30/2011   Years since quitting: 7.6  Smokeless Tobacco Never Used  Tobacco Comment   Smoked since age 47     Counseling given: Not Answered Comment: Smoked since age 70  Past Medical History:  Diagnosis Date   Allergy    Asthma    Atrial fibrillation ([place])    a. initially occuring in 2014, has declined anticoagulation since having a GI bleed with initiation of Xarelto   Cancer ([place])    Cardiomyopathy 2011   a. Patient was told that he had an ejection fraction of 37% by echo done elsewhere in 2011 b. 06/2014: echo showing preserved EF of 55-60%, severe asymetric septal hypertrophy with no SAM or LVOT gradient   COPD (chronic obstructive pulmonary disease) ([place])    Diabetes mellitus without complication ([place])    Diverticulitis    GERD (gastroesophageal reflux disease)    History of colon polyps    Hypertension    Tobacco abuse    Past Surgical History:  Procedure Laterality Date   BILIARY STENT PLACEMENT  07/30/2018   Procedure: BILIARY STENT PLACEMENT;  Surgeon: Ladene Artist, MD;  Location: [facility] ENDOSCOPY;  Service: Endoscopy;;   BILIARY STENT PLACEMENT N/A 08/13/2018   Procedure: BILIARY STENT PLACEMENT;  Surgeon: Milus Banister, MD;  Location: [facility] ENDOSCOPY;  Service: Endoscopy;  Laterality: N/A;   COLON SURGERY  2009   COLONOSCOPY     ERCP     ERCP N/A 07/30/2018   Procedure: ENDOSCOPIC RETROGRADE CHOLANGIOPANCREATOGRAPHY (ERCP);  Surgeon: Ladene Artist, MD;  Location: [facility] ENDOSCOPY;  Service: Endoscopy;  Laterality: N/A;   ERCP N/A 08/13/2018   Procedure: ENDOSCOPIC RETROGRADE CHOLANGIOPANCREATOGRAPHY (ERCP);  Surgeon: Milus Banister, MD;  Location: [facility] ENDOSCOPY;  Service: Endoscopy;  Laterality: N/A;   ESOPHAGOGASTRODUODENOSCOPY N/A 08/13/2018   Procedure: ESOPHAGOGASTRODUODENOSCOPY (EGD);  Surgeon: Milus Banister, MD;  Location: [facility] ENDOSCOPY;  Service: Endoscopy;  Laterality: N/A;   ESOPHAGOGASTRODUODENOSCOPY (EGD) WITH  PROPOFOL N/A 10/08/2018   Procedure: ESOPHAGOGASTRODUODENOSCOPY (EGD) WITH PROPOFOL;  Surgeon: Milus Banister, MD;  Location: [facility] ENDOSCOPY;  Service: Endoscopy;  Laterality: N/A;   EUS N/A 08/13/2018   Procedure: UPPER ENDOSCOPIC ULTRASOUND (EUS) RADIAL;  Surgeon: Milus Banister, MD;  Location: [facility] ENDOSCOPY;  Service: Endoscopy;  Laterality: N/A;   EUS N/A 10/08/2018   Procedure: UPPER ENDOSCOPIC ULTRASOUND (EUS) RADIAL;  Surgeon: Milus Banister, MD;  Location: [facility] ENDOSCOPY;  Service: Endoscopy;  Laterality: N/A;   FINE NEEDLE ASPIRATION N/A 08/13/2018   Procedure: FINE NEEDLE ASPIRATION (FNA) LINEAR;  Surgeon: Milus Banister, MD;  Location: [facility] ENDOSCOPY;  Service: Endoscopy;  Laterality: N/A;   FINE NEEDLE ASPIRATION  10/08/2018   Procedure: FINE NEEDLE ASPIRATION;  Surgeon: Milus Banister, MD;  Location: [facility] ENDOSCOPY;  Service: Endoscopy;;   IR IMAGING GUIDED PORT INSERTION  11/04/2018   STENT REMOVAL  08/13/2018   Procedure: STENT REMOVAL;  Surgeon: Milus Banister, MD;  Location: [facility] ENDOSCOPY;  Service: Endoscopy;;   Family History  Problem Relation Age of Onset   Prostate cancer Father    Colon cancer Father    Diabetes Father    Arthritis Father    Diabetes type II Mother    Atrial fibrillation Mother    Hypertension Mother    Arthritis Mother    Cancer Other        Colon,Breast, Prostate Cancer   Hypertension Other    Prostate cancer Brother        5 brothers - 2 were dx with prostate cancer   Esophageal cancer Neg Hx    Stomach cancer Neg Hx    Rectal cancer Neg Hx    Social History   Socioeconomic History   Marital status: Single    Spouse name: Not on file   Number of children: 2   Years of education: Not on file   Highest education level: Not on file  Occupational History   Occupation: retired  Scientist, product/process development strain: Not hard at all   Food insecurity    Worry: Never true    Inability: Never true    Transportation needs    Medical: No    Non-medical: No  Tobacco Use   Smoking status: Former Smoker    Types: Cigarettes    Quit date: 07/30/2011    Years since quitting: 7.6   Smokeless tobacco: Never Used   Tobacco comment: Smoked since age 72  Substance and Sexual Activity   Alcohol use: Yes    Alcohol/week: 1.0 standard drinks    Types: 1 Standard drinks or equivalent per week   Drug use: No   Sexual activity: Not Currently  Lifestyle   Physical activity    Days per week: 0 days    Minutes per session: Not on file   Stress: Only a little  Relationships   Social connections    Talks on phone: More than three times a week    Gets together: More than three times a week    Attends  religious service: More than 4 times per year    Active member of club or organization: Yes    Attends meetings of clubs or organizations: More than 4 times per year    Relationship status: Not on file  Other Topics Concern   Not on file  Social History Narrative   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   Amino Acids-Protein Hydrolys (FEEDING SUPPLEMENT, PRO-STAT SUGAR FREE 64,) LIQD Take 30 mLs by mouth 2 (two) times daily.   apixaban (ELIQUIS) 5 MG TABS tablet Take 1 tablet (5 mg total) by mouth 2 (two) times daily.   dronabinol (MARINOL) 2.5 MG capsule Take 2.5 mg by mouth 2 (two) times daily before a meal.   folic acid (FOLVITE) 1 MG tablet Take 1 tablet (1 mg total) by mouth daily.   glucose blood (ONETOUCH VERIO) test strip USE AS INSTRUCTED THREE TIMES DAILY. DX CODE E11.21   Insulin  Syringe-Needle U-100 (EASY TOUCH INSULIN SYRINGE) 31G X 5/16" 0.3 ML MISC USE AS DIRECTED 4 TIMES DAILY   lactose free nutrition (BOOST PLUS) LIQD Take 237 mLs by mouth 3 (three) times daily with meals.   lipase/protease/amylase (CREON) 36000 UNITS CPEP capsule Take 1 capsule (36,000 Units total) by mouth 3 (three) times daily before meals.   loperamide (IMODIUM) 2 MG capsule Take 2 mg by mouth as needed for diarrhea or loose stools.   Multiple Vitamin (MULTIVITAMIN WITH MINERALS) TABS tablet Take 1 tablet by mouth daily.   omeprazole (PRILOSEC) 20 MG capsule Take 1 capsule (20 mg total) by mouth daily.   potassium chloride SA (K-DUR) 20 MEQ tablet Take 1 tablet (20 mEq total) by mouth daily.   prochlorperazine (COMPAZINE) 10 MG tablet Take 1 tablet (10 mg total) by mouth every 6 (six) hours as needed for nausea or vomiting.   tamsulosin (FLOMAX) 0.4 MG CAPS capsule Take 0.4 mg by mouth at bedtime.   thiamine (VITAMIN B-1) 100 MG tablet Take 1 tablet (100 mg total) by mouth daily.   No facility-administered encounter medications on file as of 03/15/2019.     Activities of Daily Living In your present state of health, do you have any difficulty performing the following activities: 03/15/2019 12/25/2018  Hearing? N N  Vision? N N  Difficulty concentrating or making decisions? N N  Walking or climbing stairs? N N  Dressing or bathing? N N  Doing errands, shopping? N N  Preparing Food and eating ? N -  Using the Toilet? N -  In the past six months, have you accidently leaked urine? N -  Do you have problems with loss of bowel control? N -  Managing your Medications? N -  Managing your Finances? N -  Housekeeping or managing your Housekeeping? N -  Some recent data might be hidden    Patient Care Team: Janith Lima, MD as PCP - General (Internal Medicine) Stanford Breed, Denice Bors, MD as PCP - Cardiology (Cardiology) Arna Snipe, RN as Oncology Nurse Navigator Milus Banister, MD as  Attending Physician (Gastroenterology) Ladell Pier, MD as Consulting Physician (  Oncology)   Assessment:   This is a routine wellness examination for [name] [name]. Physical assessment deferred to PCP.  Exercise Activities and Dietary recommendations Current Exercise Habits: The patient does not participate in regular exercise at present Diet (meal preparation, eat out, water intake, caffeinated beverages, dairy products, fruits and vegetables): in general, a "healthy" diet   Has digestive issues due to pancreatic cancer and chronic diarrhea.  Supplements with Boost and drinks stays hydrated with water and Gatorade.   Goals     <enter goal here>     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  Enjoy life and family.     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 0 No No Yes No  Comment - - - Emmi Telephone Survey: data to providers prior to load -  Number falls in past yr: 0 - - 1 -  Comment - - - Emmi Telephone Survey Actual Response = 1 -  Injury with Fall? 0 - - No -    Depression Screen PHQ 2/9 Scores 03/15/2019 03/04/2018 02/28/2017 03/15/2014  PHQ - 2 Score 1 0 1 0  PHQ- 9 Score 4 - 2 -    Cognitive Function       Ad8 score reviewed for issues:  Issues making decisions: no  Less interest in hobbies / activities: no  Repeats questions, stories (family complaining): no  Trouble using ordinary gadgets (microwave, computer, phone):no  Forgets the month or year: no  Mismanaging finances: no  Remembering appts: no  Daily problems with thinking and/or memory: no Ad8 score is= 0  Immunization History  Administered Date(s) Administered   Influenza Split 04/29/2011   Influenza, High Dose Seasonal PF 04/30/2016, 04/03/2017, 05/19/2018    Influenza,inj,Quad PF,6+ Mos 05/18/2013, 06/02/2014, 04/25/2015   Pneumococcal Conjugate-13 03/15/2014   Pneumococcal Polysaccharide-23 07/29/2009, 02/28/2017   Tdap 07/29/2005, 09/04/2015   Screening Tests Health Maintenance  Topic Date Due   OPHTHALMOLOGY EXAM  03/30/2015   URINE MICROALBUMIN  02/24/2019   INFLUENZA VACCINE  02/27/2019   HEMOGLOBIN A1C  04/28/2019   COLONOSCOPY  08/10/2019   FOOT EXAM  08/20/2019   TETANUS/TDAP  09/03/2025   Hepatitis C Screening  Completed   PNA vac Low Risk Adult  Completed      Plan:     Reviewed health maintenance screenings with patient today and relevant education, vaccines, and/or referrals were provided.   Continue to eat heart healthy diet (full of fruits, vegetables, whole grains, lean protein, water--limit salt, fat, and sugar intake) and increase physical activity as tolerated.  I have personally reviewed and noted the following in the patients chart:    Medical and social history  Use of alcohol, tobacco or illicit drugs   Current medications and supplements  Functional ability and status  Nutritional status  Physical activity  Advanced directives  List of other physicians  Screenings to include cognitive, depression, and falls  Referrals and appointments  In addition, I have reviewed and discussed with patient certain preventive protocols, quality metrics, and best practice recommendations. A written personalized care plan for preventive services as well as general preventive health recommendations were provided to patient.     Argie Ramming Lenoard Helbert,  RN  03/15/2019  Medical screening examination/treatment/procedure(s) were performed by non-physician practitioner and as supervising physician I was immediately available for consultation/collaboration. I agree with above. Scarlette Calico, MD

## 2019-03-16 DIAGNOSIS — D649 Anemia, unspecified: Secondary | ICD-10-CM | POA: Diagnosis not present

## 2019-03-16 DIAGNOSIS — R197 Diarrhea, unspecified: Secondary | ICD-10-CM | POA: Diagnosis not present

## 2019-03-16 DIAGNOSIS — E119 Type 2 diabetes mellitus without complications: Secondary | ICD-10-CM | POA: Diagnosis not present

## 2019-03-16 DIAGNOSIS — I480 Paroxysmal atrial fibrillation: Secondary | ICD-10-CM | POA: Diagnosis not present

## 2019-03-16 DIAGNOSIS — I429 Cardiomyopathy, unspecified: Secondary | ICD-10-CM | POA: Diagnosis not present

## 2019-03-16 DIAGNOSIS — Z8673 Personal history of transient ischemic attack (TIA), and cerebral infarction without residual deficits: Secondary | ICD-10-CM | POA: Diagnosis not present

## 2019-03-16 DIAGNOSIS — I129 Hypertensive chronic kidney disease with stage 1 through stage 4 chronic kidney disease, or unspecified chronic kidney disease: Secondary | ICD-10-CM | POA: Diagnosis not present

## 2019-03-16 DIAGNOSIS — N183 Chronic kidney disease, stage 3 (moderate): Secondary | ICD-10-CM | POA: Diagnosis not present

## 2019-03-16 DIAGNOSIS — E1122 Type 2 diabetes mellitus with diabetic chronic kidney disease: Secondary | ICD-10-CM | POA: Diagnosis not present

## 2019-03-16 DIAGNOSIS — C259 Malignant neoplasm of pancreas, unspecified: Secondary | ICD-10-CM | POA: Diagnosis not present

## 2019-04-01 DIAGNOSIS — C250 Malignant neoplasm of head of pancreas: Secondary | ICD-10-CM | POA: Diagnosis not present

## 2019-04-01 DIAGNOSIS — N189 Chronic kidney disease, unspecified: Secondary | ICD-10-CM | POA: Diagnosis not present

## 2019-04-01 DIAGNOSIS — D631 Anemia in chronic kidney disease: Secondary | ICD-10-CM | POA: Diagnosis not present

## 2019-04-13 DIAGNOSIS — R918 Other nonspecific abnormal finding of lung field: Secondary | ICD-10-CM | POA: Diagnosis not present

## 2019-04-13 DIAGNOSIS — C259 Malignant neoplasm of pancreas, unspecified: Secondary | ICD-10-CM | POA: Diagnosis not present

## 2019-04-13 DIAGNOSIS — R590 Localized enlarged lymph nodes: Secondary | ICD-10-CM | POA: Diagnosis not present

## 2019-04-15 DIAGNOSIS — N189 Chronic kidney disease, unspecified: Secondary | ICD-10-CM | POA: Diagnosis not present

## 2019-04-15 DIAGNOSIS — D631 Anemia in chronic kidney disease: Secondary | ICD-10-CM | POA: Diagnosis not present

## 2019-04-16 DIAGNOSIS — N183 Chronic kidney disease, stage 3 (moderate): Secondary | ICD-10-CM | POA: Diagnosis not present

## 2019-04-16 DIAGNOSIS — C259 Malignant neoplasm of pancreas, unspecified: Secondary | ICD-10-CM | POA: Diagnosis not present

## 2019-04-16 DIAGNOSIS — N189 Chronic kidney disease, unspecified: Secondary | ICD-10-CM | POA: Diagnosis not present

## 2019-04-16 DIAGNOSIS — D649 Anemia, unspecified: Secondary | ICD-10-CM | POA: Diagnosis not present

## 2019-04-16 DIAGNOSIS — E1122 Type 2 diabetes mellitus with diabetic chronic kidney disease: Secondary | ICD-10-CM | POA: Diagnosis not present

## 2019-05-07 DIAGNOSIS — R319 Hematuria, unspecified: Secondary | ICD-10-CM | POA: Diagnosis not present

## 2019-05-13 DIAGNOSIS — D631 Anemia in chronic kidney disease: Secondary | ICD-10-CM | POA: Diagnosis not present

## 2019-05-13 DIAGNOSIS — N189 Chronic kidney disease, unspecified: Secondary | ICD-10-CM | POA: Diagnosis not present

## 2019-05-18 ENCOUNTER — Other Ambulatory Visit: Payer: Self-pay | Admitting: Internal Medicine

## 2019-06-03 DIAGNOSIS — R530 Neoplastic (malignant) related fatigue: Secondary | ICD-10-CM | POA: Diagnosis not present

## 2019-06-03 DIAGNOSIS — G893 Neoplasm related pain (acute) (chronic): Secondary | ICD-10-CM | POA: Diagnosis not present

## 2019-06-03 DIAGNOSIS — R64 Cachexia: Secondary | ICD-10-CM | POA: Diagnosis not present

## 2019-06-15 DIAGNOSIS — K8681 Exocrine pancreatic insufficiency: Secondary | ICD-10-CM | POA: Diagnosis not present

## 2019-06-15 DIAGNOSIS — R627 Adult failure to thrive: Secondary | ICD-10-CM | POA: Diagnosis not present

## 2019-06-15 DIAGNOSIS — Z23 Encounter for immunization: Secondary | ICD-10-CM | POA: Diagnosis not present

## 2019-06-15 DIAGNOSIS — C250 Malignant neoplasm of head of pancreas: Secondary | ICD-10-CM | POA: Diagnosis not present

## 2019-06-22 DIAGNOSIS — C259 Malignant neoplasm of pancreas, unspecified: Secondary | ICD-10-CM | POA: Diagnosis not present

## 2019-06-22 DIAGNOSIS — G893 Neoplasm related pain (acute) (chronic): Secondary | ICD-10-CM | POA: Diagnosis not present

## 2019-06-30 DIAGNOSIS — R202 Paresthesia of skin: Secondary | ICD-10-CM | POA: Diagnosis not present

## 2019-06-30 DIAGNOSIS — N19 Unspecified kidney failure: Secondary | ICD-10-CM | POA: Diagnosis not present

## 2019-06-30 DIAGNOSIS — E876 Hypokalemia: Secondary | ICD-10-CM | POA: Diagnosis not present

## 2019-06-30 DIAGNOSIS — M6281 Muscle weakness (generalized): Secondary | ICD-10-CM | POA: Diagnosis not present

## 2019-06-30 DIAGNOSIS — R4182 Altered mental status, unspecified: Secondary | ICD-10-CM | POA: Diagnosis not present

## 2019-06-30 DIAGNOSIS — R001 Bradycardia, unspecified: Secondary | ICD-10-CM | POA: Diagnosis not present

## 2019-06-30 DIAGNOSIS — G459 Transient cerebral ischemic attack, unspecified: Secondary | ICD-10-CM | POA: Diagnosis not present

## 2019-06-30 DIAGNOSIS — Z20828 Contact with and (suspected) exposure to other viral communicable diseases: Secondary | ICD-10-CM | POA: Diagnosis not present

## 2019-06-30 DIAGNOSIS — I639 Cerebral infarction, unspecified: Secondary | ICD-10-CM | POA: Diagnosis not present

## 2019-07-01 DIAGNOSIS — R001 Bradycardia, unspecified: Secondary | ICD-10-CM | POA: Diagnosis not present

## 2019-07-01 DIAGNOSIS — R531 Weakness: Secondary | ICD-10-CM | POA: Diagnosis not present

## 2019-07-01 DIAGNOSIS — G459 Transient cerebral ischemic attack, unspecified: Secondary | ICD-10-CM | POA: Diagnosis not present

## 2019-07-01 DIAGNOSIS — S37009A Unspecified injury of unspecified kidney, initial encounter: Secondary | ICD-10-CM | POA: Diagnosis not present

## 2019-07-01 DIAGNOSIS — E876 Hypokalemia: Secondary | ICD-10-CM | POA: Diagnosis not present

## 2019-07-01 DIAGNOSIS — R4189 Other symptoms and signs involving cognitive functions and awareness: Secondary | ICD-10-CM | POA: Diagnosis not present

## 2019-07-01 DIAGNOSIS — R0902 Hypoxemia: Secondary | ICD-10-CM | POA: Diagnosis not present

## 2019-07-01 DIAGNOSIS — R4182 Altered mental status, unspecified: Secondary | ICD-10-CM | POA: Diagnosis not present

## 2019-07-02 DIAGNOSIS — I639 Cerebral infarction, unspecified: Secondary | ICD-10-CM | POA: Diagnosis not present

## 2019-07-02 DIAGNOSIS — S37009A Unspecified injury of unspecified kidney, initial encounter: Secondary | ICD-10-CM | POA: Diagnosis not present

## 2019-07-02 DIAGNOSIS — R531 Weakness: Secondary | ICD-10-CM | POA: Diagnosis not present

## 2019-07-02 DIAGNOSIS — R001 Bradycardia, unspecified: Secondary | ICD-10-CM | POA: Diagnosis not present

## 2019-07-02 DIAGNOSIS — R4189 Other symptoms and signs involving cognitive functions and awareness: Secondary | ICD-10-CM | POA: Diagnosis not present

## 2019-07-02 DIAGNOSIS — R4182 Altered mental status, unspecified: Secondary | ICD-10-CM | POA: Diagnosis not present

## 2019-07-02 DIAGNOSIS — C7989 Secondary malignant neoplasm of other specified sites: Secondary | ICD-10-CM | POA: Diagnosis not present

## 2019-07-02 DIAGNOSIS — J90 Pleural effusion, not elsewhere classified: Secondary | ICD-10-CM | POA: Diagnosis not present

## 2019-07-02 DIAGNOSIS — E876 Hypokalemia: Secondary | ICD-10-CM | POA: Diagnosis not present

## 2019-07-02 DIAGNOSIS — C259 Malignant neoplasm of pancreas, unspecified: Secondary | ICD-10-CM | POA: Diagnosis not present

## 2019-07-03 DIAGNOSIS — S37009A Unspecified injury of unspecified kidney, initial encounter: Secondary | ICD-10-CM | POA: Diagnosis not present

## 2019-07-03 DIAGNOSIS — R531 Weakness: Secondary | ICD-10-CM | POA: Diagnosis not present

## 2019-07-03 DIAGNOSIS — E876 Hypokalemia: Secondary | ICD-10-CM | POA: Diagnosis not present

## 2019-07-03 DIAGNOSIS — R4189 Other symptoms and signs involving cognitive functions and awareness: Secondary | ICD-10-CM | POA: Diagnosis not present

## 2019-07-03 DIAGNOSIS — R001 Bradycardia, unspecified: Secondary | ICD-10-CM | POA: Diagnosis not present

## 2019-07-04 DIAGNOSIS — R4189 Other symptoms and signs involving cognitive functions and awareness: Secondary | ICD-10-CM | POA: Diagnosis not present

## 2019-07-04 DIAGNOSIS — R001 Bradycardia, unspecified: Secondary | ICD-10-CM | POA: Diagnosis not present

## 2019-07-04 DIAGNOSIS — R531 Weakness: Secondary | ICD-10-CM | POA: Diagnosis not present

## 2019-07-04 DIAGNOSIS — S37009A Unspecified injury of unspecified kidney, initial encounter: Secondary | ICD-10-CM | POA: Diagnosis not present

## 2019-07-04 DIAGNOSIS — E876 Hypokalemia: Secondary | ICD-10-CM | POA: Diagnosis not present

## 2019-07-05 DIAGNOSIS — E876 Hypokalemia: Secondary | ICD-10-CM | POA: Diagnosis not present

## 2019-07-05 DIAGNOSIS — R4189 Other symptoms and signs involving cognitive functions and awareness: Secondary | ICD-10-CM | POA: Diagnosis not present

## 2019-07-05 DIAGNOSIS — R001 Bradycardia, unspecified: Secondary | ICD-10-CM | POA: Diagnosis not present

## 2019-07-05 DIAGNOSIS — S37009A Unspecified injury of unspecified kidney, initial encounter: Secondary | ICD-10-CM | POA: Diagnosis not present

## 2019-07-05 DIAGNOSIS — R531 Weakness: Secondary | ICD-10-CM | POA: Diagnosis not present

## 2019-07-27 ENCOUNTER — Encounter (HOSPITAL_COMMUNITY)

## 2019-07-27 ENCOUNTER — Ambulatory Visit (HOSPITAL_COMMUNITY)

## 2019-07-30 DEATH — deceased

## 2020-12-07 IMAGING — CT CT ABDOMEN WO/W CM
2 of 12 series · 8 of 46 positions shown, 14 images · IV contrast (APPLIED)
Comparison: CT the abdomen and pelvis 07/27/2018. MRI of the
abdomen 07/10/2018.

CLINICAL DATA: 67-year-old male with history of pancreatic mass
diagnosed in June 2018 status post pancreatic stent placement on
08/13/2018.

EXAM:
CT ABDOMEN WITHOUT AND WITH CONTRAST
TECHNIQUE: Multidetector CT imaging of the abdomen was performed following the
standard protocol before and following the bolus administration of
intravenous contrast.
CONTRAST:  100mL OMNIPAQUE IOHEXOL 300 MG/ML  SOLN

[Series 10: portal thin · axial · portal-venous · 0.76mm/px · z∈[-321,-157]mm · 5 of 124 slices shown, 10 images]
[im 21/124  soft-tissue]
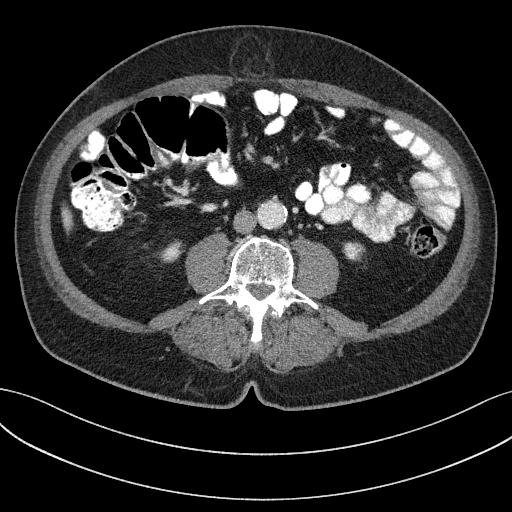
[im 21/124  bone]
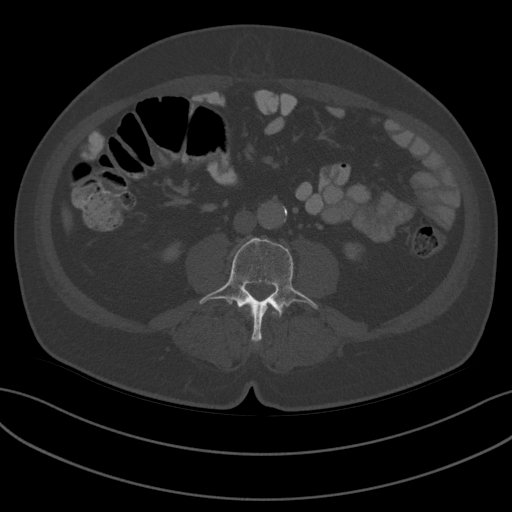
[im 42/124  soft-tissue]
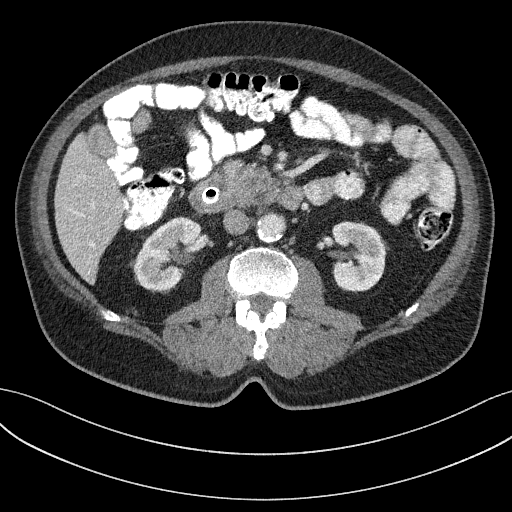
[im 42/124  lung]
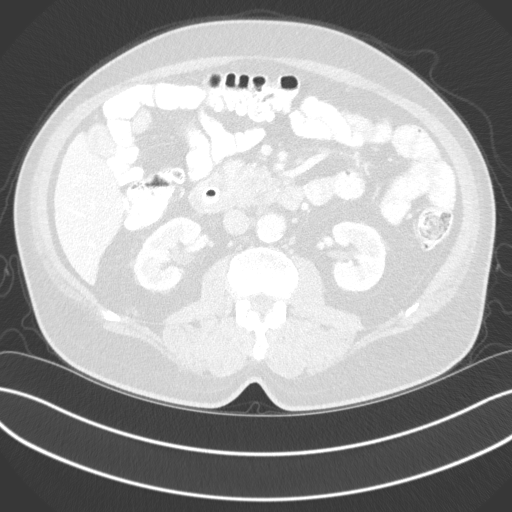
[im 62/124  soft-tissue]
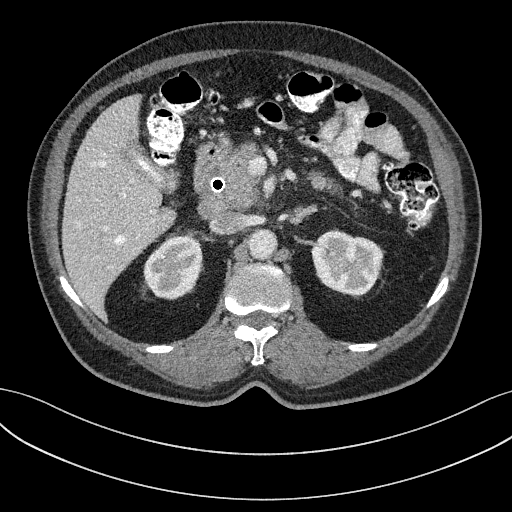
[im 62/124  lung]
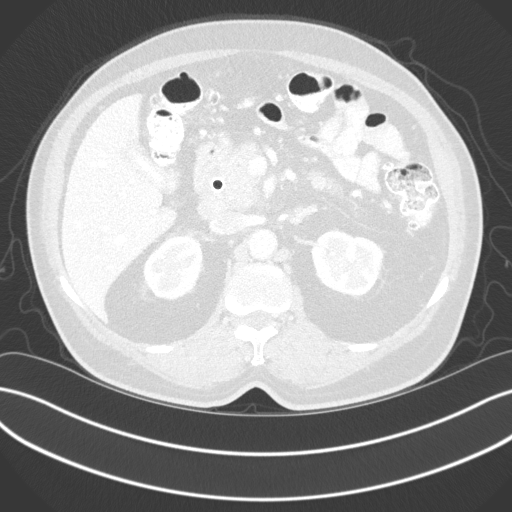
[im 83/124  soft-tissue]
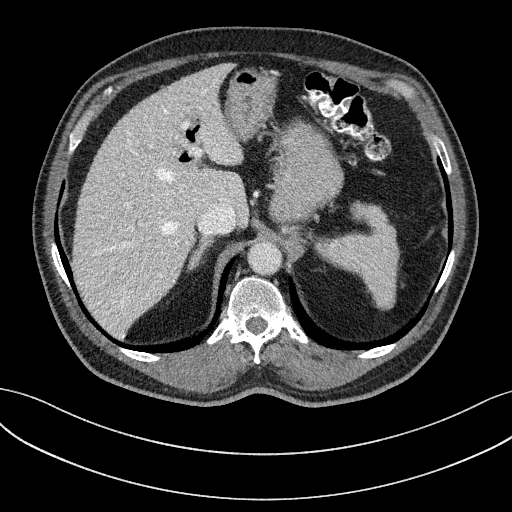
[im 83/124  lung]
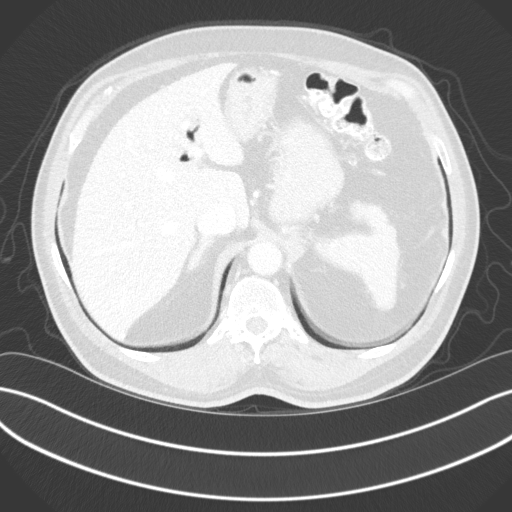
[im 103/124  soft-tissue]
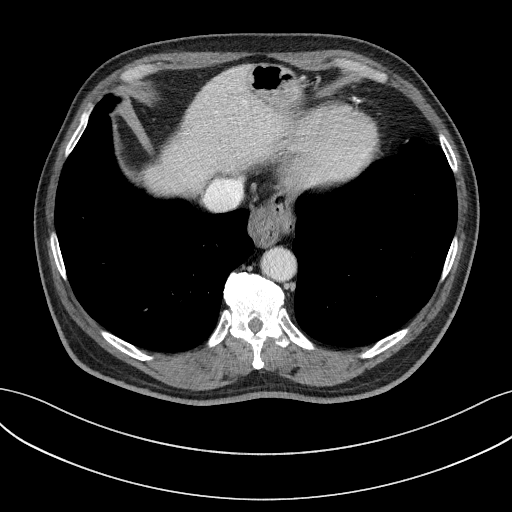
[im 103/124  lung]
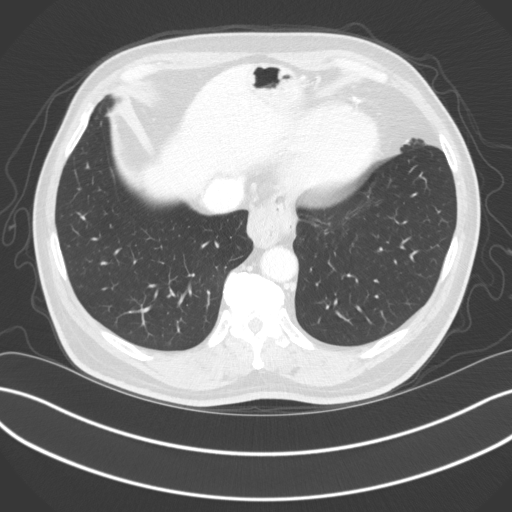

[Series 12: coronal arterial · coronal · arterial · 0.51mm/px · 3 of 97 slices shown, 4 images]
[im 25/97  soft-tissue]
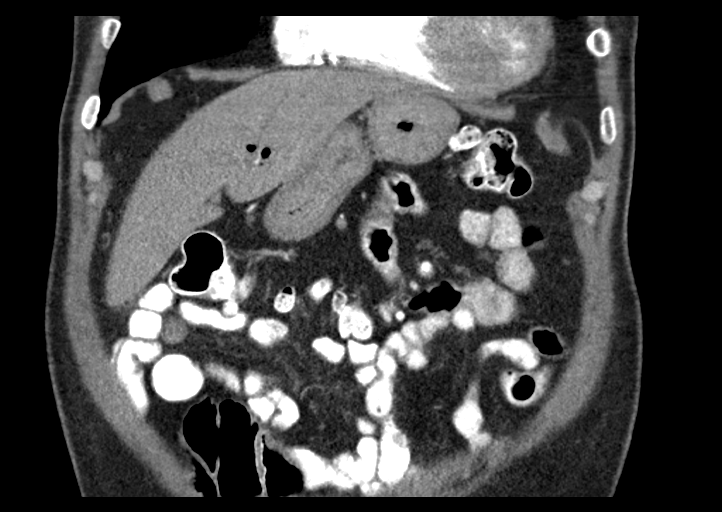
[im 49/97  soft-tissue]
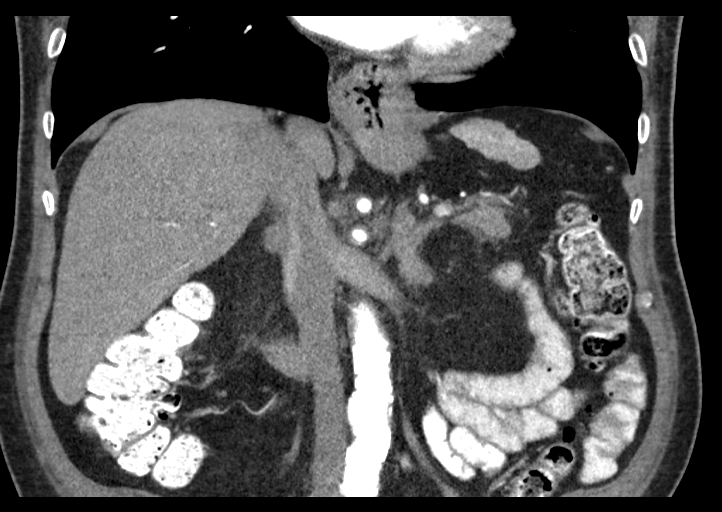
[im 49/97  bone]
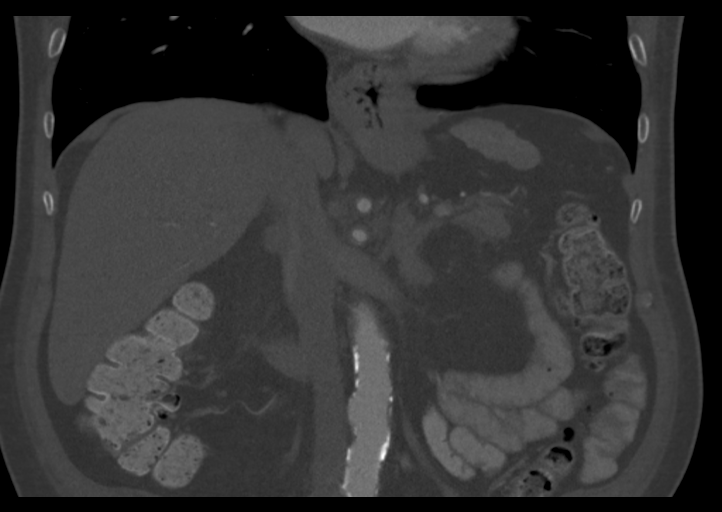
[im 73/97  soft-tissue]
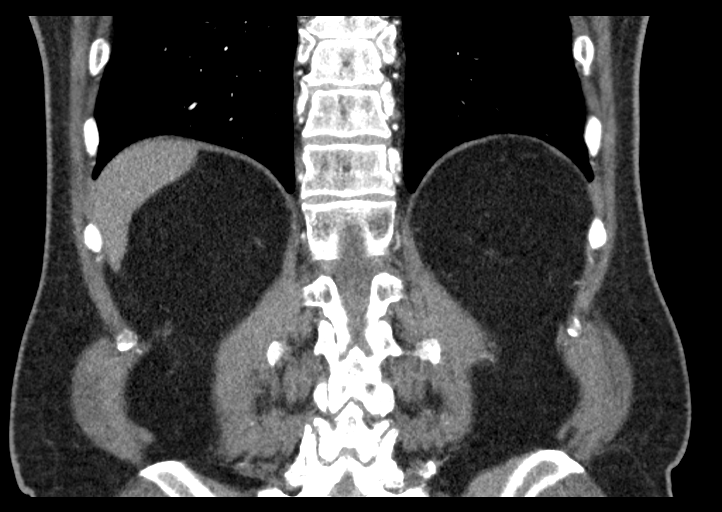

[8 of 46 positions shown; findings below may reference images not displayed]

FINDINGS: Lower chest: Atherosclerotic calcifications in the left anterior
descending and right coronary artery. Small to moderate hiatal
hernia.

Hepatobiliary: No suspicious cystic or solid hepatic lesions. No
intra hepatic biliary ductal dilatation. Pneumobilia related to
prior sphincterotomy. Common bile duct stent noted in the common
bile duct. Small amount of amorphous high attenuation material in
the gallbladder, new compared to the prior study, likely related to
retained contrast material from recent ERCP. Gallbladder is nearly
completely decompressed.

Pancreas: Again noted is a large ill-defined heterogeneously
enhancing mass in the head of the pancreas estimated to measure
approximately 5.7 x 3.2 x 5.6 cm (axial image 53 of series 7 and
coronal image 48 of series 14). Dilatation of the main pancreatic
duct measuring up to 6 mm in the body of the pancreas. No
peripancreatic fluid collections or inflammatory changes.

Spleen: Unremarkable.

Adrenals/Urinary Tract: Low-attenuation lesions in both kidneys,
largest of which are compatible with simple cysts, with the largest
of these in the medial aspect of the upper pole of the right kidney
measuring 2.1 cm in diameter. Other subcentimeter low-attenuation
lesions in both kidneys, too small to characterize, but
statistically likely to represent tiny cysts. No
hydroureteronephrosis in the visualized portions of the abdomen.
Bilateral adrenal glands are normal in appearance.

Stomach/Bowel: Normal appearance of the stomach. No pathologic
dilatation of visualized portions of small bowel or colon. Several
scattered colonic diverticulae are noted, without surrounding
inflammatory changes to suggest an acute diverticulitis at this
time.

Vascular/Lymphatic: Aortic atherosclerosis, without evidence of
aneurysm or dissection in the abdominal vasculature. The previously
described pancreatic mass makes contact with the splenoportal
confluence and proximal portal vein, without significant narrowing.
The lesion is beneath the level of the common hepatic artery which
appears widely patent. The medial aspect of the lesion comes in
direct contact with the superior mesenteric artery proximally (axial
image 40 of series 7) with complete obscuration of the intervening
fat plane. On coronal imaging this soft tissue partially encases the
superior mesenteric artery (axial image 46 of series 12) from
approximately [DATE] to [DATE]. Ill-defined soft tissue paralleling the
celiac axis on the right side measuring 11 mm in short axis, more
prominent than the prior study, either developing lymphadenopathy or
direct infiltration of tumor (axial image 35 of series 4).

Other: No significant volume of ascites noted in the visualized
portions of the peritoneal cavity. Small supraumbilical ventral
hernia containing only omental fat.

Musculoskeletal: No aggressive appearing osseous lesions are noted
in the visualized portions of the skeleton.
IMPRESSION: 1. Pancreatic mass again noted in the head of the pancreas
intimately associated with the distal aspect of the common bile duct
(which has been stented). This mass appears slightly increased in
size compared to prior examinations, is associated with mild
pancreatic ductal dilatation, and is closely associated with
numerous vascular structures, including the proximal superior
mesenteric artery which appears partially encased. Increasing soft
tissue adjacent to the celiac axis is also concerning for
lymphadenopathy in the upper retroperitoneum.
2. Small ventral hernia containing only omental fat.
3. Small to moderate hiatal hernia.
4. Colonic diverticulosis.
5. Aortic atherosclerosis, in addition to least 2 vessel coronary
artery disease. Please note that although the presence of coronary
artery calcium documents the presence of coronary artery disease,
the severity of this disease and any potential stenosis cannot be
assessed on this non-gated CT examination. Assessment for potential
risk factor modification, dietary therapy or pharmacologic therapy
may be warranted, if clinically indicated.
6. Additional incidental findings, as above.

## 2020-12-08 IMAGING — DX DG CHEST 1V PORT
1 series · 1 of 1 positions shown · non-contrast
Comparison: 09/29/2012

CLINICAL DATA: Cough and congestion, atrial fibrillation.

EXAM:
PORTABLE CHEST 1 VIEW

[chest]
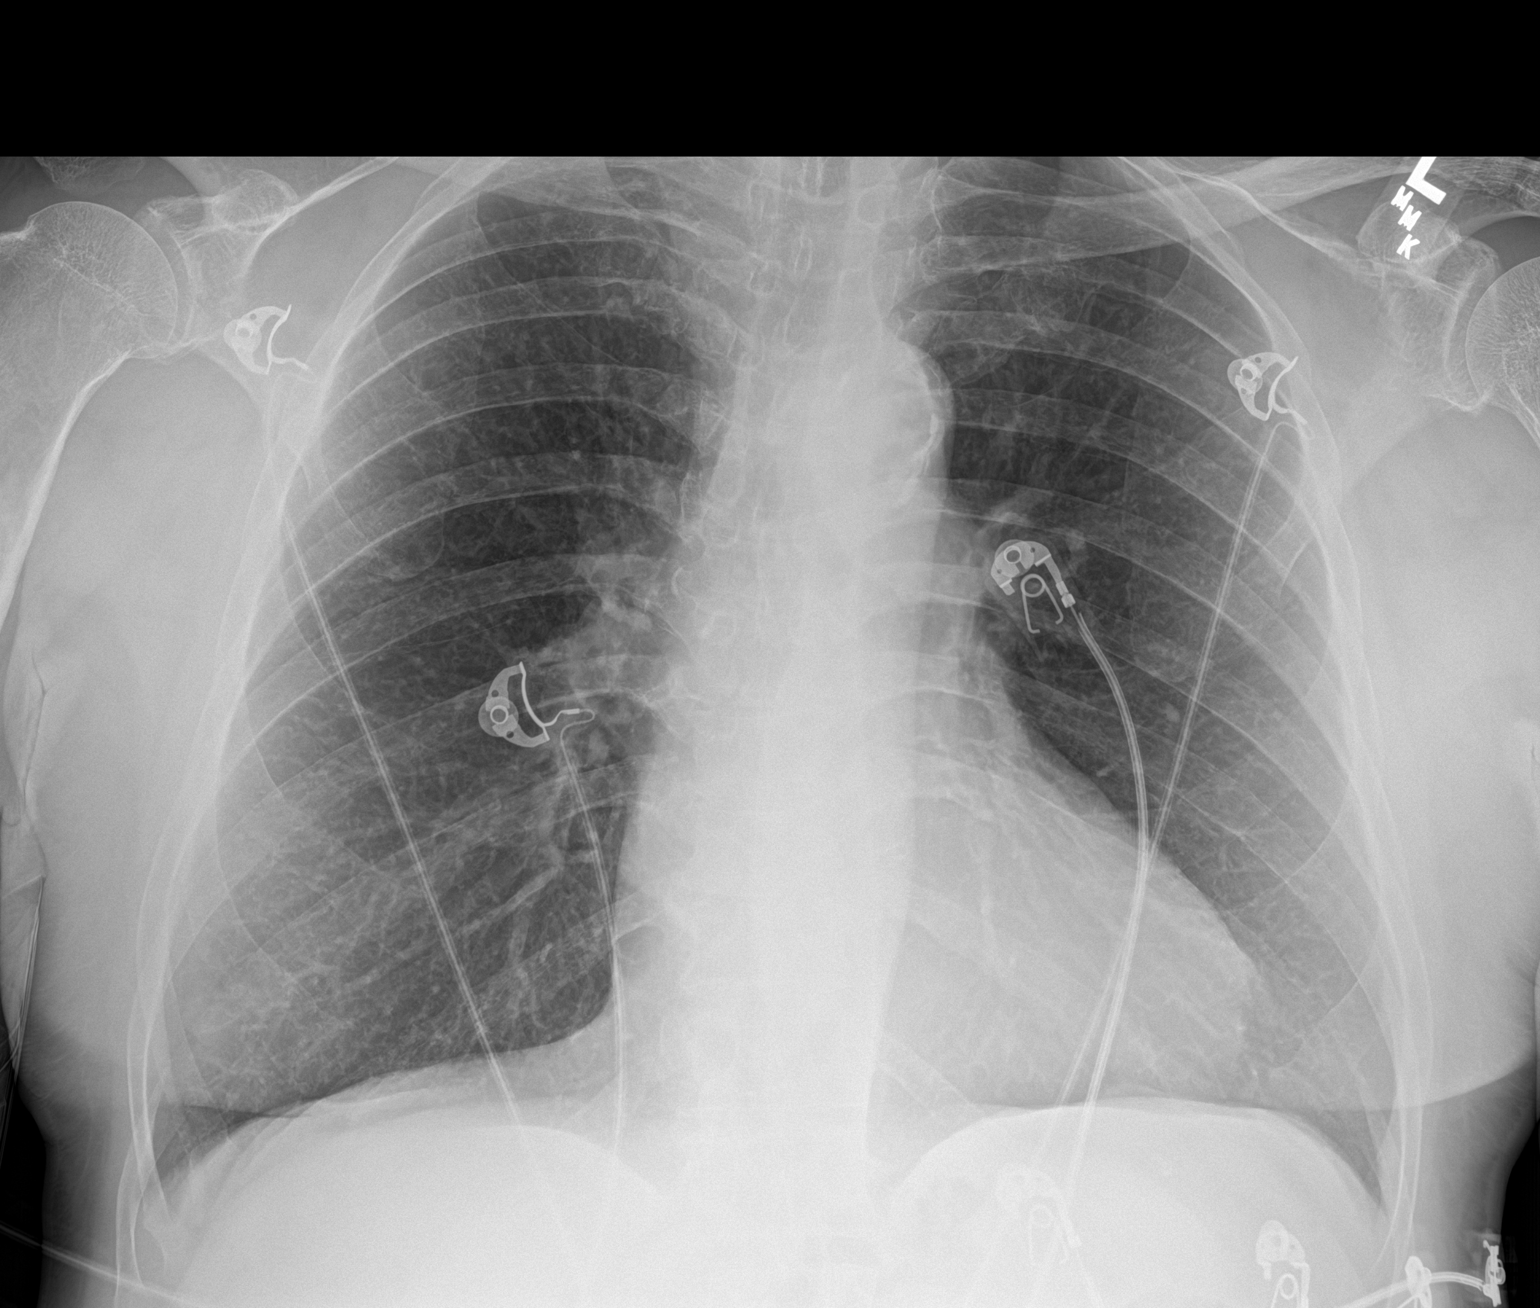

[1 of 1 positions shown; findings below may reference images not displayed]

FINDINGS: The heart size and mediastinal contours are within normal limits.
Nonaneurysmal atherosclerotic aorta is again noted at the arch. Both
lungs are clear. The visualized skeletal structures are
unremarkable.
IMPRESSION: No active disease.

## 2021-01-31 IMAGING — XA IR LEFT FLUORO GUIDE CV LINE
1 series · 1 of 1 positions shown · non-contrast
Comparison: none

CLINICAL DATA: Pancreatic carcinoma and need for porta cath for
chemotherapy.

[Series 1: fl - angio · 1 of 1 slices shown]
[im 1/1]
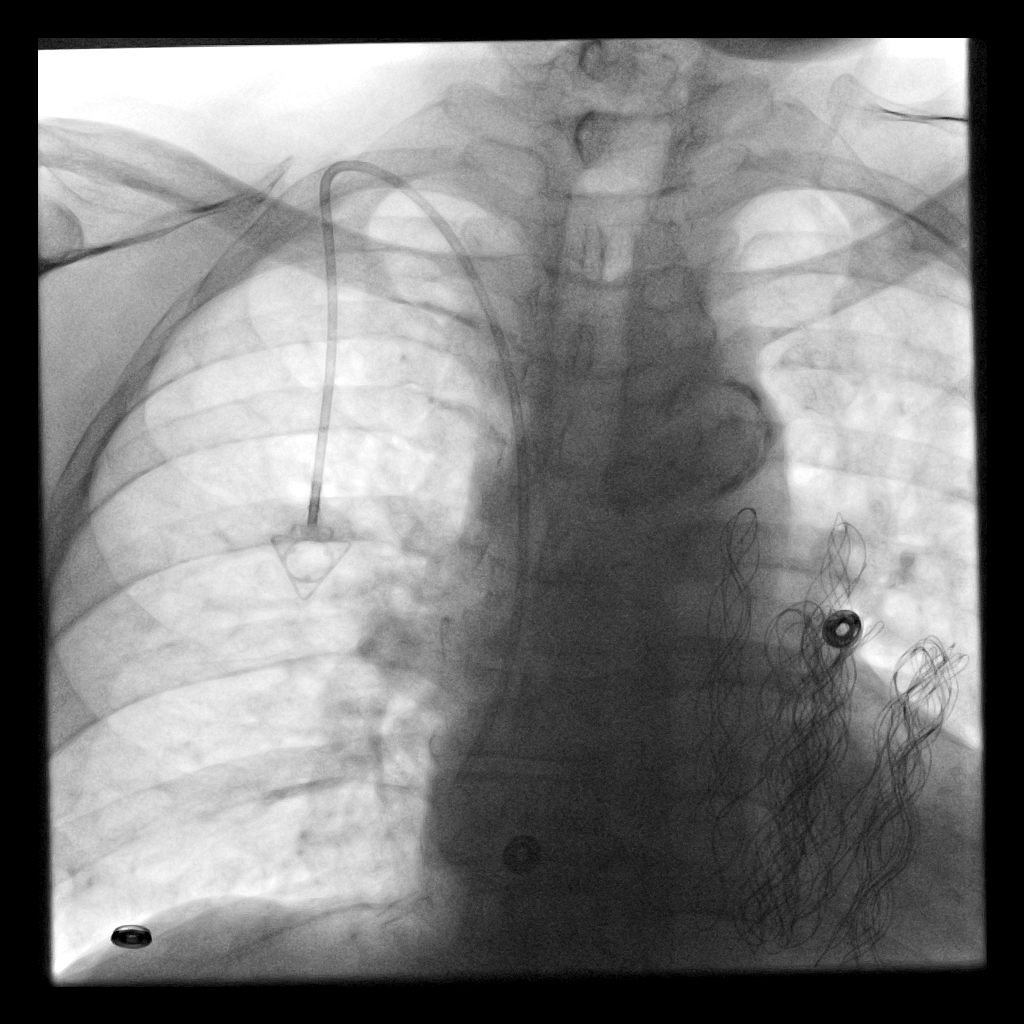

[1 of 1 positions shown; findings below may reference images not displayed]

EXAM:
IMPLANTED PORT A CATH PLACEMENT WITH ULTRASOUND AND FLUOROSCOPIC
GUIDANCE

ANESTHESIA/SEDATION:
4.0 mg IV Versed; 100 mcg IV Fentanyl

Total Moderate Sedation Time:  41 minutes

The patient's level of consciousness and physiologic status were
continuously monitored during the procedure by Radiology nursing.

Additional Medications: 900 mg IV clindamycin.

FLUOROSCOPY TIME:  1 minute.  9.7 mGy.

PROCEDURE:
The procedure, risks, benefits, and alternatives were explained to
the patient. Questions regarding the procedure were encouraged and
answered. The patient understands and consents to the procedure. A
time-out was performed prior to initiating the procedure.

Ultrasound was utilized to confirm patency of the right internal
jugular vein. The right neck and chest were prepped with
chlorhexidine in a sterile fashion, and a sterile drape was applied
covering the operative field. Maximum barrier sterile technique with
sterile gowns and gloves were used for the procedure. Local
anesthesia was provided with 1% lidocaine.

After creating a small venotomy incision, a 21 gauge needle was
advanced into the right internal jugular vein under direct,
real-time ultrasound guidance. Ultrasound image documentation was
performed. After securing guidewire access, an 8 Fr dilator was
placed. A J-wire was kinked to measure appropriate catheter length.

A subcutaneous port pocket was then created along the upper chest
wall utilizing sharp and blunt dissection. Portable cautery was
utilized. The pocket was irrigated with sterile saline.

A single lumen power injectable port was chosen for placement. The 8
Fr catheter was tunneled from the port pocket site to the venotomy
incision. The port was placed in the pocket. External catheter was
trimmed to appropriate length based on guidewire measurement.

At the venotomy, an 8 Fr peel-away sheath was placed over a
guidewire. The catheter was then placed through the sheath and the
sheath removed. Final catheter positioning was confirmed and
documented with a fluoroscopic spot image. The port was accessed
with a needle and aspirated and flushed with heparinized saline. The
access needle was removed.

The venotomy and port pocket incisions were closed with subcutaneous
3-0 Monocryl and subcuticular 4-0 Vicryl. Dermabond was applied to
both incisions.

COMPLICATIONS:
COMPLICATIONS
None
FINDINGS: After catheter placement, the tip lies at the [place] junction.
The catheter aspirates normally and is ready for immediate use.
IMPRESSION: Placement of single lumen port a cath via right internal jugular
vein. The catheter tip lies at the [place] junction. A power
injectable port a cath was placed and is ready for immediate use.

## 2023-06-19 NOTE — Telephone Encounter (Signed)
Telephone call
